# Patient Record
Sex: Male | Born: 1945 | Race: White | Hispanic: No | Marital: Married | Smoking: Former smoker
Health system: Southern US, Community
[De-identification: ages and names within clinical notes are randomized; demographics above are authoritative.]

## PROBLEM LIST (undated history)

## (undated) ENCOUNTER — Emergency Department (HOSPITAL_COMMUNITY): Payer: Medicare PPO

## (undated) DIAGNOSIS — C439 Malignant melanoma of skin, unspecified: Secondary | ICD-10-CM

## (undated) DIAGNOSIS — I10 Essential (primary) hypertension: Secondary | ICD-10-CM

## (undated) DIAGNOSIS — T7840XA Allergy, unspecified, initial encounter: Secondary | ICD-10-CM

## (undated) DIAGNOSIS — H409 Unspecified glaucoma: Secondary | ICD-10-CM

## (undated) HISTORY — PX: ESOPHAGOGASTRODUODENOSCOPY: SHX1529

## (undated) HISTORY — DX: Malignant melanoma of skin, unspecified: C43.9

## (undated) HISTORY — DX: Unspecified glaucoma: H40.9

## (undated) HISTORY — DX: Essential (primary) hypertension: I10

## (undated) HISTORY — PX: CATARACT EXTRACTION: SUR2

## (undated) HISTORY — DX: Allergy, unspecified, initial encounter: T78.40XA

---

## 2001-06-17 ENCOUNTER — Emergency Department (HOSPITAL_COMMUNITY): Admission: EM | Admit: 2001-06-17 | Discharge: 2001-06-18 | Payer: Self-pay

## 2003-08-12 ENCOUNTER — Encounter: Payer: Self-pay | Admitting: Gastroenterology

## 2003-08-12 ENCOUNTER — Ambulatory Visit (HOSPITAL_COMMUNITY): Admission: RE | Admit: 2003-08-12 | Discharge: 2003-08-12 | Payer: Self-pay | Admitting: Gastroenterology

## 2005-12-14 ENCOUNTER — Ambulatory Visit: Payer: Self-pay | Admitting: Gastroenterology

## 2005-12-26 ENCOUNTER — Ambulatory Visit: Payer: Self-pay | Admitting: Gastroenterology

## 2006-08-25 ENCOUNTER — Encounter: Payer: Self-pay | Admitting: Family Medicine

## 2007-06-07 ENCOUNTER — Ambulatory Visit: Payer: Self-pay | Admitting: Family Medicine

## 2007-06-07 DIAGNOSIS — I10 Essential (primary) hypertension: Secondary | ICD-10-CM | POA: Insufficient documentation

## 2007-06-07 DIAGNOSIS — J45909 Unspecified asthma, uncomplicated: Secondary | ICD-10-CM | POA: Insufficient documentation

## 2007-06-07 DIAGNOSIS — J309 Allergic rhinitis, unspecified: Secondary | ICD-10-CM | POA: Insufficient documentation

## 2007-08-22 ENCOUNTER — Ambulatory Visit: Payer: Self-pay | Admitting: Family Medicine

## 2007-08-22 LAB — CONVERTED CEMR LAB
Bilirubin Urine: NEGATIVE
Blood in Urine, dipstick: NEGATIVE
Glucose, Urine, Semiquant: NEGATIVE
Ketones, urine, test strip: NEGATIVE
Nitrite: NEGATIVE
Protein, U semiquant: NEGATIVE
Specific Gravity, Urine: 1.02
Urobilinogen, UA: 0.2
WBC Urine, dipstick: NEGATIVE
pH: 5.5

## 2007-08-24 LAB — CONVERTED CEMR LAB
ALT: 17 units/L (ref 0–53)
AST: 21 units/L (ref 0–37)
Albumin: 4 g/dL (ref 3.5–5.2)
Alkaline Phosphatase: 63 units/L (ref 39–117)
BUN: 18 mg/dL (ref 6–23)
Basophils Absolute: 0 10*3/uL (ref 0.0–0.1)
Basophils Relative: 0.5 % (ref 0.0–1.0)
Bilirubin, Direct: 0.2 mg/dL (ref 0.0–0.3)
CO2: 28 meq/L (ref 19–32)
Calcium: 9.6 mg/dL (ref 8.4–10.5)
Chloride: 108 meq/L (ref 96–112)
Cholesterol: 194 mg/dL (ref 0–200)
Creatinine, Ser: 0.8 mg/dL (ref 0.4–1.5)
Eosinophils Absolute: 0.2 10*3/uL (ref 0.0–0.6)
Eosinophils Relative: 4.2 % (ref 0.0–5.0)
GFR calc Af Amer: 126 mL/min
GFR calc non Af Amer: 104 mL/min
Glucose, Bld: 101 mg/dL — ABNORMAL HIGH (ref 70–99)
HCT: 41.7 % (ref 39.0–52.0)
HDL: 55.1 mg/dL (ref >39.0)
Hemoglobin: 14.3 g/dL (ref 13.0–17.0)
LDL Cholesterol: 129 mg/dL — ABNORMAL HIGH (ref 0–99)
Lymphocytes Relative: 32.1 % (ref 12.0–46.0)
MCHC: 34.4 g/dL (ref 30.0–36.0)
MCV: 93.4 fL (ref 78.0–100.0)
Monocytes Absolute: 0.6 10*3/uL (ref 0.2–0.7)
Monocytes Relative: 9.7 % (ref 3.0–11.0)
Neutro Abs: 3.1 10*3/uL (ref 1.4–7.7)
Neutrophils Relative %: 53.5 % (ref 43.0–77.0)
PSA: 0.19 ng/mL (ref 0.10–4.00)
Platelets: 185 10*3/uL (ref 150–400)
Potassium: 5.2 meq/L — ABNORMAL HIGH (ref 3.5–5.1)
RBC: 4.46 M/uL (ref 4.22–5.81)
RDW: 12.3 % (ref 11.5–14.6)
Sodium: 143 meq/L (ref 135–145)
TSH: 2.66 microintl units/mL (ref 0.35–5.50)
Total Bilirubin: 1 mg/dL (ref 0.3–1.2)
Total CHOL/HDL Ratio: 3.5
Total Protein: 6.3 g/dL (ref 6.0–8.3)
Triglycerides: 51 mg/dL (ref 0–149)
VLDL: 10 mg/dL (ref 0–40)
WBC: 5.8 10*3/uL (ref 4.5–10.5)

## 2007-09-19 ENCOUNTER — Ambulatory Visit: Payer: Self-pay | Admitting: Family Medicine

## 2008-03-06 ENCOUNTER — Ambulatory Visit: Payer: Self-pay | Admitting: Family Medicine

## 2008-03-06 DIAGNOSIS — IMO0002 Reserved for concepts with insufficient information to code with codable children: Secondary | ICD-10-CM | POA: Insufficient documentation

## 2008-03-12 ENCOUNTER — Telehealth: Payer: Self-pay | Admitting: Family Medicine

## 2008-03-26 ENCOUNTER — Ambulatory Visit: Payer: Self-pay | Admitting: Family Medicine

## 2008-03-26 ENCOUNTER — Telehealth: Payer: Self-pay | Admitting: Family Medicine

## 2008-09-18 ENCOUNTER — Ambulatory Visit: Payer: Self-pay | Admitting: Family Medicine

## 2009-01-21 ENCOUNTER — Telehealth: Payer: Self-pay | Admitting: Family Medicine

## 2009-06-23 ENCOUNTER — Ambulatory Visit: Payer: Self-pay | Admitting: Family Medicine

## 2009-06-23 DIAGNOSIS — S7000XA Contusion of unspecified hip, initial encounter: Secondary | ICD-10-CM

## 2009-06-23 DIAGNOSIS — IMO0002 Reserved for concepts with insufficient information to code with codable children: Secondary | ICD-10-CM | POA: Insufficient documentation

## 2009-09-14 ENCOUNTER — Ambulatory Visit: Payer: Self-pay | Admitting: Family Medicine

## 2009-09-14 LAB — CONVERTED CEMR LAB
Protein, U semiquant: NEGATIVE
Specific Gravity, Urine: 1.01
WBC Urine, dipstick: NEGATIVE

## 2009-09-16 LAB — CONVERTED CEMR LAB
ALT: 19 units/L (ref 0–53)
AST: 25 units/L (ref 0–37)
Albumin: 4.3 g/dL (ref 3.5–5.2)
Eosinophils Relative: 3.3 % (ref 0.0–5.0)
GFR calc non Af Amer: 103.64 mL/min (ref >60)
Glucose, Bld: 103 mg/dL — ABNORMAL HIGH (ref 70–99)
HCT: 44 % (ref 39.0–52.0)
Hemoglobin: 14.7 g/dL (ref 13.0–17.0)
LDL Cholesterol: 122 mg/dL — ABNORMAL HIGH (ref 0–99)
Lymphs Abs: 1.3 10*3/uL (ref 0.7–4.0)
Monocytes Relative: 10.4 % (ref 3.0–12.0)
Neutro Abs: 2.7 10*3/uL (ref 1.4–7.7)
Potassium: 4.3 meq/L (ref 3.5–5.1)
Sodium: 142 meq/L (ref 135–145)
TSH: 2.39 microintl units/mL (ref 0.35–5.50)
VLDL: 8.6 mg/dL (ref 0.0–40.0)
WBC: 4.7 10*3/uL (ref 4.5–10.5)

## 2009-09-18 ENCOUNTER — Ambulatory Visit: Payer: Self-pay | Admitting: Family Medicine

## 2009-09-18 ENCOUNTER — Telehealth: Payer: Self-pay | Admitting: Family Medicine

## 2009-11-11 ENCOUNTER — Ambulatory Visit: Payer: Self-pay | Admitting: Family Medicine

## 2009-11-11 DIAGNOSIS — L989 Disorder of the skin and subcutaneous tissue, unspecified: Secondary | ICD-10-CM | POA: Insufficient documentation

## 2009-11-13 ENCOUNTER — Telehealth: Payer: Self-pay | Admitting: Family Medicine

## 2009-11-30 ENCOUNTER — Ambulatory Visit: Payer: Self-pay | Admitting: Family Medicine

## 2009-11-30 DIAGNOSIS — C4370 Malignant melanoma of unspecified lower limb, including hip: Secondary | ICD-10-CM | POA: Insufficient documentation

## 2009-12-07 ENCOUNTER — Ambulatory Visit (HOSPITAL_COMMUNITY): Admission: RE | Admit: 2009-12-07 | Discharge: 2009-12-07 | Payer: Self-pay | Admitting: General Surgery

## 2009-12-07 ENCOUNTER — Encounter: Admission: RE | Admit: 2009-12-07 | Discharge: 2009-12-07 | Payer: Self-pay | Admitting: General Surgery

## 2009-12-08 ENCOUNTER — Ambulatory Visit (HOSPITAL_BASED_OUTPATIENT_CLINIC_OR_DEPARTMENT_OTHER): Admission: RE | Admit: 2009-12-08 | Discharge: 2009-12-08 | Payer: Self-pay | Admitting: General Surgery

## 2009-12-08 ENCOUNTER — Encounter: Payer: Self-pay | Admitting: Family Medicine

## 2009-12-08 HISTORY — PX: OTHER SURGICAL HISTORY: SHX169

## 2009-12-14 ENCOUNTER — Ambulatory Visit: Payer: Self-pay | Admitting: Oncology

## 2009-12-17 ENCOUNTER — Encounter: Payer: Self-pay | Admitting: Family Medicine

## 2010-01-21 ENCOUNTER — Ambulatory Visit (HOSPITAL_COMMUNITY): Admission: RE | Admit: 2010-01-21 | Discharge: 2010-01-21 | Payer: Self-pay | Admitting: General Surgery

## 2010-01-21 ENCOUNTER — Ambulatory Visit (HOSPITAL_BASED_OUTPATIENT_CLINIC_OR_DEPARTMENT_OTHER): Admission: RE | Admit: 2010-01-21 | Discharge: 2010-01-21 | Payer: Self-pay | Admitting: General Surgery

## 2010-01-21 HISTORY — PX: OTHER SURGICAL HISTORY: SHX169

## 2010-01-27 ENCOUNTER — Ambulatory Visit: Payer: Self-pay | Admitting: Family Medicine

## 2010-01-27 DIAGNOSIS — C4359 Malignant melanoma of other part of trunk: Secondary | ICD-10-CM | POA: Insufficient documentation

## 2010-02-16 ENCOUNTER — Ambulatory Visit: Payer: Self-pay | Admitting: Oncology

## 2010-02-18 ENCOUNTER — Encounter: Payer: Self-pay | Admitting: Family Medicine

## 2010-08-17 ENCOUNTER — Ambulatory Visit: Payer: Self-pay | Admitting: Oncology

## 2010-08-19 ENCOUNTER — Encounter: Payer: Self-pay | Admitting: Family Medicine

## 2010-09-14 ENCOUNTER — Ambulatory Visit: Payer: Self-pay | Admitting: Family Medicine

## 2010-09-14 LAB — CONVERTED CEMR LAB
Ketones, urine, test strip: NEGATIVE
Nitrite: NEGATIVE
Urobilinogen, UA: 0.2

## 2010-09-15 LAB — CONVERTED CEMR LAB
ALT: 19 units/L (ref 0–53)
AST: 23 units/L (ref 0–37)
Alkaline Phosphatase: 67 units/L (ref 39–117)
Basophils Relative: 0.8 % (ref 0.0–3.0)
Bilirubin, Direct: 0.1 mg/dL (ref 0.0–0.3)
Chloride: 101 meq/L (ref 96–112)
Creatinine, Ser: 0.9 mg/dL (ref 0.4–1.5)
Eosinophils Relative: 3.8 % (ref 0.0–5.0)
GFR calc non Af Amer: 85.77 mL/min (ref >60)
LDL Cholesterol: 128 mg/dL — ABNORMAL HIGH (ref 0–99)
Lymphocytes Relative: 27.7 % (ref 12.0–46.0)
MCV: 95.7 fL (ref 78.0–100.0)
Monocytes Absolute: 0.6 10*3/uL (ref 0.1–1.0)
Monocytes Relative: 9.9 % (ref 3.0–12.0)
Neutrophils Relative %: 57.8 % (ref 43.0–77.0)
RBC: 4.4 M/uL (ref 4.22–5.81)
Total Bilirubin: 0.9 mg/dL (ref 0.3–1.2)
Total CHOL/HDL Ratio: 3
Total Protein: 6.7 g/dL (ref 6.0–8.3)
Triglycerides: 51 mg/dL (ref 0.0–149.0)
VLDL: 10.2 mg/dL (ref 0.0–40.0)
WBC: 5.9 10*3/uL (ref 4.5–10.5)

## 2010-09-20 ENCOUNTER — Encounter: Payer: Self-pay | Admitting: Family Medicine

## 2010-09-20 ENCOUNTER — Ambulatory Visit: Payer: Self-pay | Admitting: Family Medicine

## 2010-11-28 LAB — CONVERTED CEMR LAB
Albumin: 4.1 g/dL (ref 3.5–5.2)
Alkaline Phosphatase: 60 units/L (ref 39–117)
BUN: 13 mg/dL (ref 6–23)
Basophils Relative: 0.7 % (ref 0.0–3.0)
Calcium: 9.9 mg/dL (ref 8.4–10.5)
Creatinine, Ser: 0.9 mg/dL (ref 0.4–1.5)
Eosinophils Relative: 1.5 % (ref 0.0–5.0)
GFR calc Af Amer: 110 mL/min
Glucose, Bld: 105 mg/dL — ABNORMAL HIGH (ref 70–99)
HCT: 41.5 % (ref 39.0–52.0)
Hemoglobin: 14.3 g/dL (ref 13.0–17.0)
Monocytes Absolute: 0.4 10*3/uL (ref 0.1–1.0)
Monocytes Relative: 8.8 % (ref 3.0–12.0)
Neutro Abs: 3.1 10*3/uL (ref 1.4–7.7)
PSA: 0.41 ng/mL (ref 0.10–4.00)
Potassium: 4.6 meq/L (ref 3.5–5.1)
RBC: 4.41 M/uL (ref 4.22–5.81)
Total CHOL/HDL Ratio: 2.9
Total Protein: 6.8 g/dL (ref 6.0–8.3)
WBC: 4.7 10*3/uL (ref 4.5–10.5)

## 2010-12-02 NOTE — Miscellaneous (Signed)
Summary: Consent to Special Procedure-Mole Removal  Consent to Special Procedure-Mole Removal   Imported By: Maryln Gottron 11/13/2009 15:15:13  _____________________________________________________________________  External Attachment:    Type:   Image     Comment:   External Document

## 2010-12-02 NOTE — Assessment & Plan Note (Signed)
Summary: mole removal/ok per cindy/njr/pt rescd from bump//ccm   Vital Signs:  Patient profile:   65 year old male Weight:      199 pounds Temp:     97.9 degrees F oral BP sitting:   154 / 106  (left arm) Cuff size:   regular  Vitals Entered By: Alfred Levins, CMA (November 11, 2009 2:56 PM) CC: remove mole from rt leg and lower back   History of Present Illness: Here to have 2 suspicious skin lesions excised, one on the back and one on the lower right leg. These were evaluated in 08-2009 during a routine cpx.   Allergies: 1)  ! * Eggs  Past History:  Past Medical History: Reviewed history from 09/19/2007 and no changes required. Allergic rhinitis (gets shots per Dr. Corinda Gubler) Asthma Hypertension  Past Surgical History: Reviewed history from 09/18/2009 and no changes required.  colonoscopy 2006 per Dr. Arlyce Dice, repeat in 10 yrs EGD with esophageal dilatation 2006 per Dr. Arlyce Dice  Review of Systems       as above  Physical Exam  General:  Well-developed,well-nourished,in no acute distress; alert,appropriate and cooperative throughout examination Skin:  2 lesions as noted previously   Impression & Recommendations:  Problem # 1:  SKIN LESIONS, MULTIPLE (ICD-709.9)  Orders: Excise lesion (TAL) 1.1 - 2.0 cm (11402) Excise lesion (TAL) 0.6 - 1.0 (11401)  Complete Medication List: 1)  Proair Hfa 108 (90 Base) Mcg/act Aers (Albuterol sulfate) .... 2 inh q4h as needed shortness of breath 2)  Micardis 40 Mg Tabs (Telmisartan) .Marland Kitchen.. 1 by mouth once daily 3)  Advair Diskus 250-50 Mcg/dose Misc (Fluticasone-salmeterol) .Marland Kitchen.. 1 puff 2 times daily  Patient Instructions: 1)  Informed consent was obtained. Both lesions were cleansed with Betadine, and then LA was obtained using 2% Lidocaine with epinephrine. First we focused on the back lesion . An elliptical incision was made around this lesion down to the subcutaneous fat layer. Total diameter of this area was 1.9 cm. The entire  lesion was excised and sent to Pathology. The wound was closed using 7 sutures of 3-0 Ethilon. Then we focused on the leg lesion. An eelliptical incision was made around the lesion down to the subcutaneous fat layer. Total diameter of the area was 0.9 cm. The entire lesion was excised and sent to Pathology. The wound was closed using 7 sutures of 4-0 Ethilon. Both wounds were dressed with Neosporin and gauze. Sutures are to be removed in 14 days.

## 2010-12-02 NOTE — Letter (Signed)
[facility]   Imported By: Maryln Gottron 03/08/2010 15:13:06  _____________________________________________________________________  External Attachment:    Type:   Image     Comment:   External Document

## 2010-12-02 NOTE — Letter (Signed)
Summary: Foye Deer MD  Foye Deer MD   Imported By: Lennie Odor 04/14/2010 16:39:19  _____________________________________________________________________  External Attachment:    Type:   Image     Comment:   External Document

## 2010-12-02 NOTE — Assessment & Plan Note (Signed)
Summary: med check/?med adjustment/refill/cjr   Vital Signs:  Patient profile:   65 year old male Weight:      192 pounds BMI:     29.52 Temp:     97.9 degrees F oral BP sitting:   180 / 100  (left arm) Cuff size:   regular  Vitals Entered By: Raechel Ache, RN (January 27, 2010 2:28 PM) CC: ROV- c/o elevated BP.   History of Present Illness: Here to discuss his HTN. he has had several surgeries in the past 2 months to remove melanomas, and needless to say this has been very stressful for him. Also he has not been able to exercise like he normally does. His BP at home has steadily gone up to the 170 range systolic. He denies any HA or chest pain or SOB.   Allergies: 1)  ! * Eggs  Past History:  Past Medical History: Allergic rhinitis (gets shots per Dr. Corinda Gubler) Asthma Hypertension malignant melanoma right lower leg, diagnosed on 11-11-09 sees Dr. Betsy Coder for Dermatology exams sees Dr. Kimberlee Nearing for Oncology  Past Surgical History:  colonoscopy 2006 per Dr. Arlyce Dice, repeat in 10 yrs EGD with esophageal dilatation 2006 per Dr. Arlyce Dice removal of melanoma from right calf on  12-08-09  per Dr. Sondra Come removal of melanoma from left abdomen on 01-21-10 per Dr. Lysle Morales  Review of Systems  The patient denies anorexia, fever, weight loss, weight gain, vision loss, decreased hearing, hoarseness, chest pain, syncope, dyspnea on exertion, peripheral edema, prolonged cough, headaches, hemoptysis, abdominal pain, melena, hematochezia, severe indigestion/heartburn, hematuria, incontinence, genital sores, muscle weakness, suspicious skin lesions, transient blindness, difficulty walking, depression, unusual weight change, abnormal bleeding, enlarged lymph nodes, angioedema, breast masses, and testicular masses.    Physical Exam  General:  Well-developed,well-nourished,in no acute distress; alert,appropriate and cooperative throughout examination Neck:  No  deformities, masses, or tenderness noted. Lungs:  Normal respiratory effort, chest expands symmetrically. Lungs are clear to auscultation, no crackles or wheezes. Heart:  Normal rate and regular rhythm. S1 and S2 normal without gallop, murmur, click, rub or other extra sounds.   Impression & Recommendations:  Problem # 1:  HYPERTENSION (ICD-401.9)  The following medications were removed from the medication list:    Micardis 40 Mg Tabs (Telmisartan) .Marland Kitchen... 1 by mouth once daily His updated medication list for this problem includes:    Micardis Hct 80-25 Mg Tabs (Telmisartan-hctz) ..... Once daily  Complete Medication List: 1)  Proair Hfa 108 (90 Base) Mcg/act Aers (Albuterol sulfate) .... 2 inh q4h as needed shortness of breath 2)  Advair Diskus 250-50 Mcg/dose Misc (Fluticasone-salmeterol) .Marland Kitchen.. 1 puff 2 times daily 3)  Micardis Hct 80-25 Mg Tabs (Telmisartan-hctz) .... Once daily  Patient Instructions: 1)  Please schedule a follow-up appointment in 1 month.  Prescriptions: MICARDIS HCT 80-25 MG TABS (TELMISARTAN-HCTZ) once daily  #30 x 5   Entered and Authorized by:   Nelwyn Salisbury MD   Signed by:   Nelwyn Salisbury MD on 01/27/2010   Method used:   Electronically to        [facility]  [place] [address] (retail)       [address]       [place], [place]  [address]       Ph: [phone] or [phone]       Fax: [phone]   RxID:   [id number]

## 2010-12-02 NOTE — Progress Notes (Signed)
Summary: melanoma path  Phone Note From Other Clinic   Caller: [facility], ? [address] [place],[phone] Call For: fry Summary of Call: Verbal diagnosis pathology: Right leg specimen is malignant melanoma, clark's level 4, measures 1.22mm.   Initial call taken by: Rudy Jew, RN,  November 13, 2009 3:36 PM  Follow-up for Phone Call        see full path report Follow-up by: Nelwyn Salisbury MD,  November 17, 2009 8:37 AM

## 2010-12-02 NOTE — Letter (Signed)
[facility]   Imported By: Maryln Gottron 01/11/2010 11:26:54  _____________________________________________________________________  External Attachment:    Type:   Image     Comment:   External Document

## 2010-12-02 NOTE — Assessment & Plan Note (Signed)
Summary: cpx/cjr   Vital Signs:  Patient profile:   65 year old male Height:      68 inches Weight:      187 pounds O2 Sat:      97 % Temp:     97.7 degrees F Pulse rate:   69 / minute BP sitting:   124 / 76  (left arm) Cuff size:   regular  Vitals Entered By: Pura Spice, RN (September 20, 2010 9:03 AM) CC: cpx no problems  Is Patient Diabetic? No   Contraindications/Deferment of Procedures/Staging:    Test/Procedure: FLU VAX    Reason for deferment: patient declined   History of Present Illness: 65 yr old male for a cpx. He feels fine and has no complaints. he still rides his bike some, although not as much as he did a year ago.   Allergies: 1)  ! * Eggs  Past History:  Past Medical History: Reviewed history from 01/27/2010 and no changes required. Allergic rhinitis (gets shots per Dr. Corinda Gubler) Asthma Hypertension malignant melanoma right lower leg, diagnosed on 11-11-09 sees Dr. Betsy Coder for Dermatology exams sees Dr. Kimberlee Nearing for Oncology  Past Surgical History: colonoscopy 2006 per Dr. Arlyce Dice, repeat in 10 yrs EGD with esophageal dilatation 2006 per Dr. Arlyce Dice removal of melanoma from right calf on  12-08-09  per Dr. Sondra Come removal of melanoma from left abdomen on 01-21-10 per Dr. Lysle Morales Cataract extraction, right eye, per Dr. Burgess Estelle  Family History: Reviewed history from 06/07/2007 and no changes required. Family History Hypertension  Social History: Reviewed history from 09/18/2008 and no changes required. Occupation: teaches history at [employer] Married Former Smoker Alcohol use-yes  Review of Systems  The patient denies anorexia, fever, weight loss, weight gain, vision loss, decreased hearing, hoarseness, chest pain, syncope, dyspnea on exertion, peripheral edema, prolonged cough, headaches, hemoptysis, abdominal pain, melena, hematochezia, severe indigestion/heartburn, hematuria, incontinence, genital  sores, muscle weakness, suspicious skin lesions, transient blindness, difficulty walking, depression, unusual weight change, abnormal bleeding, enlarged lymph nodes, angioedema, breast masses, and testicular masses.    Physical Exam  General:  overweight-appearing.   Head:  Normocephalic and atraumatic without obvious abnormalities. No apparent alopecia or balding. Eyes:  No corneal or conjunctival inflammation noted. EOMI. Perrla. Funduscopic exam benign, without hemorrhages, exudates or papilledema. Vision grossly normal. Ears:  External ear exam shows no significant lesions or deformities.  Otoscopic examination reveals clear canals, tympanic membranes are intact bilaterally without bulging, retraction, inflammation or discharge. Hearing is grossly normal bilaterally. Nose:  External nasal examination shows no deformity or inflammation. Nasal mucosa are pink and moist without lesions or exudates. Mouth:  Oral mucosa and oropharynx without lesions or exudates.  Teeth in good repair. Neck:  No deformities, masses, or tenderness noted. Chest Wall:  No deformities, masses, tenderness or gynecomastia noted. Lungs:  Normal respiratory effort, chest expands symmetrically. Lungs are clear to auscultation, no crackles or wheezes. Heart:  Normal rate and regular rhythm. S1 and S2 normal without gallop, murmur, click, rub or other extra sounds. EKG is normal  Abdomen:  Bowel sounds positive,abdomen soft and non-tender without masses, organomegaly or hernias noted. Rectal:  No external abnormalities noted. Normal sphincter tone. No rectal masses or tenderness. Heme neg.  Genitalia:  Testes bilaterally descended without nodularity, tenderness or masses. No scrotal masses or lesions. No penis lesions or urethral discharge. Prostate:  Prostate gland firm and smooth, no enlargement, nodularity, tenderness, mass, asymmetry or induration. Msk:  No deformity or  scoliosis noted of thoracic or lumbar spine.     Pulses:  R and L carotid,radial,femoral,dorsalis pedis and posterior tibial pulses are full and equal bilaterally Extremities:  No clubbing, cyanosis, edema, or deformity noted with normal full range of motion of all joints.   Neurologic:  No cranial nerve deficits noted. Station and gait are normal. Plantar reflexes are down-going bilaterally. DTRs are symmetrical throughout. Sensory, motor and coordinative functions appear intact. Skin:  Intact without suspicious lesions or rashes Cervical Nodes:  No lymphadenopathy noted Axillary Nodes:  No palpable lymphadenopathy Inguinal Nodes:  No significant adenopathy Psych:  Cognition and judgment appear intact. Alert and cooperative with normal attention span and concentration. No apparent delusions, illusions, hallucinations   Impression & Recommendations:  Problem # 1:  WELL ADULT EXAM (ICD-V70.0)  Orders: Hemoccult Guaiac-1 spec.(in office) (82270) EKG w/ Interpretation (93000)  Complete Medication List: 1)  Proair Hfa 108 (90 Base) Mcg/act Aers (Albuterol sulfate) .... 2 inh q4h as needed shortness of breath 2)  Advair Diskus 250-50 Mcg/dose Misc (Fluticasone-salmeterol) .Marland Kitchen.. 1 puff 2 times daily 3)  Micardis Hct 80-25 Mg Tabs (Telmisartan-hctz) .... Once daily  Other Orders: Tdap => 53yrs IM (14782) Admin 1st Vaccine (95621)  Patient Instructions: 1)  Please schedule a follow-up appointment in 6 months .  2)  It is important that you exercise reguarly at least 20 minutes 5 times a week. If you develop chest pain, have severe difficulty breathing, or feel very tired, stop exercising immediately and seek medical attention.  3)  You need to lose weight. Consider a lower calorie diet and regular exercise.    Orders Added: 1)  Tdap => 28yrs IM [90715] 2)  Admin 1st Vaccine [90471] 3)  Est. Patient 40-64 years [99396] 4)  Hemoccult Guaiac-1 spec.(in office) [82270] 5)  EKG w/ Interpretation [93000]   Immunizations  Administered:  Tetanus Vaccine:    Vaccine Type: Tdap    Site: left deltoid    Mfr: GlaxoSmithKline    Dose: 0.5 ml    Given by: Pura Spice, RN    Exp. Date: 08/19/2012    Lot #: [id number]    VIS given: 09/17/08 version given September 20, 2010.   Immunizations Administered:  Tetanus Vaccine:    Vaccine Type: Tdap    Site: left deltoid    Mfr: GlaxoSmithKline    Dose: 0.5 ml    Given by: Pura Spice, RN    Exp. Date: 08/19/2012    Lot #: [id number]    VIS given: 09/17/08 version given September 20, 2010.   Eye Exam  Dr Burgess Estelle --2011

## 2010-12-02 NOTE — Assessment & Plan Note (Signed)
Summary: stitches removal/njr   Vital Signs:  Patient profile:   65 year old male BP sitting:   174 / 92  (left arm) Cuff size:   large  Vitals Entered By: Alfred Levins, CMA (November 30, 2009 3:55 PM) CC: remove sutures   History of Present Illness: here to follow up after we removed two skin lesions on 11-11-09 and to remove sutures. The pathology for the lesion on the back showed some benign melanotic changes. the lesion on the right leg showed a Level 4 malignant melanoma. He was referred to Dr. Zachery Dakins, who saw him last week. he is scheduled for a wider excision from the leg along with a sentinal inguinal node biopsy per Dr. Zachery Dakins next week. he will be getting a CXR.   Allergies: 1)  ! * Eggs  Past History:  Past Medical History: Allergic rhinitis (gets shots per Dr. Corinda Gubler) Asthma Hypertension malignant melanoma right lower leg, diagnosed on 11-11-09  Past Surgical History: Reviewed history from 09/18/2009 and no changes required.  colonoscopy 2006 per Dr. Arlyce Dice, repeat in 10 yrs EGD with esophageal dilatation 2006 per Dr. Arlyce Dice  Review of Systems  The patient denies anorexia, fever, weight loss, weight gain, vision loss, decreased hearing, hoarseness, chest pain, syncope, dyspnea on exertion, peripheral edema, prolonged cough, headaches, hemoptysis, abdominal pain, melena, hematochezia, severe indigestion/heartburn, hematuria, incontinence, genital sores, muscle weakness, suspicious skin lesions, transient blindness, difficulty walking, depression, unusual weight change, abnormal bleeding, enlarged lymph nodes, angioedema, breast masses, and testicular masses.    Physical Exam  General:  Well-developed,well-nourished,in no acute distress; alert,appropriate and cooperative throughout examination Skin:  the wounds appear to be healing well.    Impression & Recommendations:  Problem # 1:  MELANOMA, LEG, RIGHT (ICD-172.7)  Problem # 2:  SKIN LESIONS, MULTIPLE  (ICD-709.9)  Complete Medication List: 1)  Proair Hfa 108 (90 Base) Mcg/act Aers (Albuterol sulfate) .... 2 inh q4h as needed shortness of breath 2)  Micardis 40 Mg Tabs (Telmisartan) .Marland Kitchen.. 1 by mouth once daily 3)  Advair Diskus 250-50 Mcg/dose Misc (Fluticasone-salmeterol) .Marland Kitchen.. 1 puff 2 times daily  Patient Instructions: 1)  Sutures were removed from the wound on the back. Per Dr. Annette Stable instructions, sutures were left in place in the right leg wound. To have surgery as above

## 2010-12-02 NOTE — Letter (Signed)
Summary: [facility]   Imported By: Maryln Gottron 09/21/2010 12:50:29  _____________________________________________________________________  External Attachment:    Type:   Image     Comment:   External Document

## 2011-01-20 LAB — DIFFERENTIAL
Basophils Absolute: 0 10*3/uL (ref 0.0–0.1)
Lymphocytes Relative: 26 % (ref 12–46)
Lymphs Abs: 1.6 10*3/uL (ref 0.7–4.0)
Monocytes Absolute: 0.5 10*3/uL (ref 0.1–1.0)
Monocytes Relative: 8 % (ref 3–12)
Neutro Abs: 3.9 10*3/uL (ref 1.7–7.7)

## 2011-01-20 LAB — COMPREHENSIVE METABOLIC PANEL
Albumin: 4.1 g/dL (ref 3.5–5.2)
BUN: 13 mg/dL (ref 6–23)
Calcium: 9.1 mg/dL (ref 8.4–10.5)
Creatinine, Ser: 0.83 mg/dL (ref 0.4–1.5)
GFR calc Af Amer: >60 mL/min (ref >60)
Total Bilirubin: 0.6 mg/dL (ref 0.3–1.2)
Total Protein: 6.8 g/dL (ref 6.0–8.3)

## 2011-01-20 LAB — CBC
HCT: 43.1 % (ref 39.0–52.0)
MCHC: 34.9 g/dL (ref 30.0–36.0)
MCV: 94.5 fL (ref 78.0–100.0)
Platelets: 173 10*3/uL (ref 150–400)
RDW: 13.7 % (ref 11.5–15.5)

## 2011-01-24 LAB — BASIC METABOLIC PANEL
BUN: 13 mg/dL (ref 6–23)
CO2: 28 mEq/L (ref 19–32)
Chloride: 107 mEq/L (ref 96–112)
Potassium: 4 mEq/L (ref 3.5–5.1)

## 2011-01-24 LAB — DIFFERENTIAL
Eosinophils Absolute: 0.1 10*3/uL (ref 0.0–0.7)
Eosinophils Relative: 2 % (ref 0–5)
Lymphs Abs: 1.4 10*3/uL (ref 0.7–4.0)
Monocytes Relative: 9 % (ref 3–12)

## 2011-01-24 LAB — CBC
HCT: 41.9 % (ref 39.0–52.0)
MCV: 95.2 fL (ref 78.0–100.0)
Platelets: 165 10*3/uL (ref 150–400)
WBC: 5.4 10*3/uL (ref 4.0–10.5)

## 2011-02-18 ENCOUNTER — Encounter (HOSPITAL_BASED_OUTPATIENT_CLINIC_OR_DEPARTMENT_OTHER): Payer: BC Managed Care – PPO | Admitting: Oncology

## 2011-02-18 DIAGNOSIS — C4370 Malignant melanoma of unspecified lower limb, including hip: Secondary | ICD-10-CM

## 2011-02-18 DIAGNOSIS — C4359 Malignant melanoma of other part of trunk: Secondary | ICD-10-CM

## 2011-02-23 ENCOUNTER — Ambulatory Visit (HOSPITAL_COMMUNITY)
Admission: RE | Admit: 2011-02-23 | Discharge: 2011-02-23 | Disposition: A | Discharge status: Home / Self Care | Payer: BC Managed Care – PPO | Source: Ambulatory Visit | Attending: Oncology | Admitting: Oncology

## 2011-02-23 ENCOUNTER — Other Ambulatory Visit (HOSPITAL_COMMUNITY): Payer: Self-pay | Admitting: Oncology

## 2011-02-23 DIAGNOSIS — J449 Chronic obstructive pulmonary disease, unspecified: Secondary | ICD-10-CM | POA: Insufficient documentation

## 2011-02-23 DIAGNOSIS — J4489 Other specified chronic obstructive pulmonary disease: Secondary | ICD-10-CM | POA: Insufficient documentation

## 2011-02-23 DIAGNOSIS — C439 Malignant melanoma of skin, unspecified: Secondary | ICD-10-CM | POA: Insufficient documentation

## 2011-03-22 ENCOUNTER — Other Ambulatory Visit: Payer: Self-pay | Admitting: *Deleted

## 2011-03-22 MED ORDER — FLUTICASONE-SALMETEROL 250-50 MCG/DOSE IN AEPB: 1.0000 | INHALATION_SPRAY | Freq: Two times a day (BID) | RESPIRATORY_TRACT | Status: DC

## 2011-03-31 ENCOUNTER — Emergency Department (HOSPITAL_COMMUNITY)
Admission: EM | Admit: 2011-03-31 | Discharge: 2011-04-01 | Disposition: A | Discharge status: Home / Self Care | Payer: BC Managed Care – PPO | Attending: Emergency Medicine | Admitting: Emergency Medicine

## 2011-03-31 DIAGNOSIS — J4489 Other specified chronic obstructive pulmonary disease: Secondary | ICD-10-CM | POA: Insufficient documentation

## 2011-03-31 DIAGNOSIS — J449 Chronic obstructive pulmonary disease, unspecified: Secondary | ICD-10-CM | POA: Insufficient documentation

## 2011-03-31 DIAGNOSIS — T63001A Toxic effect of unspecified snake venom, accidental (unintentional), initial encounter: Secondary | ICD-10-CM | POA: Insufficient documentation

## 2011-03-31 DIAGNOSIS — T6391XA Toxic effect of contact with unspecified venomous animal, accidental (unintentional), initial encounter: Secondary | ICD-10-CM | POA: Insufficient documentation

## 2011-03-31 DIAGNOSIS — Y92009 Unspecified place in unspecified non-institutional (private) residence as the place of occurrence of the external cause: Secondary | ICD-10-CM | POA: Insufficient documentation

## 2011-04-01 ENCOUNTER — Telehealth: Payer: Self-pay | Admitting: *Deleted

## 2011-04-01 NOTE — Telephone Encounter (Signed)
[facility] Triage Call Report Triage Record Num: [id number] Operator: Martie Lee Long Patient Name: Shannon Horton Call Date & Time: 03/31/2011 9:47:07PM Patient Phone: [phone] PCP: [name]. Clent Ridges Patient Gender: Male PCP Fax : [phone] Patient DOB: Jan 13, 1946 Practice Name: [facility] Reason for Call: [name]/Patient calling about being bitten by a cooperhead. Onset 03/31/11. Pt has tingling in his foot and redness. Instructed to go to ED now. Protocol(s) Used: Bites - Snake Recommended Outcome per Protocol: See ED Immediately Reason for Outcome: Redness, swelling or bruising at bite area Known poisonous snake and no symptoms Care Advice: ~ Another adult should drive. ~ DO NOT apply ice or tourniquet. ~ DO NOT give medication to control the pain. ~ IMMEDIATE ACTION 03/31/2011 10:07:46PM Page 1 of 1 [email or web address]

## 2011-07-11 ENCOUNTER — Other Ambulatory Visit: Payer: Self-pay | Admitting: Family Medicine

## 2011-08-12 ENCOUNTER — Other Ambulatory Visit: Payer: Self-pay | Admitting: Family Medicine

## 2011-09-12 ENCOUNTER — Other Ambulatory Visit: Payer: Medicare Other

## 2011-09-13 ENCOUNTER — Other Ambulatory Visit: Payer: BC Managed Care – PPO

## 2011-09-19 ENCOUNTER — Encounter: Payer: Self-pay | Admitting: Family Medicine

## 2011-09-20 ENCOUNTER — Encounter: Payer: Self-pay | Admitting: Family Medicine

## 2011-09-20 ENCOUNTER — Ambulatory Visit (INDEPENDENT_AMBULATORY_CARE_PROVIDER_SITE_OTHER): Payer: Medicare Other | Admitting: Family Medicine

## 2011-09-20 VITALS — BP 122/78 | HR 65 | Temp 98.5°F | Ht 68.0 in | Wt 185.0 lb

## 2011-09-20 DIAGNOSIS — N138 Other obstructive and reflux uropathy: Secondary | ICD-10-CM

## 2011-09-20 DIAGNOSIS — J45909 Unspecified asthma, uncomplicated: Secondary | ICD-10-CM

## 2011-09-20 DIAGNOSIS — Z136 Encounter for screening for cardiovascular disorders: Secondary | ICD-10-CM

## 2011-09-20 DIAGNOSIS — N401 Enlarged prostate with lower urinary tract symptoms: Secondary | ICD-10-CM

## 2011-09-20 DIAGNOSIS — I10 Essential (primary) hypertension: Secondary | ICD-10-CM

## 2011-09-20 DIAGNOSIS — Z85820 Personal history of malignant melanoma of skin: Secondary | ICD-10-CM

## 2011-09-20 DIAGNOSIS — N139 Obstructive and reflux uropathy, unspecified: Secondary | ICD-10-CM

## 2011-09-20 LAB — LIPID PANEL
Cholesterol: 220 mg/dL — ABNORMAL HIGH (ref 0–200)
Total CHOL/HDL Ratio: 4

## 2011-09-20 LAB — CBC WITH DIFFERENTIAL/PLATELET
Basophils Absolute: 0 10*3/uL (ref 0.0–0.1)
HCT: 45.2 % (ref 39.0–52.0)
Hemoglobin: 15.2 g/dL (ref 13.0–17.0)
Lymphs Abs: 1.5 10*3/uL (ref 0.7–4.0)
MCHC: 33.6 g/dL (ref 30.0–36.0)
Monocytes Relative: 8.8 % (ref 3.0–12.0)
Neutro Abs: 3.9 10*3/uL (ref 1.4–7.7)
RDW: 13.1 % (ref 11.5–14.6)

## 2011-09-20 LAB — POCT URINALYSIS DIPSTICK
Ketones, UA: NEGATIVE
Protein, UA: NEGATIVE
Spec Grav, UA: 1.02
pH, UA: 7

## 2011-09-20 LAB — HEPATIC FUNCTION PANEL
ALT: 22 U/L (ref 0–53)
AST: 22 U/L (ref 0–37)
Bilirubin, Direct: 0.1 mg/dL (ref 0.0–0.3)
Total Bilirubin: 0.9 mg/dL (ref 0.3–1.2)

## 2011-09-20 LAB — BASIC METABOLIC PANEL
CO2: 30 mEq/L (ref 19–32)
Chloride: 103 mEq/L (ref 96–112)
Sodium: 142 mEq/L (ref 135–145)

## 2011-09-20 LAB — TSH: TSH: 3.03 u[IU]/mL (ref 0.35–5.50)

## 2011-09-20 NOTE — Progress Notes (Signed)
  Subjective:    Patient ID: Shannon Horton, male    DOB: [birth year], 65 y.o.   MRN: [id number]  HPI 65 yr old male for a cpx. He feels well and has no concerns. He recently had good reports from Dr. Sharyn Lull, Dr. Zachery Dakins, and Dr. Corinda Gubler. He stays active, and he recently started rowing with a skull he purchased in addition to his bicycle riding. He has stopped eating all meats except for fish, which he eats twice a week.    Review of Systems  Constitutional: Negative.   HENT: Negative.   Eyes: Negative.   Respiratory: Negative.   Cardiovascular: Negative.   Gastrointestinal: Negative.   Genitourinary: Negative.   Musculoskeletal: Negative.   Skin: Negative.   Neurological: Negative.   Hematological: Negative.   Psychiatric/Behavioral: Negative.        Objective:   Physical Exam  Constitutional: He is oriented to person, place, and time. He appears well-developed and well-nourished. No distress.  HENT:  Head: Normocephalic and atraumatic.  Right Ear: External ear normal.  Left Ear: External ear normal.  Nose: Nose normal.  Mouth/Throat: Oropharynx is clear and moist. No oropharyngeal exudate.  Eyes: Conjunctivae and EOM are normal. Pupils are equal, round, and reactive to light. Right eye exhibits no discharge. Left eye exhibits no discharge. No scleral icterus.  Neck: Neck supple. No JVD present. No tracheal deviation present. No thyromegaly present.  Cardiovascular: Normal rate, regular rhythm, normal heart sounds and intact distal pulses.  Exam reveals no gallop and no friction rub.   No murmur heard.      EKG normal   Pulmonary/Chest: Effort normal and breath sounds normal. No respiratory distress. He has no wheezes. He has no rales. He exhibits no tenderness.  Abdominal: Soft. Bowel sounds are normal. He exhibits no distension and no mass. There is no tenderness. There is no rebound and no guarding.  Genitourinary: Rectum normal, prostate normal and penis normal.  Guaiac negative stool. No penile tenderness.  Musculoskeletal: Normal range of motion. He exhibits no edema and no tenderness.  Lymphadenopathy:    He has no cervical adenopathy.  Neurological: He is alert and oriented to person, place, and time. He has normal reflexes. No cranial nerve deficit. He exhibits normal muscle tone. Coordination normal.  Skin: Skin is warm and dry. No rash noted. He is not diaphoretic. No erythema. No pallor.  Psychiatric: He has a normal mood and affect. His behavior is normal. Judgment and thought content normal.          Assessment & Plan:  Well exam. Get fasting labs today

## 2011-09-28 NOTE — Progress Notes (Signed)
Quick Note:  Left voice message ______ 

## 2011-09-30 ENCOUNTER — Telehealth: Payer: Self-pay | Admitting: Family Medicine

## 2011-09-30 NOTE — Telephone Encounter (Signed)
Pt requesting a phone call to discuss lab results. Please call [phone] ext [phone].

## 2011-10-10 NOTE — Telephone Encounter (Signed)
I have tried to reach pt by phone and can only leave a voice message. I put a copy of lab results in mail today.

## 2011-10-18 ENCOUNTER — Ambulatory Visit (INDEPENDENT_AMBULATORY_CARE_PROVIDER_SITE_OTHER): Payer: Medicare Other | Admitting: Family Medicine

## 2011-10-18 ENCOUNTER — Encounter: Payer: Self-pay | Admitting: Family Medicine

## 2011-10-18 VITALS — BP 130/86 | HR 59 | Temp 98.5°F | Wt 183.0 lb

## 2011-10-18 DIAGNOSIS — J40 Bronchitis, not specified as acute or chronic: Secondary | ICD-10-CM

## 2011-10-18 MED ORDER — AZITHROMYCIN 250 MG PO TABS: ORAL_TABLET | ORAL | Status: AC

## 2011-10-18 NOTE — Progress Notes (Signed)
  Subjective:    Patient ID: Shannon Horton, male    DOB: [birth year], 65 y.o.   MRN: [id number]  HPI Here for one week of chest tightness and coughing up green sputum. No fever    Review of Systems  Constitutional: Negative.   HENT: Negative.   Eyes: Negative.   Respiratory: Positive for cough.        Objective:   Physical Exam  Constitutional: He appears well-developed and well-nourished.  HENT:  Right Ear: External ear normal.  Left Ear: External ear normal.  Nose: Nose normal.  Mouth/Throat: Oropharynx is clear and moist. No oropharyngeal exudate.  Eyes: Conjunctivae are normal.  Pulmonary/Chest: Effort normal. He has no wheezes. He has no rales.       Scattered rhonchi   Lymphadenopathy:    He has no cervical adenopathy.          Assessment & Plan:  Add Mucinex

## 2011-11-01 HISTORY — PX: SQUAMOUS CELL CARCINOMA EXCISION: SHX2433

## 2012-02-02 ENCOUNTER — Telehealth: Payer: Self-pay | Admitting: Oncology

## 2012-02-02 NOTE — Telephone Encounter (Signed)
s/w pt r/s 4/15 appt to 5/6      aom

## 2012-02-10 ENCOUNTER — Other Ambulatory Visit: Payer: Self-pay | Admitting: Family Medicine

## 2012-02-13 ENCOUNTER — Ambulatory Visit: Payer: BC Managed Care – PPO | Admitting: Oncology

## 2012-03-05 ENCOUNTER — Encounter: Payer: Self-pay | Admitting: Oncology

## 2012-03-05 ENCOUNTER — Ambulatory Visit (HOSPITAL_BASED_OUTPATIENT_CLINIC_OR_DEPARTMENT_OTHER): Payer: Medicare Other | Admitting: Oncology

## 2012-03-05 ENCOUNTER — Telehealth: Payer: Self-pay | Admitting: Oncology

## 2012-03-05 VITALS — BP 167/91 | HR 58 | Temp 98.2°F | Ht 68.0 in | Wt 189.7 lb

## 2012-03-05 DIAGNOSIS — C4370 Malignant melanoma of unspecified lower limb, including hip: Secondary | ICD-10-CM

## 2012-03-05 DIAGNOSIS — C4359 Malignant melanoma of other part of trunk: Secondary | ICD-10-CM

## 2012-03-05 DIAGNOSIS — I10 Essential (primary) hypertension: Secondary | ICD-10-CM

## 2012-03-05 NOTE — Progress Notes (Signed)
This office note has been dictated.    #[id number]

## 2012-03-05 NOTE — Progress Notes (Signed)
CC:   [name]. Clent Ridges, MD Betsy Coder, MD  PROBLEM LIST: 1. Melanoma involving the right leg measuring 1.10 mm with no     ulceration.  Biopsy was carried out on 11/11/2009 with wide     excision and sentinel lymph nodes removed from the right groin, all     of which were negative on 12/08/2009.  Tumor stage was T2a N0, IB. 2. Malignant melanoma excised from left lateral upper abdominal wall     with biopsy on 12/23/2009 and re-excision sentinel lymph node     biopsy on 01/21/2010.  There was no ulceration.  Tumor was 1.10 mm.     Tumor stage was pT2a N0, IB. 3. Hypertension. 4. Asthma. 5. Seasonal allergies.  MEDICATIONS: 1. Albuterol inhaler 2 puffs every 6 hours as needed. 2. Advair Diskus 250-50. 3. Micardis/HCT 80/25 one tablet daily.  HISTORY:  Shannon Horton was seen today for followup of 2 thin melanomas, 1 removed from the right leg and the other from the left lateral upper abdominal wall, both with negative lymph nodes.  These lesions go back a little more than 2 years.  The patient remains without evidence of recurrence.  He sees Dr. Sharyn Lull for skin exam every 6 months and recently saw her a couple months ago.  His general health has been stable without any symptoms to suggest recurrent melanoma.  He feels fine.  Stays in excellent shape by rowing and biking.  Shannon Horton tells me that his brother died in 10/29/2023 apparently related to treatment concerning cancer.  His mother recently was diagnosed with brain metastases, possibly from lung cancer.  Chuck's father died 4 years ago.  His mother is 62 years old, lives in [address]  PHYSICAL EXAMINATION:  Shannon Horton looks well.  He may be a little overweight. Weight is 189.7 pounds, height 5 feet 8 inches, body surface area 2.0 meters squared.  Blood pressure 167/91.  Other vital signs are normal. There is no scleral icterus.  Mouth and pharynx benign.  No peripheral adenopathy palpable in any of the lymph node  groups.  Heart and lungs are normal.  Abdomen is benign.  Extremities no peripheral edema or clubbing.  No axillary or inguinal adenopathy.  All of his incisions have healed well with no evidence for recurrent melanoma.  I did not see any obvious melanomas or skin cancers.  Apparently he has had some precancerous lesions treated with 5-FU ointment involving his scalp. Neurologic exam is normal.  LABORATORY DATA:  None was carried out today.  Laboratory data from 09/20/2011, CBC and chemistries were normal except for a glucose of 120.  IMAGING STUDIES: 1. Chest x-ray, 2 view, from 02/23/2011 showed no acute     cardiopulmonary disease.  COPD was noted with some hyperinflation     and increased AP diameter of the chest. 2. Chest x-ray, PA and lateral, has been ordered and will be carried     out within the next week or 2.  IMPRESSION AND PLAN:  Shannon Horton continues to do well, now 2 years from the time of diagnosis of his 2 thin melanomas.  Prognosis is very favorable. Chest x-ray has been ordered.  I would suggest yearly chest x-rays at least for a while.  I have not ordered any labs for this visit.  I have asked Shannon Horton to return in 1 year.  Labs can be obtained through his primary physician, Dr. Clent Ridges.    ______________________________ Samul Dada, M.D. DSM/MEDQ  D:  03/05/2012  T:  03/05/2012  Job:  [id number]

## 2012-03-05 NOTE — Telephone Encounter (Signed)
Gv pt copy CXR orders for [id number].  gv pt appt for [id number]

## 2012-03-07 ENCOUNTER — Ambulatory Visit (HOSPITAL_COMMUNITY)
Admission: RE | Admit: 2012-03-07 | Discharge: 2012-03-07 | Disposition: A | Discharge status: Home / Self Care | Payer: Medicare Other | Source: Ambulatory Visit | Attending: Oncology | Admitting: Oncology

## 2012-03-07 ENCOUNTER — Telehealth: Payer: Self-pay | Admitting: Nurse Practitioner

## 2012-03-07 DIAGNOSIS — C4359 Malignant melanoma of other part of trunk: Secondary | ICD-10-CM | POA: Insufficient documentation

## 2012-03-07 DIAGNOSIS — C4370 Malignant melanoma of unspecified lower limb, including hip: Secondary | ICD-10-CM | POA: Insufficient documentation

## 2012-03-07 NOTE — Progress Notes (Signed)
Quick Note:  Please notify patient and call/fax these results to patient's doctors. ______ 

## 2012-03-07 NOTE — Telephone Encounter (Signed)
Informed Ms. Shannon Horton- Shannon Horton's chest xray came back ok.  Wife verbalized understanding.

## 2012-07-02 ENCOUNTER — Other Ambulatory Visit: Payer: Self-pay | Admitting: Family Medicine

## 2012-08-28 ENCOUNTER — Other Ambulatory Visit: Payer: Self-pay | Admitting: Family Medicine

## 2012-09-20 ENCOUNTER — Ambulatory Visit (INDEPENDENT_AMBULATORY_CARE_PROVIDER_SITE_OTHER): Payer: Medicare Other | Admitting: Family Medicine

## 2012-09-20 ENCOUNTER — Encounter: Payer: Self-pay | Admitting: Family Medicine

## 2012-09-20 VITALS — BP 136/88 | HR 59 | Temp 98.3°F | Ht 67.75 in | Wt 184.0 lb

## 2012-09-20 DIAGNOSIS — N139 Obstructive and reflux uropathy, unspecified: Secondary | ICD-10-CM

## 2012-09-20 DIAGNOSIS — I10 Essential (primary) hypertension: Secondary | ICD-10-CM

## 2012-09-20 DIAGNOSIS — N401 Enlarged prostate with lower urinary tract symptoms: Secondary | ICD-10-CM

## 2012-09-20 DIAGNOSIS — N138 Other obstructive and reflux uropathy: Secondary | ICD-10-CM

## 2012-09-20 DIAGNOSIS — J45909 Unspecified asthma, uncomplicated: Secondary | ICD-10-CM

## 2012-09-20 LAB — CBC WITH DIFFERENTIAL/PLATELET
Basophils Absolute: 0 10*3/uL (ref 0.0–0.1)
Hemoglobin: 15.3 g/dL (ref 13.0–17.0)
Lymphocytes Relative: 23.3 % (ref 12.0–46.0)
Monocytes Relative: 8.4 % (ref 3.0–12.0)
Neutro Abs: 4 10*3/uL (ref 1.4–7.7)
RDW: 13 % (ref 11.5–14.6)

## 2012-09-20 LAB — HEPATIC FUNCTION PANEL
ALT: 21 U/L (ref 0–53)
Bilirubin, Direct: 0.1 mg/dL (ref 0.0–0.3)
Total Bilirubin: 0.9 mg/dL (ref 0.3–1.2)

## 2012-09-20 LAB — POCT URINALYSIS DIPSTICK
Bilirubin, UA: NEGATIVE
Glucose, UA: NEGATIVE
Ketones, UA: NEGATIVE
Leukocytes, UA: NEGATIVE
Spec Grav, UA: 1.015

## 2012-09-20 LAB — TSH: TSH: 2.55 u[IU]/mL (ref 0.35–5.50)

## 2012-09-20 LAB — BASIC METABOLIC PANEL
CO2: 32 mEq/L (ref 19–32)
Calcium: 9.9 mg/dL (ref 8.4–10.5)
GFR: 112.33 mL/min (ref >60.00)
Sodium: 140 mEq/L (ref 135–145)

## 2012-09-20 LAB — LIPID PANEL
HDL: 54.1 mg/dL (ref >39.00)
LDL Cholesterol: 125 mg/dL — ABNORMAL HIGH (ref 0–99)
Total CHOL/HDL Ratio: 4

## 2012-09-20 LAB — PSA: PSA: 0.62 ng/mL (ref 0.10–4.00)

## 2012-09-20 MED ORDER — TELMISARTAN-HCTZ 80-25 MG PO TABS: 1.0000 | ORAL_TABLET | Freq: Every day | ORAL | Status: DC

## 2012-09-20 MED ORDER — ALBUTEROL SULFATE HFA 108 (90 BASE) MCG/ACT IN AERS: 2.0000 | INHALATION_SPRAY | RESPIRATORY_TRACT | Status: DC | PRN

## 2012-09-20 MED ORDER — FLUTICASONE-SALMETEROL 250-50 MCG/DOSE IN AEPB: 1.0000 | INHALATION_SPRAY | Freq: Two times a day (BID) | RESPIRATORY_TRACT | Status: DC

## 2012-09-20 NOTE — Progress Notes (Signed)
  Subjective:    Patient ID: Shannon Horton, male    DOB: Feb 11, 1946, 66 y.o.   MRN: [id number]  HPI 66 yr old male for a cpx. He feels well and has no complaints.    Review of Systems  Constitutional: Negative.   HENT: Negative.   Eyes: Negative.   Respiratory: Negative.   Cardiovascular: Negative.   Gastrointestinal: Negative.   Genitourinary: Negative.   Musculoskeletal: Negative.   Skin: Negative.   Neurological: Negative.   Hematological: Negative.   Psychiatric/Behavioral: Negative.        Objective:   Physical Exam  Constitutional: He is oriented to person, place, and time. He appears well-developed and well-nourished. No distress.  HENT:  Head: Normocephalic and atraumatic.  Right Ear: External ear normal.  Left Ear: External ear normal.  Nose: Nose normal.  Mouth/Throat: Oropharynx is clear and moist. No oropharyngeal exudate.  Eyes: Conjunctivae normal and EOM are normal. Pupils are equal, round, and reactive to light. Right eye exhibits no discharge. Left eye exhibits no discharge. No scleral icterus.  Neck: Neck supple. No JVD present. No tracheal deviation present. No thyromegaly present.  Cardiovascular: Normal rate, regular rhythm, normal heart sounds and intact distal pulses.  Exam reveals no gallop and no friction rub.   No murmur heard.      EKG normal   Pulmonary/Chest: Effort normal and breath sounds normal. No respiratory distress. He has no wheezes. He has no rales. He exhibits no tenderness.  Abdominal: Soft. Bowel sounds are normal. He exhibits no distension and no mass. There is no tenderness. There is no rebound and no guarding.  Genitourinary: Rectum normal, prostate normal and penis normal. Guaiac negative stool. No penile tenderness.  Musculoskeletal: Normal range of motion. He exhibits no edema and no tenderness.  Lymphadenopathy:    He has no cervical adenopathy.  Neurological: He is alert and oriented to person, place, and time. He has  normal reflexes. No cranial nerve deficit. He exhibits normal muscle tone. Coordination normal.  Skin: Skin is warm and dry. No rash noted. He is not diaphoretic. No erythema. No pallor.  Psychiatric: He has a normal mood and affect. His behavior is normal. Judgment and thought content normal.          Assessment & Plan:  Well exam. Get fasting labs

## 2012-09-24 NOTE — Progress Notes (Signed)
Quick Note:  I left voice message with results. ______ 

## 2012-11-11 ENCOUNTER — Other Ambulatory Visit: Payer: Self-pay | Admitting: Family Medicine

## 2013-01-08 ENCOUNTER — Telehealth: Payer: Self-pay | Admitting: Family Medicine

## 2013-01-08 MED ORDER — TELMISARTAN-HCTZ 80-25 MG PO TABS: 1.0000 | ORAL_TABLET | Freq: Every day | ORAL | Status: DC

## 2013-01-08 NOTE — Telephone Encounter (Signed)
Refill request for Telmisartan-HCTZ and a 90 Akasha Melena supply, which I did send e-scribe

## 2013-02-11 ENCOUNTER — Encounter: Payer: Self-pay | Admitting: Family Medicine

## 2013-02-11 ENCOUNTER — Ambulatory Visit (INDEPENDENT_AMBULATORY_CARE_PROVIDER_SITE_OTHER): Payer: Medicare PPO | Admitting: Family Medicine

## 2013-02-11 VITALS — BP 140/88 | HR 67 | Temp 98.1°F | Wt 186.0 lb

## 2013-02-11 DIAGNOSIS — R590 Localized enlarged lymph nodes: Secondary | ICD-10-CM

## 2013-02-11 DIAGNOSIS — R599 Enlarged lymph nodes, unspecified: Secondary | ICD-10-CM

## 2013-02-11 LAB — CBC WITH DIFFERENTIAL/PLATELET
Basophils Relative: 0.5 % (ref 0.0–3.0)
Eosinophils Relative: 1.5 % (ref 0.0–5.0)
HCT: 44.8 % (ref 39.0–52.0)
Hemoglobin: 14.7 g/dL (ref 13.0–17.0)
Lymphs Abs: 1.6 10*3/uL (ref 0.7–4.0)
Monocytes Relative: 9.3 % (ref 3.0–12.0)
Neutro Abs: 5.1 10*3/uL (ref 1.4–7.7)
RBC: 4.71 Mil/uL (ref 4.22–5.81)
RDW: 13.4 % (ref 11.5–14.6)
WBC: 7.6 10*3/uL (ref 4.5–10.5)

## 2013-02-11 MED ORDER — AMOXICILLIN-POT CLAVULANATE 875-125 MG PO TABS: 1.0000 | ORAL_TABLET | Freq: Two times a day (BID) | ORAL | Status: DC

## 2013-02-11 NOTE — Progress Notes (Signed)
  Subjective:    Patient ID: Shannon Jacquet., male    DOB: 03/20/1946, 67 y.o.   MRN: [id number]  HPI Here to check a slightly tender lump in the left anterior neck that suddenly appeared 2 days ago. Otherwise he feels fine. No recent fevers or ST. No dental pain. No recent dental cleanings or procedures. Advil helps.    Review of Systems  Constitutional: Negative.   HENT: Negative for congestion, sore throat, trouble swallowing, neck pain, neck stiffness, dental problem, voice change, postnasal drip and sinus pressure.   Eyes: Negative.   Respiratory: Negative.   Hematological: Positive for adenopathy.       Objective:   Physical Exam  Constitutional: He appears well-developed and well-nourished.  HENT:  Head: Normocephalic and atraumatic.  Right Ear: External ear normal.  Left Ear: External ear normal.  Mouth/Throat: Oropharynx is clear and moist. No oropharyngeal exudate.  Eyes: Pupils are equal, round, and reactive to light.  Neck: Neck supple. No thyromegaly present.  Single large nontender left anterior cervical node about 2 cm below the mandible           Assessment & Plan:  Single neck node. Check a CBC, cover with Augmentin. He will follow up in one week

## 2013-02-15 ENCOUNTER — Encounter: Payer: Self-pay | Admitting: Family Medicine

## 2013-02-18 ENCOUNTER — Encounter: Payer: Self-pay | Admitting: Family Medicine

## 2013-02-18 NOTE — Progress Notes (Signed)
Quick Note:  I released lab results in my chart. ______

## 2013-02-20 ENCOUNTER — Encounter: Payer: Self-pay | Admitting: Family Medicine

## 2013-02-20 ENCOUNTER — Ambulatory Visit (INDEPENDENT_AMBULATORY_CARE_PROVIDER_SITE_OTHER): Payer: Medicare PPO | Admitting: Family Medicine

## 2013-02-20 VITALS — BP 138/80 | HR 66 | Temp 98.3°F | Wt 190.0 lb

## 2013-02-20 DIAGNOSIS — R599 Enlarged lymph nodes, unspecified: Secondary | ICD-10-CM

## 2013-02-20 DIAGNOSIS — R590 Localized enlarged lymph nodes: Secondary | ICD-10-CM

## 2013-02-20 NOTE — Progress Notes (Signed)
  Subjective:    Patient ID: Shannon Horton., male    DOB: August 30, 1946, 67 y.o.   MRN: [id number]  HPI Here to follow up on an enlarged lymph node in the left anterior neck. We saw him on 02-11-13 and placed him on Augmentin. He says the node dramatically shrank after only 2 days of this, and now it is totally back to normal. He feels fine.    Review of Systems  Constitutional: Negative.   HENT: Negative.   Hematological: Negative.        Objective:   Physical Exam  Constitutional: He appears well-developed and well-nourished.  HENT:  Right Ear: External ear normal.  Left Ear: External ear normal.  Nose: Nose normal.  Mouth/Throat: Oropharynx is clear and moist. No oropharyngeal exudate.  Eyes: Conjunctivae are normal. Pupils are equal, round, and reactive to light.  Neck: Neck supple. No thyromegaly present.  Lymphadenopathy:    He has no cervical adenopathy.          Assessment & Plan:  Benign lymphadenopathy which has resolved. Recheck prn

## 2013-02-20 NOTE — Telephone Encounter (Signed)
Pt coming in today for office visit, will try to update at that time.

## 2013-03-05 ENCOUNTER — Ambulatory Visit (HOSPITAL_BASED_OUTPATIENT_CLINIC_OR_DEPARTMENT_OTHER): Payer: Medicare PPO | Admitting: Oncology

## 2013-03-05 ENCOUNTER — Encounter: Payer: Self-pay | Admitting: Oncology

## 2013-03-05 VITALS — BP 141/84 | HR 97 | Temp 97.4°F | Resp 18 | Ht 67.75 in | Wt 194.5 lb

## 2013-03-05 DIAGNOSIS — C4370 Malignant melanoma of unspecified lower limb, including hip: Secondary | ICD-10-CM

## 2013-03-05 DIAGNOSIS — C4371 Malignant melanoma of right lower limb, including hip: Secondary | ICD-10-CM

## 2013-03-05 DIAGNOSIS — C4359 Malignant melanoma of other part of trunk: Secondary | ICD-10-CM

## 2013-03-05 NOTE — Progress Notes (Signed)
This office note has been dictated.  #[id number]

## 2013-03-06 NOTE — Progress Notes (Signed)
CC:   Shannon Horton. Shannon Ridges, MD Shannon Coder, MD  PROBLEM LIST:  1. Melanoma involving the right leg measuring 1.10 mm with no  ulceration. Biopsy was carried out on 11/11/2009 with wide  excision and sentinel lymph nodes removed from the right groin, all  of which were negative on 12/08/2009. Tumor stage was T2a N0, IB.  2. Malignant melanoma excised from left lateral upper abdominal wall  with biopsy on 12/23/2009 and re-excision sentinel lymph node  biopsy on 01/21/2010. There was no ulceration. Tumor was 1.10 mm.  Tumor stage was pT2a N0, IB.  3. Hypertension.  4. Asthma.  5. Seasonal allergies. 6. 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   MEDICATIONS:  Reviewed and recorded. Current Outpatient Prescriptions  Medication Sig Dispense Refill  . albuterol (PROVENTIL HFA;VENTOLIN HFA) 108 (90 BASE) MCG/ACT inhaler Inhale 2 puffs into the lungs every 4 (four) hours as needed for wheezing or shortness of breath.  1 Inhaler  11  . Fluticasone-Salmeterol (ADVAIR DISKUS) 250-50 MCG/DOSE AEPB Inhale 1 puff into the lungs 2 (two) times daily.  60 each  11  . Multiple Vitamin (MULTIVITAMIN) tablet Take 1 tablet by mouth daily. One A Day      . telmisartan-hydrochlorothiazide (MICARDIS HCT) 80-25 MG per tablet Take 1 tablet by mouth daily.  90 tablet  1  . UNABLE TO FIND Allergy shots at [facility] every other week       No current facility-administered medications for this visit.    SMOKING HISTORY:  Patient smoked a half a pack to 1 pack of cigarettes a day for about 20 years.  He stopped smoking in 1992.   HISTORY:  I saw Shannon Horton today for followup of his 2 thin melanomas, 1 from the right leg and the other from the left lateral upper abdominal wall, both with negative lymph nodes.  These lesions were diagnosed back in early 2011, i.e., about  3-1/2 years ago.  The patient sees Dr. Sharyn Lull for skin exams every 6 months.  The patient's health has been quite good.  He tells me that about a month ago he had a lump in his left submandibular area, possibly a swollen left submandibular gland or lymph node, which was treated with antibiotics with resolution.  This lesion apparently came up overnight and was essentially asymptomatic. The patient has no complaints today, he feels generally well and has no symptoms to suggest recurrence of his melanomas.  PHYSICAL EXAMINATION:  General:  He looks well.  Weight is 194-1/2 pounds, height 5 feet 8 inches, body surface area 2.05 sq m.  Vital Signs:  Blood pressure 141/84.  Other vital signs are normal.  O2 saturation on room air at rest was 97%.  HEENT:  There is no scleral icterus.  Mouth and pharynx are benign.  Lymph nodes:  No peripheral adenopathy palpable in the neck, supraclavicular, axillary, or inguinal areas.  Heart and lungs:  Normal.  Abdomen:  Benign.  I thought I could feel perhaps a soft right lobe of liver descending at about the anterior axillary line.  No splenomegaly, abdominal masses, or ascites. Extremities:  No peripheral edema or clubbing.  The areas of both of his melanomas, as well as his scalp, look perfectly clean without local recurrence.  Neurologic:  Exam was normal.  LABORATORY DATA:  No lab data was carried out today.  The  patient has a normal CBC from 02/11/2013 and normal chemistries from 09/20/2012.  IMAGING STUDIES:  1. Chest x-ray, 2 view, from 02/23/2011 showed no acute  cardiopulmonary disease. COPD was noted with some hyperinflation  and increased AP diameter of the chest. 2. Chest x-ray, 2 view, from 03/07/2012 showed no active cardiopulmonary disease.  IMPRESSION AND PLAN:  Shannon Horton continues to do well, now approaching 3-1/2 years from the time of diagnosis of his 2 thin melanomas.  Prognosis is very favorable.  I have ordered another chest  x-ray to be carried out either today or in the next few days.  At this point, I do not think that [facility] needs ongoing followup through this office.  Certainly, I would be happy to see him should any questions or problems arise in the future.  It has been a pleasure to participate in his care.    ______________________________ Samul Dada, M.D. DSM/MEDQ  D:  03/05/2013  T:  03/06/2013  Job:  [id number]

## 2013-03-11 ENCOUNTER — Ambulatory Visit (HOSPITAL_COMMUNITY)
Admission: RE | Admit: 2013-03-11 | Discharge: 2013-03-11 | Disposition: A | Discharge status: Home / Self Care | Payer: Medicare PPO | Source: Ambulatory Visit | Attending: Oncology | Admitting: Oncology

## 2013-03-11 DIAGNOSIS — Z85820 Personal history of malignant melanoma of skin: Secondary | ICD-10-CM | POA: Insufficient documentation

## 2013-03-11 DIAGNOSIS — C4371 Malignant melanoma of right lower limb, including hip: Secondary | ICD-10-CM

## 2013-03-11 DIAGNOSIS — Z87891 Personal history of nicotine dependence: Secondary | ICD-10-CM | POA: Insufficient documentation

## 2013-03-11 DIAGNOSIS — C4359 Malignant melanoma of other part of trunk: Secondary | ICD-10-CM

## 2013-03-11 DIAGNOSIS — I10 Essential (primary) hypertension: Secondary | ICD-10-CM | POA: Insufficient documentation

## 2013-03-11 NOTE — Progress Notes (Signed)
Quick Note:  Please notify patient and call/fax these results to patient's doctors. ______ 

## 2013-03-19 ENCOUNTER — Telehealth: Payer: Self-pay | Admitting: Medical Oncology

## 2013-03-19 NOTE — Telephone Encounter (Signed)
I called pt with his chest x-ray results from 03/11/13

## 2013-03-21 NOTE — Telephone Encounter (Signed)
Called pt with results of chest x-ray

## 2013-06-05 ENCOUNTER — Other Ambulatory Visit: Payer: Self-pay

## 2013-07-04 ENCOUNTER — Other Ambulatory Visit: Payer: Self-pay | Admitting: Family Medicine

## 2013-09-05 ENCOUNTER — Other Ambulatory Visit: Payer: Self-pay

## 2013-09-20 ENCOUNTER — Ambulatory Visit (INDEPENDENT_AMBULATORY_CARE_PROVIDER_SITE_OTHER): Payer: Medicare PPO | Admitting: Family Medicine

## 2013-09-20 ENCOUNTER — Encounter: Payer: Self-pay | Admitting: Family Medicine

## 2013-09-20 VITALS — BP 140/80 | HR 60 | Temp 97.7°F | Ht 67.5 in | Wt 183.0 lb

## 2013-09-20 DIAGNOSIS — Z125 Encounter for screening for malignant neoplasm of prostate: Secondary | ICD-10-CM

## 2013-09-20 DIAGNOSIS — I10 Essential (primary) hypertension: Secondary | ICD-10-CM

## 2013-09-20 DIAGNOSIS — Z0000 Encounter for general adult medical examination without abnormal findings: Secondary | ICD-10-CM

## 2013-09-20 LAB — BASIC METABOLIC PANEL
CO2: 31 mEq/L (ref 19–32)
Calcium: 9.8 mg/dL (ref 8.4–10.5)
Chloride: 101 mEq/L (ref 96–112)
Glucose, Bld: 101 mg/dL — ABNORMAL HIGH (ref 70–99)
Potassium: 4.2 mEq/L (ref 3.5–5.1)
Sodium: 137 mEq/L (ref 135–145)

## 2013-09-20 LAB — CBC WITH DIFFERENTIAL/PLATELET
Basophils Absolute: 0 10*3/uL (ref 0.0–0.1)
Basophils Relative: 0.7 % (ref 0.0–3.0)
HCT: 44.4 % (ref 39.0–52.0)
Hemoglobin: 14.9 g/dL (ref 13.0–17.0)
Lymphs Abs: 1.4 10*3/uL (ref 0.7–4.0)
MCV: 93.6 fl (ref 78.0–100.0)
Monocytes Absolute: 0.4 10*3/uL (ref 0.1–1.0)
Monocytes Relative: 9.1 % (ref 3.0–12.0)
Neutro Abs: 2.7 10*3/uL (ref 1.4–7.7)
Platelets: 184 10*3/uL (ref 150.0–400.0)
RDW: 13.4 % (ref 11.5–14.6)

## 2013-09-20 LAB — POCT URINALYSIS DIPSTICK
Ketones, UA: NEGATIVE
Protein, UA: NEGATIVE
Spec Grav, UA: 1.015
Urobilinogen, UA: 0.2
pH, UA: 8.5

## 2013-09-20 LAB — HEPATIC FUNCTION PANEL
ALT: 18 U/L (ref 0–53)
AST: 21 U/L (ref 0–37)
Albumin: 4.2 g/dL (ref 3.5–5.2)
Alkaline Phosphatase: 67 U/L (ref 39–117)
Bilirubin, Direct: 0.1 mg/dL (ref 0.0–0.3)
Total Bilirubin: 1.2 mg/dL (ref 0.3–1.2)
Total Protein: 6.7 g/dL (ref 6.0–8.3)

## 2013-09-20 LAB — LIPID PANEL
HDL: 56.3 mg/dL (ref >39.00)
VLDL: 12.6 mg/dL (ref 0.0–40.0)

## 2013-09-20 MED ORDER — FLUTICASONE-SALMETEROL 250-50 MCG/DOSE IN AEPB: 1.0000 | INHALATION_SPRAY | Freq: Two times a day (BID) | RESPIRATORY_TRACT | Status: DC

## 2013-09-20 MED ORDER — TELMISARTAN-HCTZ 80-25 MG PO TABS: ORAL_TABLET | ORAL | Status: DC

## 2013-09-20 MED ORDER — ALBUTEROL SULFATE HFA 108 (90 BASE) MCG/ACT IN AERS: 2.0000 | INHALATION_SPRAY | RESPIRATORY_TRACT | Status: DC | PRN

## 2013-09-20 NOTE — Progress Notes (Signed)
Pre visit review using our clinic review tool, if applicable. No additional management support is needed unless otherwise documented below in the visit note. 

## 2013-09-20 NOTE — Progress Notes (Signed)
  Subjective:    Patient ID: Shannon Jacquet., male    DOB: 11-23-1945, 67 y.o.   MRN: [id number]  HPI 67 yr old male for a cpx. He feels well. He still rides his bicycle several days a week.    Review of Systems  Constitutional: Negative.   HENT: Negative.   Eyes: Negative.   Respiratory: Negative.   Cardiovascular: Negative.   Gastrointestinal: Negative.   Genitourinary: Negative.   Musculoskeletal: Negative.   Skin: Negative.   Neurological: Negative.   Psychiatric/Behavioral: Negative.        Objective:   Physical Exam  Constitutional: He is oriented to person, place, and time. He appears well-developed and well-nourished. No distress.  HENT:  Head: Normocephalic and atraumatic.  Right Ear: External ear normal.  Left Ear: External ear normal.  Nose: Nose normal.  Mouth/Throat: Oropharynx is clear and moist. No oropharyngeal exudate.  Eyes: Conjunctivae and EOM are normal. Pupils are equal, round, and reactive to light. Right eye exhibits no discharge. Left eye exhibits no discharge. No scleral icterus.  Neck: Neck supple. No JVD present. No tracheal deviation present. No thyromegaly present.  Cardiovascular: Normal rate, regular rhythm, normal heart sounds and intact distal pulses.  Exam reveals no gallop and no friction rub.   No murmur heard. EKG normal   Pulmonary/Chest: Effort normal and breath sounds normal. No respiratory distress. He has no wheezes. He has no rales. He exhibits no tenderness.  Abdominal: Soft. Bowel sounds are normal. He exhibits no distension and no mass. There is no tenderness. There is no rebound and no guarding.  Genitourinary: Rectum normal, prostate normal and penis normal. Guaiac negative stool. No penile tenderness.  Musculoskeletal: Normal range of motion. He exhibits no edema and no tenderness.  Lymphadenopathy:    He has no cervical adenopathy.  Neurological: He is alert and oriented to person, place, and time. He has normal  reflexes. No cranial nerve deficit. He exhibits normal muscle tone. Coordination normal.  Skin: Skin is warm and dry. No rash noted. He is not diaphoretic. No erythema. No pallor.  Psychiatric: He has a normal mood and affect. His behavior is normal. Judgment and thought content normal.          Assessment & Plan:  Well exam. Get fasting labs

## 2013-09-25 NOTE — Telephone Encounter (Signed)
Tell him I agree he is due for a pneumonia shot, so he can schedule for a Prevnar. His PSA only went up 0.7 of a point so nothing to worry about. We will recheck this at his cpx in one year

## 2013-10-02 ENCOUNTER — Ambulatory Visit: Payer: Medicare PPO | Admitting: Family Medicine

## 2013-10-04 ENCOUNTER — Ambulatory Visit (INDEPENDENT_AMBULATORY_CARE_PROVIDER_SITE_OTHER): Payer: Medicare PPO | Admitting: Family Medicine

## 2013-10-04 ENCOUNTER — Telehealth: Payer: Self-pay | Admitting: Family Medicine

## 2013-10-04 DIAGNOSIS — Z23 Encounter for immunization: Secondary | ICD-10-CM

## 2013-10-04 DIAGNOSIS — Z2911 Encounter for prophylactic immunotherapy for respiratory syncytial virus (RSV): Secondary | ICD-10-CM

## 2013-10-04 NOTE — Telephone Encounter (Signed)
I recommend he get a Prevnar booster

## 2013-10-04 NOTE — Telephone Encounter (Signed)
Pt wants to know if he needs to get a booster pneumonia or prevnar injection?

## 2013-10-07 NOTE — Telephone Encounter (Signed)
I left message with below information. 

## 2013-11-01 ENCOUNTER — Ambulatory Visit (INDEPENDENT_AMBULATORY_CARE_PROVIDER_SITE_OTHER): Payer: Medicare PPO | Admitting: Family Medicine

## 2013-11-01 DIAGNOSIS — Z23 Encounter for immunization: Secondary | ICD-10-CM

## 2014-08-05 ENCOUNTER — Encounter: Payer: Self-pay | Admitting: Family Medicine

## 2014-09-07 ENCOUNTER — Other Ambulatory Visit: Payer: Self-pay | Admitting: Family Medicine

## 2014-09-15 ENCOUNTER — Ambulatory Visit (INDEPENDENT_AMBULATORY_CARE_PROVIDER_SITE_OTHER): Payer: Medicare PPO | Admitting: Family Medicine

## 2014-09-15 ENCOUNTER — Encounter: Payer: Self-pay | Admitting: Family Medicine

## 2014-09-15 VITALS — BP 128/74 | HR 59 | Temp 98.9°F | Ht 67.5 in | Wt 184.0 lb

## 2014-09-15 DIAGNOSIS — Z0000 Encounter for general adult medical examination without abnormal findings: Secondary | ICD-10-CM

## 2014-09-15 DIAGNOSIS — I10 Essential (primary) hypertension: Secondary | ICD-10-CM

## 2014-09-15 LAB — CBC WITH DIFFERENTIAL/PLATELET
Basophils Absolute: 0 10*3/uL (ref 0.0–0.1)
Basophils Relative: 0.5 % (ref 0.0–3.0)
Eosinophils Absolute: 0.1 10*3/uL (ref 0.0–0.7)
Eosinophils Relative: 1.3 % (ref 0.0–5.0)
HEMATOCRIT: 45.5 % (ref 39.0–52.0)
Hemoglobin: 15 g/dL (ref 13.0–17.0)
LYMPHS ABS: 1.5 10*3/uL (ref 0.7–4.0)
Lymphocytes Relative: 21.1 % (ref 12.0–46.0)
MCHC: 33 g/dL (ref 30.0–36.0)
MCV: 95.1 fl (ref 78.0–100.0)
MONOS PCT: 8.4 % (ref 3.0–12.0)
Monocytes Absolute: 0.6 10*3/uL (ref 0.1–1.0)
Neutro Abs: 4.9 10*3/uL (ref 1.4–7.7)
Neutrophils Relative %: 68.7 % (ref 43.0–77.0)
Platelets: 203 10*3/uL (ref 150.0–400.0)
RBC: 4.78 Mil/uL (ref 4.22–5.81)
RDW: 13.5 % (ref 11.5–15.5)
WBC: 7.2 10*3/uL (ref 4.0–10.5)

## 2014-09-15 LAB — POCT URINALYSIS DIPSTICK
BILIRUBIN UA: NEGATIVE
Blood, UA: NEGATIVE
Glucose, UA: NEGATIVE
LEUKOCYTES UA: NEGATIVE
Nitrite, UA: NEGATIVE
Protein, UA: NEGATIVE
Spec Grav, UA: 1.015
Urobilinogen, UA: 0.2
pH, UA: 7

## 2014-09-15 MED ORDER — TELMISARTAN-HCTZ 80-25 MG PO TABS: ORAL_TABLET | ORAL | Status: DC

## 2014-09-15 NOTE — Progress Notes (Signed)
   Subjective:    Patient ID: Shannon Horton., male    DOB: [birth year], 68 y.o.   MRN: [id number]  HPI 68 yr old male for a cpx.    Review of Systems  Constitutional: Negative.   HENT: Negative.   Eyes: Negative.   Respiratory: Negative.   Cardiovascular: Negative.   Gastrointestinal: Negative.   Genitourinary: Negative.   Musculoskeletal: Negative.   Skin: Negative.   Neurological: Negative.   Psychiatric/Behavioral: Negative.        Objective:   Physical Exam  Constitutional: He is oriented to person, place, and time. He appears well-developed and well-nourished. No distress.  HENT:  Head: Normocephalic and atraumatic.  Right Ear: External ear normal.  Left Ear: External ear normal.  Nose: Nose normal.  Mouth/Throat: Oropharynx is clear and moist. No oropharyngeal exudate.  Eyes: Conjunctivae and EOM are normal. Pupils are equal, round, and reactive to light. Right eye exhibits no discharge. Left eye exhibits no discharge. No scleral icterus.  Neck: Neck supple. No JVD present. No tracheal deviation present. No thyromegaly present.  Cardiovascular: Normal rate, regular rhythm, normal heart sounds and intact distal pulses.  Exam reveals no gallop and no friction rub.   No murmur heard. EKG shows sinus bradycardia   Pulmonary/Chest: Effort normal and breath sounds normal. No respiratory distress. He has no wheezes. He has no rales. He exhibits no tenderness.  Abdominal: Soft. Bowel sounds are normal. He exhibits no distension and no mass. There is no tenderness. There is no rebound and no guarding.  Genitourinary: Rectum normal, prostate normal and penis normal. Guaiac negative stool. No penile tenderness.  Musculoskeletal: Normal range of motion. He exhibits no edema or tenderness.  Lymphadenopathy:    He has no cervical adenopathy.  Neurological: He is alert and oriented to person, place, and time. He has normal reflexes. No cranial nerve deficit. He exhibits normal  muscle tone. Coordination normal.  Skin: Skin is warm and dry. No rash noted. He is not diaphoretic. No erythema. No pallor.  Psychiatric: He has a normal mood and affect. His behavior is normal. Judgment and thought content normal.          Assessment & Plan:  Well exam. Get fasting labs.

## 2014-09-16 ENCOUNTER — Other Ambulatory Visit: Payer: Self-pay | Admitting: Family Medicine

## 2014-09-16 LAB — LIPID PANEL
CHOL/HDL RATIO: 3
Cholesterol: 225 mg/dL — ABNORMAL HIGH (ref 0–200)
HDL: 71.1 mg/dL (ref >39.00)
LDL CALC: 143 mg/dL — AB (ref 0–99)
NonHDL: 153.9
Triglycerides: 57 mg/dL (ref 0.0–149.0)
VLDL: 11.4 mg/dL (ref 0.0–40.0)

## 2014-09-16 LAB — HEPATIC FUNCTION PANEL
ALT: 19 U/L (ref 0–53)
AST: 22 U/L (ref 0–37)
Albumin: 4.6 g/dL (ref 3.5–5.2)
Alkaline Phosphatase: 66 U/L (ref 39–117)
BILIRUBIN TOTAL: 0.9 mg/dL (ref 0.2–1.2)
Bilirubin, Direct: 0.2 mg/dL (ref 0.0–0.3)
Total Protein: 7.1 g/dL (ref 6.0–8.3)

## 2014-09-16 LAB — BASIC METABOLIC PANEL
BUN: 14 mg/dL (ref 6–23)
CO2: 25 mEq/L (ref 19–32)
CREATININE: 0.7 mg/dL (ref 0.4–1.5)
Calcium: 10 mg/dL (ref 8.4–10.5)
Chloride: 103 mEq/L (ref 96–112)
GFR: 111.66 mL/min (ref >60.00)
Glucose, Bld: 96 mg/dL (ref 70–99)
POTASSIUM: 4.5 meq/L (ref 3.5–5.1)
Sodium: 141 mEq/L (ref 135–145)

## 2014-09-16 LAB — PSA: PSA: 0.81 ng/mL (ref 0.10–4.00)

## 2014-09-16 LAB — TSH: TSH: 2.38 u[IU]/mL (ref 0.35–4.50)

## 2014-11-04 ENCOUNTER — Encounter: Payer: Self-pay | Admitting: Family Medicine

## 2014-11-06 ENCOUNTER — Ambulatory Visit (INDEPENDENT_AMBULATORY_CARE_PROVIDER_SITE_OTHER): Payer: Medicare PPO | Admitting: Family Medicine

## 2014-11-06 ENCOUNTER — Encounter: Payer: Self-pay | Admitting: Family Medicine

## 2014-11-06 VITALS — BP 120/80 | HR 95 | Temp 97.0°F | Ht 67.5 in | Wt 186.6 lb

## 2014-11-06 DIAGNOSIS — H60392 Other infective otitis externa, left ear: Secondary | ICD-10-CM

## 2014-11-06 MED ORDER — OFLOXACIN 0.3 % OT SOLN: 5.0000 [drp] | Freq: Every day | OTIC | Status: DC

## 2014-11-06 NOTE — Progress Notes (Signed)
Pre visit review using our clinic review tool, if applicable. No additional management support is needed unless otherwise documented below in the visit note. 

## 2014-11-06 NOTE — Progress Notes (Signed)
HPI:  Acute visit for:  R Ear Fluid: -after shower yesterday felt like water in this ear -stuck qtip in ear thinks injured ear as hurt with qtip and a little "tiny" bit of blood on qutip -denies: hearing loss, tinnitus, pain, drainage out of ear,  ROS: See pertinent positives and negatives per HPI.  Past Medical History  Diagnosis Date  . Allergic rhinitis     gets shots per Dr. Velora Heckler  . Asthma   . Hypertension   . Malignant melanoma     right lower leg, diagnosed on1/21/11    Past Surgical History  Procedure Laterality Date  . Colonoscopy  2006     per Dr.Kaplan, clear, repeat in 10 yrs   . Esophagogastroduodenoscopy      WITH ESOPHAGEAL DILATION 2006 PER dR. kAPLAN  . Removal  of melanoma  12/08/09    from right calf; per Dr. Monica Becton  . Removal of melanoma from left abdomen  01/21/10     per Dr. Meredith Leeds  . Cataract extraction      right ey, Dr. Satira Sark   . Squamous cell carcinoma excision  2013    per Dr. Terrence Dupont, from top of scalp     Family History  Problem Relation Age of Onset  . Hypertension Other     History   Social History  . Marital Status: Married    Spouse Name: N/A    Number of Children: N/A  . Years of Education: N/A   Social History Main Topics  . Smoking status: Never Smoker   . Smokeless tobacco: Never Used  . Alcohol Use: 4.2 oz/week    7 Not specified per week     Comment: 2 beers each day  . Drug Use: No  . Sexual Activity: None   Other Topics Concern  . None   Social History Narrative    Current outpatient prescriptions: albuterol (PROVENTIL HFA;VENTOLIN HFA) 108 (90 BASE) MCG/ACT inhaler, Inhale 2 puffs into the lungs every 4 (four) hours as needed for wheezing or shortness of breath., Disp: 3 Inhaler, Rfl: 3;  beclomethasone (QVAR) 80 MCG/ACT inhaler, Inhale 1 puff into the lungs 2 (two) times daily., Disp: , Rfl: ;  Multiple Vitamin (MULTIVITAMIN) tablet, Take 1 tablet by mouth daily. One A Day, Disp: , Rfl:   telmisartan-hydrochlorothiazide (MICARDIS HCT) 80-25 MG per tablet, TAKE 1 TABLET BY MOUTH DAILY., Disp: 90 tablet, Rfl: 3;  UNABLE TO FIND, Allergy shots at [facility] every other week, Disp: , Rfl: ;  ofloxacin (FLOXIN) 0.3 % otic solution, Place 5 drops into the left ear daily., Disp: 5 mL, Rfl: 0  EXAM:  Filed Vitals:   11/06/14 1601  BP: 120/80  Pulse: 95  Temp: 97 F (36.1 C)    Body mass index is 28.78 kg/(m^2).  GENERAL: vitals reviewed and listed above, alert, oriented, appears well hydrated and in no acute distress  HEENT: atraumatic, conjunttiva clear, no obvious abnormalities on inspection of external nose and ears, tiny bit of dried blood in R ear canal and small area of erythema ? Mild swelling ear canal.  NECK: no obvious masses on inspection  MS: moves all extremities without noticeable abnormality  PSYCH: pleasant and cooperative, no obvious depression or anxiety  ASSESSMENT AND PLAN:  Discussed the following assessment and plan:  Otitis, externa, infective, left - Plan: ofloxacin (FLOXIN) 0.3 % otic solution  -query mild otitis externa 2ndary to trauma from qtip -advised to avoid qutips in ear -  Patient advised to return or notify a doctor immediately if symptoms worsen or persist or new concerns arise.  There are no Patient Instructions on file for this visit.   Colin Benton R.

## 2014-11-07 ENCOUNTER — Telehealth: Payer: Self-pay | Admitting: Family Medicine

## 2014-11-07 MED ORDER — CIPROFLOXACIN-DEXAMETHASONE 0.3-0.1 % OT SUSP: 4.0000 [drp] | Freq: Two times a day (BID) | OTIC | Status: DC

## 2014-11-07 NOTE — Telephone Encounter (Signed)
Different rx sent

## 2014-11-07 NOTE — Telephone Encounter (Signed)
Patient states [facility] told him that they no longer make ofloxacin (FLOXIN) 0.3 % otic solution sent in by Dr. Maudie Mercury.  Patient would another RX sent to [id number] - [place], [place] - [address]

## 2014-12-17 ENCOUNTER — Telehealth: Payer: Self-pay | Admitting: Family Medicine

## 2014-12-17 NOTE — Telephone Encounter (Signed)
Pt came to say that my chart tells him he need to have a pneumonia vac booster. Do he need to have this done.Marland Kitchen

## 2014-12-19 NOTE — Telephone Encounter (Signed)
Per Dr. Sarajane Jews pt does not need any type of booster. Pt has had both pneumonia vaccines.

## 2014-12-19 NOTE — Telephone Encounter (Signed)
I spoke with pt  

## 2015-03-23 ENCOUNTER — Other Ambulatory Visit: Payer: Self-pay | Admitting: Family Medicine

## 2015-04-27 ENCOUNTER — Other Ambulatory Visit: Payer: Self-pay

## 2015-05-10 ENCOUNTER — Encounter: Payer: Self-pay | Admitting: Family Medicine

## 2015-06-09 DIAGNOSIS — J301 Allergic rhinitis due to pollen: Secondary | ICD-10-CM | POA: Diagnosis not present

## 2015-06-09 DIAGNOSIS — J3089 Other allergic rhinitis: Secondary | ICD-10-CM | POA: Diagnosis not present

## 2015-06-09 DIAGNOSIS — H43811 Vitreous degeneration, right eye: Secondary | ICD-10-CM | POA: Diagnosis not present

## 2015-06-09 DIAGNOSIS — J3081 Allergic rhinitis due to animal (cat) (dog) hair and dander: Secondary | ICD-10-CM | POA: Diagnosis not present

## 2015-06-09 DIAGNOSIS — H40013 Open angle with borderline findings, low risk, bilateral: Secondary | ICD-10-CM | POA: Diagnosis not present

## 2015-06-17 DIAGNOSIS — J301 Allergic rhinitis due to pollen: Secondary | ICD-10-CM | POA: Diagnosis not present

## 2015-06-17 DIAGNOSIS — J3089 Other allergic rhinitis: Secondary | ICD-10-CM | POA: Diagnosis not present

## 2015-06-17 DIAGNOSIS — J3081 Allergic rhinitis due to animal (cat) (dog) hair and dander: Secondary | ICD-10-CM | POA: Diagnosis not present

## 2015-06-23 DIAGNOSIS — J301 Allergic rhinitis due to pollen: Secondary | ICD-10-CM | POA: Diagnosis not present

## 2015-06-23 DIAGNOSIS — J3089 Other allergic rhinitis: Secondary | ICD-10-CM | POA: Diagnosis not present

## 2015-06-23 DIAGNOSIS — J3081 Allergic rhinitis due to animal (cat) (dog) hair and dander: Secondary | ICD-10-CM | POA: Diagnosis not present

## 2015-06-26 DIAGNOSIS — J301 Allergic rhinitis due to pollen: Secondary | ICD-10-CM | POA: Diagnosis not present

## 2015-06-26 DIAGNOSIS — J3089 Other allergic rhinitis: Secondary | ICD-10-CM | POA: Diagnosis not present

## 2015-06-26 DIAGNOSIS — J3081 Allergic rhinitis due to animal (cat) (dog) hair and dander: Secondary | ICD-10-CM | POA: Diagnosis not present

## 2015-06-29 DIAGNOSIS — J3081 Allergic rhinitis due to animal (cat) (dog) hair and dander: Secondary | ICD-10-CM | POA: Diagnosis not present

## 2015-06-29 DIAGNOSIS — J301 Allergic rhinitis due to pollen: Secondary | ICD-10-CM | POA: Diagnosis not present

## 2015-06-29 DIAGNOSIS — J3089 Other allergic rhinitis: Secondary | ICD-10-CM | POA: Diagnosis not present

## 2015-06-30 ENCOUNTER — Encounter: Payer: Self-pay | Admitting: Family Medicine

## 2015-07-01 DIAGNOSIS — Z85828 Personal history of other malignant neoplasm of skin: Secondary | ICD-10-CM | POA: Diagnosis not present

## 2015-07-01 DIAGNOSIS — D1800 Hemangioma unspecified site: Secondary | ICD-10-CM | POA: Diagnosis not present

## 2015-07-01 DIAGNOSIS — L814 Other melanin hyperpigmentation: Secondary | ICD-10-CM | POA: Diagnosis not present

## 2015-07-01 DIAGNOSIS — L821 Other seborrheic keratosis: Secondary | ICD-10-CM | POA: Diagnosis not present

## 2015-07-01 DIAGNOSIS — Z86018 Personal history of other benign neoplasm: Secondary | ICD-10-CM | POA: Diagnosis not present

## 2015-07-01 DIAGNOSIS — L570 Actinic keratosis: Secondary | ICD-10-CM | POA: Diagnosis not present

## 2015-07-01 DIAGNOSIS — Z85820 Personal history of malignant melanoma of skin: Secondary | ICD-10-CM | POA: Diagnosis not present

## 2015-07-02 NOTE — Telephone Encounter (Signed)
Tell him to go ahead and call to set up the colonoscopy

## 2015-07-07 ENCOUNTER — Encounter: Payer: Self-pay | Admitting: Family Medicine

## 2015-07-10 DIAGNOSIS — J3089 Other allergic rhinitis: Secondary | ICD-10-CM | POA: Diagnosis not present

## 2015-07-10 DIAGNOSIS — J4530 Mild persistent asthma, uncomplicated: Secondary | ICD-10-CM | POA: Diagnosis not present

## 2015-07-10 DIAGNOSIS — J301 Allergic rhinitis due to pollen: Secondary | ICD-10-CM | POA: Diagnosis not present

## 2015-07-10 DIAGNOSIS — J3081 Allergic rhinitis due to animal (cat) (dog) hair and dander: Secondary | ICD-10-CM | POA: Diagnosis not present

## 2015-07-13 DIAGNOSIS — J301 Allergic rhinitis due to pollen: Secondary | ICD-10-CM | POA: Diagnosis not present

## 2015-07-13 DIAGNOSIS — J3081 Allergic rhinitis due to animal (cat) (dog) hair and dander: Secondary | ICD-10-CM | POA: Diagnosis not present

## 2015-07-13 DIAGNOSIS — J3089 Other allergic rhinitis: Secondary | ICD-10-CM | POA: Diagnosis not present

## 2015-08-04 DIAGNOSIS — J301 Allergic rhinitis due to pollen: Secondary | ICD-10-CM | POA: Diagnosis not present

## 2015-08-04 DIAGNOSIS — J3081 Allergic rhinitis due to animal (cat) (dog) hair and dander: Secondary | ICD-10-CM | POA: Diagnosis not present

## 2015-08-04 DIAGNOSIS — J3089 Other allergic rhinitis: Secondary | ICD-10-CM | POA: Diagnosis not present

## 2015-08-20 DIAGNOSIS — J301 Allergic rhinitis due to pollen: Secondary | ICD-10-CM | POA: Diagnosis not present

## 2015-08-20 DIAGNOSIS — J3089 Other allergic rhinitis: Secondary | ICD-10-CM | POA: Diagnosis not present

## 2015-08-20 DIAGNOSIS — J3081 Allergic rhinitis due to animal (cat) (dog) hair and dander: Secondary | ICD-10-CM | POA: Diagnosis not present

## 2015-09-03 ENCOUNTER — Other Ambulatory Visit: Payer: Self-pay | Admitting: Family Medicine

## 2015-09-09 ENCOUNTER — Other Ambulatory Visit (INDEPENDENT_AMBULATORY_CARE_PROVIDER_SITE_OTHER): Payer: Medicare PPO

## 2015-09-09 DIAGNOSIS — Z0000 Encounter for general adult medical examination without abnormal findings: Secondary | ICD-10-CM

## 2015-09-09 DIAGNOSIS — I10 Essential (primary) hypertension: Secondary | ICD-10-CM

## 2015-09-09 DIAGNOSIS — Z125 Encounter for screening for malignant neoplasm of prostate: Secondary | ICD-10-CM | POA: Diagnosis not present

## 2015-09-09 LAB — LIPID PANEL
Cholesterol: 217 mg/dL — ABNORMAL HIGH (ref 0–200)
HDL: 59.9 mg/dL (ref >39.00)
LDL Cholesterol: 143 mg/dL — ABNORMAL HIGH (ref 0–99)
NONHDL: 157.12
Total CHOL/HDL Ratio: 4
Triglycerides: 72 mg/dL (ref 0.0–149.0)
VLDL: 14.4 mg/dL (ref 0.0–40.0)

## 2015-09-09 LAB — CBC WITH DIFFERENTIAL/PLATELET
BASOS PCT: 0.8 % (ref 0.0–3.0)
Basophils Absolute: 0.1 10*3/uL (ref 0.0–0.1)
EOS ABS: 0.1 10*3/uL (ref 0.0–0.7)
Eosinophils Relative: 2.2 % (ref 0.0–5.0)
HCT: 47.2 % (ref 39.0–52.0)
Hemoglobin: 15.8 g/dL (ref 13.0–17.0)
LYMPHS PCT: 24.6 % (ref 12.0–46.0)
Lymphs Abs: 1.6 10*3/uL (ref 0.7–4.0)
MCHC: 33.4 g/dL (ref 30.0–36.0)
MCV: 95.6 fl (ref 78.0–100.0)
Monocytes Absolute: 0.6 10*3/uL (ref 0.1–1.0)
Monocytes Relative: 9 % (ref 3.0–12.0)
NEUTROS ABS: 4.2 10*3/uL (ref 1.4–7.7)
Neutrophils Relative %: 63.4 % (ref 43.0–77.0)
PLATELETS: 203 10*3/uL (ref 150.0–400.0)
RBC: 4.94 Mil/uL (ref 4.22–5.81)
RDW: 13.4 % (ref 11.5–15.5)
WBC: 6.7 10*3/uL (ref 4.0–10.5)

## 2015-09-09 LAB — BASIC METABOLIC PANEL
BUN: 11 mg/dL (ref 6–23)
CHLORIDE: 99 meq/L (ref 96–112)
CO2: 29 meq/L (ref 19–32)
Calcium: 10.1 mg/dL (ref 8.4–10.5)
Creatinine, Ser: 0.7 mg/dL (ref 0.40–1.50)
GFR: 118.72 mL/min (ref >60.00)
Glucose, Bld: 104 mg/dL — ABNORMAL HIGH (ref 70–99)
Potassium: 4.3 mEq/L (ref 3.5–5.1)
Sodium: 141 mEq/L (ref 135–145)

## 2015-09-09 LAB — HEPATIC FUNCTION PANEL
ALBUMIN: 4.6 g/dL (ref 3.5–5.2)
ALT: 15 U/L (ref 0–53)
AST: 18 U/L (ref 0–37)
Alkaline Phosphatase: 69 U/L (ref 39–117)
BILIRUBIN DIRECT: 0.2 mg/dL (ref 0.0–0.3)
TOTAL PROTEIN: 6.9 g/dL (ref 6.0–8.3)
Total Bilirubin: 0.9 mg/dL (ref 0.2–1.2)

## 2015-09-09 LAB — POCT URINALYSIS DIPSTICK
Bilirubin, UA: NEGATIVE
Glucose, UA: NEGATIVE
LEUKOCYTES UA: NEGATIVE
Nitrite, UA: NEGATIVE
PH UA: 5.5
PROTEIN UA: NEGATIVE
Urobilinogen, UA: 0.2

## 2015-09-09 LAB — TSH: TSH: 4.03 u[IU]/mL (ref 0.35–4.50)

## 2015-09-09 LAB — PSA: PSA: 0.79 ng/mL (ref 0.10–4.00)

## 2015-09-14 DIAGNOSIS — J3089 Other allergic rhinitis: Secondary | ICD-10-CM | POA: Diagnosis not present

## 2015-09-14 DIAGNOSIS — H401112 Primary open-angle glaucoma, right eye, moderate stage: Secondary | ICD-10-CM | POA: Diagnosis not present

## 2015-09-14 DIAGNOSIS — J3081 Allergic rhinitis due to animal (cat) (dog) hair and dander: Secondary | ICD-10-CM | POA: Diagnosis not present

## 2015-09-14 DIAGNOSIS — J301 Allergic rhinitis due to pollen: Secondary | ICD-10-CM | POA: Diagnosis not present

## 2015-09-14 DIAGNOSIS — H401122 Primary open-angle glaucoma, left eye, moderate stage: Secondary | ICD-10-CM | POA: Diagnosis not present

## 2015-09-16 ENCOUNTER — Ambulatory Visit (INDEPENDENT_AMBULATORY_CARE_PROVIDER_SITE_OTHER): Payer: Medicare PPO | Admitting: Family Medicine

## 2015-09-16 ENCOUNTER — Encounter: Payer: Self-pay | Admitting: Family Medicine

## 2015-09-16 VITALS — BP 148/79 | HR 58 | Temp 98.1°F | Ht 67.5 in | Wt 178.0 lb

## 2015-09-16 DIAGNOSIS — Z0000 Encounter for general adult medical examination without abnormal findings: Secondary | ICD-10-CM | POA: Diagnosis not present

## 2015-09-16 DIAGNOSIS — H409 Unspecified glaucoma: Secondary | ICD-10-CM | POA: Diagnosis not present

## 2015-09-16 DIAGNOSIS — I10 Essential (primary) hypertension: Secondary | ICD-10-CM

## 2015-09-16 NOTE — Progress Notes (Signed)
Pre visit review using our clinic review tool, if applicable. No additional management support is needed unless otherwise documented below in the visit note. 

## 2015-09-16 NOTE — Progress Notes (Signed)
   Subjective:    Patient ID: Shannon Horton., male    DOB: [birth year], 69 y.o.   MRN: [id number]  HPI 69 yr old male for a cpx. He feels well physically and he remains active. He rides his bicycle and he uses a rowing machine. He was recently diagnosed with glaucoma and was started on eye drops (but he cannot remember the name of them). He does mention that he struggles with some anxiety at times, and he has a tendency to worry about things. He asks if we could recommend someone he could talk to.   Review of Systems  Constitutional: Negative.   HENT: Negative.   Eyes: Negative.   Respiratory: Negative.   Cardiovascular: Negative.   Gastrointestinal: Negative.   Genitourinary: Negative.   Musculoskeletal: Negative.   Skin: Negative.   Neurological: Negative.   Psychiatric/Behavioral: Negative.        Objective:   Physical Exam  Constitutional: He is oriented to person, place, and time. He appears well-developed and well-nourished. No distress.  HENT:  Head: Normocephalic and atraumatic.  Right Ear: External ear normal.  Left Ear: External ear normal.  Nose: Nose normal.  Mouth/Throat: Oropharynx is clear and moist. No oropharyngeal exudate.  Eyes: Conjunctivae and EOM are normal. Pupils are equal, round, and reactive to light. Right eye exhibits no discharge. Left eye exhibits no discharge. No scleral icterus.  Neck: Neck supple. No JVD present. No tracheal deviation present. No thyromegaly present.  Cardiovascular: Normal rate, regular rhythm, normal heart sounds and intact distal pulses.  Exam reveals no gallop and no friction rub.   No murmur heard. Pulmonary/Chest: Effort normal and breath sounds normal. No respiratory distress. He has no wheezes. He has no rales. He exhibits no tenderness.  Abdominal: Soft. Bowel sounds are normal. He exhibits no distension and no mass. There is no tenderness. There is no rebound and no guarding.  Genitourinary: Rectum normal, prostate  normal and penis normal. Guaiac negative stool. No penile tenderness.  Musculoskeletal: Normal range of motion. He exhibits no edema or tenderness.  Lymphadenopathy:    He has no cervical adenopathy.  Neurological: He is alert and oriented to person, place, and time. He has normal reflexes. No cranial nerve deficit. He exhibits normal muscle tone. Coordination normal.  Skin: Skin is warm and dry. No rash noted. He is not diaphoretic. No erythema. No pallor.  Psychiatric: He has a normal mood and affect. His behavior is normal. Judgment and thought content normal.          Assessment & Plan:  Well exam. We discussed diet and exercise. I gave him information about the [facility] and encouraged him to make an appt.

## 2015-09-17 ENCOUNTER — Telehealth: Payer: Self-pay | Admitting: Family Medicine

## 2015-09-17 ENCOUNTER — Encounter: Payer: Self-pay | Admitting: Family Medicine

## 2015-09-17 NOTE — Telephone Encounter (Signed)
Updated medication list for eye drops, per pt request.

## 2015-09-29 DIAGNOSIS — J3089 Other allergic rhinitis: Secondary | ICD-10-CM | POA: Diagnosis not present

## 2015-09-29 DIAGNOSIS — J3081 Allergic rhinitis due to animal (cat) (dog) hair and dander: Secondary | ICD-10-CM | POA: Diagnosis not present

## 2015-09-29 DIAGNOSIS — J301 Allergic rhinitis due to pollen: Secondary | ICD-10-CM | POA: Diagnosis not present

## 2015-10-12 DIAGNOSIS — H401122 Primary open-angle glaucoma, left eye, moderate stage: Secondary | ICD-10-CM | POA: Diagnosis not present

## 2015-10-13 DIAGNOSIS — J301 Allergic rhinitis due to pollen: Secondary | ICD-10-CM | POA: Diagnosis not present

## 2015-10-13 DIAGNOSIS — J3089 Other allergic rhinitis: Secondary | ICD-10-CM | POA: Diagnosis not present

## 2015-10-13 DIAGNOSIS — J3081 Allergic rhinitis due to animal (cat) (dog) hair and dander: Secondary | ICD-10-CM | POA: Diagnosis not present

## 2015-10-28 ENCOUNTER — Ambulatory Visit (INDEPENDENT_AMBULATORY_CARE_PROVIDER_SITE_OTHER): Payer: Medicare PPO | Admitting: Family Medicine

## 2015-10-28 ENCOUNTER — Encounter: Payer: Self-pay | Admitting: Family Medicine

## 2015-10-28 VITALS — BP 156/91 | HR 67 | Temp 98.5°F | Ht 67.5 in | Wt 178.0 lb

## 2015-10-28 DIAGNOSIS — M10072 Idiopathic gout, left ankle and foot: Secondary | ICD-10-CM

## 2015-10-28 DIAGNOSIS — M109 Gout, unspecified: Secondary | ICD-10-CM

## 2015-10-28 DIAGNOSIS — J209 Acute bronchitis, unspecified: Secondary | ICD-10-CM

## 2015-10-28 MED ORDER — METHYLPREDNISOLONE 4 MG PO TBPK: ORAL_TABLET | ORAL | Status: DC

## 2015-10-28 MED ORDER — AZITHROMYCIN 250 MG PO TABS: ORAL_TABLET | ORAL | Status: DC

## 2015-10-28 NOTE — Progress Notes (Signed)
Pre visit review using our clinic review tool, if applicable. No additional management support is needed unless otherwise documented below in the visit note. 

## 2015-10-28 NOTE — Progress Notes (Signed)
   Subjective:    Patient ID: Shannon Horton., male    DOB: April 30, 1946, 69 y.o.   MRN: [id number]  HPI Here for 2 things. First he has had chest congestion and coughing up yellow sputum for the past 4 weeks. No fever. Using Mucinex. Also for the past 3 days he has had a painful toe. No recent trauma but he did run around a lot over the weekend playing Frisbee golf.    Review of Systems  Constitutional: Negative.   HENT: Positive for congestion and postnasal drip. Negative for sinus pressure and sore throat.   Eyes: Negative.   Respiratory: Positive for cough.   Musculoskeletal: Positive for joint swelling and arthralgias.       Objective:   Physical Exam  Constitutional: He appears well-developed and well-nourished.  HENT:  Right Ear: External ear normal.  Left Ear: External ear normal.  Nose: Nose normal.  Mouth/Throat: Oropharynx is clear and moist.  Eyes: Conjunctivae are normal.  Neck: No thyromegaly present.  Pulmonary/Chest: Effort normal. No respiratory distress. He has no wheezes. He has no rales.  Scattered rhonchi   Musculoskeletal:  The left 2nd toe is red and warm and swollen and tender over the DIP joint   Lymphadenopathy:    He has no cervical adenopathy.          Assessment & Plan:  He has bronchitis, so treat with a Zpack. He has probable gout in the toe, treat with a Medrol dose pack.

## 2015-10-30 DIAGNOSIS — J301 Allergic rhinitis due to pollen: Secondary | ICD-10-CM | POA: Diagnosis not present

## 2015-10-30 DIAGNOSIS — J3089 Other allergic rhinitis: Secondary | ICD-10-CM | POA: Diagnosis not present

## 2015-10-30 DIAGNOSIS — J3081 Allergic rhinitis due to animal (cat) (dog) hair and dander: Secondary | ICD-10-CM | POA: Diagnosis not present

## 2015-11-03 ENCOUNTER — Encounter: Payer: Self-pay | Admitting: Family Medicine

## 2015-11-03 DIAGNOSIS — Z1211 Encounter for screening for malignant neoplasm of colon: Secondary | ICD-10-CM

## 2015-11-05 ENCOUNTER — Encounter: Payer: Self-pay | Admitting: Gastroenterology

## 2015-12-15 HISTORY — PX: COLONOSCOPY: SHX174

## 2015-12-21 ENCOUNTER — Ambulatory Visit (AMBULATORY_SURGERY_CENTER): Payer: Self-pay | Admitting: *Deleted

## 2015-12-21 VITALS — Ht 67.5 in | Wt 178.0 lb

## 2015-12-21 DIAGNOSIS — Z1211 Encounter for screening for malignant neoplasm of colon: Secondary | ICD-10-CM

## 2015-12-21 NOTE — Progress Notes (Signed)
Patient is allergic to eggs. Pt unsure if he is allergic to Soy. He does not get flu vaccine. Pt denies any O2 use at home, and denies any problems with anesthesia or sedation.

## 2015-12-31 ENCOUNTER — Other Ambulatory Visit: Payer: Self-pay

## 2015-12-31 ENCOUNTER — Encounter: Payer: Self-pay | Admitting: Gastroenterology

## 2016-01-04 ENCOUNTER — Ambulatory Visit (AMBULATORY_SURGERY_CENTER): Payer: Medicare Other | Admitting: Gastroenterology

## 2016-01-04 ENCOUNTER — Encounter: Payer: Self-pay | Admitting: Gastroenterology

## 2016-01-04 VITALS — BP 118/70 | HR 48 | Temp 98.0°F | Resp 12 | Ht 67.5 in | Wt 178.0 lb

## 2016-01-04 DIAGNOSIS — Z1211 Encounter for screening for malignant neoplasm of colon: Secondary | ICD-10-CM

## 2016-01-04 MED ORDER — SODIUM CHLORIDE 0.9 % IV SOLN: 500.0000 mL | INTRAVENOUS | Status: DC

## 2016-01-04 NOTE — Patient Instructions (Signed)
YOU HAD AN ENDOSCOPIC PROCEDURE TODAY AT [facility]:   Refer to the procedure report that was given to you for any specific questions about what was found during the examination.  If the procedure report does not answer your questions, please call your gastroenterologist to clarify.  If you requested that your care partner not be given the details of your procedure findings, then the procedure report has been included in a sealed envelope for you to review at your convenience later.  YOU SHOULD EXPECT: Some feelings of bloating in the abdomen. Passage of more gas than usual.  Walking can help get rid of the air that was put into your GI tract during the procedure and reduce the bloating. If you had a lower endoscopy (such as a colonoscopy or flexible sigmoidoscopy) you may notice spotting of blood in your stool or on the toilet paper. If you underwent a bowel prep for your procedure, you may not have a normal bowel movement for a few days.  Please Note:  You might notice some irritation and congestion in your nose or some drainage.  This is from the oxygen used during your procedure.  There is no need for concern and it should clear up in a day or so.  SYMPTOMS TO REPORT IMMEDIATELY:   Following lower endoscopy (colonoscopy or flexible sigmoidoscopy):  Excessive amounts of blood in the stool  Significant tenderness or worsening of abdominal pains  Swelling of the abdomen that is new, acute  Fever of 100F or higher   For urgent or emergent issues, a gastroenterologist can be reached at any hour by calling [phone].   DIET: Your first meal following the procedure should be a small meal and then it is ok to progress to your normal diet. Heavy or fried foods are harder to digest and may make you feel nauseous or bloated.  Likewise, meals heavy in dairy and vegetables can increase bloating.  Drink plenty of fluids but you should avoid alcoholic beverages for 24  hours.  ACTIVITY:  You should plan to take it easy for the rest of today and you should NOT DRIVE or use heavy machinery until tomorrow (because of the sedation medicines used during the test).    FOLLOW UP: Our staff will call the number listed on your records the next business day following your procedure to check on you and address any questions or concerns that you may have regarding the information given to you following your procedure. If we do not reach you, we will leave a message.  However, if you are feeling well and you are not experiencing any problems, there is no need to return our call.  We will assume that you have returned to your regular daily activities without incident.  If any biopsies were taken you will be contacted by phone or by letter within the next 1-3 weeks.  Please call us at [phone] if you have not heard about the biopsies in 3 weeks.    SIGNATURES/CONFIDENTIALITY: You and/or your care partner have signed paperwork which will be entered into your electronic medical record.  These signatures attest to the fact that that the information above on your After Visit Summary has been reviewed and is understood.  Full responsibility of the confidentiality of this discharge information lies with you and/or your care-partner.  Diverticulosis-handout given  Recall screening colonoscopy no necessary due to age but if you need [place] please call.

## 2016-01-04 NOTE — Progress Notes (Signed)
To recovery, report to Mirts, RN, VSS. 

## 2016-01-04 NOTE — Op Note (Signed)
[place]  [address]. [place], [id number]   COLONOSCOPY PROCEDURE REPORT  PATIENT: Shannon Horton, Shannon Horton  MR#: [id number] BIRTHDATE: 08-02-1946 , 70  yrs. old GENDER: male ENDOSCOPIST: Wilfrid Lund, MD REFERRED [id number] Fry, MD PROCEDURE DATE:  01/04/2016 PROCEDURE:   Colonoscopy, screening First Screening Colonoscopy - Avg.  risk and is 50 yrs.  old or older - No.  Prior Negative Screening - Now for repeat screening. 10 or more years since last screening  History of Adenoma - Now for follow-up colonoscopy & has been > or = to 3 yrs.  N/A  Polyps removed today? No Recommend repeat exam, <10 yrs? No ASA CLASS:   Class II INDICATIONS:Screening for colonic neoplasia and Colorectal Neoplasm Risk Assessment for this procedure is average risk. MEDICATIONS: Monitored anesthesia care and Propofol 200 mg IV  DESCRIPTION OF PROCEDURE:   After the risks benefits and alternatives of the procedure were thoroughly explained, informed consent was obtained.  The digital rectal exam revealed no abnormalities of the rectum.   The LB [id number]  endoscope was introduced through the anus and advanced to the cecum, which was identified by both the appendix and ileocecal valve. No adverse events experienced.   The quality of the prep was excellent. (Suprep was used)  The instrument was then slowly withdrawn as the colon was fully examined. Estimated blood loss is zero unless otherwise noted in this procedure report.      COLON FINDINGS: There was moderate diverticulosis noted in the left colon.  Retroflexed views revealed no abnormalities. The time to cecum = 5.6 Withdrawal time = 7.0   The scope was withdrawn and the procedure completed. COMPLICATIONS: There were no immediate complications.  ENDOSCOPIC IMPRESSION: There was moderate diverticulosis noted in the left colon  RECOMMENDATIONS: Recall for colonoscopy is not necessary at your age.  These exams typically  stop at about age 64.  eSigned:  Wilfrid Lund, MD 01/04/2016 10:13 AM   cc:

## 2016-01-05 ENCOUNTER — Telehealth: Payer: Self-pay | Admitting: *Deleted

## 2016-01-05 NOTE — Telephone Encounter (Signed)
  Follow up Call-  Call back number 01/04/2016  Post procedure Call Back phone  # [phone]  Permission to leave phone message Yes     Patient questions:  Do you have a fever, pain , or abdominal swelling? No. Pain Score  0 *  Have you tolerated food without any problems? Yes.    Have you been able to return to your normal activities? Yes.    Do you have any questions about your discharge instructions: Diet   No. Medications  No. Follow up visit  No.  Do you have questions or concerns about your Care? No.  Actions: * If pain score is 4 or above: No action needed, pain <4.

## 2016-06-13 ENCOUNTER — Telehealth: Payer: Self-pay | Admitting: Family Medicine

## 2016-06-13 NOTE — Telephone Encounter (Signed)
Pt would like to add hep c blood work to [employer]. Can I sch?

## 2016-06-16 ENCOUNTER — Other Ambulatory Visit: Payer: Self-pay | Admitting: Family Medicine

## 2016-06-16 DIAGNOSIS — Z209 Contact with and (suspected) exposure to unspecified communicable disease: Secondary | ICD-10-CM

## 2016-06-16 NOTE — Telephone Encounter (Signed)
Test has been added. Pt wife is aware

## 2016-06-16 NOTE — Telephone Encounter (Signed)
I put in a future lab order for Hep C.

## 2016-06-16 NOTE — Telephone Encounter (Signed)
Yes please add a Hep C test

## 2016-08-17 ENCOUNTER — Encounter: Payer: Self-pay | Admitting: Family Medicine

## 2016-08-23 ENCOUNTER — Other Ambulatory Visit: Payer: Self-pay | Admitting: Family Medicine

## 2016-09-12 ENCOUNTER — Other Ambulatory Visit (INDEPENDENT_AMBULATORY_CARE_PROVIDER_SITE_OTHER): Payer: Medicare Other

## 2016-09-12 DIAGNOSIS — Z209 Contact with and (suspected) exposure to unspecified communicable disease: Secondary | ICD-10-CM

## 2016-09-12 DIAGNOSIS — Z0000 Encounter for general adult medical examination without abnormal findings: Secondary | ICD-10-CM

## 2016-09-12 LAB — LIPID PANEL
Cholesterol: 181 mg/dL (ref 0–200)
HDL: 43.9 mg/dL (ref >39.00)
LDL Cholesterol: 122 mg/dL — ABNORMAL HIGH (ref 0–99)
NONHDL: 137.54
Total CHOL/HDL Ratio: 4
Triglycerides: 77 mg/dL (ref 0.0–149.0)
VLDL: 15.4 mg/dL (ref 0.0–40.0)

## 2016-09-12 LAB — CBC WITH DIFFERENTIAL/PLATELET
BASOS ABS: 0 10*3/uL (ref 0.0–0.1)
Basophils Relative: 0.7 % (ref 0.0–3.0)
EOS PCT: 2.4 % (ref 0.0–5.0)
Eosinophils Absolute: 0.2 10*3/uL (ref 0.0–0.7)
HEMATOCRIT: 43.1 % (ref 39.0–52.0)
Hemoglobin: 14.4 g/dL (ref 13.0–17.0)
LYMPHS PCT: 27.9 % (ref 12.0–46.0)
Lymphs Abs: 1.8 10*3/uL (ref 0.7–4.0)
MCHC: 33.5 g/dL (ref 30.0–36.0)
MCV: 93.1 fl (ref 78.0–100.0)
MONOS PCT: 10.3 % (ref 3.0–12.0)
Monocytes Absolute: 0.7 10*3/uL (ref 0.1–1.0)
NEUTROS ABS: 3.9 10*3/uL (ref 1.4–7.7)
Neutrophils Relative %: 58.7 % (ref 43.0–77.0)
PLATELETS: 294 10*3/uL (ref 150.0–400.0)
RBC: 4.63 Mil/uL (ref 4.22–5.81)
RDW: 12.8 % (ref 11.5–15.5)
WBC: 6.6 10*3/uL (ref 4.0–10.5)

## 2016-09-12 LAB — BASIC METABOLIC PANEL
BUN: 9 mg/dL (ref 6–23)
CALCIUM: 9.6 mg/dL (ref 8.4–10.5)
CHLORIDE: 100 meq/L (ref 96–112)
CO2: 31 meq/L (ref 19–32)
Creatinine, Ser: 0.78 mg/dL (ref 0.40–1.50)
GFR: 104.47 mL/min (ref >60.00)
Glucose, Bld: 94 mg/dL (ref 70–99)
POTASSIUM: 4.5 meq/L (ref 3.5–5.1)
SODIUM: 140 meq/L (ref 135–145)

## 2016-09-12 LAB — POC URINALSYSI DIPSTICK (AUTOMATED)
Bilirubin, UA: NEGATIVE
Glucose, UA: NEGATIVE
Ketones, UA: NEGATIVE
Leukocytes, UA: NEGATIVE
Nitrite, UA: NEGATIVE
PH UA: 6.5
PROTEIN UA: NEGATIVE
RBC UA: NEGATIVE
SPEC GRAV UA: 1.01
UROBILINOGEN UA: 0.2

## 2016-09-12 LAB — HEPATIC FUNCTION PANEL
ALBUMIN: 4.1 g/dL (ref 3.5–5.2)
ALK PHOS: 82 U/L (ref 39–117)
ALT: 13 U/L (ref 0–53)
AST: 18 U/L (ref 0–37)
Bilirubin, Direct: 0.2 mg/dL (ref 0.0–0.3)
TOTAL PROTEIN: 6.4 g/dL (ref 6.0–8.3)
Total Bilirubin: 0.8 mg/dL (ref 0.2–1.2)

## 2016-09-12 LAB — PSA: PSA: 1.43 ng/mL (ref 0.10–4.00)

## 2016-09-12 LAB — TSH: TSH: 2.85 u[IU]/mL (ref 0.35–4.50)

## 2016-09-13 LAB — HEPATITIS C ANTIBODY: HCV Ab: NEGATIVE

## 2016-09-19 ENCOUNTER — Ambulatory Visit (INDEPENDENT_AMBULATORY_CARE_PROVIDER_SITE_OTHER): Payer: Medicare Other | Admitting: Family Medicine

## 2016-09-19 ENCOUNTER — Encounter: Payer: Self-pay | Admitting: Family Medicine

## 2016-09-19 VITALS — BP 139/79 | HR 75 | Temp 98.4°F | Ht 67.5 in | Wt 173.0 lb

## 2016-09-19 DIAGNOSIS — Z0000 Encounter for general adult medical examination without abnormal findings: Secondary | ICD-10-CM

## 2016-09-19 MED ORDER — ALBUTEROL SULFATE HFA 108 (90 BASE) MCG/ACT IN AERS: INHALATION_SPRAY | RESPIRATORY_TRACT | 5 refills | Status: DC

## 2016-09-19 NOTE — Progress Notes (Signed)
   Subjective:    Patient ID: Shannon Horton., male    DOB: 1946-04-07, 70 y.o.   MRN: [id number]  HPI 70 yr old male for a well exam. He feels fine although he still has a slight dry cough from a recent bronchitis. He saw Urgent Care about 2 weeks ago and was given a Zpack. hes till rdies his bike almost every day.    Review of Systems  Constitutional: Negative.   HENT: Negative.   Eyes: Negative.   Respiratory: Negative.   Cardiovascular: Negative.   Gastrointestinal: Negative.   Genitourinary: Negative.   Musculoskeletal: Negative.   Skin: Negative.   Neurological: Negative.   Psychiatric/Behavioral: Negative.        Objective:   Physical Exam  Constitutional: He is oriented to person, place, and time. He appears well-developed and well-nourished. No distress.  HENT:  Head: Normocephalic and atraumatic.  Right Ear: External ear normal.  Left Ear: External ear normal.  Nose: Nose normal.  Mouth/Throat: Oropharynx is clear and moist. No oropharyngeal exudate.  Eyes: Conjunctivae and EOM are normal. Pupils are equal, round, and reactive to light. Right eye exhibits no discharge. Left eye exhibits no discharge. No scleral icterus.  Neck: Neck supple. No JVD present. No tracheal deviation present. No thyromegaly present.  Cardiovascular: Normal rate, regular rhythm, normal heart sounds and intact distal pulses.  Exam reveals no gallop and no friction rub.   No murmur heard. EKG shows sinus with LAFB, no change from his baseline  Pulmonary/Chest: Effort normal and breath sounds normal. No respiratory distress. He has no wheezes. He has no rales. He exhibits no tenderness.  Abdominal: Soft. Bowel sounds are normal. He exhibits no distension and no mass. There is no tenderness. There is no rebound and no guarding.  Genitourinary: Rectum normal, prostate normal and penis normal. Rectal exam shows guaiac negative stool. No penile tenderness.  Musculoskeletal: Normal range of  motion. He exhibits no edema or tenderness.  Lymphadenopathy:    He has no cervical adenopathy.  Neurological: He is alert and oriented to person, place, and time. He has normal reflexes. No cranial nerve deficit. He exhibits normal muscle tone. Coordination normal.  Skin: Skin is warm and dry. No rash noted. He is not diaphoretic. No erythema. No pallor.  Psychiatric: He has a normal mood and affect. His behavior is normal. Judgment and thought content normal.          Assessment & Plan:  Well exam. We discussed diet and exercise.  Laurey Morale, MD

## 2016-09-19 NOTE — Progress Notes (Signed)
Pre visit review using our clinic review tool, if applicable. No additional management support is needed unless otherwise documented below in the visit note. 

## 2016-09-20 ENCOUNTER — Encounter: Payer: Self-pay | Admitting: Family Medicine

## 2016-09-20 ENCOUNTER — Telehealth: Payer: Self-pay

## 2016-09-20 ENCOUNTER — Telehealth: Payer: Self-pay | Admitting: Family Medicine

## 2016-09-20 NOTE — Telephone Encounter (Signed)
° ° °  Pt call to say that his insurance denied the following the med   albuterol (VENTOLIN HFA) 108 (90 Base) MCG/ACT inhaler    He is asking if something else can be called in.Marland Kitchen   Pharmacy  [facility]

## 2016-09-20 NOTE — Telephone Encounter (Signed)
Received PA request from [facility] for Ventolin. PA submitted & is pending. Key: [id number]

## 2016-09-20 NOTE — Telephone Encounter (Signed)
I spoke with pt and explained that we are waiting on a name of alternative that his insurance will cover.

## 2016-09-20 NOTE — Telephone Encounter (Signed)
Prior Authorization was submitted today, waiting on response.

## 2016-09-21 ENCOUNTER — Other Ambulatory Visit: Payer: Self-pay | Admitting: Family Medicine

## 2016-09-21 MED ORDER — ALBUTEROL SULFATE HFA 108 (90 BASE) MCG/ACT IN AERS: 2.0000 | INHALATION_SPRAY | RESPIRATORY_TRACT | 1 refills | Status: DC | PRN

## 2016-09-21 NOTE — Telephone Encounter (Signed)
PA denied, new Rx sent.

## 2017-02-27 ENCOUNTER — Ambulatory Visit (INDEPENDENT_AMBULATORY_CARE_PROVIDER_SITE_OTHER): Payer: 59 | Admitting: Psychology

## 2017-02-27 DIAGNOSIS — F411 Generalized anxiety disorder: Secondary | ICD-10-CM | POA: Diagnosis not present

## 2017-03-16 ENCOUNTER — Ambulatory Visit (INDEPENDENT_AMBULATORY_CARE_PROVIDER_SITE_OTHER): Payer: 59 | Admitting: Psychology

## 2017-03-16 DIAGNOSIS — F411 Generalized anxiety disorder: Secondary | ICD-10-CM | POA: Diagnosis not present

## 2017-04-11 ENCOUNTER — Ambulatory Visit (INDEPENDENT_AMBULATORY_CARE_PROVIDER_SITE_OTHER): Payer: 59 | Admitting: Psychology

## 2017-04-11 DIAGNOSIS — F411 Generalized anxiety disorder: Secondary | ICD-10-CM | POA: Diagnosis not present

## 2017-05-11 ENCOUNTER — Ambulatory Visit (INDEPENDENT_AMBULATORY_CARE_PROVIDER_SITE_OTHER): Payer: 59 | Admitting: Psychology

## 2017-05-11 DIAGNOSIS — F411 Generalized anxiety disorder: Secondary | ICD-10-CM | POA: Diagnosis not present

## 2017-06-09 ENCOUNTER — Ambulatory Visit (INDEPENDENT_AMBULATORY_CARE_PROVIDER_SITE_OTHER): Payer: 59 | Admitting: Psychology

## 2017-06-09 DIAGNOSIS — F411 Generalized anxiety disorder: Secondary | ICD-10-CM | POA: Diagnosis not present

## 2017-07-07 ENCOUNTER — Encounter: Payer: Self-pay | Admitting: Family Medicine

## 2017-07-11 ENCOUNTER — Ambulatory Visit (INDEPENDENT_AMBULATORY_CARE_PROVIDER_SITE_OTHER): Payer: 59 | Admitting: Psychology

## 2017-07-11 DIAGNOSIS — F411 Generalized anxiety disorder: Secondary | ICD-10-CM

## 2017-07-20 ENCOUNTER — Encounter: Payer: Self-pay | Admitting: Family Medicine

## 2017-07-27 ENCOUNTER — Encounter: Payer: Self-pay | Admitting: Family Medicine

## 2017-07-28 NOTE — Telephone Encounter (Signed)
Noted  

## 2017-08-08 ENCOUNTER — Ambulatory Visit (INDEPENDENT_AMBULATORY_CARE_PROVIDER_SITE_OTHER): Payer: 59 | Admitting: Psychology

## 2017-08-08 DIAGNOSIS — F411 Generalized anxiety disorder: Secondary | ICD-10-CM | POA: Diagnosis not present

## 2017-08-29 ENCOUNTER — Ambulatory Visit (INDEPENDENT_AMBULATORY_CARE_PROVIDER_SITE_OTHER): Payer: Medicare Other | Admitting: Family Medicine

## 2017-08-29 ENCOUNTER — Encounter: Payer: Self-pay | Admitting: Family Medicine

## 2017-08-29 VITALS — BP 160/82 | HR 60 | Temp 97.9°F | Ht 67.5 in | Wt 172.8 lb

## 2017-08-29 DIAGNOSIS — J302 Other seasonal allergic rhinitis: Secondary | ICD-10-CM | POA: Diagnosis not present

## 2017-08-29 DIAGNOSIS — N401 Enlarged prostate with lower urinary tract symptoms: Secondary | ICD-10-CM

## 2017-08-29 DIAGNOSIS — I10 Essential (primary) hypertension: Secondary | ICD-10-CM | POA: Diagnosis not present

## 2017-08-29 DIAGNOSIS — N138 Other obstructive and reflux uropathy: Secondary | ICD-10-CM

## 2017-08-29 DIAGNOSIS — E785 Hyperlipidemia, unspecified: Secondary | ICD-10-CM

## 2017-08-29 DIAGNOSIS — Z23 Encounter for immunization: Secondary | ICD-10-CM

## 2017-08-29 DIAGNOSIS — J4520 Mild intermittent asthma, uncomplicated: Secondary | ICD-10-CM

## 2017-08-29 LAB — TSH: TSH: 2.81 u[IU]/mL (ref 0.35–4.50)

## 2017-08-29 LAB — POC URINALSYSI DIPSTICK (AUTOMATED)
BILIRUBIN UA: NEGATIVE
GLUCOSE UA: NEGATIVE
Nitrite, UA: NEGATIVE
Protein, UA: NEGATIVE
RBC UA: NEGATIVE
SPEC GRAV UA: 1.015 (ref 1.010–1.025)
Urobilinogen, UA: 0.2 E.U./dL
pH, UA: 7 (ref 5.0–8.0)

## 2017-08-29 LAB — BASIC METABOLIC PANEL
BUN: 12 mg/dL (ref 6–23)
CALCIUM: 10 mg/dL (ref 8.4–10.5)
CO2: 30 mEq/L (ref 19–32)
Chloride: 99 mEq/L (ref 96–112)
Creatinine, Ser: 0.66 mg/dL (ref 0.40–1.50)
GFR: 126.34 mL/min (ref >60.00)
Glucose, Bld: 97 mg/dL (ref 70–99)
Potassium: 4 mEq/L (ref 3.5–5.1)
SODIUM: 140 meq/L (ref 135–145)

## 2017-08-29 LAB — CBC WITH DIFFERENTIAL/PLATELET
BASOS PCT: 0.5 % (ref 0.0–3.0)
Basophils Absolute: 0 10*3/uL (ref 0.0–0.1)
EOS ABS: 0.1 10*3/uL (ref 0.0–0.7)
EOS PCT: 1.8 % (ref 0.0–5.0)
HCT: 45.6 % (ref 39.0–52.0)
HEMOGLOBIN: 15.3 g/dL (ref 13.0–17.0)
Lymphocytes Relative: 14.3 % (ref 12.0–46.0)
Lymphs Abs: 1.2 10*3/uL (ref 0.7–4.0)
MCHC: 33.7 g/dL (ref 30.0–36.0)
MCV: 93.9 fl (ref 78.0–100.0)
MONO ABS: 0.7 10*3/uL (ref 0.1–1.0)
Monocytes Relative: 8.2 % (ref 3.0–12.0)
NEUTROS ABS: 6.2 10*3/uL (ref 1.4–7.7)
NEUTROS PCT: 75.2 % (ref 43.0–77.0)
PLATELETS: 195 10*3/uL (ref 150.0–400.0)
RBC: 4.85 Mil/uL (ref 4.22–5.81)
RDW: 13.5 % (ref 11.5–15.5)
WBC: 8.2 10*3/uL (ref 4.0–10.5)

## 2017-08-29 LAB — HEPATIC FUNCTION PANEL
ALBUMIN: 4.6 g/dL (ref 3.5–5.2)
ALK PHOS: 69 U/L (ref 39–117)
ALT: 12 U/L (ref 0–53)
AST: 16 U/L (ref 0–37)
Bilirubin, Direct: 0.2 mg/dL (ref 0.0–0.3)
TOTAL PROTEIN: 6.8 g/dL (ref 6.0–8.3)
Total Bilirubin: 0.8 mg/dL (ref 0.2–1.2)

## 2017-08-29 LAB — LIPID PANEL
Cholesterol: 201 mg/dL — ABNORMAL HIGH (ref 0–200)
HDL: 59.4 mg/dL (ref >39.00)
LDL CALC: 129 mg/dL — AB (ref 0–99)
NONHDL: 142
Total CHOL/HDL Ratio: 3
Triglycerides: 64 mg/dL (ref 0.0–149.0)
VLDL: 12.8 mg/dL (ref 0.0–40.0)

## 2017-08-29 LAB — PSA: PSA: 1.31 ng/mL (ref 0.10–4.00)

## 2017-08-29 NOTE — Progress Notes (Signed)
   Subjective:    Patient ID: Shannon Horton., male    DOB: 01-25-1946, 71 y.o.   MRN: [id number]  HPI Here to follow up on issues. He feels great. He still rides his bike 20-30 miles about 4 days a week. His wife is on the Weight [employer] diet and he basically eats what she does. His BP is stable at home with readings of 120s to 130s over 70s. His allergies and asthma are stable.    Review of Systems  Constitutional: Negative.   HENT: Negative.   Eyes: Negative.   Respiratory: Negative.   Cardiovascular: Negative.   Gastrointestinal: Negative.   Genitourinary: Negative.   Musculoskeletal: Negative.   Skin: Negative.   Neurological: Negative.   Psychiatric/Behavioral: Negative.        Objective:   Physical Exam  Constitutional: He is oriented to person, place, and time. He appears well-developed and well-nourished. No distress.  HENT:  Head: Normocephalic and atraumatic.  Right Ear: External ear normal.  Left Ear: External ear normal.  Nose: Nose normal.  Mouth/Throat: Oropharynx is clear and moist. No oropharyngeal exudate.  Eyes: Pupils are equal, round, and reactive to light. Conjunctivae and EOM are normal. Right eye exhibits no discharge. Left eye exhibits no discharge. No scleral icterus.  Neck: Neck supple. No JVD present. No tracheal deviation present. No thyromegaly present.  Cardiovascular: Normal rate, regular rhythm, normal heart sounds and intact distal pulses.  Exam reveals no gallop and no friction rub.   No murmur heard. Pulmonary/Chest: Effort normal and breath sounds normal. No respiratory distress. He has no wheezes. He has no rales. He exhibits no tenderness.  Abdominal: Soft. Bowel sounds are normal. He exhibits no distension and no mass. There is no tenderness. There is no rebound and no guarding.  Genitourinary: Rectum normal, prostate normal and penis normal. Rectal exam shows guaiac negative stool. No penile tenderness.  Musculoskeletal: Normal  range of motion. He exhibits no edema or tenderness.  Lymphadenopathy:    He has no cervical adenopathy.  Neurological: He is alert and oriented to person, place, and time. He has normal reflexes. No cranial nerve deficit. He exhibits normal muscle tone. Coordination normal.  Skin: Skin is warm and dry. No rash noted. He is not diaphoretic. No erythema. No pallor.  Psychiatric: He has a normal mood and affect. His behavior is normal. Judgment and thought content normal.          Assessment & Plan:  His HTN is stable. Get fasting labs today to check lipids, etc. His allergies and asthma are controlled. He saw his dermatologist last week and had 2 benign lesions removed.  Alysia Penna, MD

## 2017-09-01 ENCOUNTER — Encounter: Payer: Self-pay | Admitting: Family Medicine

## 2017-09-04 ENCOUNTER — Encounter: Payer: Self-pay | Admitting: Family Medicine

## 2017-09-05 NOTE — Telephone Encounter (Signed)
See my answer to his other note

## 2017-09-05 NOTE — Telephone Encounter (Signed)
Dyslipidemia simply means something is abnormal with his lipids. His LDL cholesterol is usually a little high. The BPH diagnosis was used to order the PSA test. Men can have BPH symptoms (slow urine stream, getting up to urinate at night, etc.) but still have the prostate show normal overall size on my exam

## 2017-09-05 NOTE — Telephone Encounter (Signed)
See my answer to the other note

## 2017-09-12 ENCOUNTER — Other Ambulatory Visit: Payer: Self-pay | Admitting: Family Medicine

## 2017-09-19 ENCOUNTER — Ambulatory Visit: Payer: 59 | Admitting: Psychology

## 2017-11-10 ENCOUNTER — Encounter: Payer: Self-pay | Admitting: Family Medicine

## 2017-11-13 ENCOUNTER — Ambulatory Visit: Payer: Medicare Other | Admitting: Psychology

## 2017-11-13 DIAGNOSIS — F411 Generalized anxiety disorder: Secondary | ICD-10-CM

## 2017-11-22 ENCOUNTER — Encounter: Payer: Self-pay | Admitting: Family Medicine

## 2017-11-22 ENCOUNTER — Telehealth: Payer: Self-pay | Admitting: Family Medicine

## 2017-11-22 NOTE — Telephone Encounter (Signed)
Pt calling to check on refill.  

## 2017-11-22 NOTE — Telephone Encounter (Signed)
Last OV 08/29/2017.   Rx was last refilled 09/26/2016 disp 18 g with 1 refill sent to PCP for approval.

## 2017-11-22 NOTE — Telephone Encounter (Signed)
Looks like it has been awhile since script was sent it.

## 2017-11-22 NOTE — Telephone Encounter (Signed)
Can we refill Proair inhaler?

## 2017-11-23 ENCOUNTER — Encounter: Payer: Self-pay | Admitting: Family Medicine

## 2017-11-23 MED ORDER — ALBUTEROL SULFATE HFA 108 (90 BASE) MCG/ACT IN AERS: 2.0000 | INHALATION_SPRAY | RESPIRATORY_TRACT | 11 refills | Status: DC | PRN

## 2017-11-23 NOTE — Telephone Encounter (Signed)
Call this in with 11 rf

## 2017-11-23 NOTE — Telephone Encounter (Signed)
Rx has been sent  

## 2017-11-23 NOTE — Telephone Encounter (Signed)
Call in #1 with 11 rf

## 2017-11-24 ENCOUNTER — Other Ambulatory Visit: Payer: Self-pay

## 2017-11-24 ENCOUNTER — Encounter: Payer: Self-pay | Admitting: Family Medicine

## 2017-11-24 MED ORDER — ALBUTEROL SULFATE HFA 108 (90 BASE) MCG/ACT IN AERS: 2.0000 | INHALATION_SPRAY | RESPIRATORY_TRACT | 11 refills | Status: DC | PRN

## 2017-11-24 NOTE — Telephone Encounter (Signed)
Actually these results look fine. Continue current medications

## 2017-12-05 ENCOUNTER — Other Ambulatory Visit: Payer: Self-pay | Admitting: Family Medicine

## 2018-02-19 ENCOUNTER — Other Ambulatory Visit: Payer: Self-pay | Admitting: Family Medicine

## 2018-04-23 ENCOUNTER — Encounter: Payer: Self-pay | Admitting: Family Medicine

## 2018-08-22 ENCOUNTER — Encounter: Payer: Self-pay | Admitting: Family Medicine

## 2018-08-24 ENCOUNTER — Other Ambulatory Visit: Payer: Self-pay | Admitting: Family Medicine

## 2018-09-04 ENCOUNTER — Encounter: Payer: Self-pay | Admitting: Family Medicine

## 2018-09-04 ENCOUNTER — Ambulatory Visit (INDEPENDENT_AMBULATORY_CARE_PROVIDER_SITE_OTHER): Payer: Medicare Other | Admitting: Family Medicine

## 2018-09-04 VITALS — BP 118/80 | HR 63 | Temp 98.0°F | Ht 68.0 in | Wt 174.4 lb

## 2018-09-04 DIAGNOSIS — N138 Other obstructive and reflux uropathy: Secondary | ICD-10-CM

## 2018-09-04 DIAGNOSIS — E785 Hyperlipidemia, unspecified: Secondary | ICD-10-CM | POA: Diagnosis not present

## 2018-09-04 DIAGNOSIS — I10 Essential (primary) hypertension: Secondary | ICD-10-CM

## 2018-09-04 DIAGNOSIS — Z23 Encounter for immunization: Secondary | ICD-10-CM

## 2018-09-04 DIAGNOSIS — J4520 Mild intermittent asthma, uncomplicated: Secondary | ICD-10-CM | POA: Diagnosis not present

## 2018-09-04 DIAGNOSIS — N401 Enlarged prostate with lower urinary tract symptoms: Secondary | ICD-10-CM | POA: Diagnosis not present

## 2018-09-04 LAB — CBC WITH DIFFERENTIAL/PLATELET
BASOS ABS: 0.1 10*3/uL (ref 0.0–0.1)
Basophils Relative: 0.8 % (ref 0.0–3.0)
Eosinophils Absolute: 0.1 10*3/uL (ref 0.0–0.7)
Eosinophils Relative: 2.1 % (ref 0.0–5.0)
HCT: 45 % (ref 39.0–52.0)
HEMOGLOBIN: 15.4 g/dL (ref 13.0–17.0)
LYMPHS ABS: 1.3 10*3/uL (ref 0.7–4.0)
Lymphocytes Relative: 19.2 % (ref 12.0–46.0)
MCHC: 34.1 g/dL (ref 30.0–36.0)
MCV: 94.1 fl (ref 78.0–100.0)
MONO ABS: 0.6 10*3/uL (ref 0.1–1.0)
Monocytes Relative: 8.9 % (ref 3.0–12.0)
NEUTROS PCT: 69 % (ref 43.0–77.0)
Neutro Abs: 4.6 10*3/uL (ref 1.4–7.7)
Platelets: 198 10*3/uL (ref 150.0–400.0)
RBC: 4.78 Mil/uL (ref 4.22–5.81)
RDW: 13.2 % (ref 11.5–15.5)
WBC: 6.6 10*3/uL (ref 4.0–10.5)

## 2018-09-04 LAB — POC URINALSYSI DIPSTICK (AUTOMATED)
Bilirubin, UA: NEGATIVE
Blood, UA: NEGATIVE
Glucose, UA: NEGATIVE
LEUKOCYTES UA: NEGATIVE
Nitrite, UA: NEGATIVE
PH UA: 7 (ref 5.0–8.0)
Protein, UA: NEGATIVE
SPEC GRAV UA: 1.015 (ref 1.010–1.025)
UROBILINOGEN UA: 0.2 U/dL

## 2018-09-04 LAB — BASIC METABOLIC PANEL
BUN: 10 mg/dL (ref 6–23)
CALCIUM: 9.9 mg/dL (ref 8.4–10.5)
CO2: 29 meq/L (ref 19–32)
CREATININE: 0.78 mg/dL (ref 0.40–1.50)
Chloride: 98 mEq/L (ref 96–112)
GFR: 103.89 mL/min (ref >60.00)
Glucose, Bld: 106 mg/dL — ABNORMAL HIGH (ref 70–99)
Potassium: 4 mEq/L (ref 3.5–5.1)
SODIUM: 138 meq/L (ref 135–145)

## 2018-09-04 LAB — LIPID PANEL
Cholesterol: 204 mg/dL — ABNORMAL HIGH (ref 0–200)
HDL: 62.2 mg/dL (ref >39.00)
LDL CALC: 129 mg/dL — AB (ref 0–99)
NONHDL: 142.19
Total CHOL/HDL Ratio: 3
Triglycerides: 65 mg/dL (ref 0.0–149.0)
VLDL: 13 mg/dL (ref 0.0–40.0)

## 2018-09-04 LAB — HEPATIC FUNCTION PANEL
ALT: 12 U/L (ref 0–53)
AST: 20 U/L (ref 0–37)
Albumin: 4.6 g/dL (ref 3.5–5.2)
Alkaline Phosphatase: 61 U/L (ref 39–117)
BILIRUBIN TOTAL: 0.9 mg/dL (ref 0.2–1.2)
Bilirubin, Direct: 0.2 mg/dL (ref 0.0–0.3)
TOTAL PROTEIN: 6.7 g/dL (ref 6.0–8.3)

## 2018-09-04 LAB — TSH: TSH: 2.4 u[IU]/mL (ref 0.35–4.50)

## 2018-09-04 LAB — PSA: PSA: 1.18 ng/mL (ref 0.10–4.00)

## 2018-09-04 MED ORDER — TELMISARTAN-HCTZ 80-25 MG PO TABS: 1.0000 | ORAL_TABLET | Freq: Every day | ORAL | 3 refills | Status: DC

## 2018-09-04 NOTE — Progress Notes (Signed)
   Subjective:    Patient ID: Shannon Jury., male    DOB: 07/30/1946, 72 y.o.   MRN: [id number]  HPI Here to follow up on issues. He feels well in general. His BP is stable. He still exercises daily, including riding his bike. 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 Danielle Dess. He sees Dr. Renda Rolls regularly for skin checks. His asthma is doing well.    Review of Systems  Constitutional: Negative.   HENT: Negative.   Eyes: Negative.   Respiratory: Negative.   Cardiovascular: Negative.   Gastrointestinal: Negative.   Genitourinary: Negative.   Musculoskeletal: Negative.   Skin: Negative.   Neurological: Negative.   Psychiatric/Behavioral: Negative.        Objective:   Physical Exam  Constitutional: He is oriented to person, place, and time. He appears well-developed and well-nourished. No distress.  HENT:  Head: Normocephalic and atraumatic.  Right Ear: External ear normal.  Left Ear: External ear normal.  Nose: Nose normal.  Mouth/Throat: Oropharynx is clear and moist. No oropharyngeal exudate.  Eyes: Pupils are equal, round, and reactive to light. Conjunctivae and EOM are normal. Right eye exhibits no discharge. Left eye exhibits no discharge. No scleral icterus.  Neck: Neck supple. No JVD present. No tracheal deviation present. No thyromegaly present.  Cardiovascular: Normal rate, regular rhythm, normal heart sounds and intact distal pulses. Exam reveals no gallop and no friction rub.  No murmur heard. Pulmonary/Chest: Effort normal and breath sounds normal. No respiratory distress. He has no wheezes. He has no rales. He exhibits no tenderness.  Abdominal: Soft. Bowel sounds are normal. He exhibits no distension and no mass. There is no tenderness. There is no rebound and no guarding.  Genitourinary: Rectum normal, prostate normal and penis normal. Rectal exam shows guaiac negative stool. No penile tenderness.  Musculoskeletal: Normal range of  motion. He exhibits no edema or tenderness.  Lymphadenopathy:    He has no cervical adenopathy.  Neurological: He is alert and oriented to person, place, and time. He has normal reflexes. He displays normal reflexes. No cranial nerve deficit. He exhibits normal muscle tone. Coordination normal.  Skin: Skin is warm and dry. No rash noted. He is not diaphoretic. No erythema. No pallor.  Psychiatric: He has a normal mood and affect. His behavior is normal. Judgment and thought content normal.          Assessment & Plan:  His asthma is stable. His HTN is stable. He will follow up with his Ophthalmologist for the glaucoma. Get fasting labs today.  Alysia Penna, MD

## 2018-09-10 ENCOUNTER — Encounter: Payer: Self-pay | Admitting: *Deleted

## 2018-11-09 ENCOUNTER — Encounter: Payer: Self-pay | Admitting: Family Medicine

## 2018-11-09 NOTE — Telephone Encounter (Signed)
Dr. Fry please advise. Thanks  

## 2018-11-09 NOTE — Telephone Encounter (Signed)
Yes he can try that

## 2019-02-21 ENCOUNTER — Ambulatory Visit (INDEPENDENT_AMBULATORY_CARE_PROVIDER_SITE_OTHER): Payer: Medicare Other | Admitting: Psychology

## 2019-02-21 DIAGNOSIS — F411 Generalized anxiety disorder: Secondary | ICD-10-CM | POA: Diagnosis not present

## 2019-03-21 ENCOUNTER — Encounter: Payer: Self-pay | Admitting: Family Medicine

## 2019-04-02 ENCOUNTER — Ambulatory Visit (INDEPENDENT_AMBULATORY_CARE_PROVIDER_SITE_OTHER): Payer: Medicare Other | Admitting: Psychology

## 2019-04-02 DIAGNOSIS — F411 Generalized anxiety disorder: Secondary | ICD-10-CM | POA: Diagnosis not present

## 2019-05-06 ENCOUNTER — Ambulatory Visit (INDEPENDENT_AMBULATORY_CARE_PROVIDER_SITE_OTHER): Payer: Medicare Other | Admitting: Psychology

## 2019-05-06 DIAGNOSIS — F411 Generalized anxiety disorder: Secondary | ICD-10-CM | POA: Diagnosis not present

## 2019-05-27 ENCOUNTER — Ambulatory Visit (INDEPENDENT_AMBULATORY_CARE_PROVIDER_SITE_OTHER): Payer: Medicare Other | Admitting: Psychology

## 2019-05-27 DIAGNOSIS — F411 Generalized anxiety disorder: Secondary | ICD-10-CM

## 2019-06-20 ENCOUNTER — Ambulatory Visit (INDEPENDENT_AMBULATORY_CARE_PROVIDER_SITE_OTHER): Payer: Medicare Other | Admitting: Psychology

## 2019-06-20 DIAGNOSIS — F411 Generalized anxiety disorder: Secondary | ICD-10-CM

## 2019-07-10 ENCOUNTER — Encounter: Payer: Self-pay | Admitting: Family Medicine

## 2019-07-16 ENCOUNTER — Ambulatory Visit (INDEPENDENT_AMBULATORY_CARE_PROVIDER_SITE_OTHER): Payer: Medicare Other | Admitting: Psychology

## 2019-07-16 DIAGNOSIS — F411 Generalized anxiety disorder: Secondary | ICD-10-CM

## 2019-07-27 ENCOUNTER — Other Ambulatory Visit: Payer: Self-pay | Admitting: Family Medicine

## 2019-07-29 NOTE — Telephone Encounter (Signed)
Patient need to schedule an ov for more refills. Lm for pt to schedule an appt.

## 2019-08-01 ENCOUNTER — Ambulatory Visit (INDEPENDENT_AMBULATORY_CARE_PROVIDER_SITE_OTHER): Payer: Medicare Other

## 2019-08-01 ENCOUNTER — Other Ambulatory Visit: Payer: Self-pay

## 2019-08-01 DIAGNOSIS — Z23 Encounter for immunization: Secondary | ICD-10-CM

## 2019-08-09 ENCOUNTER — Ambulatory Visit (INDEPENDENT_AMBULATORY_CARE_PROVIDER_SITE_OTHER): Payer: Medicare Other | Admitting: Psychology

## 2019-08-09 DIAGNOSIS — F411 Generalized anxiety disorder: Secondary | ICD-10-CM | POA: Diagnosis not present

## 2019-08-22 ENCOUNTER — Encounter: Payer: Self-pay | Admitting: Family Medicine

## 2019-09-06 ENCOUNTER — Ambulatory Visit (INDEPENDENT_AMBULATORY_CARE_PROVIDER_SITE_OTHER): Payer: Medicare Other | Admitting: Psychology

## 2019-09-06 DIAGNOSIS — F411 Generalized anxiety disorder: Secondary | ICD-10-CM | POA: Diagnosis not present

## 2019-09-09 ENCOUNTER — Encounter: Payer: Self-pay | Admitting: Family Medicine

## 2019-09-09 ENCOUNTER — Other Ambulatory Visit: Payer: Self-pay

## 2019-09-09 ENCOUNTER — Ambulatory Visit (INDEPENDENT_AMBULATORY_CARE_PROVIDER_SITE_OTHER): Payer: Medicare Other | Admitting: Family Medicine

## 2019-09-09 VITALS — BP 150/90 | HR 58 | Temp 99.0°F | Wt 170.0 lb

## 2019-09-09 DIAGNOSIS — E785 Hyperlipidemia, unspecified: Secondary | ICD-10-CM | POA: Diagnosis not present

## 2019-09-09 DIAGNOSIS — I10 Essential (primary) hypertension: Secondary | ICD-10-CM | POA: Diagnosis not present

## 2019-09-09 DIAGNOSIS — J4520 Mild intermittent asthma, uncomplicated: Secondary | ICD-10-CM

## 2019-09-09 DIAGNOSIS — L989 Disorder of the skin and subcutaneous tissue, unspecified: Secondary | ICD-10-CM | POA: Diagnosis not present

## 2019-09-09 DIAGNOSIS — Z0000 Encounter for general adult medical examination without abnormal findings: Secondary | ICD-10-CM | POA: Diagnosis not present

## 2019-09-09 LAB — BASIC METABOLIC PANEL
BUN: 13 mg/dL (ref 6–23)
CO2: 31 mEq/L (ref 19–32)
Calcium: 9.7 mg/dL (ref 8.4–10.5)
Chloride: 99 mEq/L (ref 96–112)
Creatinine, Ser: 0.76 mg/dL (ref 0.40–1.50)
GFR: 100.43 mL/min (ref >60.00)
Glucose, Bld: 111 mg/dL — ABNORMAL HIGH (ref 70–99)
Potassium: 4.1 mEq/L (ref 3.5–5.1)
Sodium: 138 mEq/L (ref 135–145)

## 2019-09-09 LAB — LIPID PANEL
Cholesterol: 190 mg/dL (ref 0–200)
HDL: 58.9 mg/dL (ref >39.00)
LDL Cholesterol: 115 mg/dL — ABNORMAL HIGH (ref 0–99)
NonHDL: 130.8
Total CHOL/HDL Ratio: 3
Triglycerides: 79 mg/dL (ref 0.0–149.0)
VLDL: 15.8 mg/dL (ref 0.0–40.0)

## 2019-09-09 LAB — URINALYSIS
Bilirubin Urine: NEGATIVE
Hgb urine dipstick: NEGATIVE
Ketones, ur: NEGATIVE
Leukocytes,Ua: NEGATIVE
Nitrite: NEGATIVE
Specific Gravity, Urine: 1.01 (ref 1.000–1.030)
Total Protein, Urine: NEGATIVE
Urine Glucose: NEGATIVE
Urobilinogen, UA: 0.2 (ref 0.0–1.0)
pH: 7 (ref 5.0–8.0)

## 2019-09-09 LAB — CBC WITH DIFFERENTIAL/PLATELET
Basophils Absolute: 0 10*3/uL (ref 0.0–0.1)
Basophils Relative: 0.7 % (ref 0.0–3.0)
Eosinophils Absolute: 0.1 10*3/uL (ref 0.0–0.7)
Eosinophils Relative: 1.6 % (ref 0.0–5.0)
HCT: 43.6 % (ref 39.0–52.0)
Hemoglobin: 15.1 g/dL (ref 13.0–17.0)
Lymphocytes Relative: 24 % (ref 12.0–46.0)
Lymphs Abs: 1.4 10*3/uL (ref 0.7–4.0)
MCHC: 34.6 g/dL (ref 30.0–36.0)
MCV: 93.6 fl (ref 78.0–100.0)
Monocytes Absolute: 0.5 10*3/uL (ref 0.1–1.0)
Monocytes Relative: 8.7 % (ref 3.0–12.0)
Neutro Abs: 3.8 10*3/uL (ref 1.4–7.7)
Neutrophils Relative %: 65 % (ref 43.0–77.0)
Platelets: 199 10*3/uL (ref 150.0–400.0)
RBC: 4.66 Mil/uL (ref 4.22–5.81)
RDW: 13.1 % (ref 11.5–15.5)
WBC: 5.9 10*3/uL (ref 4.0–10.5)

## 2019-09-09 LAB — PSA: PSA: 0.72 ng/mL (ref 0.10–4.00)

## 2019-09-09 LAB — HEPATIC FUNCTION PANEL
ALT: 16 U/L (ref 0–53)
AST: 21 U/L (ref 0–37)
Albumin: 4.5 g/dL (ref 3.5–5.2)
Alkaline Phosphatase: 73 U/L (ref 39–117)
Bilirubin, Direct: 0.2 mg/dL (ref 0.0–0.3)
Total Bilirubin: 0.9 mg/dL (ref 0.2–1.2)
Total Protein: 6.6 g/dL (ref 6.0–8.3)

## 2019-09-09 LAB — TSH: TSH: 3.19 u[IU]/mL (ref 0.35–4.50)

## 2019-09-09 NOTE — Progress Notes (Signed)
Subjective:    Patient ID: Shannon Horton., male    DOB: 05-10-1946, 74 y.o.   MRN: [id number]  HPI Here to follow up on issues. He feels great. He still rides his bike and rows regularlyl for exercise. His BP at home has been stable. He sees Dermatology regularly and has had 2 small squamous cell cancers removed this past year.    Review of Systems  Constitutional: Negative.   HENT: Negative.   Eyes: Negative.   Respiratory: Negative.   Cardiovascular: Negative.   Gastrointestinal: Negative.   Genitourinary: Negative.   Musculoskeletal: Negative.   Skin: Negative.   Neurological: Negative.   Psychiatric/Behavioral: Negative.        Objective:   Physical Exam Constitutional:      General: He is not in acute distress.    Appearance: He is well-developed. He is not diaphoretic.  HENT:     Head: Normocephalic and atraumatic.     Right Ear: External ear normal.     Left Ear: External ear normal.     Nose: Nose normal.     Mouth/Throat:     Pharynx: No oropharyngeal exudate.  Eyes:     General: No scleral icterus.       Right eye: No discharge.        Left eye: No discharge.     Conjunctiva/sclera: Conjunctivae normal.     Pupils: Pupils are equal, round, and reactive to light.  Neck:     Musculoskeletal: Neck supple.     Thyroid: No thyromegaly.     Vascular: No JVD.     Trachea: No tracheal deviation.  Cardiovascular:     Rate and Rhythm: Normal rate and regular rhythm.     Heart sounds: Normal heart sounds. No murmur. No friction rub. No gallop.   Pulmonary:     Effort: Pulmonary effort is normal. No respiratory distress.     Breath sounds: Normal breath sounds. No wheezing or rales.  Chest:     Chest wall: No tenderness.  Abdominal:     General: Bowel sounds are normal. There is no distension.     Palpations: Abdomen is soft. There is no mass.     Tenderness: There is no abdominal tenderness. There is no guarding or rebound.  Genitourinary:    Penis:  Normal. No tenderness.      Prostate: Normal.     Rectum: Normal. Guaiac result negative.  Musculoskeletal: Normal range of motion.        General: No tenderness.  Lymphadenopathy:     Cervical: No cervical adenopathy.  Skin:    General: Skin is warm and dry.     Coloration: Skin is not pale.     Findings: No erythema or rash.  Neurological:     Mental Status: He is alert and oriented to person, place, and time.     Cranial Nerves: No cranial nerve deficit.     Motor: No abnormal muscle tone.     Coordination: Coordination normal.     Deep Tendon Reflexes: Reflexes are normal and symmetric. Reflexes normal.  Psychiatric:        Behavior: Behavior normal.        Thought Content: Thought content normal.        Judgment: Judgment normal.           Assessment & Plan:  His HTN is stable. He will follow up with dermatology as scheduled. Get fasting labs today to check lipids, etc.  Alysia Penna, MD

## 2019-09-09 NOTE — Patient Instructions (Signed)
There are no preventive care reminders to display for this patient.  Depression screen [facility] 2/9 09/04/2018 09/19/2016 09/16/2015  Decreased Interest 0 0 0  Down, Depressed, Hopeless 0 0 0  PHQ - 2 Score 0 0 0

## 2019-10-02 ENCOUNTER — Encounter: Payer: Self-pay | Admitting: Family Medicine

## 2019-10-03 NOTE — Telephone Encounter (Signed)
Yes it is safe to go to the dentist now (they use more protection than we do)

## 2019-10-04 ENCOUNTER — Ambulatory Visit: Payer: Medicare Other | Admitting: Psychology

## 2019-11-15 DIAGNOSIS — J301 Allergic rhinitis due to pollen: Secondary | ICD-10-CM | POA: Diagnosis not present

## 2019-11-15 DIAGNOSIS — J3089 Other allergic rhinitis: Secondary | ICD-10-CM | POA: Diagnosis not present

## 2019-11-15 DIAGNOSIS — J3081 Allergic rhinitis due to animal (cat) (dog) hair and dander: Secondary | ICD-10-CM | POA: Diagnosis not present

## 2019-11-26 ENCOUNTER — Ambulatory Visit: Payer: Medicare Other

## 2019-12-05 ENCOUNTER — Ambulatory Visit: Payer: Medicare PPO | Attending: Internal Medicine

## 2019-12-05 DIAGNOSIS — Z23 Encounter for immunization: Secondary | ICD-10-CM

## 2019-12-05 NOTE — Progress Notes (Signed)
   [id number]  Name:  Shannon Horton.    MRN: [id number] DOB: 1946/07/21  12/05/2019  Shannon Horton was observed post Covid-19 immunization for 15 minutes without incidence. He was provided with Vaccine Information Sheet and instruction to access the V-Safe system.   Shannon Horton was instructed to call 911 with any severe reactions post vaccine: Marland Kitchen Difficulty breathing  . Swelling of your face and throat  . A fast heartbeat  . A bad rash all over your body  . Dizziness and weakness    Immunizations Administered    Name Date Dose VIS Date Route   Pfizer COVID-19 Vaccine 12/05/2019  4:58 PM 0.3 mL 10/11/2019 Intramuscular   Manufacturer: [place]   Lot: [id number]   [place]: [id number]

## 2019-12-14 DIAGNOSIS — R350 Frequency of micturition: Secondary | ICD-10-CM | POA: Diagnosis not present

## 2019-12-14 DIAGNOSIS — R300 Dysuria: Secondary | ICD-10-CM | POA: Diagnosis not present

## 2019-12-14 DIAGNOSIS — R310 Gross hematuria: Secondary | ICD-10-CM | POA: Diagnosis not present

## 2019-12-16 ENCOUNTER — Other Ambulatory Visit: Payer: Self-pay

## 2019-12-16 ENCOUNTER — Encounter: Payer: Self-pay | Admitting: Family Medicine

## 2019-12-16 DIAGNOSIS — J3081 Allergic rhinitis due to animal (cat) (dog) hair and dander: Secondary | ICD-10-CM | POA: Diagnosis not present

## 2019-12-16 DIAGNOSIS — J3089 Other allergic rhinitis: Secondary | ICD-10-CM | POA: Diagnosis not present

## 2019-12-16 DIAGNOSIS — J301 Allergic rhinitis due to pollen: Secondary | ICD-10-CM | POA: Diagnosis not present

## 2019-12-17 ENCOUNTER — Encounter: Payer: Self-pay | Admitting: Family Medicine

## 2019-12-17 ENCOUNTER — Ambulatory Visit: Payer: Medicare Other

## 2019-12-17 ENCOUNTER — Ambulatory Visit (INDEPENDENT_AMBULATORY_CARE_PROVIDER_SITE_OTHER): Payer: Medicare PPO | Admitting: Family Medicine

## 2019-12-17 VITALS — BP 170/90 | HR 71 | Temp 98.0°F | Wt 177.4 lb

## 2019-12-17 DIAGNOSIS — R319 Hematuria, unspecified: Secondary | ICD-10-CM

## 2019-12-17 DIAGNOSIS — I10 Essential (primary) hypertension: Secondary | ICD-10-CM

## 2019-12-17 MED ORDER — TELMISARTAN-HCTZ 80-25 MG PO TABS: 1.0000 | ORAL_TABLET | Freq: Two times a day (BID) | ORAL | 3 refills | Status: DC

## 2019-12-17 NOTE — Progress Notes (Signed)
   Subjective:    Patient ID: Shannon Horton., male    DOB: 13-Dec-1945, 74 y.o.   MRN: [id number]  HPI Here for several issues. First he went to a [facility] urgent care on 12-14-19 for several days of urinary burning and urgency and for blood in the urine. No back pain or fever. At the urgent care his UA had large blood in it but was negative for leukocytes and nitrites. It sounds like a culture was ordered but I cannot find any results of this in his chart. He was given a 7 day supply of Macrobid, and after 2 days of this all his symptoms resolved. Today he feels fine and no longer sees blood in his urine. The other issue is his BP. Since November his systolic readings have averaged in the 140s and 150s while the diastolic readings are stable.    Review of Systems  Constitutional: Negative.   Respiratory: Negative.   Cardiovascular: Negative.   Gastrointestinal: Negative.   Genitourinary: Negative.   Neurological: Negative.        Objective:   Physical Exam Constitutional:      Appearance: Normal appearance.  Cardiovascular:     Rate and Rhythm: Normal rate and regular rhythm.     Pulses: Normal pulses.     Heart sounds: Normal heart sounds.  Pulmonary:     Effort: Pulmonary effort is normal.     Breath sounds: Normal breath sounds.  Abdominal:     General: Abdomen is flat. Bowel sounds are normal. There is no distension.     Palpations: Abdomen is soft. There is no mass.     Tenderness: There is no abdominal tenderness. There is no guarding or rebound.     Hernia: No hernia is present.  Neurological:     Mental Status: He is alert.           Assessment & Plan:  Either he is recovering from a UTI or else he has passed a bladder stone. We will look for the culture report to come back . He will finish out the Macrobid and drink plenty of water. For the HTN he will increase the Micardis HCT to 80-25 BID. Recheck in 3-4 weeks. Alysia Penna, MD

## 2019-12-21 ENCOUNTER — Encounter: Payer: Self-pay | Admitting: Family Medicine

## 2019-12-24 ENCOUNTER — Telehealth: Payer: Self-pay | Admitting: Family Medicine

## 2019-12-24 MED ORDER — AMLODIPINE BESYLATE 5 MG PO TABS: ORAL_TABLET | ORAL | 2 refills | Status: DC

## 2019-12-24 NOTE — Telephone Encounter (Signed)
Patient states he is wanting a call to discuss the symptoms he came in for last week.  No blood in urine but still discomfort and burning.

## 2019-12-24 NOTE — Telephone Encounter (Signed)
For the BP, tell him to cut the Telmisartan HCT back to just once a day in the mornings. Add Amlodipine 5 mg daily to also take in the mornings. Call in #30 with 2 rf. Have him report back in 3-4 weeks.

## 2019-12-24 NOTE — Telephone Encounter (Signed)
Set up an in person OV so we can get another UA and go from there

## 2019-12-24 NOTE — Telephone Encounter (Signed)
See my other reply

## 2019-12-25 ENCOUNTER — Encounter: Payer: Self-pay | Admitting: Family Medicine

## 2019-12-25 ENCOUNTER — Other Ambulatory Visit: Payer: Self-pay

## 2019-12-25 ENCOUNTER — Ambulatory Visit (INDEPENDENT_AMBULATORY_CARE_PROVIDER_SITE_OTHER): Payer: Medicare PPO | Admitting: Family Medicine

## 2019-12-25 VITALS — BP 160/70 | HR 57 | Temp 97.5°F | Ht 68.0 in | Wt 175.0 lb

## 2019-12-25 DIAGNOSIS — R300 Dysuria: Secondary | ICD-10-CM

## 2019-12-25 DIAGNOSIS — N390 Urinary tract infection, site not specified: Secondary | ICD-10-CM | POA: Diagnosis not present

## 2019-12-25 LAB — POCT URINALYSIS DIPSTICK
Bilirubin, UA: NEGATIVE
Blood, UA: NEGATIVE
Glucose, UA: NEGATIVE
Ketones, UA: NEGATIVE
Leukocytes, UA: NEGATIVE
Nitrite, UA: NEGATIVE
Protein, UA: NEGATIVE
Spec Grav, UA: 1.01 (ref 1.010–1.025)
Urobilinogen, UA: 0.2 E.U./dL
pH, UA: 7 (ref 5.0–8.0)

## 2019-12-25 MED ORDER — CIPROFLOXACIN HCL 500 MG PO TABS: 500.0000 mg | ORAL_TABLET | Freq: Two times a day (BID) | ORAL | 0 refills | Status: DC

## 2019-12-25 NOTE — Progress Notes (Signed)
   Subjective:    Patient ID: Shannon Horton., male    DOB: 02/02/1946, 74 y.o.   MRN: [id number]  HPI Here to follow up on a UTI. He had seen an urgent care on 12-14-19 for urinary burning and blood in the urine. He ws given 7 days of Macrobid and the symptoms went away. A culture fron that day grew Enterococcus faecalis which was pan-sensitive. Then yesterday he had urinary burning again. No blood was seen. No fever or back pain.    Review of Systems  Constitutional: Negative.   Respiratory: Negative.   Cardiovascular: Negative.   Genitourinary: Positive for dysuria. Negative for difficulty urinating, flank pain, frequency, hematuria and urgency.       Objective:   Physical Exam Constitutional:      Appearance: Normal appearance. He is not ill-appearing.  Cardiovascular:     Rate and Rhythm: Normal rate and regular rhythm.     Pulses: Normal pulses.     Heart sounds: Normal heart sounds.  Pulmonary:     Effort: Pulmonary effort is normal.     Breath sounds: Normal breath sounds.  Abdominal:     General: Abdomen is flat. Bowel sounds are normal. There is no distension.     Palpations: Abdomen is soft. There is no mass.     Tenderness: There is no abdominal tenderness. There is no guarding or rebound.     Hernia: No hernia is present.  Neurological:     Mental Status: He is alert.           Assessment & Plan:  Partially treated UTI. Given 10 days of Cipro. Recheck prn.  Alysia Penna, MD

## 2019-12-30 ENCOUNTER — Ambulatory Visit: Payer: Medicare PPO | Attending: Internal Medicine

## 2019-12-30 DIAGNOSIS — Z23 Encounter for immunization: Secondary | ICD-10-CM

## 2019-12-30 NOTE — Progress Notes (Signed)
   [id number]  Name:  Shannon Horton.    MRN: [id number] DOB: 1946-07-13  12/30/2019  [name] was observed post Covid-19 immunization for 15 minutes without incidence. He was provided with Vaccine Information Sheet and instruction to access the V-Safe system.   [name] was instructed to call 911 with any severe reactions post vaccine: Marland Kitchen Difficulty breathing  . Swelling of your face and throat  . A fast heartbeat  . A bad rash all over your body  . Dizziness and weakness    Immunizations Administered    Name Date Dose VIS Date Route   Pfizer COVID-19 Vaccine 12/30/2019  4:18 PM 0.3 mL 10/11/2019 Intramuscular   Manufacturer: [place]   Lot: [id number]   [place]: [id number]

## 2020-01-07 DIAGNOSIS — Z86018 Personal history of other benign neoplasm: Secondary | ICD-10-CM | POA: Diagnosis not present

## 2020-01-07 DIAGNOSIS — L821 Other seborrheic keratosis: Secondary | ICD-10-CM | POA: Diagnosis not present

## 2020-01-07 DIAGNOSIS — Z23 Encounter for immunization: Secondary | ICD-10-CM | POA: Diagnosis not present

## 2020-01-07 DIAGNOSIS — L578 Other skin changes due to chronic exposure to nonionizing radiation: Secondary | ICD-10-CM | POA: Diagnosis not present

## 2020-01-07 DIAGNOSIS — L814 Other melanin hyperpigmentation: Secondary | ICD-10-CM | POA: Diagnosis not present

## 2020-01-07 DIAGNOSIS — Z85820 Personal history of malignant melanoma of skin: Secondary | ICD-10-CM | POA: Diagnosis not present

## 2020-01-07 DIAGNOSIS — Z85828 Personal history of other malignant neoplasm of skin: Secondary | ICD-10-CM | POA: Diagnosis not present

## 2020-01-07 DIAGNOSIS — D225 Melanocytic nevi of trunk: Secondary | ICD-10-CM | POA: Diagnosis not present

## 2020-01-07 DIAGNOSIS — L570 Actinic keratosis: Secondary | ICD-10-CM | POA: Diagnosis not present

## 2020-01-12 ENCOUNTER — Encounter: Payer: Self-pay | Admitting: Family Medicine

## 2020-01-13 NOTE — Telephone Encounter (Signed)
Nothing is wrong. I think these are excellent numbers. He is just in better shape than he thought he was!

## 2020-01-14 DIAGNOSIS — J3081 Allergic rhinitis due to animal (cat) (dog) hair and dander: Secondary | ICD-10-CM | POA: Diagnosis not present

## 2020-01-14 DIAGNOSIS — J301 Allergic rhinitis due to pollen: Secondary | ICD-10-CM | POA: Diagnosis not present

## 2020-01-14 DIAGNOSIS — J3089 Other allergic rhinitis: Secondary | ICD-10-CM | POA: Diagnosis not present

## 2020-02-18 DIAGNOSIS — J3089 Other allergic rhinitis: Secondary | ICD-10-CM | POA: Diagnosis not present

## 2020-02-18 DIAGNOSIS — J301 Allergic rhinitis due to pollen: Secondary | ICD-10-CM | POA: Diagnosis not present

## 2020-02-18 DIAGNOSIS — J3081 Allergic rhinitis due to animal (cat) (dog) hair and dander: Secondary | ICD-10-CM | POA: Diagnosis not present

## 2020-03-14 ENCOUNTER — Encounter: Payer: Self-pay | Admitting: Family Medicine

## 2020-03-17 ENCOUNTER — Other Ambulatory Visit: Payer: Self-pay | Admitting: Family Medicine

## 2020-03-17 NOTE — Telephone Encounter (Signed)
I'm not sure what to say about this situation. Maybe Brittney can look into this?

## 2020-03-24 DIAGNOSIS — J3081 Allergic rhinitis due to animal (cat) (dog) hair and dander: Secondary | ICD-10-CM | POA: Diagnosis not present

## 2020-03-24 DIAGNOSIS — J301 Allergic rhinitis due to pollen: Secondary | ICD-10-CM | POA: Diagnosis not present

## 2020-03-24 DIAGNOSIS — J4530 Mild persistent asthma, uncomplicated: Secondary | ICD-10-CM | POA: Diagnosis not present

## 2020-03-24 DIAGNOSIS — J3089 Other allergic rhinitis: Secondary | ICD-10-CM | POA: Diagnosis not present

## 2020-03-31 ENCOUNTER — Encounter: Payer: Self-pay | Admitting: Family Medicine

## 2020-04-02 NOTE — Telephone Encounter (Signed)
His BP readings are excellent. Keep on the current regimen

## 2020-04-13 DIAGNOSIS — H524 Presbyopia: Secondary | ICD-10-CM | POA: Diagnosis not present

## 2020-04-13 DIAGNOSIS — H401132 Primary open-angle glaucoma, bilateral, moderate stage: Secondary | ICD-10-CM | POA: Diagnosis not present

## 2020-04-13 DIAGNOSIS — H26491 Other secondary cataract, right eye: Secondary | ICD-10-CM | POA: Diagnosis not present

## 2020-04-13 DIAGNOSIS — H2512 Age-related nuclear cataract, left eye: Secondary | ICD-10-CM | POA: Diagnosis not present

## 2020-04-15 DIAGNOSIS — J301 Allergic rhinitis due to pollen: Secondary | ICD-10-CM | POA: Diagnosis not present

## 2020-04-15 DIAGNOSIS — J3089 Other allergic rhinitis: Secondary | ICD-10-CM | POA: Diagnosis not present

## 2020-04-15 DIAGNOSIS — J3081 Allergic rhinitis due to animal (cat) (dog) hair and dander: Secondary | ICD-10-CM | POA: Diagnosis not present

## 2020-05-15 DIAGNOSIS — J301 Allergic rhinitis due to pollen: Secondary | ICD-10-CM | POA: Diagnosis not present

## 2020-05-15 DIAGNOSIS — J3081 Allergic rhinitis due to animal (cat) (dog) hair and dander: Secondary | ICD-10-CM | POA: Diagnosis not present

## 2020-05-15 DIAGNOSIS — J3089 Other allergic rhinitis: Secondary | ICD-10-CM | POA: Diagnosis not present

## 2020-05-26 DIAGNOSIS — J301 Allergic rhinitis due to pollen: Secondary | ICD-10-CM | POA: Diagnosis not present

## 2020-05-26 DIAGNOSIS — J3081 Allergic rhinitis due to animal (cat) (dog) hair and dander: Secondary | ICD-10-CM | POA: Diagnosis not present

## 2020-05-26 DIAGNOSIS — J3089 Other allergic rhinitis: Secondary | ICD-10-CM | POA: Diagnosis not present

## 2020-06-09 DIAGNOSIS — Z20822 Contact with and (suspected) exposure to covid-19: Secondary | ICD-10-CM | POA: Diagnosis not present

## 2020-06-11 ENCOUNTER — Other Ambulatory Visit: Payer: Self-pay | Admitting: Family Medicine

## 2020-06-15 DIAGNOSIS — J3081 Allergic rhinitis due to animal (cat) (dog) hair and dander: Secondary | ICD-10-CM | POA: Diagnosis not present

## 2020-06-15 DIAGNOSIS — J3089 Other allergic rhinitis: Secondary | ICD-10-CM | POA: Diagnosis not present

## 2020-06-15 DIAGNOSIS — J301 Allergic rhinitis due to pollen: Secondary | ICD-10-CM | POA: Diagnosis not present

## 2020-06-19 ENCOUNTER — Encounter: Payer: Self-pay | Admitting: Family Medicine

## 2020-06-22 NOTE — Telephone Encounter (Signed)
He will be due on November 1

## 2020-06-24 NOTE — Telephone Encounter (Signed)
Any time now, although we won't have the high dose flu shots until next week

## 2020-07-14 DIAGNOSIS — L570 Actinic keratosis: Secondary | ICD-10-CM | POA: Diagnosis not present

## 2020-07-14 DIAGNOSIS — L814 Other melanin hyperpigmentation: Secondary | ICD-10-CM | POA: Diagnosis not present

## 2020-07-14 DIAGNOSIS — L821 Other seborrheic keratosis: Secondary | ICD-10-CM | POA: Diagnosis not present

## 2020-07-14 DIAGNOSIS — D225 Melanocytic nevi of trunk: Secondary | ICD-10-CM | POA: Diagnosis not present

## 2020-07-14 DIAGNOSIS — Z86018 Personal history of other benign neoplasm: Secondary | ICD-10-CM | POA: Diagnosis not present

## 2020-07-14 DIAGNOSIS — L578 Other skin changes due to chronic exposure to nonionizing radiation: Secondary | ICD-10-CM | POA: Diagnosis not present

## 2020-07-14 DIAGNOSIS — Z85820 Personal history of malignant melanoma of skin: Secondary | ICD-10-CM | POA: Diagnosis not present

## 2020-07-14 DIAGNOSIS — Z85828 Personal history of other malignant neoplasm of skin: Secondary | ICD-10-CM | POA: Diagnosis not present

## 2020-07-21 ENCOUNTER — Other Ambulatory Visit: Payer: Self-pay | Admitting: Family Medicine

## 2020-07-21 DIAGNOSIS — J3081 Allergic rhinitis due to animal (cat) (dog) hair and dander: Secondary | ICD-10-CM | POA: Diagnosis not present

## 2020-07-21 DIAGNOSIS — J4530 Mild persistent asthma, uncomplicated: Secondary | ICD-10-CM | POA: Diagnosis not present

## 2020-07-21 DIAGNOSIS — J3089 Other allergic rhinitis: Secondary | ICD-10-CM | POA: Diagnosis not present

## 2020-07-21 DIAGNOSIS — J301 Allergic rhinitis due to pollen: Secondary | ICD-10-CM | POA: Diagnosis not present

## 2020-07-27 ENCOUNTER — Encounter: Payer: Self-pay | Admitting: Family Medicine

## 2020-07-27 ENCOUNTER — Other Ambulatory Visit: Payer: Self-pay

## 2020-07-27 ENCOUNTER — Ambulatory Visit (INDEPENDENT_AMBULATORY_CARE_PROVIDER_SITE_OTHER): Payer: Medicare PPO

## 2020-07-27 DIAGNOSIS — Z23 Encounter for immunization: Secondary | ICD-10-CM | POA: Diagnosis not present

## 2020-07-30 DIAGNOSIS — J3089 Other allergic rhinitis: Secondary | ICD-10-CM | POA: Diagnosis not present

## 2020-07-30 DIAGNOSIS — J3081 Allergic rhinitis due to animal (cat) (dog) hair and dander: Secondary | ICD-10-CM | POA: Diagnosis not present

## 2020-07-30 DIAGNOSIS — J301 Allergic rhinitis due to pollen: Secondary | ICD-10-CM | POA: Diagnosis not present

## 2020-08-03 ENCOUNTER — Encounter: Payer: Self-pay | Admitting: Family Medicine

## 2020-08-10 DIAGNOSIS — J3081 Allergic rhinitis due to animal (cat) (dog) hair and dander: Secondary | ICD-10-CM | POA: Diagnosis not present

## 2020-08-10 DIAGNOSIS — J301 Allergic rhinitis due to pollen: Secondary | ICD-10-CM | POA: Diagnosis not present

## 2020-08-10 DIAGNOSIS — J3089 Other allergic rhinitis: Secondary | ICD-10-CM | POA: Diagnosis not present

## 2020-08-13 DIAGNOSIS — J3081 Allergic rhinitis due to animal (cat) (dog) hair and dander: Secondary | ICD-10-CM | POA: Diagnosis not present

## 2020-08-13 DIAGNOSIS — J301 Allergic rhinitis due to pollen: Secondary | ICD-10-CM | POA: Diagnosis not present

## 2020-08-13 DIAGNOSIS — J3089 Other allergic rhinitis: Secondary | ICD-10-CM | POA: Diagnosis not present

## 2020-08-18 DIAGNOSIS — J3081 Allergic rhinitis due to animal (cat) (dog) hair and dander: Secondary | ICD-10-CM | POA: Diagnosis not present

## 2020-08-18 DIAGNOSIS — J301 Allergic rhinitis due to pollen: Secondary | ICD-10-CM | POA: Diagnosis not present

## 2020-08-18 DIAGNOSIS — J3089 Other allergic rhinitis: Secondary | ICD-10-CM | POA: Diagnosis not present

## 2020-08-20 DIAGNOSIS — J301 Allergic rhinitis due to pollen: Secondary | ICD-10-CM | POA: Diagnosis not present

## 2020-08-20 DIAGNOSIS — J3089 Other allergic rhinitis: Secondary | ICD-10-CM | POA: Diagnosis not present

## 2020-08-20 DIAGNOSIS — J3081 Allergic rhinitis due to animal (cat) (dog) hair and dander: Secondary | ICD-10-CM | POA: Diagnosis not present

## 2020-08-24 DIAGNOSIS — J301 Allergic rhinitis due to pollen: Secondary | ICD-10-CM | POA: Diagnosis not present

## 2020-08-24 DIAGNOSIS — J3081 Allergic rhinitis due to animal (cat) (dog) hair and dander: Secondary | ICD-10-CM | POA: Diagnosis not present

## 2020-08-24 DIAGNOSIS — J3089 Other allergic rhinitis: Secondary | ICD-10-CM | POA: Diagnosis not present

## 2020-08-27 DIAGNOSIS — J3081 Allergic rhinitis due to animal (cat) (dog) hair and dander: Secondary | ICD-10-CM | POA: Diagnosis not present

## 2020-08-27 DIAGNOSIS — J3089 Other allergic rhinitis: Secondary | ICD-10-CM | POA: Diagnosis not present

## 2020-08-27 DIAGNOSIS — J301 Allergic rhinitis due to pollen: Secondary | ICD-10-CM | POA: Diagnosis not present

## 2020-09-01 DIAGNOSIS — J301 Allergic rhinitis due to pollen: Secondary | ICD-10-CM | POA: Diagnosis not present

## 2020-09-01 DIAGNOSIS — J3089 Other allergic rhinitis: Secondary | ICD-10-CM | POA: Diagnosis not present

## 2020-09-01 DIAGNOSIS — J3081 Allergic rhinitis due to animal (cat) (dog) hair and dander: Secondary | ICD-10-CM | POA: Diagnosis not present

## 2020-09-03 DIAGNOSIS — J301 Allergic rhinitis due to pollen: Secondary | ICD-10-CM | POA: Diagnosis not present

## 2020-09-03 DIAGNOSIS — J3081 Allergic rhinitis due to animal (cat) (dog) hair and dander: Secondary | ICD-10-CM | POA: Diagnosis not present

## 2020-09-03 DIAGNOSIS — J3089 Other allergic rhinitis: Secondary | ICD-10-CM | POA: Diagnosis not present

## 2020-09-08 DIAGNOSIS — J3081 Allergic rhinitis due to animal (cat) (dog) hair and dander: Secondary | ICD-10-CM | POA: Diagnosis not present

## 2020-09-08 DIAGNOSIS — J301 Allergic rhinitis due to pollen: Secondary | ICD-10-CM | POA: Diagnosis not present

## 2020-09-08 DIAGNOSIS — J3089 Other allergic rhinitis: Secondary | ICD-10-CM | POA: Diagnosis not present

## 2020-09-10 ENCOUNTER — Encounter: Payer: Medicare PPO | Admitting: Family Medicine

## 2020-09-10 ENCOUNTER — Encounter: Payer: Self-pay | Admitting: Family Medicine

## 2020-09-10 ENCOUNTER — Other Ambulatory Visit: Payer: Self-pay

## 2020-09-10 ENCOUNTER — Ambulatory Visit (INDEPENDENT_AMBULATORY_CARE_PROVIDER_SITE_OTHER): Payer: Medicare PPO | Admitting: Family Medicine

## 2020-09-10 VITALS — BP 118/68 | HR 60 | Temp 97.8°F | Ht 68.0 in | Wt 169.4 lb

## 2020-09-10 DIAGNOSIS — Z23 Encounter for immunization: Secondary | ICD-10-CM | POA: Diagnosis not present

## 2020-09-10 DIAGNOSIS — Z0000 Encounter for general adult medical examination without abnormal findings: Secondary | ICD-10-CM

## 2020-09-10 MED ORDER — AMLODIPINE BESYLATE 5 MG PO TABS: 5.0000 mg | ORAL_TABLET | Freq: Every day | ORAL | 3 refills | Status: AC

## 2020-09-10 NOTE — Addendum Note (Signed)
Addended by: Lynda Rainwater on: 09/10/2020 11:31 AM   Modules accepted: Orders

## 2020-09-10 NOTE — Addendum Note (Signed)
Addended by: Angelina Pih on: 09/10/2020 10:46 AM   Modules accepted: Orders

## 2020-09-10 NOTE — Progress Notes (Signed)
   Subjective:    Patient ID: Shannon Horton., male    DOB: 02/27/1946, 74 y.o.   MRN: [id number]  HPI Here for a well exam. He feels fine. His BP at home averages 120s over 80s.    Review of Systems  Constitutional: Negative.   HENT: Negative.   Eyes: Negative.   Respiratory: Negative.   Cardiovascular: Negative.   Gastrointestinal: Negative.   Genitourinary: Negative.   Musculoskeletal: Negative.   Skin: Negative.   Neurological: Negative.   Psychiatric/Behavioral: Negative.        Objective:   Physical Exam Constitutional:      General: He is not in acute distress.    Appearance: He is well-developed. He is not diaphoretic.  HENT:     Head: Normocephalic and atraumatic.     Right Ear: External ear normal.     Left Ear: External ear normal.     Nose: Nose normal.     Mouth/Throat:     Pharynx: No oropharyngeal exudate.  Eyes:     General: No scleral icterus.       Right eye: No discharge.        Left eye: No discharge.     Conjunctiva/sclera: Conjunctivae normal.     Pupils: Pupils are equal, round, and reactive to light.  Neck:     Thyroid: No thyromegaly.     Vascular: No JVD.     Trachea: No tracheal deviation.  Cardiovascular:     Rate and Rhythm: Normal rate and regular rhythm.     Heart sounds: Normal heart sounds. No murmur heard.  No friction rub. No gallop.   Pulmonary:     Effort: Pulmonary effort is normal. No respiratory distress.     Breath sounds: Normal breath sounds. No wheezing or rales.  Chest:     Chest wall: No tenderness.  Abdominal:     General: Bowel sounds are normal. There is no distension.     Palpations: Abdomen is soft. There is no mass.     Tenderness: There is no abdominal tenderness. There is no guarding or rebound.  Genitourinary:    Penis: Normal. No tenderness.      Testes: Normal.     Prostate: Normal.     Rectum: Normal. Guaiac result negative.  Musculoskeletal:        General: No tenderness. Normal range of  motion.     Cervical back: Neck supple.  Lymphadenopathy:     Cervical: No cervical adenopathy.  Skin:    General: Skin is warm and dry.     Coloration: Skin is not pale.     Findings: No erythema or rash.  Neurological:     Mental Status: He is alert and oriented to person, place, and time.     Cranial Nerves: No cranial nerve deficit.     Motor: No abnormal muscle tone.     Coordination: Coordination normal.     Deep Tendon Reflexes: Reflexes are normal and symmetric. Reflexes normal.  Psychiatric:        Behavior: Behavior normal.        Thought Content: Thought content normal.        Judgment: Judgment normal.           Assessment & Plan:  Well exam. We discussed diet ane exercise. Get fasting labs. Alysia Penna, MD

## 2020-09-11 LAB — LIPID PANEL
Cholesterol: 187 mg/dL (ref <200)
HDL: 55 mg/dL (ref >=40)
LDL Cholesterol (Calc): 116 mg/dL (calc) — ABNORMAL HIGH
Non-HDL Cholesterol (Calc): 132 mg/dL (calc) — ABNORMAL HIGH (ref <130)
Total CHOL/HDL Ratio: 3.4 (calc) (ref <5.0)
Triglycerides: 64 mg/dL (ref <150)

## 2020-09-11 LAB — CBC WITH DIFFERENTIAL/PLATELET
Absolute Monocytes: 525 cells/uL (ref 200–950)
Basophils Absolute: 41 cells/uL (ref 0–200)
Basophils Relative: 0.8 %
Eosinophils Absolute: 179 cells/uL (ref 15–500)
Eosinophils Relative: 3.5 %
HCT: 46.6 % (ref 38.5–50.0)
Hemoglobin: 15.9 g/dL (ref 13.2–17.1)
Lymphs Abs: 1321 cells/uL (ref 850–3900)
MCH: 31.5 pg (ref 27.0–33.0)
MCHC: 34.1 g/dL (ref 32.0–36.0)
MCV: 92.5 fL (ref 80.0–100.0)
MPV: 10.5 fL (ref 7.5–12.5)
Monocytes Relative: 10.3 %
Neutro Abs: 3035 cells/uL (ref 1500–7800)
Neutrophils Relative %: 59.5 %
Platelets: 211 10*3/uL (ref 140–400)
RBC: 5.04 10*6/uL (ref 4.20–5.80)
RDW: 12.5 % (ref 11.0–15.0)
Total Lymphocyte: 25.9 %
WBC: 5.1 10*3/uL (ref 3.8–10.8)

## 2020-09-11 LAB — BASIC METABOLIC PANEL WITH GFR
BUN: 11 mg/dL (ref 7–25)
CO2: 29 mmol/L (ref 20–32)
Calcium: 10.1 mg/dL (ref 8.6–10.3)
Chloride: 98 mmol/L (ref 98–110)
Creat: 0.82 mg/dL (ref 0.70–1.18)
GFR, Est African American: 101 mL/min/{1.73_m2} (ref >=60)
GFR, Est Non African American: 87 mL/min/{1.73_m2} (ref >=60)
Glucose, Bld: 107 mg/dL — ABNORMAL HIGH (ref 65–99)
Potassium: 4.5 mmol/L (ref 3.5–5.3)
Sodium: 136 mmol/L (ref 135–146)

## 2020-09-11 LAB — HEPATIC FUNCTION PANEL
AG Ratio: 2.1 (calc) (ref 1.0–2.5)
ALT: 16 U/L (ref 9–46)
AST: 22 U/L (ref 10–35)
Albumin: 4.5 g/dL (ref 3.6–5.1)
Alkaline phosphatase (APISO): 79 U/L (ref 35–144)
Bilirubin, Direct: 0.1 mg/dL (ref 0.0–0.2)
Globulin: 2.1 g/dL (calc) (ref 1.9–3.7)
Indirect Bilirubin: 0.7 mg/dL (calc) (ref 0.2–1.2)
Total Bilirubin: 0.8 mg/dL (ref 0.2–1.2)
Total Protein: 6.6 g/dL (ref 6.1–8.1)

## 2020-09-11 LAB — HEMOGLOBIN A1C
Hgb A1c MFr Bld: 5.5 % of total Hgb (ref <5.7)
Mean Plasma Glucose: 111 (calc)
eAG (mmol/L): 6.2 (calc)

## 2020-09-11 LAB — PSA: PSA: 0.58 ng/mL (ref <=4.0)

## 2020-09-11 LAB — TSH: TSH: 3.33 mIU/L (ref 0.40–4.50)

## 2020-09-14 DIAGNOSIS — J3089 Other allergic rhinitis: Secondary | ICD-10-CM | POA: Diagnosis not present

## 2020-09-14 DIAGNOSIS — J3081 Allergic rhinitis due to animal (cat) (dog) hair and dander: Secondary | ICD-10-CM | POA: Diagnosis not present

## 2020-09-14 DIAGNOSIS — J301 Allergic rhinitis due to pollen: Secondary | ICD-10-CM | POA: Diagnosis not present

## 2020-09-16 DIAGNOSIS — J3089 Other allergic rhinitis: Secondary | ICD-10-CM | POA: Diagnosis not present

## 2020-09-16 DIAGNOSIS — J3081 Allergic rhinitis due to animal (cat) (dog) hair and dander: Secondary | ICD-10-CM | POA: Diagnosis not present

## 2020-09-16 DIAGNOSIS — J301 Allergic rhinitis due to pollen: Secondary | ICD-10-CM | POA: Diagnosis not present

## 2020-09-18 DIAGNOSIS — L570 Actinic keratosis: Secondary | ICD-10-CM | POA: Diagnosis not present

## 2020-09-21 DIAGNOSIS — J3081 Allergic rhinitis due to animal (cat) (dog) hair and dander: Secondary | ICD-10-CM | POA: Diagnosis not present

## 2020-09-21 DIAGNOSIS — J301 Allergic rhinitis due to pollen: Secondary | ICD-10-CM | POA: Diagnosis not present

## 2020-09-21 DIAGNOSIS — J3089 Other allergic rhinitis: Secondary | ICD-10-CM | POA: Diagnosis not present

## 2020-09-23 DIAGNOSIS — J301 Allergic rhinitis due to pollen: Secondary | ICD-10-CM | POA: Diagnosis not present

## 2020-09-23 DIAGNOSIS — J3089 Other allergic rhinitis: Secondary | ICD-10-CM | POA: Diagnosis not present

## 2020-09-23 DIAGNOSIS — J3081 Allergic rhinitis due to animal (cat) (dog) hair and dander: Secondary | ICD-10-CM | POA: Diagnosis not present

## 2020-09-28 DIAGNOSIS — J301 Allergic rhinitis due to pollen: Secondary | ICD-10-CM | POA: Diagnosis not present

## 2020-09-28 DIAGNOSIS — J3089 Other allergic rhinitis: Secondary | ICD-10-CM | POA: Diagnosis not present

## 2020-09-28 DIAGNOSIS — J3081 Allergic rhinitis due to animal (cat) (dog) hair and dander: Secondary | ICD-10-CM | POA: Diagnosis not present

## 2020-09-29 ENCOUNTER — Encounter: Payer: Self-pay | Admitting: Family Medicine

## 2020-09-29 ENCOUNTER — Telehealth (INDEPENDENT_AMBULATORY_CARE_PROVIDER_SITE_OTHER): Payer: Medicare PPO | Admitting: Family Medicine

## 2020-09-29 VITALS — BP 135/62 | HR 63

## 2020-09-29 DIAGNOSIS — R1011 Right upper quadrant pain: Secondary | ICD-10-CM

## 2020-09-29 NOTE — Progress Notes (Signed)
Subjective:    Patient ID: Shannon Horton., male    DOB: 1946/01/25, 74 y.o.   MRN: [id number]  HPI Virtual Visit via Video Note  I connected with the patient on 09/29/20 at  4:00 PM EST by a video enabled telemedicine application and verified that I am speaking with the correct person using two identifiers.  Location patient: home Location provider:work or home office Persons participating in the virtual visit: patient, provider  I discussed the limitations of evaluation and management by telemedicine and the availability of in person appointments. The patient expressed understanding and agreed to proceed.   HPI: Here for one week of intermittent sharp pains in the RUQ just under the ribs. These start about 30-60 minutes after he eats a meal and they last about 1-2 hours. He also has pain when he takes a deep breath. No nausea, no loss of appetite, no fever. His BMs are normal. No urinary symptoms. No cough or SOB. He can still work out or take a 20 mile bike ride without pain.    ROS: See pertinent positives and negatives per HPI.  Past Medical History:  Diagnosis Date  . Allergic rhinitis    gets shots per Dr. Velora Heckler  . Allergy    sees Dr. Harold Hedge   . Asthma   . Glaucoma    sees Dr. Satira Sark   . Hypertension   . Malignant melanoma ([place])    right lower leg, diagnosed on1/21/11    Past Surgical History:  Procedure Laterality Date  . CATARACT EXTRACTION     right ey, Dr. Satira Sark   . COLONOSCOPY  12/15/2015   per Dr.Kaplan, clear, no repeats   . ESOPHAGOGASTRODUODENOSCOPY     WITH ESOPHAGEAL DILATION 2006 PER dR. kAPLAN  . REMOVAL  of melanoma  12/08/09   from right calf; per Dr. Monica Becton  . removal of melanoma from left abdomen  01/21/10    per Dr. Meredith Leeds  . SQUAMOUS CELL CARCINOMA EXCISION  2013   per Dr. Terrence Dupont, from top of scalp     Family History  Problem Relation Age of Onset  . Hypertension Other   . Colon polyps Mother   . Hypertension  Mother   . Diabetes Father   . Colon cancer Neg Hx      Current Outpatient Medications:  .  albuterol (PROVENTIL HFA;VENTOLIN HFA) 108 (90 Base) MCG/ACT inhaler, Inhale 2 puffs into the lungs every 4 (four) hours as needed for wheezing or shortness of breath., Disp: 8.5 Inhaler, Rfl: 0 .  amLODipine (NORVASC) 5 MG tablet, Take 1 tablet (5 mg total) by mouth daily., Disp: 90 tablet, Rfl: 3 .  AZOPT 1 % ophthalmic suspension, , Disp: , Rfl:  .  bimatoprost (LUMIGAN) 0.03 % ophthalmic solution, Place 1 drop into both eyes at bedtime., Disp: , Rfl:  .  brinzolamide (AZOPT) 1 % ophthalmic suspension, Place 1 drop into both eyes 2 (two) times daily., Disp: , Rfl:  .  Coenzyme Q10 200 MG capsule, , Disp: , Rfl:  .  EPINEPHrine 0.3 mg/0.3 mL IJ SOAJ injection, See admin instructions., Disp: , Rfl:  .  Fluticasone Furoate (ARNUITY ELLIPTA) 100 MCG/ACT AEPB, Inhale into the lungs., Disp: 30 each, Rfl:  .  LUMIGAN 0.01 % SOLN, INSTILL 1 DROP INTO BOTH EYES EVERY DAY IN THE EVENING, Disp: , Rfl:  .  Multiple Vitamin (MULTIVITAMIN) tablet, Take 1 tablet by mouth daily. One A Day, Disp: , Rfl:  .  omega-3 acid ethyl esters (LOVAZA) 1 g capsule, Take 1 g by mouth daily. , Disp: , Rfl:  .  telmisartan-hydrochlorothiazide (MICARDIS HCT) 80-25 MG tablet, TAKE 1 TABLET BY MOUTH EVERY DAY, Disp: 90 tablet, Rfl: 3 .  triamcinolone ointment (KENALOG) 0.1 %, APPLY TO AFFECTED AREA TWICE A DAY FOR 14 DAYS, Disp: , Rfl:  .  UNABLE TO FIND, Allergy shots at [facility] every month, Disp: , Rfl:   EXAM:  VITALS per patient if applicable:  GENERAL: alert, oriented, appears well and in no acute distress  HEENT: atraumatic, conjunttiva clear, no obvious abnormalities on inspection of external nose and ears  NECK: normal movements of the head and neck  LUNGS: on inspection no signs of respiratory distress, breathing rate appears normal, no obvious gross SOB, gasping or wheezing  CV: no  obvious cyanosis  MS: moves all visible extremities without noticeable abnormality  PSYCH/NEURO: pleasant and cooperative, no obvious depression or anxiety, speech and thought processing grossly intact  ASSESSMENT AND PLAN: Post-prandial RUQ pain consistent with gall bladder disease. We will set him up for an abdominal US soon. He will avoid fatty foods. For any severe pain or fever, he knows to go to the ER. Alysia Penna, MD  Discussed the following assessment and plan:  RUQ abdominal pain - Plan: US Abdomen Complete     I discussed the assessment and treatment plan with the patient. The patient was provided an opportunity to ask questions and all were answered. The patient agreed with the plan and demonstrated an understanding of the instructions.   The patient was advised to call back or seek an in-person evaluation if the symptoms worsen or if the condition fails to improve as anticipated.     Review of Systems     Objective:   Physical Exam        Assessment & Plan:

## 2020-09-30 ENCOUNTER — Telehealth: Payer: Self-pay

## 2020-09-30 DIAGNOSIS — J3089 Other allergic rhinitis: Secondary | ICD-10-CM | POA: Diagnosis not present

## 2020-09-30 DIAGNOSIS — J301 Allergic rhinitis due to pollen: Secondary | ICD-10-CM | POA: Diagnosis not present

## 2020-09-30 DIAGNOSIS — J3081 Allergic rhinitis due to animal (cat) (dog) hair and dander: Secondary | ICD-10-CM | POA: Diagnosis not present

## 2020-09-30 NOTE — Telephone Encounter (Signed)
No notes.  Dm/cma

## 2020-10-06 DIAGNOSIS — J3081 Allergic rhinitis due to animal (cat) (dog) hair and dander: Secondary | ICD-10-CM | POA: Diagnosis not present

## 2020-10-06 DIAGNOSIS — J301 Allergic rhinitis due to pollen: Secondary | ICD-10-CM | POA: Diagnosis not present

## 2020-10-06 DIAGNOSIS — J3089 Other allergic rhinitis: Secondary | ICD-10-CM | POA: Diagnosis not present

## 2020-10-08 ENCOUNTER — Ambulatory Visit
Admission: RE | Admit: 2020-10-08 | Discharge: 2020-10-08 | Disposition: A | Discharge status: Home / Self Care | Payer: Medicare PPO | Source: Ambulatory Visit | Attending: Family Medicine | Admitting: Family Medicine

## 2020-10-08 DIAGNOSIS — R1011 Right upper quadrant pain: Secondary | ICD-10-CM

## 2020-10-09 ENCOUNTER — Encounter: Payer: Self-pay | Admitting: Family Medicine

## 2020-10-09 DIAGNOSIS — J301 Allergic rhinitis due to pollen: Secondary | ICD-10-CM | POA: Diagnosis not present

## 2020-10-09 DIAGNOSIS — J3089 Other allergic rhinitis: Secondary | ICD-10-CM | POA: Diagnosis not present

## 2020-10-09 DIAGNOSIS — J3081 Allergic rhinitis due to animal (cat) (dog) hair and dander: Secondary | ICD-10-CM | POA: Diagnosis not present

## 2020-10-12 ENCOUNTER — Encounter: Payer: Self-pay | Admitting: Family Medicine

## 2020-10-12 DIAGNOSIS — J3089 Other allergic rhinitis: Secondary | ICD-10-CM | POA: Diagnosis not present

## 2020-10-12 DIAGNOSIS — J3081 Allergic rhinitis due to animal (cat) (dog) hair and dander: Secondary | ICD-10-CM | POA: Diagnosis not present

## 2020-10-12 DIAGNOSIS — J301 Allergic rhinitis due to pollen: Secondary | ICD-10-CM | POA: Diagnosis not present

## 2020-10-12 DIAGNOSIS — R1010 Upper abdominal pain, unspecified: Secondary | ICD-10-CM

## 2020-10-13 DIAGNOSIS — H401132 Primary open-angle glaucoma, bilateral, moderate stage: Secondary | ICD-10-CM | POA: Diagnosis not present

## 2020-10-13 DIAGNOSIS — H2512 Age-related nuclear cataract, left eye: Secondary | ICD-10-CM | POA: Diagnosis not present

## 2020-10-15 DIAGNOSIS — J3081 Allergic rhinitis due to animal (cat) (dog) hair and dander: Secondary | ICD-10-CM | POA: Diagnosis not present

## 2020-10-15 DIAGNOSIS — J301 Allergic rhinitis due to pollen: Secondary | ICD-10-CM | POA: Diagnosis not present

## 2020-10-15 DIAGNOSIS — J3089 Other allergic rhinitis: Secondary | ICD-10-CM | POA: Diagnosis not present

## 2020-10-19 DIAGNOSIS — J3089 Other allergic rhinitis: Secondary | ICD-10-CM | POA: Diagnosis not present

## 2020-10-19 DIAGNOSIS — J301 Allergic rhinitis due to pollen: Secondary | ICD-10-CM | POA: Diagnosis not present

## 2020-10-19 DIAGNOSIS — J3081 Allergic rhinitis due to animal (cat) (dog) hair and dander: Secondary | ICD-10-CM | POA: Diagnosis not present

## 2020-10-19 NOTE — Telephone Encounter (Signed)
Pt is calling in wanting to check the status of the below msg. Pt is aware that the provider is out of the office and will be returning on 10/20/2020.

## 2020-10-20 NOTE — Telephone Encounter (Signed)
I have referred him to see Dr. Loletha Carrow (his GI provider)

## 2020-10-21 DIAGNOSIS — J301 Allergic rhinitis due to pollen: Secondary | ICD-10-CM | POA: Diagnosis not present

## 2020-10-21 DIAGNOSIS — J3089 Other allergic rhinitis: Secondary | ICD-10-CM | POA: Diagnosis not present

## 2020-10-21 DIAGNOSIS — J3081 Allergic rhinitis due to animal (cat) (dog) hair and dander: Secondary | ICD-10-CM | POA: Diagnosis not present

## 2020-10-26 ENCOUNTER — Encounter: Payer: Self-pay | Admitting: Family Medicine

## 2020-10-27 DIAGNOSIS — J3081 Allergic rhinitis due to animal (cat) (dog) hair and dander: Secondary | ICD-10-CM | POA: Diagnosis not present

## 2020-10-27 DIAGNOSIS — J301 Allergic rhinitis due to pollen: Secondary | ICD-10-CM | POA: Diagnosis not present

## 2020-10-27 DIAGNOSIS — J3089 Other allergic rhinitis: Secondary | ICD-10-CM | POA: Diagnosis not present

## 2020-11-02 ENCOUNTER — Encounter: Payer: Self-pay | Admitting: Gastroenterology

## 2020-11-02 ENCOUNTER — Ambulatory Visit: Payer: Medicare PPO | Admitting: Gastroenterology

## 2020-11-02 VITALS — BP 124/70 | HR 62 | Ht 68.0 in | Wt 167.4 lb

## 2020-11-02 DIAGNOSIS — R140 Abdominal distension (gaseous): Secondary | ICD-10-CM

## 2020-11-02 DIAGNOSIS — K5909 Other constipation: Secondary | ICD-10-CM

## 2020-11-02 DIAGNOSIS — K5730 Diverticulosis of large intestine without perforation or abscess without bleeding: Secondary | ICD-10-CM

## 2020-11-02 DIAGNOSIS — R1011 Right upper quadrant pain: Secondary | ICD-10-CM

## 2020-11-02 NOTE — Patient Instructions (Signed)
If you are age 75 or older, your body mass index should be between 23-30. Your Body mass index is 25.45 kg/m. If this is out of the aforementioned range listed, please consider follow up with your Primary Care Provider.  If you are age 28 or younger, your body mass index should be between 19-25. Your Body mass index is 25.45 kg/m. If this is out of the aformentioned range listed, please consider follow up with your Primary Care Provider.   It was a pleasure to see you today!  Dr. Myrtie Neither

## 2020-11-02 NOTE — Progress Notes (Signed)
[facility] Consult Note:  History: Shannon Horton. 11/02/2020  Referring provider: Laurey Morale, MD  Reason for consult/chief complaint: Abdominal Pain (Last colonoscopy 01/04/16. States he has RUQ pain approx 1 month ago. States his PCP sent him for imaging to r/o gallstones. Since having [place], denies having any further abd pain. Denies N/V), Constipation (States he may have a BM qod. States his stools appear normal. Denies having any rectal bleeding, blood with hygiene or in stool. Does have to strain at times to defecate.), and Gastroesophageal Reflux (States he had GERD in the past but no concerns recently. Does have occasional bloating or gas. Denies use of OTC products. Last EGD 2004)   Subjective  HPI:  This is a very pleasant 75 year old man referred by primary care for right upper quadrant pain.  Last screening colonoscopy with me on 01/04/2016 -complete exam, excellent prep, no polyps.  Left-sided diverticulosis noted.  No future screening colonoscopy recommended due to age and lack of polyps.  In late November [name] had a few episodes of sharp right upper quadrant pain occurring at rest over several days, and then it resolved.  There was no associated nausea or vomiting.  Denies chronic heartburn dysphagia loss of appetite or weight loss.  He has a bowel movement most days but has to strain and denies rectal bleeding.  He tried some stool softeners but did not help much.  He has felt well for about a month now with no recurrence of the pain.  He does tend to be somewhat bloated and gassy, and feels as if he can sense to the trapped air moving through his bowels.  ROS:  Review of Systems  Constitutional: Negative for appetite change and unexpected weight change.  HENT: Negative for mouth sores and voice change.   Eyes: Negative for pain and redness.  Respiratory: Negative for cough and shortness of breath.   Cardiovascular: Negative for chest pain and  palpitations.  Genitourinary: Negative for dysuria and hematuria.  Musculoskeletal: Positive for arthralgias. Negative for myalgias.  Skin: Negative for pallor and rash.  Neurological: Negative for weakness and headaches.  Hematological: Negative for adenopathy.     Past Medical History: Past Medical History:  Diagnosis Date  . Allergic rhinitis    gets shots per Dr. Velora Heckler  . Allergy    sees Dr. Harold Hedge   . Asthma   . Glaucoma    sees Dr. Satira Sark   . Hypertension   . Malignant melanoma ([place])    right lower leg, diagnosed on1/21/11   From primary care telemedicine visit on 09/29/2020: "Here for one week of intermittent sharp pains in the RUQ just under the ribs. These start about 30-60 minutes after he eats a meal and they last about 1-2 hours. He also has pain when he takes a deep breath. No nausea, no loss of appetite, no fever. His BMs are normal. No urinary symptoms. No cough or SOB. He can still work out or take a 20 mile bike ride without pain.  "   Past Surgical History: Past Surgical History:  Procedure Laterality Date  . CATARACT EXTRACTION     right ey, Dr. Satira Sark   . COLONOSCOPY  12/15/2015   per Dr.Kaplan, clear, no repeats   . ESOPHAGOGASTRODUODENOSCOPY     WITH ESOPHAGEAL DILATION 2006 PER dR. kAPLAN  . REMOVAL  of melanoma  12/08/09   from right calf; per Dr. Monica Becton  . removal of melanoma from left abdomen  01/21/10    per Dr. Lysle Morales  . SQUAMOUS CELL CARCINOMA EXCISION  2013   per Dr. Lowella Fairy, from top of scalp      Family History: Family History  Problem Relation Age of Onset  . Hypertension Other   . Colon polyps Mother   . Hypertension Mother   . Diabetes Father   . Liver disease Maternal Uncle   . Colon cancer Neg Hx   . Stomach cancer Neg Hx   . Esophageal cancer Neg Hx   . Pancreatic cancer Neg Hx     Social History: Social History   Socioeconomic History  . Marital status: Married    Spouse name: Not on file  . Number of  children: Not on file  . Years of education: Not on file  . Highest education level: Not on file  Occupational History  . Not on file  Tobacco Use  . Smoking status: Former Games developer  . Smokeless tobacco: Never Used  . Tobacco comment: quit 30 years ago  Vaping Use  . Vaping Use: Never used  Substance and Sexual Activity  . Alcohol use: Yes    Alcohol/week: 5.0 - 10.0 standard drinks    Types: 5 - 10 Standard drinks or equivalent per week  . Drug use: No  . Sexual activity: Not Currently  Other Topics Concern  . Not on file  Social History Narrative  . Not on file   Social Determinants of Health   Financial Resource Strain: Not on file  Food Insecurity: Not on file  Transportation Needs: Not on file  Physical Activity: Not on file  Stress: Not on file  Social Connections: Not on file    Allergies: Allergies  Allergen Reactions  . Banana Rash    Outpatient Meds: Current Outpatient Medications  Medication Sig Dispense Refill  . albuterol (PROVENTIL HFA;VENTOLIN HFA) 108 (90 Base) MCG/ACT inhaler Inhale 2 puffs into the lungs every 4 (four) hours as needed for wheezing or shortness of breath. 8.5 Inhaler 0  . amLODipine (NORVASC) 5 MG tablet Take 1 tablet (5 mg total) by mouth daily. 90 tablet 3  . aspirin 81 MG EC tablet Take 81 mg by mouth daily.    . bimatoprost (LUMIGAN) 0.03 % ophthalmic solution Place 1 drop into both eyes at bedtime.    . brinzolamide (AZOPT) 1 % ophthalmic suspension Place 1 drop into both eyes 2 (two) times daily.    . Coenzyme Q10 200 MG capsule Take 200 mg by mouth daily.    Marland Kitchen EPINEPHrine 0.3 mg/0.3 mL IJ SOAJ injection Inject 0.3 mg into the muscle See admin instructions.    . Fluticasone Furoate 100 MCG/ACT AEPB Inhale 1 puff into the lungs daily. 30 each   . Multiple Vitamin (MULTIVITAMIN) tablet Take 1 tablet by mouth daily. One A Day    . telmisartan-hydrochlorothiazide (MICARDIS HCT) 80-25 MG tablet TAKE 1 TABLET BY MOUTH EVERY DAY 90  tablet 3  . UNABLE TO FIND Allergy shots at [place] [facility] every month     No current facility-administered medications for this visit.      ___________________________________________________________________ Objective   Exam:  BP 124/70   Pulse 62   Ht 5\' 8"  (1.727 m)   Wt 167 lb 6.4 oz (75.9 kg)   SpO2 99%   BMI 25.45 kg/m    General: Well-appearing  Eyes: sclera anicteric, no redness  ENT: oral mucosa moist without lesions, no cervical or supraclavicular lymphadenopathy  CV: RRR without  murmur, S1/S2, no JVD, no peripheral edema  Resp: clear to auscultation bilaterally, normal RR and effort noted  GI: soft, no tenderness, with active bowel sounds. No guarding or palpable organomegaly noted.  Skin; warm and dry, no rash or jaundice noted  Neuro: awake, alert and oriented x 3. Normal gross motor function and fluent speech  Labs:  CMP Latest Ref Rng & Units 09/10/2020 09/09/2019 09/04/2018  Glucose 65 - 99 mg/dL 107(H) 111(H) 106(H)  BUN 7 - 25 mg/dL 11 13 10   Creatinine 0.70 - 1.18 mg/dL 0.82 0.76 0.78  Sodium 135 - 146 mmol/L 136 138 138  Potassium 3.5 - 5.3 mmol/L 4.5 4.1 4.0  Chloride 98 - 110 mmol/L 98 99 98  CO2 20 - 32 mmol/L 29 31 29   Calcium 8.6 - 10.3 mg/dL 10.1 9.7 9.9  Total Protein 6.1 - 8.1 g/dL 6.6 6.6 6.7  Total Bilirubin 0.2 - 1.2 mg/dL 0.8 0.9 0.9  Alkaline Phos 39 - 117 U/L - 73 61  AST 10 - 35 U/L 22 21 20   ALT 9 - 46 U/L 16 16 12    CBC Latest Ref Rng & Units 09/10/2020 09/09/2019 09/04/2018  WBC 3.8 - 10.8 Thousand/uL 5.1 5.9 6.6  Hemoglobin 13.2 - 17.1 g/dL 15.9 15.1 15.4  Hematocrit 38.5 - 50.0 % 46.6 43.6 45.0  Platelets 140 - 400 Thousand/uL 211 199.0 198.0     Radiologic Studies:  CLINICAL DATA:  Initial evaluation for right upper quadrant pain.   EXAM: ABDOMEN ULTRASOUND COMPLETE   COMPARISON:  None.   FINDINGS: Gallbladder: No gallstones or wall thickening visualized. No sonographic Murphy sign noted  by sonographer.   Common bile duct: Diameter: 6 mm   Liver: No focal lesion identified. Within normal limits in parenchymal echogenicity. Portal vein is patent on color Doppler imaging with normal direction of blood flow towards the liver.   IVC: No abnormality visualized.   Pancreas: Visualized portion unremarkable.   Spleen: Size and appearance within normal limits.   Right Kidney: Length: 11.2 cm. Echogenicity within normal limits. No mass or hydronephrosis visualized.   Left Kidney: Length: 11.9 cm. Echogenicity within normal limits. No mass or hydronephrosis visualized.   Abdominal aorta: No aneurysm visualized.   Other findings: None.   IMPRESSION: Normal abdominal ultrasound. No findings to explain patient's symptoms identified.     Electronically Signed   By: Jeannine Boga M.D.   On: 10/09/2020 04:43   Assessment: Encounter Diagnoses  Name Primary?  . RUQ pain Yes  . Abdominal bloating   . Chronic constipation   . Diverticulosis of colon     Self-limited sharp right upper quadrant pain, no gallstones on imaging, LFTs shortly before that were normal. Cause unclear, but does not seem to be a chronic problem.  May be related to trapped gas, as he reports chronic bloating and gassiness. Some of his mild constipation may be related to diverticulosis. Plan:  Since stool softener did not help much, I recommend he try a magnesium capsule once a day.  If that does not help or make stool too loose, try a quarter to half capful of MiraLAX daily instead. No additional testing planned at this point, and I would be glad to see him again as needed.  Thank you for the courtesy of this consult.  Please call me with any questions or concerns.  Nelida Meuse III  CC: Referring provider noted above

## 2020-11-03 DIAGNOSIS — J301 Allergic rhinitis due to pollen: Secondary | ICD-10-CM | POA: Diagnosis not present

## 2020-11-03 DIAGNOSIS — J3089 Other allergic rhinitis: Secondary | ICD-10-CM | POA: Diagnosis not present

## 2020-11-03 DIAGNOSIS — J3081 Allergic rhinitis due to animal (cat) (dog) hair and dander: Secondary | ICD-10-CM | POA: Diagnosis not present

## 2020-11-10 DIAGNOSIS — J301 Allergic rhinitis due to pollen: Secondary | ICD-10-CM | POA: Diagnosis not present

## 2020-11-10 DIAGNOSIS — J3089 Other allergic rhinitis: Secondary | ICD-10-CM | POA: Diagnosis not present

## 2020-11-10 DIAGNOSIS — J3081 Allergic rhinitis due to animal (cat) (dog) hair and dander: Secondary | ICD-10-CM | POA: Diagnosis not present

## 2020-11-13 DIAGNOSIS — J301 Allergic rhinitis due to pollen: Secondary | ICD-10-CM | POA: Diagnosis not present

## 2020-11-13 DIAGNOSIS — J3081 Allergic rhinitis due to animal (cat) (dog) hair and dander: Secondary | ICD-10-CM | POA: Diagnosis not present

## 2020-11-13 DIAGNOSIS — J3089 Other allergic rhinitis: Secondary | ICD-10-CM | POA: Diagnosis not present

## 2020-11-18 DIAGNOSIS — J3089 Other allergic rhinitis: Secondary | ICD-10-CM | POA: Diagnosis not present

## 2020-11-18 DIAGNOSIS — J3081 Allergic rhinitis due to animal (cat) (dog) hair and dander: Secondary | ICD-10-CM | POA: Diagnosis not present

## 2020-11-18 DIAGNOSIS — J301 Allergic rhinitis due to pollen: Secondary | ICD-10-CM | POA: Diagnosis not present

## 2020-11-25 DIAGNOSIS — J3081 Allergic rhinitis due to animal (cat) (dog) hair and dander: Secondary | ICD-10-CM | POA: Diagnosis not present

## 2020-11-25 DIAGNOSIS — J3089 Other allergic rhinitis: Secondary | ICD-10-CM | POA: Diagnosis not present

## 2020-11-25 DIAGNOSIS — J301 Allergic rhinitis due to pollen: Secondary | ICD-10-CM | POA: Diagnosis not present

## 2020-12-01 DIAGNOSIS — J3089 Other allergic rhinitis: Secondary | ICD-10-CM | POA: Diagnosis not present

## 2020-12-01 DIAGNOSIS — J301 Allergic rhinitis due to pollen: Secondary | ICD-10-CM | POA: Diagnosis not present

## 2020-12-01 DIAGNOSIS — J3081 Allergic rhinitis due to animal (cat) (dog) hair and dander: Secondary | ICD-10-CM | POA: Diagnosis not present

## 2020-12-07 DIAGNOSIS — H2512 Age-related nuclear cataract, left eye: Secondary | ICD-10-CM | POA: Diagnosis not present

## 2020-12-07 DIAGNOSIS — H43813 Vitreous degeneration, bilateral: Secondary | ICD-10-CM | POA: Diagnosis not present

## 2020-12-07 DIAGNOSIS — H401132 Primary open-angle glaucoma, bilateral, moderate stage: Secondary | ICD-10-CM | POA: Diagnosis not present

## 2020-12-07 DIAGNOSIS — H524 Presbyopia: Secondary | ICD-10-CM | POA: Diagnosis not present

## 2020-12-09 DIAGNOSIS — J301 Allergic rhinitis due to pollen: Secondary | ICD-10-CM | POA: Diagnosis not present

## 2020-12-09 DIAGNOSIS — J3081 Allergic rhinitis due to animal (cat) (dog) hair and dander: Secondary | ICD-10-CM | POA: Diagnosis not present

## 2020-12-09 DIAGNOSIS — J3089 Other allergic rhinitis: Secondary | ICD-10-CM | POA: Diagnosis not present

## 2020-12-11 DIAGNOSIS — J301 Allergic rhinitis due to pollen: Secondary | ICD-10-CM | POA: Diagnosis not present

## 2020-12-11 DIAGNOSIS — J3089 Other allergic rhinitis: Secondary | ICD-10-CM | POA: Diagnosis not present

## 2020-12-11 DIAGNOSIS — J3081 Allergic rhinitis due to animal (cat) (dog) hair and dander: Secondary | ICD-10-CM | POA: Diagnosis not present

## 2020-12-14 DIAGNOSIS — J3081 Allergic rhinitis due to animal (cat) (dog) hair and dander: Secondary | ICD-10-CM | POA: Diagnosis not present

## 2020-12-14 DIAGNOSIS — J3089 Other allergic rhinitis: Secondary | ICD-10-CM | POA: Diagnosis not present

## 2020-12-14 DIAGNOSIS — J301 Allergic rhinitis due to pollen: Secondary | ICD-10-CM | POA: Diagnosis not present

## 2020-12-18 DIAGNOSIS — J3089 Other allergic rhinitis: Secondary | ICD-10-CM | POA: Diagnosis not present

## 2020-12-18 DIAGNOSIS — J301 Allergic rhinitis due to pollen: Secondary | ICD-10-CM | POA: Diagnosis not present

## 2020-12-18 DIAGNOSIS — J3081 Allergic rhinitis due to animal (cat) (dog) hair and dander: Secondary | ICD-10-CM | POA: Diagnosis not present

## 2020-12-22 DIAGNOSIS — J3089 Other allergic rhinitis: Secondary | ICD-10-CM | POA: Diagnosis not present

## 2020-12-22 DIAGNOSIS — J301 Allergic rhinitis due to pollen: Secondary | ICD-10-CM | POA: Diagnosis not present

## 2020-12-22 DIAGNOSIS — J3081 Allergic rhinitis due to animal (cat) (dog) hair and dander: Secondary | ICD-10-CM | POA: Diagnosis not present

## 2020-12-24 DIAGNOSIS — Z85828 Personal history of other malignant neoplasm of skin: Secondary | ICD-10-CM | POA: Diagnosis not present

## 2020-12-24 DIAGNOSIS — Z85820 Personal history of malignant melanoma of skin: Secondary | ICD-10-CM | POA: Diagnosis not present

## 2020-12-24 DIAGNOSIS — L820 Inflamed seborrheic keratosis: Secondary | ICD-10-CM | POA: Diagnosis not present

## 2020-12-24 DIAGNOSIS — L578 Other skin changes due to chronic exposure to nonionizing radiation: Secondary | ICD-10-CM | POA: Diagnosis not present

## 2020-12-24 DIAGNOSIS — L821 Other seborrheic keratosis: Secondary | ICD-10-CM | POA: Diagnosis not present

## 2020-12-24 DIAGNOSIS — L570 Actinic keratosis: Secondary | ICD-10-CM | POA: Diagnosis not present

## 2020-12-24 DIAGNOSIS — L814 Other melanin hyperpigmentation: Secondary | ICD-10-CM | POA: Diagnosis not present

## 2020-12-24 DIAGNOSIS — Z86018 Personal history of other benign neoplasm: Secondary | ICD-10-CM | POA: Diagnosis not present

## 2020-12-24 DIAGNOSIS — D225 Melanocytic nevi of trunk: Secondary | ICD-10-CM | POA: Diagnosis not present

## 2020-12-25 DIAGNOSIS — J3089 Other allergic rhinitis: Secondary | ICD-10-CM | POA: Diagnosis not present

## 2020-12-25 DIAGNOSIS — J301 Allergic rhinitis due to pollen: Secondary | ICD-10-CM | POA: Diagnosis not present

## 2020-12-25 DIAGNOSIS — J3081 Allergic rhinitis due to animal (cat) (dog) hair and dander: Secondary | ICD-10-CM | POA: Diagnosis not present

## 2020-12-29 DIAGNOSIS — J301 Allergic rhinitis due to pollen: Secondary | ICD-10-CM | POA: Diagnosis not present

## 2020-12-29 DIAGNOSIS — J3089 Other allergic rhinitis: Secondary | ICD-10-CM | POA: Diagnosis not present

## 2020-12-29 DIAGNOSIS — J3081 Allergic rhinitis due to animal (cat) (dog) hair and dander: Secondary | ICD-10-CM | POA: Diagnosis not present

## 2020-12-31 DIAGNOSIS — H25012 Cortical age-related cataract, left eye: Secondary | ICD-10-CM | POA: Diagnosis not present

## 2020-12-31 DIAGNOSIS — H25812 Combined forms of age-related cataract, left eye: Secondary | ICD-10-CM | POA: Diagnosis not present

## 2020-12-31 DIAGNOSIS — H2512 Age-related nuclear cataract, left eye: Secondary | ICD-10-CM | POA: Diagnosis not present

## 2021-01-05 DIAGNOSIS — H26491 Other secondary cataract, right eye: Secondary | ICD-10-CM | POA: Diagnosis not present

## 2021-01-06 DIAGNOSIS — J301 Allergic rhinitis due to pollen: Secondary | ICD-10-CM | POA: Diagnosis not present

## 2021-01-06 DIAGNOSIS — J3081 Allergic rhinitis due to animal (cat) (dog) hair and dander: Secondary | ICD-10-CM | POA: Diagnosis not present

## 2021-01-06 DIAGNOSIS — J3089 Other allergic rhinitis: Secondary | ICD-10-CM | POA: Diagnosis not present

## 2021-01-11 DIAGNOSIS — J3081 Allergic rhinitis due to animal (cat) (dog) hair and dander: Secondary | ICD-10-CM | POA: Diagnosis not present

## 2021-01-11 DIAGNOSIS — J3089 Other allergic rhinitis: Secondary | ICD-10-CM | POA: Diagnosis not present

## 2021-01-11 DIAGNOSIS — J301 Allergic rhinitis due to pollen: Secondary | ICD-10-CM | POA: Diagnosis not present

## 2021-01-15 NOTE — Progress Notes (Signed)
Subjective:   Shannon Blume. is a 75 y.o. male who presents for an Initial Medicare Annual Wellness Visit.  Review of Systems    N/A  Cardiac Risk Factors include: advanced age (>75men, >23 women);male gender     Objective:    Today's Vitals   01/18/21 1000  BP: 122/64  Pulse: 61  Temp: 97.9 F (36.6 C)  TempSrc: Oral  SpO2: 98%  Weight: 169 lb 1 oz (76.7 kg)  Height: 5\' 8"  (1.727 m)   Body mass index is 25.71 kg/m.  Advanced Directives 01/18/2021 12/21/2015  Does Patient Have a Medical Advance Directive? Yes No  Type of Paramedic of [place];Living will -  Does patient want to make changes to medical advance directive? No - Patient declined -  Copy of [place] in Chart? No - copy requested -    Current Medications (verified) Outpatient Encounter Medications as of 01/18/2021  Medication Sig  . albuterol (PROVENTIL HFA;VENTOLIN HFA) 108 (90 Base) MCG/ACT inhaler Inhale 2 puffs into the lungs every 4 (four) hours as needed for wheezing or shortness of breath.  Marland Kitchen amLODipine (NORVASC) 5 MG tablet Take 1 tablet (5 mg total) by mouth daily.  Marland Kitchen aspirin 81 MG EC tablet Take 81 mg by mouth daily.  . bimatoprost (LUMIGAN) 0.03 % ophthalmic solution Place 1 drop into both eyes at bedtime.  . brinzolamide (AZOPT) 1 % ophthalmic suspension Place 1 drop into both eyes 2 (two) times daily.  . Coenzyme Q10 200 MG capsule Take 200 mg by mouth daily.  . Fluticasone Furoate 100 MCG/ACT AEPB Inhale 1 puff into the lungs daily.  . Multiple Vitamin (MULTIVITAMIN) tablet Take 1 tablet by mouth daily. One A Day  . telmisartan-hydrochlorothiazide (MICARDIS HCT) 80-25 MG tablet TAKE 1 TABLET BY MOUTH EVERY DAY  . UNABLE TO FIND Allergy shots at [place] [facility] every month  . EPINEPHrine 0.3 mg/0.3 mL IJ SOAJ injection Inject 0.3 mg into the muscle See admin instructions. (Patient not taking: Reported on 01/18/2021)   No  facility-administered encounter medications on file as of 01/18/2021.    Allergies (verified) Banana   History: Past Medical History:  Diagnosis Date  . Allergic rhinitis    gets shots per Dr. Velora Heckler  . Allergy    sees Dr. Harold Hedge   . Asthma   . Glaucoma    sees Dr. Satira Sark   . Hypertension   . Malignant melanoma ([place])    right lower leg, diagnosed on1/21/11   Past Surgical History:  Procedure Laterality Date  . CATARACT EXTRACTION     right ey, Dr. Satira Sark   . COLONOSCOPY  12/15/2015   per Dr.Kaplan, clear, no repeats   . ESOPHAGOGASTRODUODENOSCOPY     WITH ESOPHAGEAL DILATION 2006 PER dR. kAPLAN  . REMOVAL  of melanoma  12/08/09   from right calf; per Dr. Monica Becton  . removal of melanoma from left abdomen  01/21/10    per Dr. Meredith Leeds  . SQUAMOUS CELL CARCINOMA EXCISION  2013   per Dr. Terrence Dupont, from top of scalp    Family History  Problem Relation Age of Onset  . Hypertension Other   . Colon polyps Mother   . Hypertension Mother   . Diabetes Father   . Liver disease Maternal Uncle   . Colon cancer Neg Hx   . Stomach cancer Neg Hx   . Esophageal cancer Neg Hx   . Pancreatic cancer Neg Hx  Social History   Socioeconomic History  . Marital status: Married    Spouse name: Not on file  . Number of children: Not on file  . Years of education: Not on file  . Highest education level: Not on file  Occupational History  . Not on file  Tobacco Use  . Smoking status: Former Research scientist (life sciences)  . Smokeless tobacco: Never Used  . Tobacco comment: quit 30 years ago  Vaping Use  . Vaping Use: Never used  Substance and Sexual Activity  . Alcohol use: Yes    Alcohol/week: 5.0 - 10.0 standard drinks    Types: 5 - 10 Standard drinks or equivalent per week  . Drug use: No  . Sexual activity: Not Currently  Other Topics Concern  . Not on file  Social History Narrative  . Not on file   Social Determinants of Health   Financial Resource Strain: Low Risk   . Difficulty  of Paying Living Expenses: Not hard at all  Food Insecurity: No Food Insecurity  . Worried About Charity fundraiser in the Last Year: Never true  . Ran Out of Food in the Last Year: Never true  Transportation Needs: No Transportation Needs  . Lack of Transportation (Medical): No  . Lack of Transportation (Non-Medical): No  Physical Activity: Sufficiently Active  . Days of Exercise per Week: 4 days  . Minutes of Exercise per Session: 40 min  Stress: No Stress Concern Present  . Feeling of Stress : Not at all  Social Connections: Moderately Integrated  . Frequency of Communication with Friends and Family: Three times a week  . Frequency of Social Gatherings with Friends and Family: More than three times a week  . Attends Religious Services: Never  . Active Member of Clubs or Organizations: Yes  . Attends Archivist Meetings: Never  . Marital Status: Married    Tobacco Counseling Counseling given: Not Answered Comment: quit 30 years ago   Clinical Intake:  Pre-visit preparation completed: Yes  Pain : No/denies pain     Nutritional Risks: None Diabetes: No  How often do you need to have someone help you when you read instructions, pamphlets, or other written materials from your doctor or pharmacy?: 1 - Never What is the last grade level you completed in school?: Masters Degree  Diabetic?No   Interpreter Needed?: No  Information entered by :: [place] of Daily Living In your present state of health, do you have any difficulty performing the following activities: 01/18/2021  Hearing? N  Vision? N  Difficulty concentrating or making decisions? N  Walking or climbing stairs? N  Dressing or bathing? N  Doing errands, shopping? N  Preparing Food and eating ? N  Using the Toilet? N  In the past six months, have you accidently leaked urine? Y  Comment some urine leakage with urgency  Do you have problems with loss of bowel control? N  Managing  your Medications? N  Managing your Finances? N  Housekeeping or managing your Housekeeping? N  Some recent data might be hidden    Patient Care Team: Laurey Morale, MD as PCP - General [place], Jennefer Bravo, MD as Consulting Physician (Dermatology) Marygrace Drought, MD as Consulting Physician (Ophthalmology) Harold Hedge, Darrick Grinder, MD as Consulting Physician (Allergy and Immunology) Elveria Rising, DDS (Dentistry)  Indicate any recent Medical Services you may have received from other than [facility] providers in the past year (date may be approximate).  Assessment:   This is a routine wellness examination for Shannon Horton.  Hearing/Vision screen  Hearing Screening   125Hz  250Hz  500Hz  1000Hz  2000Hz  3000Hz  4000Hz  6000Hz  8000Hz   Right ear:           Left ear:           Vision Screening Comments: Gets eyes examined twice per year. Has glaucoma and hx of cataract extraction. Wears glasses   Dietary issues and exercise activities discussed: Current Exercise Habits: Home exercise routine, Type of exercise: walking, Time (Minutes): 45, Frequency (Times/Week): 4, Weekly Exercise (Minutes/Week): 180, Intensity: Mild, Exercise limited by: Other - see comments (recent eye surgery)  Goals    . DIET - INCREASE WATER INTAKE    . Patient Stated     I would like to get back to riding my bike 20 miles per day!       Depression Screen PHQ 2/9 Scores 01/18/2021 09/10/2020 09/10/2020 09/10/2020 09/04/2018 09/19/2016 09/16/2015  PHQ - 2 Score 0 2 0 1 0 0 0  PHQ- 9 Score - 4 1 - - - -    Fall Risk Fall Risk  01/18/2021 09/10/2020 09/04/2018 09/19/2016 09/16/2015  Falls in the past year? 0 0 0 No No  Number falls in past yr: 0 - - - -  Injury with Fall? 0 - - - -  Risk for fall due to : No Fall Risks - - - -  Follow up Falls evaluation completed;Falls prevention discussed - - - -    FALL RISK PREVENTION PERTAINING TO THE HOME:  Any stairs in or around the home? Yes  If so, are there any without  handrails? No  Home free of loose throw rugs in walkways, pet beds, electrical cords, etc? Yes  Adequate lighting in your home to reduce risk of falls? Yes   ASSISTIVE DEVICES UTILIZED TO PREVENT FALLS:  Life alert? No  Use of a cane, walker or w/c? No  Grab bars in the bathroom? No  Shower chair or bench in shower? No  Elevated toilet seat or a handicapped toilet? No   TIMED UP AND GO:  Was the test performed? Yes .  Length of time to ambulate 10 feet: 4 sec.   Gait steady and fast without use of assistive device.  Cognitive Function:   Normal cognitive status assessed by direct observation by this Nurse Health Advisor. No abnormalities found.        Immunizations Immunization History  Administered Date(s) Administered  . Fluad Quad(high Dose 65+) 08/01/2019, 07/27/2020  . Influenza Whole 08/29/2017  . Influenza, High Dose Seasonal PF 08/26/2015, 08/29/2017, 09/04/2018, 09/18/2018, 09/10/2019  . PFIZER(Purple Top)SARS-COV-2 Vaccination 12/05/2019, 12/30/2019, 03/24/2020, 08/03/2020  . Pneumococcal Conjugate-13 11/01/2013  . Pneumococcal Polysaccharide-23 09/18/2008, 10/30/2015, 01/10/2017, 08/09/2017, 03/06/2018, 09/18/2018, 09/10/2019  . Td 09/20/2010  . Tdap 09/10/2020  . Zoster 10/04/2013    TDAP status: Up to date  Flu Vaccine status: Up to date  Pneumococcal vaccine status: Up to date  Covid-19 vaccine status: Completed vaccines  Qualifies for Shingles Vaccine? Yes   Zostavax completed Yes   Shingrix Completed?: No.    Education has been provided regarding the importance of this vaccine. Patient has been advised to call insurance company to determine out of pocket expense if they have not yet received this vaccine. Advised may also receive vaccine at local pharmacy or Health Dept. Verbalized acceptance and understanding.  Screening Tests Health Maintenance  Topic Date Due  . TETANUS/TDAP  09/10/2030  . COVID-19 Vaccine  Completed  . Hepatitis C Screening   Completed  . HPV VACCINES  Aged Out    Health Maintenance  There are no preventive care reminders to display for this patient.  Colorectal cancer screening: No longer required.   Lung Cancer Screening: (Low Dose CT Chest recommended if Age 3-80 years, 30 pack-year currently smoking OR have quit w/in 15years.) does not qualify.   Lung Cancer Screening Referral: N/A   Additional Screening:  Hepatitis C Screening: does qualify; Completed 09/12/2016  Vision Screening: Recommended annual ophthalmology exams for early detection of glaucoma and other disorders of the eye. Is the patient up to date with their annual eye exam?  Yes  Who is the provider or what is the name of the office in which the patient attends annual eye exams? Dr. Marygrace Drought  If pt is not established with a provider, would they like to be referred to a provider to establish care? No .   Dental Screening: Recommended annual dental exams for proper oral hygiene  Community Resource Referral / Chronic Care Management: CRR required this visit?  No   CCM required this visit?  No      Plan:     I have personally reviewed and noted the following in the patient's chart:   . Medical and social history . Use of alcohol, tobacco or illicit drugs  . Current medications and supplements . Functional ability and status . Nutritional status . Physical activity . Advanced directives . List of other physicians . Hospitalizations, surgeries, and ER visits in previous 12 months . Vitals . Screenings to include cognitive, depression, and falls . Referrals and appointments  In addition, I have reviewed and discussed with patient certain preventive protocols, quality metrics, and best practice recommendations. A written personalized care plan for preventive services as well as general preventive health recommendations were provided to patient.     Ofilia Neas, LPN   03/02/5464   Nurse Notes: None

## 2021-01-18 ENCOUNTER — Other Ambulatory Visit: Payer: Self-pay

## 2021-01-18 ENCOUNTER — Ambulatory Visit (INDEPENDENT_AMBULATORY_CARE_PROVIDER_SITE_OTHER): Payer: Medicare PPO

## 2021-01-18 VITALS — BP 122/64 | HR 61 | Temp 97.9°F | Ht 68.0 in | Wt 169.1 lb

## 2021-01-18 DIAGNOSIS — Z0000 Encounter for general adult medical examination without abnormal findings: Secondary | ICD-10-CM

## 2021-01-18 NOTE — Patient Instructions (Signed)
Mr. Shannon Horton , Thank you for taking time to come for your Medicare Wellness Visit. I appreciate your ongoing commitment to your health goals. Please review the following plan we discussed and let me know if I can assist you in the future.   Screening recommendations/referrals: Colonoscopy: No longer required  Recommended yearly ophthalmology/optometry visit for glaucoma screening and checkup Recommended yearly dental visit for hygiene and checkup  Vaccinations: Influenza vaccine: Up to date, next due fall 2022  Pneumococcal vaccine: Completed series Tdap vaccine: Up to date, next due 09/10/2030 Shingles vaccine: Currently due for Shingrix, this is optional. If you would like to receive we recommend that you do so at your local pharmacy.    Advanced directives: Please bring in copies of your advanced medical directives so that we may scan them into your chart.  Conditions/risks identified: None   Next appointment: None  Preventive Care 65 Years and Older, Male Preventive care refers to lifestyle choices and visits with your health care provider that can promote health and wellness. What does preventive care include?  A yearly physical exam. This is also called an annual well check.  Dental exams once or twice a year.  Routine eye exams. Ask your health care provider how often you should have your eyes checked.  Personal lifestyle choices, including:  Daily care of your teeth and gums.  Regular physical activity.  Eating a healthy diet.  Avoiding tobacco and drug use.  Limiting alcohol use.  Practicing safe sex.  Taking low doses of aspirin every day.  Taking vitamin and mineral supplements as recommended by your health care provider. What happens during an annual well check? The services and screenings done by your health care provider during your annual well check will depend on your age, overall health, lifestyle risk factors, and family history of disease. Counseling   Your health care provider may ask you questions about your:  Alcohol use.  Tobacco use.  Drug use.  Emotional well-being.  Home and relationship well-being.  Sexual activity.  Eating habits.  History of falls.  Memory and ability to understand (cognition).  Work and work Statistician. Screening  You may have the following tests or measurements:  Height, weight, and BMI.  Blood pressure.  Lipid and cholesterol levels. These may be checked every 5 years, or more frequently if you are over 18 years old.  Skin check.  Lung cancer screening. You may have this screening every year starting at age 75 if you have a 30-pack-year history of smoking and currently smoke or have quit within the past 15 years.  Fecal occult blood test (FOBT) of the stool. You may have this test every year starting at age 67.  Flexible sigmoidoscopy or colonoscopy. You may have a sigmoidoscopy every 5 years or a colonoscopy every 10 years starting at age 68.  Prostate cancer screening. Recommendations will vary depending on your family history and other risks.  Hepatitis C blood test.  Hepatitis B blood test.  Sexually transmitted disease (STD) testing.  Diabetes screening. This is done by checking your blood sugar (glucose) after you have not eaten for a while (fasting). You may have this done every 1-3 years.  Abdominal aortic aneurysm (AAA) screening. You may need this if you are a current or former smoker.  Osteoporosis. You may be screened starting at age 68 if you are at high risk. Talk with your health care provider about your test results, treatment options, and if necessary, the need for more tests.  Vaccines  Your health care provider may recommend certain vaccines, such as:  Influenza vaccine. This is recommended every year.  Tetanus, diphtheria, and acellular pertussis (Tdap, Td) vaccine. You may need a Td booster every 10 years.  Zoster vaccine. You may need this after age  35.  Pneumococcal 13-valent conjugate (PCV13) vaccine. One dose is recommended after age 45.  Pneumococcal polysaccharide (PPSV23) vaccine. One dose is recommended after age 34. Talk to your health care provider about which screenings and vaccines you need and how often you need them. This information is not intended to replace advice given to you by your health care provider. Make sure you discuss any questions you have with your health care provider. Document Released: 11/13/2015 Document Revised: 07/06/2016 Document Reviewed: 08/18/2015 Elsevier Interactive Patient Education  2017 [place] Prevention in the Home Falls can cause injuries. They can happen to people of all ages. There are many things you can do to make your home safe and to help prevent falls. What can I do on the outside of my home?  Regularly fix the edges of walkways and driveways and fix any cracks.  Remove anything that might make you trip as you walk through a door, such as a raised step or threshold.  Trim any bushes or trees on the path to your home.  Use bright outdoor lighting.  Clear any walking paths of anything that might make someone trip, such as rocks or tools.  Regularly check to see if handrails are loose or broken. Make sure that both sides of any steps have handrails.  Any raised decks and porches should have guardrails on the edges.  Have any leaves, snow, or ice cleared regularly.  Use sand or salt on walking paths during winter.  Clean up any spills in your garage right away. This includes oil or grease spills. What can I do in the bathroom?  Use night lights.  Install grab bars by the toilet and in the tub and shower. Do not use towel bars as grab bars.  Use non-skid mats or decals in the tub or shower.  If you need to sit down in the shower, use a plastic, non-slip stool.  Keep the floor dry. Clean up any water that spills on the floor as soon as it happens.  Remove  soap buildup in the tub or shower regularly.  Attach bath mats securely with double-sided non-slip rug tape.  Do not have throw rugs and other things on the floor that can make you trip. What can I do in the bedroom?  Use night lights.  Make sure that you have a light by your bed that is easy to reach.  Do not use any sheets or blankets that are too big for your bed. They should not hang down onto the floor.  Have a firm chair that has side arms. You can use this for support while you get dressed.  Do not have throw rugs and other things on the floor that can make you trip. What can I do in the kitchen?  Clean up any spills right away.  Avoid walking on wet floors.  Keep items that you use a lot in easy-to-reach places.  If you need to reach something above you, use a strong step stool that has a grab bar.  Keep electrical cords out of the way.  Do not use floor polish or wax that makes floors slippery. If you must use wax, use non-skid floor wax.  Do not have throw rugs and other things on the floor that can make you trip. What can I do with my stairs?  Do not leave any items on the stairs.  Make sure that there are handrails on both sides of the stairs and use them. Fix handrails that are broken or loose. Make sure that handrails are as long as the stairways.  Check any carpeting to make sure that it is firmly attached to the stairs. Fix any carpet that is loose or worn.  Avoid having throw rugs at the top or bottom of the stairs. If you do have throw rugs, attach them to the floor with carpet tape.  Make sure that you have a light switch at the top of the stairs and the bottom of the stairs. If you do not have them, ask someone to add them for you. What else can I do to help prevent falls?  Wear shoes that:  Do not have high heels.  Have rubber bottoms.  Are comfortable and fit you well.  Are closed at the toe. Do not wear sandals.  If you use a  stepladder:  Make sure that it is fully opened. Do not climb a closed stepladder.  Make sure that both sides of the stepladder are locked into place.  Ask someone to hold it for you, if possible.  Clearly mark and make sure that you can see:  Any grab bars or handrails.  First and last steps.  Where the edge of each step is.  Use tools that help you move around (mobility aids) if they are needed. These include:  Canes.  Walkers.  Scooters.  Crutches.  Turn on the lights when you go into a dark area. Replace any light bulbs as soon as they burn out.  Set up your furniture so you have a clear path. Avoid moving your furniture around.  If any of your floors are uneven, fix them.  If there are any pets around you, be aware of where they are.  Review your medicines with your doctor. Some medicines can make you feel dizzy. This can increase your chance of falling. Ask your doctor what other things that you can do to help prevent falls. This information is not intended to replace advice given to you by your health care provider. Make sure you discuss any questions you have with your health care provider. Document Released: 08/13/2009 Document Revised: 03/24/2016 Document Reviewed: 11/21/2014 Elsevier Interactive Patient Education  2017 [employer].

## 2021-01-19 DIAGNOSIS — J3089 Other allergic rhinitis: Secondary | ICD-10-CM | POA: Diagnosis not present

## 2021-01-19 DIAGNOSIS — J3081 Allergic rhinitis due to animal (cat) (dog) hair and dander: Secondary | ICD-10-CM | POA: Diagnosis not present

## 2021-01-19 DIAGNOSIS — J301 Allergic rhinitis due to pollen: Secondary | ICD-10-CM | POA: Diagnosis not present

## 2021-01-27 DIAGNOSIS — J301 Allergic rhinitis due to pollen: Secondary | ICD-10-CM | POA: Diagnosis not present

## 2021-01-27 DIAGNOSIS — J3081 Allergic rhinitis due to animal (cat) (dog) hair and dander: Secondary | ICD-10-CM | POA: Diagnosis not present

## 2021-01-27 DIAGNOSIS — J3089 Other allergic rhinitis: Secondary | ICD-10-CM | POA: Diagnosis not present

## 2021-02-01 DIAGNOSIS — Z20822 Contact with and (suspected) exposure to covid-19: Secondary | ICD-10-CM | POA: Diagnosis not present

## 2021-02-02 DIAGNOSIS — Z961 Presence of intraocular lens: Secondary | ICD-10-CM | POA: Diagnosis not present

## 2021-02-03 DIAGNOSIS — J3089 Other allergic rhinitis: Secondary | ICD-10-CM | POA: Diagnosis not present

## 2021-02-03 DIAGNOSIS — J301 Allergic rhinitis due to pollen: Secondary | ICD-10-CM | POA: Diagnosis not present

## 2021-02-03 DIAGNOSIS — J3081 Allergic rhinitis due to animal (cat) (dog) hair and dander: Secondary | ICD-10-CM | POA: Diagnosis not present

## 2021-02-10 DIAGNOSIS — J3081 Allergic rhinitis due to animal (cat) (dog) hair and dander: Secondary | ICD-10-CM | POA: Diagnosis not present

## 2021-02-10 DIAGNOSIS — J3089 Other allergic rhinitis: Secondary | ICD-10-CM | POA: Diagnosis not present

## 2021-02-10 DIAGNOSIS — J4530 Mild persistent asthma, uncomplicated: Secondary | ICD-10-CM | POA: Diagnosis not present

## 2021-02-10 DIAGNOSIS — J301 Allergic rhinitis due to pollen: Secondary | ICD-10-CM | POA: Diagnosis not present

## 2021-02-15 DIAGNOSIS — J3089 Other allergic rhinitis: Secondary | ICD-10-CM | POA: Diagnosis not present

## 2021-02-15 DIAGNOSIS — J3081 Allergic rhinitis due to animal (cat) (dog) hair and dander: Secondary | ICD-10-CM | POA: Diagnosis not present

## 2021-02-15 DIAGNOSIS — J301 Allergic rhinitis due to pollen: Secondary | ICD-10-CM | POA: Diagnosis not present

## 2021-02-17 ENCOUNTER — Other Ambulatory Visit: Payer: Self-pay

## 2021-02-17 ENCOUNTER — Ambulatory Visit: Payer: Medicare PPO | Admitting: Family Medicine

## 2021-02-17 ENCOUNTER — Encounter: Payer: Self-pay | Admitting: Family Medicine

## 2021-02-17 VITALS — BP 116/68 | HR 53 | Temp 97.4°F | Wt 166.0 lb

## 2021-02-17 DIAGNOSIS — R531 Weakness: Secondary | ICD-10-CM | POA: Diagnosis not present

## 2021-02-17 LAB — CBC WITH DIFFERENTIAL/PLATELET
Basophils Absolute: 0 10*3/uL (ref 0.0–0.1)
Basophils Relative: 0.6 % (ref 0.0–3.0)
Eosinophils Absolute: 0.3 10*3/uL (ref 0.0–0.7)
Eosinophils Relative: 4.1 % (ref 0.0–5.0)
HCT: 43.8 % (ref 39.0–52.0)
Hemoglobin: 15.1 g/dL (ref 13.0–17.0)
Lymphocytes Relative: 15.5 % (ref 12.0–46.0)
Lymphs Abs: 1 10*3/uL (ref 0.7–4.0)
MCHC: 34.5 g/dL (ref 30.0–36.0)
MCV: 91.6 fl (ref 78.0–100.0)
Monocytes Absolute: 0.5 10*3/uL (ref 0.1–1.0)
Monocytes Relative: 8 % (ref 3.0–12.0)
Neutro Abs: 4.8 10*3/uL (ref 1.4–7.7)
Neutrophils Relative %: 71.8 % (ref 43.0–77.0)
Platelets: 208 10*3/uL (ref 150.0–400.0)
RBC: 4.78 Mil/uL (ref 4.22–5.81)
RDW: 13.2 % (ref 11.5–15.5)
WBC: 6.6 10*3/uL (ref 4.0–10.5)

## 2021-02-17 LAB — HEPATIC FUNCTION PANEL
ALT: 13 U/L (ref 0–53)
AST: 17 U/L (ref 0–37)
Albumin: 4.2 g/dL (ref 3.5–5.2)
Alkaline Phosphatase: 77 U/L (ref 39–117)
Bilirubin, Direct: 0.2 mg/dL (ref 0.0–0.3)
Total Bilirubin: 0.8 mg/dL (ref 0.2–1.2)
Total Protein: 6.5 g/dL (ref 6.0–8.3)

## 2021-02-17 LAB — TSH: TSH: 2.73 u[IU]/mL (ref 0.35–4.50)

## 2021-02-17 LAB — HEMOGLOBIN A1C: Hgb A1c MFr Bld: 5.4 % (ref 4.6–6.5)

## 2021-02-17 LAB — C-REACTIVE PROTEIN: CRP: <1 mg/dL (ref 0.5–20.0)

## 2021-02-17 LAB — FOLATE: Folate: >23.6 ng/mL (ref >5.9)

## 2021-02-17 LAB — BASIC METABOLIC PANEL
BUN: 8 mg/dL (ref 6–23)
CO2: 30 mEq/L (ref 19–32)
Calcium: 9.8 mg/dL (ref 8.4–10.5)
Chloride: 95 mEq/L — ABNORMAL LOW (ref 96–112)
Creatinine, Ser: 0.78 mg/dL (ref 0.40–1.50)
GFR: 87.66 mL/min (ref >60.00)
Glucose, Bld: 104 mg/dL — ABNORMAL HIGH (ref 70–99)
Potassium: 3.8 mEq/L (ref 3.5–5.1)
Sodium: 135 mEq/L (ref 135–145)

## 2021-02-17 LAB — T3, FREE: T3, Free: 2.9 pg/mL (ref 2.3–4.2)

## 2021-02-17 LAB — CK: Total CK: 37 U/L (ref 7–232)

## 2021-02-17 LAB — SEDIMENTATION RATE: Sed Rate: -8 mm/hr — ABNORMAL LOW (ref 0–20)

## 2021-02-17 LAB — T4, FREE: Free T4: 0.77 ng/dL (ref 0.60–1.60)

## 2021-02-17 LAB — VITAMIN B12: Vitamin B-12: 231 pg/mL (ref 211–911)

## 2021-02-17 NOTE — Progress Notes (Signed)
   Subjective:    Patient ID: Shannon Horton., male    DOB: 1946-02-23, 75 y.o.   MRN: [id number]  HPI Here at the insistence of his son (who is a Marine scientist) and his wife for 4 months of mild generalized weakness. He has always been very active, and he would often ride his bike for 20 miles. However lately he has to stop after 6-7 miles because he gets so tired. No chest pain or SOB. He has noticed that his fingers shake a little, and he gives th example of having trouble typing on his computer. He says he is a little unsteady on his feet, and he has to lean against a wall when putting his pants on. This has never happened before. His son has noticed that he drags his feet when walking at times. His BP is stable. We did complete labs last November which were normal.    Review of Systems  Constitutional: Positive for fatigue.  Respiratory: Negative.   Cardiovascular: Negative.   Gastrointestinal: Negative.   Genitourinary: Negative.   Neurological: Positive for tremors and weakness. Negative for dizziness, seizures, syncope, facial asymmetry, speech difficulty, light-headedness, numbness and headaches.       Objective:   Physical Exam Constitutional:      General: He is not in acute distress.    Appearance: Normal appearance.     Comments: He walks slowly and he is bent forward at the waist (this is not typical for him)   Cardiovascular:     Rate and Rhythm: Normal rate and regular rhythm.     Pulses: Normal pulses.     Heart sounds: Normal heart sounds.  Pulmonary:     Effort: Pulmonary effort is normal.     Breath sounds: Normal breath sounds.  Neurological:     Mental Status: He is alert and oriented to person, place, and time.     Coordination: Coordination normal.     Gait: Gait normal.           Assessment & Plan:  Generalized weakness and unsteadiness. We will get labs today including ESR, CRP, and CK. I suspect he may have an early movement disorder, such as  Parkinson's. We  will refer to Neurology to evaluate further. We spent 42 minutes together today.  Alysia Penna, MD
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
## 2021-02-22 ENCOUNTER — Ambulatory Visit: Payer: Medicare PPO | Admitting: Neurology

## 2021-02-22 ENCOUNTER — Encounter: Payer: Self-pay | Admitting: Neurology

## 2021-02-22 ENCOUNTER — Other Ambulatory Visit: Payer: Self-pay

## 2021-02-22 VITALS — BP 128/77 | HR 62 | Resp 20 | Ht 68.0 in | Wt 167.0 lb

## 2021-02-22 DIAGNOSIS — E538 Deficiency of other specified B group vitamins: Secondary | ICD-10-CM | POA: Diagnosis not present

## 2021-02-22 DIAGNOSIS — R251 Tremor, unspecified: Secondary | ICD-10-CM

## 2021-02-22 DIAGNOSIS — R258 Other abnormal involuntary movements: Secondary | ICD-10-CM | POA: Diagnosis not present

## 2021-02-22 NOTE — Patient Instructions (Addendum)
1.  You have been diagnosed with low B12.  We recommend that you take over the counter B12, 1000 micrograms daily.  2.  We will get the DaT scan scheduled for you.  As a reminder a DaT scan is not a diagnostic scan.  It is an informative scan.  Currently, you do NOT meet criteria for the diagnosis of Parkinsons Disease.    The physicians and staff at [facility] are committed to providing excellent care. You may receive a survey requesting feedback about your experience at our office. We strive to receive "very good" responses to the survey questions. If you feel that your experience would prevent you from giving the office a "very good " response, please contact our office to try to remedy the situation. We may be reached at [phone]. Thank you for taking the time out of your busy day to complete the survey.

## 2021-02-26 DIAGNOSIS — J301 Allergic rhinitis due to pollen: Secondary | ICD-10-CM | POA: Diagnosis not present

## 2021-02-26 DIAGNOSIS — J3089 Other allergic rhinitis: Secondary | ICD-10-CM | POA: Diagnosis not present

## 2021-02-26 DIAGNOSIS — J3081 Allergic rhinitis due to animal (cat) (dog) hair and dander: Secondary | ICD-10-CM | POA: Diagnosis not present

## 2021-03-03 ENCOUNTER — Telehealth: Payer: Self-pay | Admitting: Neurology

## 2021-03-03 NOTE — Telephone Encounter (Signed)
New message    Patient checking on the status of DAT scan schedule.  Please advise

## 2021-03-03 NOTE — Telephone Encounter (Signed)
This is still pending per carolyn. Patient notified

## 2021-03-03 NOTE — Telephone Encounter (Signed)
Will check with Hoyle Sauer after lunch.

## 2021-03-04 DIAGNOSIS — J3081 Allergic rhinitis due to animal (cat) (dog) hair and dander: Secondary | ICD-10-CM | POA: Diagnosis not present

## 2021-03-04 DIAGNOSIS — J3089 Other allergic rhinitis: Secondary | ICD-10-CM | POA: Diagnosis not present

## 2021-03-04 DIAGNOSIS — J301 Allergic rhinitis due to pollen: Secondary | ICD-10-CM | POA: Diagnosis not present

## 2021-03-08 ENCOUNTER — Telehealth: Payer: Self-pay

## 2021-03-08 NOTE — Telephone Encounter (Signed)
Pt needs to f/u with PCP about those.  I am only doing scan for tremor and didn't address HA (its not something I do in my movement practice).

## 2021-03-08 NOTE — Telephone Encounter (Signed)
Spoke to pt wife (Alice)--stated just wanted to Dr. Carles Collet to be aware that pt having headaches, fatigue, and lethargic daily. Please advise  Notified pt wife additional information been re-faxed and awaiting to hear from the insurance.

## 2021-03-08 NOTE — Telephone Encounter (Signed)
Patient's wife called again and left a message requesting a call back from a nurse.   She said he is not doing well with his condition and she needs some helpl

## 2021-03-08 NOTE — Telephone Encounter (Signed)
Notified pt --Dr. Carles Collet recommended to f/u with the PCP regarding the symptoms listed below. Pt voiced understanding and will contact his PCP.

## 2021-03-08 NOTE — Telephone Encounter (Signed)
Still waiting, refaxed awaiting to hear.

## 2021-03-09 ENCOUNTER — Encounter: Payer: Self-pay | Admitting: Family Medicine

## 2021-03-09 ENCOUNTER — Ambulatory Visit: Payer: Medicare PPO | Admitting: Family Medicine

## 2021-03-09 ENCOUNTER — Telehealth: Payer: Self-pay

## 2021-03-09 ENCOUNTER — Other Ambulatory Visit: Payer: Self-pay

## 2021-03-09 VITALS — BP 118/72 | HR 62 | Temp 97.4°F | Wt 165.0 lb

## 2021-03-09 DIAGNOSIS — R259 Unspecified abnormal involuntary movements: Secondary | ICD-10-CM

## 2021-03-09 NOTE — Progress Notes (Signed)
   Subjective:    Patient ID: Shannon Horton., male    DOB: 18-May-1946, 75 y.o.   MRN: [id number]  HPI Here with his wife to follow up on Parkinsonian type symptoms like bradykinesia, shuffling gait, and tremors. He saw Dr. Carles Collet recently and she felt he did not meet the criteria to be diagnosed with Parkinson's disease at this point. She has arrnaged for him to have a [place] brain DaTscan on 03-30-21 to assess his dopamine activity. She has started him on oral B12 1000 mcg tablets daily. Since I saw him he has fallen once in his driveway. He describes a classic bradykinetic scenario where he felt his body was leaning too far forward so he tired to move his feet faster and faster to correct it, until he finally fell forward. He had some scrapes but no significant injuries.    Review of Systems  Constitutional: Positive for fatigue.  Respiratory: Negative.   Cardiovascular: Negative.   Musculoskeletal: Positive for gait problem.  Neurological: Positive for tremors and weakness.       Objective:   Physical Exam Constitutional:      Comments: Walks slowly bent forward slightly at the waist, uses a cane   Cardiovascular:     Rate and Rhythm: Normal rate and regular rhythm.     Pulses: Normal pulses.     Heart sounds: Normal heart sounds.  Pulmonary:     Effort: Pulmonary effort is normal.     Breath sounds: Normal breath sounds.  Neurological:     Mental Status: He is alert and oriented to person, place, and time. Mental status is at baseline.           Assessment & Plan:  He has Parkinsonian symptoms, and we await the results of the DaTscan. He will take daily oral B12 supplements. I encouraged him to exercise daily, but this should be safe forms of exercise. I advised him to not ride his bicycle on the roads but to ride his stationary bike at home instead.  Alysia Penna, MD

## 2021-03-09 NOTE — Telephone Encounter (Signed)
Pt notified of date may 31 at [place], with shannon number in nuclear med. [phone]. Voiced understanding.

## 2021-03-09 NOTE — Telephone Encounter (Signed)
Dat Scan is scheduled for May 31st with approval at  9am. Tired to call patient no answer. Will call back.

## 2021-03-22 ENCOUNTER — Telehealth: Payer: Self-pay | Admitting: Neurology

## 2021-03-22 NOTE — Telephone Encounter (Signed)
They need to contact PCP re: appetite He has a DAT scan from me scheduled for 5/31 I believe and we will follow up after that to discuss next steps.

## 2021-03-22 NOTE — Telephone Encounter (Signed)
Patient's son called in stating feels his father has been rapidly declining. He is unable to ambulate unassisted. His memory, cognition, and apetite are very poor. He would like to try and start him on some medication or anything else to help. He is aware he is not listed on the DPR, but wanted me to relay this message. A return call can be made to the patient or his wife until they can get him added to the [facility].

## 2021-03-22 NOTE — Telephone Encounter (Signed)
I was asked to forward this to you.

## 2021-03-22 NOTE — Telephone Encounter (Signed)
Pt called and advised 

## 2021-03-23 NOTE — Telephone Encounter (Signed)
Returned call to patients wife Shannon Horton and left a message for a call back.

## 2021-03-23 NOTE — Telephone Encounter (Signed)
As with yesterday:  Need to f/u with PCP.  Have they done that?  I cannot help with appetite/weight loss.  PCP will need to look at other sources for falls.  Parkinsons Disease doesn't cause rapid decline but things like UTI can cause sx's to come out.

## 2021-03-23 NOTE — Telephone Encounter (Signed)
Patient's wife called in and left a message returning Shannon Horton's call

## 2021-03-23 NOTE — Telephone Encounter (Signed)
Patient's wife called and said the DAT scan was rescheduled to 04/07/21. In the meantime, she said the patient is falling a lot and his hands are trembling. He is losing weight and rapidly.   Patient's wife wants an appointment this week, if possible. Please review and advise?

## 2021-03-24 ENCOUNTER — Ambulatory Visit: Payer: Medicare PPO | Admitting: Family Medicine

## 2021-03-24 ENCOUNTER — Other Ambulatory Visit: Payer: Self-pay

## 2021-03-24 ENCOUNTER — Encounter: Payer: Self-pay | Admitting: Family Medicine

## 2021-03-24 VITALS — BP 140/80 | HR 77 | Temp 97.9°F | Wt 162.2 lb

## 2021-03-24 DIAGNOSIS — G4452 New daily persistent headache (NDPH): Secondary | ICD-10-CM

## 2021-03-24 DIAGNOSIS — R410 Disorientation, unspecified: Secondary | ICD-10-CM | POA: Diagnosis not present

## 2021-03-24 DIAGNOSIS — R531 Weakness: Secondary | ICD-10-CM

## 2021-03-24 NOTE — Telephone Encounter (Signed)
Spoke to pt wife--stated will call PCP regarding this symptoms.

## 2021-03-24 NOTE — Progress Notes (Signed)
   Subjective:    Patient ID: Shannon Horton., male    DOB: September 28, 1946, 75 y.o.   MRN: [id number]  HPI Here with his wife and son (who is a Marine scientist) to discuss his changing neurologic situation. He is scheduled for a brain dopamine scan on 04-07-21 per orders of Dr. Carles Collet. He has shown some Parkinsonian symptoms in the past few months like bradykinesia and a shuffling gait. However the family says over the past 2 weeks he has declined more rapidly, he gets confused and disoriented at times. He is weak, and he requires assistance with walking. He has fallen several times. His appetite is quite poor and they try to get him to eat more and drink more fluids . There have been no recent medication changes. He complains of a dull constant headache, and has been taking a fair amount of aspirin for this. No chest pain or SOB. We had labs drawn on 02-17-21 that were fairly unremarkable.   Review of Systems  Constitutional: Positive for appetite change and fatigue.  Respiratory: Negative.   Cardiovascular: Negative.   Gastrointestinal: Negative.   Genitourinary: Negative.   Musculoskeletal: Positive for gait problem.  Neurological: Positive for weakness and headaches.       Objective:   Physical Exam Constitutional:      Comments: Frail, walks with a cane   Eyes:     Pupils: Pupils are equal, round, and reactive to light.  Cardiovascular:     Rate and Rhythm: Normal rate and regular rhythm.     Pulses: Normal pulses.     Heart sounds: Normal heart sounds.  Pulmonary:     Effort: Pulmonary effort is normal.     Breath sounds: Normal breath sounds.  Abdominal:     General: Abdomen is flat. Bowel sounds are normal. There is no distension.     Palpations: Abdomen is soft. There is no mass.     Tenderness: There is no abdominal tenderness. There is no guarding or rebound.     Hernia: No hernia is present.  Musculoskeletal:     Cervical back: Neck supple.  Lymphadenopathy:     Cervical: No  cervical adenopathy.  Skin:    General: Skin is warm and dry.  Neurological:     Mental Status: He is alert and oriented to person, place, and time.     Gait: Gait abnormal.           Assessment & Plan:  He is having a rapid decline in neurologic function with cognition problems, weakness, and balance issues. We will get a UA and an ammonia level today. We will set up a non-contrasted head CT asap to look for subdural hematomas, normal pressure hydrocephalus, etc. I encouraged him to keep up his nutrtion and to drink 2-3 bottles of Ensure a day. For the headaches, he should take Tylenol as needed rather than Ibuprofen or ASA. We spent 50 minutes discussing these issues today. Alysia Penna, MD

## 2021-03-25 ENCOUNTER — Inpatient Hospital Stay (HOSPITAL_COMMUNITY): Payer: Medicare PPO

## 2021-03-25 ENCOUNTER — Inpatient Hospital Stay (HOSPITAL_COMMUNITY)
Admission: EM | Admit: 2021-03-25 | Discharge: 2021-04-06 | DRG: 025 | Disposition: A | Discharge status: Rehabilitation Facility | Payer: Medicare PPO | Attending: Neurological Surgery | Admitting: Neurological Surgery

## 2021-03-25 ENCOUNTER — Emergency Department (HOSPITAL_COMMUNITY): Payer: Medicare PPO

## 2021-03-25 ENCOUNTER — Ambulatory Visit (INDEPENDENT_AMBULATORY_CARE_PROVIDER_SITE_OTHER)
Admission: RE | Admit: 2021-03-25 | Discharge: 2021-03-25 | Disposition: A | Discharge status: Home / Self Care | Payer: Medicare PPO | Source: Ambulatory Visit | Attending: Family Medicine | Admitting: Family Medicine

## 2021-03-25 ENCOUNTER — Encounter (HOSPITAL_COMMUNITY): Payer: Self-pay | Admitting: Emergency Medicine

## 2021-03-25 DIAGNOSIS — G4452 New daily persistent headache (NDPH): Secondary | ICD-10-CM | POA: Diagnosis not present

## 2021-03-25 DIAGNOSIS — J189 Pneumonia, unspecified organism: Secondary | ICD-10-CM | POA: Diagnosis not present

## 2021-03-25 DIAGNOSIS — R5383 Other fatigue: Secondary | ICD-10-CM | POA: Diagnosis not present

## 2021-03-25 DIAGNOSIS — R519 Headache, unspecified: Secondary | ICD-10-CM | POA: Diagnosis not present

## 2021-03-25 DIAGNOSIS — E785 Hyperlipidemia, unspecified: Secondary | ICD-10-CM | POA: Diagnosis not present

## 2021-03-25 DIAGNOSIS — F0631 Mood disorder due to known physiological condition with depressive features: Secondary | ICD-10-CM | POA: Diagnosis not present

## 2021-03-25 DIAGNOSIS — Z483 Aftercare following surgery for neoplasm: Secondary | ICD-10-CM | POA: Diagnosis not present

## 2021-03-25 DIAGNOSIS — D72825 Bandemia: Secondary | ICD-10-CM | POA: Diagnosis not present

## 2021-03-25 DIAGNOSIS — C7971 Secondary malignant neoplasm of right adrenal gland: Secondary | ICD-10-CM | POA: Diagnosis present

## 2021-03-25 DIAGNOSIS — Z79899 Other long term (current) drug therapy: Secondary | ICD-10-CM | POA: Diagnosis not present

## 2021-03-25 DIAGNOSIS — G9389 Other specified disorders of brain: Secondary | ICD-10-CM

## 2021-03-25 DIAGNOSIS — H409 Unspecified glaucoma: Secondary | ICD-10-CM | POA: Diagnosis present

## 2021-03-25 DIAGNOSIS — Z87891 Personal history of nicotine dependence: Secondary | ICD-10-CM | POA: Diagnosis not present

## 2021-03-25 DIAGNOSIS — K5900 Constipation, unspecified: Secondary | ICD-10-CM | POA: Diagnosis present

## 2021-03-25 DIAGNOSIS — R590 Localized enlarged lymph nodes: Secondary | ICD-10-CM | POA: Diagnosis present

## 2021-03-25 DIAGNOSIS — I10 Essential (primary) hypertension: Secondary | ICD-10-CM | POA: Diagnosis not present

## 2021-03-25 DIAGNOSIS — Z9109 Other allergy status, other than to drugs and biological substances: Secondary | ICD-10-CM | POA: Diagnosis not present

## 2021-03-25 DIAGNOSIS — Z7951 Long term (current) use of inhaled steroids: Secondary | ICD-10-CM | POA: Diagnosis not present

## 2021-03-25 DIAGNOSIS — K7689 Other specified diseases of liver: Secondary | ICD-10-CM | POA: Diagnosis not present

## 2021-03-25 DIAGNOSIS — Z20822 Contact with and (suspected) exposure to covid-19: Secondary | ICD-10-CM | POA: Diagnosis not present

## 2021-03-25 DIAGNOSIS — R400 Somnolence: Secondary | ICD-10-CM | POA: Diagnosis not present

## 2021-03-25 DIAGNOSIS — J45909 Unspecified asthma, uncomplicated: Secondary | ICD-10-CM | POA: Diagnosis not present

## 2021-03-25 DIAGNOSIS — G8194 Hemiplegia, unspecified affecting left nondominant side: Secondary | ICD-10-CM | POA: Diagnosis not present

## 2021-03-25 DIAGNOSIS — K5901 Slow transit constipation: Secondary | ICD-10-CM | POA: Diagnosis not present

## 2021-03-25 DIAGNOSIS — K769 Liver disease, unspecified: Secondary | ICD-10-CM | POA: Diagnosis present

## 2021-03-25 DIAGNOSIS — R911 Solitary pulmonary nodule: Secondary | ICD-10-CM | POA: Diagnosis not present

## 2021-03-25 DIAGNOSIS — C711 Malignant neoplasm of frontal lobe: Principal | ICD-10-CM | POA: Diagnosis present

## 2021-03-25 DIAGNOSIS — Z85820 Personal history of malignant melanoma of skin: Secondary | ICD-10-CM | POA: Diagnosis not present

## 2021-03-25 DIAGNOSIS — E279 Disorder of adrenal gland, unspecified: Secondary | ICD-10-CM | POA: Diagnosis not present

## 2021-03-25 DIAGNOSIS — G319 Degenerative disease of nervous system, unspecified: Secondary | ICD-10-CM | POA: Diagnosis not present

## 2021-03-25 DIAGNOSIS — Z833 Family history of diabetes mellitus: Secondary | ICD-10-CM

## 2021-03-25 DIAGNOSIS — D432 Neoplasm of uncertain behavior of brain, unspecified: Secondary | ICD-10-CM | POA: Diagnosis not present

## 2021-03-25 DIAGNOSIS — Z7982 Long term (current) use of aspirin: Secondary | ICD-10-CM | POA: Diagnosis not present

## 2021-03-25 DIAGNOSIS — E871 Hypo-osmolality and hyponatremia: Secondary | ICD-10-CM | POA: Diagnosis not present

## 2021-03-25 DIAGNOSIS — G936 Cerebral edema: Secondary | ICD-10-CM | POA: Diagnosis not present

## 2021-03-25 DIAGNOSIS — Z8249 Family history of ischemic heart disease and other diseases of the circulatory system: Secondary | ICD-10-CM

## 2021-03-25 DIAGNOSIS — R531 Weakness: Secondary | ICD-10-CM | POA: Diagnosis present

## 2021-03-25 DIAGNOSIS — I2510 Atherosclerotic heart disease of native coronary artery without angina pectoris: Secondary | ICD-10-CM | POA: Diagnosis not present

## 2021-03-25 DIAGNOSIS — E278 Other specified disorders of adrenal gland: Secondary | ICD-10-CM

## 2021-03-25 DIAGNOSIS — Z8371 Family history of colonic polyps: Secondary | ICD-10-CM | POA: Diagnosis not present

## 2021-03-25 DIAGNOSIS — I629 Nontraumatic intracranial hemorrhage, unspecified: Secondary | ICD-10-CM | POA: Diagnosis not present

## 2021-03-25 DIAGNOSIS — C439 Malignant melanoma of skin, unspecified: Secondary | ICD-10-CM | POA: Diagnosis not present

## 2021-03-25 DIAGNOSIS — I708 Atherosclerosis of other arteries: Secondary | ICD-10-CM | POA: Diagnosis not present

## 2021-03-25 DIAGNOSIS — Z9889 Other specified postprocedural states: Secondary | ICD-10-CM

## 2021-03-25 DIAGNOSIS — I639 Cerebral infarction, unspecified: Secondary | ICD-10-CM | POA: Diagnosis not present

## 2021-03-25 DIAGNOSIS — D496 Neoplasm of unspecified behavior of brain: Secondary | ICD-10-CM | POA: Diagnosis not present

## 2021-03-25 DIAGNOSIS — C7931 Secondary malignant neoplasm of brain: Secondary | ICD-10-CM | POA: Diagnosis not present

## 2021-03-25 DIAGNOSIS — G939 Disorder of brain, unspecified: Secondary | ICD-10-CM

## 2021-03-25 DIAGNOSIS — J309 Allergic rhinitis, unspecified: Secondary | ICD-10-CM | POA: Diagnosis not present

## 2021-03-25 DIAGNOSIS — F32A Depression, unspecified: Secondary | ICD-10-CM | POA: Diagnosis not present

## 2021-03-25 DIAGNOSIS — K449 Diaphragmatic hernia without obstruction or gangrene: Secondary | ICD-10-CM | POA: Diagnosis not present

## 2021-03-25 DIAGNOSIS — I619 Nontraumatic intracerebral hemorrhage, unspecified: Secondary | ICD-10-CM | POA: Diagnosis not present

## 2021-03-25 DIAGNOSIS — M4319 Spondylolisthesis, multiple sites in spine: Secondary | ICD-10-CM | POA: Diagnosis not present

## 2021-03-25 LAB — CBC
HCT: 42.4 % (ref 39.0–52.0)
HCT: 43.6 % (ref 39.0–52.0)
Hemoglobin: 14.5 g/dL (ref 13.0–17.0)
Hemoglobin: 14.8 g/dL (ref 13.0–17.0)
MCH: 31 pg (ref 26.0–34.0)
MCH: 31 pg (ref 26.0–34.0)
MCHC: 34.2 g/dL (ref 30.0–36.0)
MCHC: 33.9 g/dL (ref 30.0–36.0)
MCV: 90.8 fL (ref 80.0–100.0)
MCV: 91.2 fL (ref 80.0–100.0)
Platelets: 245 10*3/uL (ref 150–400)
Platelets: 237 10*3/uL (ref 150–400)
RBC: 4.67 MIL/uL (ref 4.22–5.81)
RBC: 4.78 MIL/uL (ref 4.22–5.81)
RDW: 12.2 % (ref 11.5–15.5)
RDW: 12.1 % (ref 11.5–15.5)
WBC: 6.4 10*3/uL (ref 4.0–10.5)
WBC: 6.1 10*3/uL (ref 4.0–10.5)
nRBC: 0 % (ref 0.0–0.2)
nRBC: 0 % (ref 0.0–0.2)

## 2021-03-25 LAB — COMPREHENSIVE METABOLIC PANEL
ALT: 21 U/L (ref 0–44)
AST: 25 U/L (ref 15–41)
Albumin: 3.8 g/dL (ref 3.5–5.0)
Alkaline Phosphatase: 63 U/L (ref 38–126)
Anion gap: 12 (ref 5–15)
BUN: 9 mg/dL (ref 8–23)
CO2: 25 mmol/L (ref 22–32)
Calcium: 9.3 mg/dL (ref 8.9–10.3)
Chloride: 98 mmol/L (ref 98–111)
Creatinine, Ser: 0.64 mg/dL (ref 0.61–1.24)
GFR, Estimated: >60 mL/min (ref >60)
Glucose, Bld: 126 mg/dL — ABNORMAL HIGH (ref 70–99)
Potassium: 3.7 mmol/L (ref 3.5–5.1)
Sodium: 135 mmol/L (ref 135–145)
Total Bilirubin: 0.7 mg/dL (ref 0.3–1.2)
Total Protein: 6 g/dL — ABNORMAL LOW (ref 6.5–8.1)

## 2021-03-25 LAB — CREATININE, SERUM
Creatinine, Ser: 0.67 mg/dL (ref 0.61–1.24)
GFR, Estimated: >60 mL/min (ref >60)

## 2021-03-25 LAB — AMMONIA: Ammonia: 40 umol/L (ref <=72)

## 2021-03-25 LAB — URINALYSIS
Bilirubin Urine: NEGATIVE
Hgb urine dipstick: NEGATIVE
Ketones, ur: 40 — AB
Leukocytes,Ua: NEGATIVE
Nitrite: NEGATIVE
Specific Gravity, Urine: 1.025 (ref 1.000–1.030)
Total Protein, Urine: NEGATIVE
Urine Glucose: NEGATIVE
Urobilinogen, UA: 1 (ref 0.0–1.0)
pH: 6 (ref 5.0–8.0)

## 2021-03-25 LAB — RESP PANEL BY RT-PCR (FLU A&B, COVID) ARPGX2
Influenza A by PCR: NEGATIVE
Influenza B by PCR: NEGATIVE
SARS Coronavirus 2 by RT PCR: NEGATIVE

## 2021-03-25 MED ORDER — MORPHINE SULFATE (PF) 2 MG/ML IV SOLN: 2.0000 mg | INTRAVENOUS | Status: DC | PRN

## 2021-03-25 MED ORDER — FAMOTIDINE 20 MG PO TABS
20.0000 mg | ORAL_TABLET | Freq: Every day | ORAL | Status: DC
  Administered 2021-03-25 – 2021-04-06 (×13): 20 mg via ORAL
  Filled 2021-03-25 (×14): qty 1

## 2021-03-25 MED ORDER — TELMISARTAN-HCTZ 80-25 MG PO TABS: 1.0000 | ORAL_TABLET | Freq: Every day | ORAL | Status: DC

## 2021-03-25 MED ORDER — ACETAMINOPHEN 650 MG RE SUPP: 650.0000 mg | Freq: Four times a day (QID) | RECTAL | Status: DC | PRN

## 2021-03-25 MED ORDER — IOHEXOL 9 MG/ML PO SOLN
ORAL | Status: AC
  Administered 2021-03-25: 500 mL
  Filled 2021-03-25: qty 1000

## 2021-03-25 MED ORDER — IOHEXOL 300 MG/ML  SOLN
75.0000 mL | Freq: Once | INTRAMUSCULAR | Status: AC | PRN
  Administered 2021-03-25: 75 mL via INTRAVENOUS

## 2021-03-25 MED ORDER — IRBESARTAN 300 MG PO TABS
300.0000 mg | ORAL_TABLET | Freq: Every day | ORAL | Status: DC
  Administered 2021-03-25 – 2021-04-06 (×12): 300 mg via ORAL
  Filled 2021-03-25: qty 1
  Filled 2021-03-25: qty 2
  Filled 2021-03-25: qty 1
  Filled 2021-03-25: qty 2
  Filled 2021-03-25 (×9): qty 1

## 2021-03-25 MED ORDER — ALBUTEROL SULFATE (2.5 MG/3ML) 0.083% IN NEBU
3.0000 mL | INHALATION_SOLUTION | RESPIRATORY_TRACT | Status: DC | PRN
  Administered 2021-03-30: 3 mL via RESPIRATORY_TRACT
  Filled 2021-03-25: qty 3

## 2021-03-25 MED ORDER — ONDANSETRON HCL 4 MG PO TABS: 4.0000 mg | ORAL_TABLET | Freq: Four times a day (QID) | ORAL | Status: DC | PRN

## 2021-03-25 MED ORDER — ONDANSETRON HCL 4 MG/2ML IJ SOLN
4.0000 mg | Freq: Four times a day (QID) | INTRAMUSCULAR | Status: DC | PRN
  Administered 2021-03-31: 4 mg via INTRAVENOUS

## 2021-03-25 MED ORDER — ACETAMINOPHEN 325 MG PO TABS
650.0000 mg | ORAL_TABLET | Freq: Four times a day (QID) | ORAL | Status: DC | PRN
  Administered 2021-03-26 – 2021-04-01 (×2): 650 mg via ORAL
  Filled 2021-03-25 (×2): qty 2

## 2021-03-25 MED ORDER — DEXAMETHASONE SODIUM PHOSPHATE 10 MG/ML IJ SOLN
10.0000 mg | Freq: Once | INTRAMUSCULAR | Status: AC
  Administered 2021-03-25: 10 mg via INTRAVENOUS
  Filled 2021-03-25: qty 1

## 2021-03-25 MED ORDER — GADOBUTROL 1 MMOL/ML IV SOLN
7.0000 mL | Freq: Once | INTRAVENOUS | Status: AC | PRN
  Administered 2021-03-25: 7 mL via INTRAVENOUS

## 2021-03-25 MED ORDER — HYDROCODONE-ACETAMINOPHEN 5-325 MG PO TABS: 1.0000 | ORAL_TABLET | ORAL | Status: DC | PRN

## 2021-03-25 MED ORDER — HEPARIN SODIUM (PORCINE) 5000 UNIT/ML IJ SOLN
5000.0000 [IU] | Freq: Three times a day (TID) | INTRAMUSCULAR | Status: DC
  Administered 2021-03-25 – 2021-03-28 (×10): 5000 [IU] via SUBCUTANEOUS
  Filled 2021-03-25 (×10): qty 1

## 2021-03-25 MED ORDER — AMLODIPINE BESYLATE 5 MG PO TABS
5.0000 mg | ORAL_TABLET | Freq: Every day | ORAL | Status: DC
  Administered 2021-03-25 – 2021-04-06 (×12): 5 mg via ORAL
  Filled 2021-03-25 (×12): qty 1

## 2021-03-25 MED ORDER — DOCUSATE SODIUM 100 MG PO CAPS
100.0000 mg | ORAL_CAPSULE | Freq: Two times a day (BID) | ORAL | Status: DC
  Administered 2021-03-25 – 2021-04-06 (×23): 100 mg via ORAL
  Filled 2021-03-25 (×23): qty 1

## 2021-03-25 MED ORDER — DEXAMETHASONE SODIUM PHOSPHATE 4 MG/ML IJ SOLN
4.0000 mg | Freq: Four times a day (QID) | INTRAMUSCULAR | Status: DC
  Administered 2021-03-25 – 2021-04-02 (×31): 4 mg via INTRAVENOUS
  Filled 2021-03-25 (×31): qty 1

## 2021-03-25 MED ORDER — HYDROCHLOROTHIAZIDE 25 MG PO TABS
25.0000 mg | ORAL_TABLET | Freq: Every day | ORAL | Status: DC
  Administered 2021-03-25 – 2021-04-06 (×12): 25 mg via ORAL
  Filled 2021-03-25 (×12): qty 1

## 2021-03-25 MED ORDER — LATANOPROST 0.005 % OP SOLN
1.0000 [drp] | Freq: Every day | OPHTHALMIC | Status: DC
  Administered 2021-03-25 – 2021-04-05 (×11): 1 [drp] via OPHTHALMIC
  Filled 2021-03-25 (×2): qty 2.5

## 2021-03-25 NOTE — H&P (Signed)
Providing Compassionate, Quality Care - Together  NEUROSURGERY HISTORY & PHYSICAL   Shannon Horton. is an 75 y.o. male.   Chief Complaint: Weakness/off-balance HPI: This is a 75 year old male, right-handed, retired Pharmacist, hospital, who complains of approximately 4-6 weeks of intermittent generalized weakness, feeling off balance and not "feeling himself".  He denies any focal seizures, numbness or tingling or focal weakness at this time.  He does have a history of remote melanoma x2 removed from his right calf and left flank in 2011 with negative nodal biopsy.  He also has a history of hypertension.  At this time he denies any other complaints whatsoever.  He had currently been being worked up for Parkinson's disease by his primary care physician and neurology as an outpatient but had progressive headaches and therefore a CT brain was obtained which revealed a right frontal tumor.  Past Medical History:  Diagnosis Date  . Allergic rhinitis    gets shots per Dr. Velora Heckler  . Allergy    sees Dr. Harold Hedge   . Asthma   . Glaucoma    sees Dr. Satira Sark   . Hypertension   . Malignant melanoma ([place])    right lower leg, diagnosed on1/21/11    Past Surgical History:  Procedure Laterality Date  . CATARACT EXTRACTION     right ey, Dr. Satira Sark   . COLONOSCOPY  12/15/2015   per Dr.Kaplan, clear, no repeats   . ESOPHAGOGASTRODUODENOSCOPY     WITH ESOPHAGEAL DILATION 2006 PER dR. kAPLAN  . REMOVAL  of melanoma  12/08/09   from right calf; per Dr. Monica Becton  . removal of melanoma from left abdomen  01/21/10    per Dr. Meredith Leeds  . SQUAMOUS CELL CARCINOMA EXCISION  2013   per Dr. Terrence Dupont, from top of scalp     Family History  Problem Relation Age of Onset  . Hypertension Other   . Colon polyps Mother   . Hypertension Mother   . Diabetes Father   . Liver disease Maternal Uncle   . Cancer Brother        throat and lung (smoker)  . Colon cancer Neg Hx   . Stomach cancer Neg Hx   .  Esophageal cancer Neg Hx   . Pancreatic cancer Neg Hx    Social History:  reports that he has quit smoking. He has never used smokeless tobacco. He reports current alcohol use of about 5.0 - 10.0 standard drinks of alcohol per week. He reports that he does not use drugs.  Allergies:  Allergies  Allergen Reactions  . Banana Rash    (Not in a hospital admission)   Results for orders placed or performed during the hospital encounter of 03/25/21 (from the past 48 hour(s))  Comprehensive metabolic panel     Status: Abnormal   Collection Time: 03/25/21 10:03 AM  Result Value Ref Range   Sodium 135 135 - 145 mmol/L   Potassium 3.7 3.5 - 5.1 mmol/L   Chloride 98 98 - 111 mmol/L   CO2 25 22 - 32 mmol/L   Glucose, Bld 126 (H) 70 - 99 mg/dL    Comment: Glucose reference range applies only to samples taken after fasting for at least 8 hours.   BUN 9 8 - 23 mg/dL   Creatinine, Ser 0.64 0.61 - 1.24 mg/dL   Calcium 9.3 8.9 - 10.3 mg/dL   Total Protein 6.0 (L) 6.5 - 8.1 g/dL   Albumin 3.8 3.5 - 5.0 g/dL  AST 25 15 - 41 U/L   ALT 21 0 - 44 U/L   Alkaline Phosphatase 63 38 - 126 U/L   Total Bilirubin 0.7 0.3 - 1.2 mg/dL   GFR, Estimated >60 >60 mL/min    Comment: (NOTE) Calculated using the CKD-EPI Creatinine Equation (2021)    Anion gap 12 5 - 15    Comment: Performed at [facility] [address], [place], [place] [address]  CBC     Status: None   Collection Time: 03/25/21 10:03 AM  Result Value Ref Range   WBC 6.4 4.0 - 10.5 K/uL   RBC 4.67 4.22 - 5.81 MIL/uL   Hemoglobin 14.5 13.0 - 17.0 g/dL   HCT 42.4 39.0 - 52.0 %   MCV 90.8 80.0 - 100.0 fL   MCH 31.0 26.0 - 34.0 pg   MCHC 34.2 30.0 - 36.0 g/dL   RDW 12.2 11.5 - 15.5 %   Platelets 245 150 - 400 K/uL   nRBC 0.0 0.0 - 0.2 %    Comment: Performed at [facility], [place] [address], [place], [place] [address]  Resp Panel by RT-PCR (Flu A&B, Covid) Nasopharyngeal Swab     Status: None   Collection Time: 03/25/21  10:03 AM   Specimen: Nasopharyngeal Swab; Nasopharyngeal(NP) swabs in vial transport medium  Result Value Ref Range   SARS Coronavirus 2 by RT PCR NEGATIVE NEGATIVE    Comment: (NOTE) SARS-CoV-2 target nucleic acids are NOT DETECTED.  The SARS-CoV-2 RNA is generally detectable in upper respiratory specimens during the acute phase of infection. The lowest concentration of SARS-CoV-2 viral copies this assay can detect is 138 copies/mL. A negative result does not preclude SARS-Cov-2 infection and should not be used as the sole basis for treatment or other patient management decisions. A negative result may occur with  improper specimen collection/handling, submission of specimen other than nasopharyngeal swab, presence of viral mutation(s) within the areas targeted by this assay, and inadequate number of viral copies(<138 copies/mL). A negative result must be combined with clinical observations, patient history, and epidemiological information. The expected result is Negative.  Fact Sheet for Patients:  EntrepreneurPulse.com.au  Fact Sheet for Healthcare Providers:  IncredibleEmployment.be  This test is no t yet approved or cleared by the [place] FDA and  has been authorized for detection and/or diagnosis of SARS-CoV-2 by FDA under an Emergency Use Authorization (EUA). This EUA will remain  in effect (meaning this test can be used) for the duration of the COVID-19 declaration under Section 564(b)(1) of the Act, 21 U.S.C.section 360bbb-3(b)(1), unless the authorization is terminated  or revoked sooner.       Influenza A by PCR NEGATIVE NEGATIVE   Influenza B by PCR NEGATIVE NEGATIVE    Comment: (NOTE) The Xpert Xpress SARS-CoV-2/FLU/RSV plus assay is intended as an aid in the diagnosis of influenza from Nasopharyngeal swab specimens and should not be used as a sole basis for treatment. Nasal washings and aspirates are unacceptable for  Xpert Xpress SARS-CoV-2/FLU/RSV testing.  Fact Sheet for Patients: EntrepreneurPulse.com.au  Fact Sheet for Healthcare Providers: IncredibleEmployment.be  This test is not yet approved or cleared by the [place] FDA and has been authorized for detection and/or diagnosis of SARS-CoV-2 by FDA under an Emergency Use Authorization (EUA). This EUA will remain in effect (meaning this test can be used) for the duration of the COVID-19 declaration under Section 564(b)(1) of the Act, 21 U.S.C. section 360bbb-3(b)(1), unless the authorization is terminated or  revoked.  Performed at [facility], [place] [address] [place], [place] [address]    CT Head Wo Contrast  Result Date: 03/25/2021 CLINICAL DATA:  Gait disorder. Intracranial hemorrhage is suspected. EXAM: CT HEAD WITHOUT CONTRAST TECHNIQUE: Contiguous axial images were obtained from the base of the skull through the vertex without intravenous contrast. COMPARISON:  None. FINDINGS: Brain: Large irregular focus of hyperdensity centered in the right basal ganglia with a large amount of vasogenic edema. Findings are compatible with intracranial hemorrhage. The irregular hemorrhagic lesion measures 4.1 x 2.8 x 3.4 cm. Hemorrhage extends down to the right temporal lobe region. There is 6 mm right to left midline shift. There is mild effacement of the right lateral ventricle. No significant intraventricular hemorrhage. No hydrocephalus. Vascular: No hyperdense vessel or unexpected calcification. Skull: Normal. Negative for fracture or focal lesion. Sinuses/Orbits: No acute finding. Other: None. IMPRESSION: 1. Large irregular intracranial hemorrhage centered in the right basal ganglia with a large amount of surrounding vasogenic edema. The morphology and vasogenic edema raises concern for an underlying hemorrhagic lesion. Recommend further characterization with MRI, with and without contrast, to evaluate for a  neoplastic process. 2. 6 mm of midline shift. Critical Value/emergent results were called by telephone at the time of interpretation on 03/25/2021 at 8:48 am to provider [facility] , who verbally acknowledged these results. Electronically Signed   By: Markus Daft M.D.   On: 03/25/2021 08:54   MR Brain W and Wo Contrast  Result Date: 03/25/2021 CLINICAL DATA:  Metastatic disease evaluation. Headaches and balance issues for 3 weeks. Found to have intracranial hemorrhage. EXAM: MRI HEAD WITHOUT AND WITH CONTRAST TECHNIQUE: Multiplanar, multiecho pulse sequences of the brain and surrounding structures were obtained without and with intravenous contrast. CONTRAST:  59mL GADAVIST GADOBUTROL 1 MMOL/ML IV SOLN COMPARISON:  Same day CT head. FINDINGS: Brain: There is a solidly enhancing hemorrhagic mass centered in the right basal ganglia/frontal lobe that measures up to 3.4 x 3.8 by 2.9 cm (AP by transverse by craniocaudal). There are a few areas of intrinsic T1 hyperintensity within the lateral and medial aspect of the lesion and marked susceptibility artifact throughout the lesion. There is exuberant surrounding vasogenic edema with mass effect. There is partial effacement of the right lateral ventricle anteriorly and approximately 5 mm of leftward midline shift near the foramina Burke. No definite evidence of hydrocephalus. Basal cisterns are patent. No extra-axial fluid collection. Enhancing 6 mm focus of restricted diffusion anteromedial to the mass in the region of the caudate, likely an acute or eary subacute infarct. Additional patchy white matter T2/FLAIR hyperintensities likely represent chronic microvascular ischemic disease. No definite other enhancing lesions identified. Vascular: Major arterial flow voids are maintained at the skull base. Incidental high left parietal developmental venous anomaly. Skull and upper cervical spine: Normal marrow signal. Sinuses/Orbits: Clear sinuses.  Unremarkable orbits.  Other: No mastoid effusions. IMPRESSION: 1. Large, 3.8 cm enhancing and hemorrhagic mass within the right basal ganglia/frontal lobe, which is concerning for a metastasis in this patient with a history of melanoma. High-grade glioma is a differential consideration. 2. Exuberant surrounding vasogenic edema and mass effect with partial effacement of the right lateral ventricle and approximately 5 mm of leftward midline shift. 3. Enhancing 6 mm focus of restricted diffusion anteromedial to the mass in the region of the caudate, likely an acute or eary subacute infarct. Recommend attention on follow-up to exclude an additional lesion. 4. No other enhancing lesions identified. Electronically Signed   By:  Margaretha Sheffield MD   On: 03/25/2021 12:16   DG Chest Portable 1 View  Result Date: 03/25/2021 CLINICAL DATA:  Unsteadiness, headaches, imbalance, intracranial hemorrhage EXAM: PORTABLE CHEST 1 VIEW COMPARISON:  Portable exam 1005 hours compared to 03/11/2013 FINDINGS: Normal heart size, mediastinal contours, and pulmonary vascularity. Atherosclerotic calcification aorta. Lungs clear. No pulmonary infiltrate, pleural effusion or pneumothorax. Probable small enchondroma proximal LEFT humerus. Osseous demineralization. IMPRESSION: No acute abnormalities. Aortic Atherosclerosis (ICD10-I70.0). Electronically Signed   By: Lavonia Dana M.D.   On: 03/25/2021 10:25    ROS All pertinent positives and negatives are listed in HPI above  Blood pressure (!) 163/88, pulse 64, temperature 97.9 F (36.6 C), temperature source Oral, resp. rate 11, height 5\' 8"  (1.727 m), weight 73.5 kg, SpO2 98 %. Physical Exam  Awake alert oriented x3 Cranial nerves II through XII intact Face symmetric EOMI PERRLA Sensory intact light touch No drift Normal coordination Strength symmetric bilateral upper/lower extremity, full  Assessment/Plan 75 year old male with  1.  Right basal ganglia tumor, unknown etiology  -Admit to  floor -We will obtain CT chest abdomen pelvis, MRI for surgical planning with neuro navigation protocol -Decadron 4 every 6 -Hold aspirin, last dose was 03/23/2021 -Likely will need surgical resection of this tumor.  I described this to his wife into the patient himself.  They understand that we are unsure of the pathology although there is a possibility this could be metastatic melanoma. -Blood pressure control   Thank you for allowing me to participate in this patient's care.  Please do not hesitate to call with questions or concerns.   Elwin Sleight, [facility] Cell: [phone]

## 2021-03-25 NOTE — ED Notes (Signed)
pt was in the bathroom and states while he was wiping he bumped his head slightly. Redness noted. Pt denies any pain,dizziness,blurred vision. Neuro intact . Denies taking any blood thinners. Provider made aware

## 2021-03-25 NOTE — ED Notes (Signed)
Pt is in MRI  

## 2021-03-25 NOTE — ED Provider Notes (Signed)
Shannon Horton [facility] EMERGENCY DEPARTMENT Provider Note   CSN: [id number] Arrival date & time: 03/25/21  4765     History Chief Complaint  Patient presents with  . Headache    Shannon Horton. is a 75 y.o. male.  HPI Patient presents with an unsteadiness for the last 3 weeks.  Has been seen by PCP in the neurology and PCP again.  There was thought of potentially parkinsonian cause.  Has had falls.  However CT scan done as an outpatient today and showed intracranial hemorrhage with shift and edema and potentially underlying malignancy.  Patient does have previous melanoma history but had not been metastatic.  No seizures.  No fevers or chills.  Not on blood thinners.    Past Medical History:  Diagnosis Date  . Allergic rhinitis    gets shots per Dr. Velora Heckler  . Allergy    sees Dr. Harold Hedge   . Asthma   . Glaucoma    sees Dr. Satira Sark   . Hypertension   . Malignant melanoma ([place])    right lower leg, diagnosed on1/21/11    Patient Active Problem List   Diagnosis Date Noted  . Dyslipidemia 08/29/2017  . Glaucoma 09/16/2015  . MELANOMA, TRUNK 01/27/2010  . MELANOMA, LEG, RIGHT 11/30/2009  . Disorder of skin or subcutaneous tissue 11/11/2009  . KNEE SPRAIN 06/23/2009  . ELBOW SPRAIN 03/06/2008  . Essential hypertension 06/07/2007  . Allergic rhinitis 06/07/2007  . Asthma 06/07/2007    Past Surgical History:  Procedure Laterality Date  . CATARACT EXTRACTION     right ey, Dr. Satira Sark   . COLONOSCOPY  12/15/2015   per Dr.Kaplan, clear, no repeats   . ESOPHAGOGASTRODUODENOSCOPY     WITH ESOPHAGEAL DILATION 2006 PER dR. kAPLAN  . REMOVAL  of melanoma  12/08/09   from right calf; per Dr. Monica Becton  . removal of melanoma from left abdomen  01/21/10    per Dr. Meredith Leeds  . SQUAMOUS CELL CARCINOMA EXCISION  2013   per Dr. Terrence Dupont, from top of scalp        Family History  Problem Relation Age of Onset  . Hypertension Other   . Colon polyps Mother   .  Hypertension Mother   . Diabetes Father   . Liver disease Maternal Uncle   . Cancer Brother        throat and lung (smoker)  . Colon cancer Neg Hx   . Stomach cancer Neg Hx   . Esophageal cancer Neg Hx   . Pancreatic cancer Neg Hx     Social History   Tobacco Use  . Smoking status: Former Research scientist (life sciences)  . Smokeless tobacco: Never Used  . Tobacco comment: quit 30 years ago  Vaping Use  . Vaping Use: Never used  Substance Use Topics  . Alcohol use: Yes    Alcohol/week: 5.0 - 10.0 standard drinks    Types: 5 - 10 Standard drinks or equivalent per week    Comment: 1-2 beers/day  . Drug use: No    Home Medications Prior to Admission medications   Medication Sig Start Date End Date Taking? Authorizing Provider  acetaminophen (TYLENOL) 500 MG tablet Take 1,000 mg by mouth every 6 (six) hours as needed for mild pain.   Yes [provider]  albuterol (PROVENTIL HFA;VENTOLIN HFA) 108 (90 Base) MCG/ACT inhaler Inhale 2 puffs into the lungs every 4 (four) hours as needed for wheezing or shortness of breath. 12/05/17  Yes Burchette,  Alinda Sierras, MD  amLODipine (NORVASC) 5 MG tablet Take 1 tablet (5 mg total) by mouth daily. 09/10/20  Yes Laurey Morale, MD  aspirin 325 MG EC tablet Take 325 mg by mouth every 6 (six) hours as needed for pain (alt with ibuprofen).   Yes [provider]  aspirin 81 MG EC tablet Take 81 mg by mouth daily.   Yes [provider]  bimatoprost (LUMIGAN) 0.03 % ophthalmic solution Place 1 drop into both eyes at bedtime.   Yes [provider]  brinzolamide (AZOPT) 1 % ophthalmic suspension Place 1 drop into both eyes 2 (two) times daily.   Yes [provider]  Coenzyme Q10 200 MG capsule Take 200 mg by mouth daily. 10/22/18  Yes [provider]  EPINEPHrine 0.3 mg/0.3 mL IJ SOAJ injection Inject 0.3 mg into the muscle See admin instructions. 08/19/19  Yes [provider]  Fluticasone Furoate (ARNUITY ELLIPTA) 200  MCG/ACT AEPB Inhale 1 puff into the lungs daily.   Yes [provider]  ibuprofen (ADVIL) 200 MG tablet Take 200-400 mg by mouth every 6 (six) hours as needed for headache.   Yes [provider]  Multiple Vitamin (MULTIVITAMIN) tablet Take 1 tablet by mouth daily. One A Day   Yes [provider]  telmisartan-hydrochlorothiazide (MICARDIS HCT) 80-25 MG tablet TAKE 1 TABLET BY MOUTH EVERY DAY 07/22/20  Yes Laurey Morale, MD  UNABLE TO FIND Allergy shots at [facility] every month   Yes [provider]    Allergies    Banana  Review of Systems   Review of Systems  Constitutional: Negative for appetite change.  HENT: Negative for congestion.   Respiratory: Negative for shortness of breath.   Cardiovascular: Negative for chest pain.  Gastrointestinal: Negative for abdominal pain.  Genitourinary: Negative for flank pain.  Musculoskeletal: Negative for back pain.  Skin: Negative for wound.  Neurological: Positive for headaches. Negative for speech difficulty.  Psychiatric/Behavioral: Negative for confusion.    Physical Exam Updated Vital Signs BP (!) 155/72   Pulse 65   Temp 97.9 F (36.6 C) (Oral)   Resp 13   Ht 5\' 8"  (1.727 m)   Wt 73.5 kg   SpO2 100%   BMI 24.63 kg/m   Physical Exam Vitals and nursing note reviewed.  HENT:     Head: Normocephalic and atraumatic.  Eyes:     Extraocular Movements:     Right eye: No nystagmus.     Left eye: No nystagmus.  Cardiovascular:     Rate and Rhythm: Regular rhythm.  Pulmonary:     Breath sounds: Normal breath sounds.  Musculoskeletal:     Cervical back: Normal range of motion.  Skin:    General: Skin is warm.  Neurological:     Mental Status: He is alert and oriented to person, place, and time.     Comments: Awake and appropriate.  Answers questions.  Finger-nose intact bilaterally.  Eye movements intact.  Appears to have a left-sided facial droop however.   Psychiatric:        Behavior: Behavior normal.     ED Results / Procedures / Treatments   Labs (all labs ordered are listed, but only abnormal results are displayed) Labs Reviewed  COMPREHENSIVE METABOLIC PANEL - Abnormal; Notable for the following components:      Result Value   Glucose, Bld 126 (*)    Total Protein 6.0 (*)    All other components  within normal limits  RESP PANEL BY RT-PCR (FLU A&B, COVID) [id number]  CBC    EKG EKG Interpretation  Date/Time:  Thursday Mar 25 2021 09:22:31 EDT Ventricular Rate:  71 PR Interval:  155 QRS Duration: 122 QT Interval:  374 QTC Calculation: 407 R Axis:   -67 Text Interpretation: Sinus rhythm Nonspecific IVCD with LAD ST elevation, consider inferior injury Confirmed by Davonna Belling [phone]) on 03/25/2021 9:46:15 AM   Radiology CT Head Wo Contrast  Result Date: 03/25/2021 CLINICAL DATA:  Gait disorder. Intracranial hemorrhage is suspected. EXAM: CT HEAD WITHOUT CONTRAST TECHNIQUE: Contiguous axial images were obtained from the base of the skull through the vertex without intravenous contrast. COMPARISON:  None. FINDINGS: Brain: Large irregular focus of hyperdensity centered in the right basal ganglia with a large amount of vasogenic edema. Findings are compatible with intracranial hemorrhage. The irregular hemorrhagic lesion measures 4.1 x 2.8 x 3.4 cm. Hemorrhage extends down to the right temporal lobe region. There is 6 mm right to left midline shift. There is mild effacement of the right lateral ventricle. No significant intraventricular hemorrhage. No hydrocephalus. Vascular: No hyperdense vessel or unexpected calcification. Skull: Normal. Negative for fracture or focal lesion. Sinuses/Orbits: No acute finding. Other: None. IMPRESSION: 1. Large irregular intracranial hemorrhage centered in the right basal ganglia with a large amount of surrounding vasogenic edema. The morphology and vasogenic edema raises concern for an underlying  hemorrhagic lesion. Recommend further characterization with MRI, with and without contrast, to evaluate for a neoplastic process. 2. 6 mm of midline shift. Critical Value/emergent results were called by telephone at the time of interpretation on 03/25/2021 at 8:48 am to provider [facility] , who verbally acknowledged these results. Electronically Signed   By: Markus Daft M.D.   On: 03/25/2021 08:54   MR Brain W and Wo Contrast  Result Date: 03/25/2021 CLINICAL DATA:  Metastatic disease evaluation. Headaches and balance issues for 3 weeks. Found to have intracranial hemorrhage. EXAM: MRI HEAD WITHOUT AND WITH CONTRAST TECHNIQUE: Multiplanar, multiecho pulse sequences of the brain and surrounding structures were obtained without and with intravenous contrast. CONTRAST:  28mL GADAVIST GADOBUTROL 1 MMOL/ML IV SOLN COMPARISON:  Same day CT head. FINDINGS: Brain: There is a solidly enhancing hemorrhagic mass centered in the right basal ganglia/frontal lobe that measures up to 3.4 x 3.8 by 2.9 cm (AP by transverse by craniocaudal). There are a few areas of intrinsic T1 hyperintensity within the lateral and medial aspect of the lesion and marked susceptibility artifact throughout the lesion. There is exuberant surrounding vasogenic edema with mass effect. There is partial effacement of the right lateral ventricle anteriorly and approximately 5 mm of leftward midline shift near the foramina [place]. No definite evidence of hydrocephalus. Basal cisterns are patent. No extra-axial fluid collection. Enhancing 6 mm focus of restricted diffusion anteromedial to the mass in the region of the caudate, likely an acute or eary subacute infarct. Additional patchy white matter T2/FLAIR hyperintensities likely represent chronic microvascular ischemic disease. No definite other enhancing lesions identified. Vascular: Major arterial flow voids are maintained at the skull base. Incidental high left parietal developmental venous anomaly.  Skull and upper cervical spine: Normal marrow signal. Sinuses/Orbits: Clear sinuses.  Unremarkable orbits. Other: No mastoid effusions. IMPRESSION: 1. Large, 3.8 cm enhancing and hemorrhagic mass within the right basal ganglia/frontal lobe, which is concerning for a metastasis in this patient with a history of melanoma. High-grade glioma is a differential consideration. 2. Exuberant surrounding vasogenic edema and mass effect with partial  effacement of the right lateral ventricle and approximately 5 mm of leftward midline shift. 3. Enhancing 6 mm focus of restricted diffusion anteromedial to the mass in the region of the caudate, likely an acute or eary subacute infarct. Recommend attention on follow-up to exclude an additional lesion. 4. No other enhancing lesions identified. Electronically Signed   By: Margaretha Sheffield MD   On: 03/25/2021 12:16   DG Chest Portable 1 View  Result Date: 03/25/2021 CLINICAL DATA:  Unsteadiness, headaches, imbalance, intracranial hemorrhage EXAM: PORTABLE CHEST 1 VIEW COMPARISON:  Portable exam 1005 hours compared to 03/11/2013 FINDINGS: Normal heart size, mediastinal contours, and pulmonary vascularity. Atherosclerotic calcification aorta. Lungs clear. No pulmonary infiltrate, pleural effusion or pneumothorax. Probable small enchondroma proximal LEFT humerus. Osseous demineralization. IMPRESSION: No acute abnormalities. Aortic Atherosclerosis (ICD10-I70.0). Electronically Signed   By: Lavonia Dana M.D.   On: 03/25/2021 10:25    Procedures Procedures   Medications Ordered in ED Medications  gadobutrol (GADAVIST) 1 MMOL/ML injection 7 mL (7 mLs Intravenous Contrast Given 03/25/21 1141)  dexamethasone (DECADRON) injection 10 mg (10 mg Intravenous Given 03/25/21 1255)    ED Course  I have reviewed the triage vital signs and the nursing notes.  Pertinent labs & imaging results that were available during my care of the patient were reviewed by me and considered in my  medical decision making (see chart for details).    MDM Rules/Calculators/A&P                          Patient sent in with CT scan showing intracranial hemorrhage or potentially mass.  Patient only had symptoms for around 3 weeks the patient states later that has been at least 6 weeks with some symptoms.  More unsteadiness.  Previous history of melanoma.  MRI done and showed likely hemorrhagic mass.  Also has 5 mm of shift.  Mass thought on MRI to be melanoma versus potentially glioma.  Did not take his blood pressure medicine the last couple days.  Not on anticoagulation.  Has been unsteady with falls and appears to have a little left-sided facial droop.  Will discuss with neurosurgery. Neurosurgery Dr. Reatha Armour request admission to internal medicine.  We will give steroids here.  We will not have any biopsy done today and can eat. Discussed with Dr. Joycelyn Schmid from internal medicine.  He believes that the patient should be admitted primarily to the neurosurgery service and they will consult.  I will discuss again with neurosurgery.  Discussed with neurosurgery.  Hospitalist had consulted and will be admitted primarily to neurosurgery.  Of note patient later stumbled while in the bathroom and bumped his head.  Mild hematoma but normal mental status.  Will not reimage at this time.   Final Clinical Impression(s) / ED Diagnoses Final diagnoses:  Brain mass    Rx / DC Orders ED Discharge Orders    None       Davonna Belling, MD 03/25/21 1517

## 2021-03-25 NOTE — ED Triage Notes (Signed)
Pt reports he'd been having headaches and balance issues for the last 3 weeks. Seen by PMD yesterday who scanned head and found ICH with 54mm shift. Pt awake, alert, appropriate, oriented x4, denies pain.

## 2021-03-26 ENCOUNTER — Inpatient Hospital Stay (HOSPITAL_COMMUNITY): Payer: Medicare PPO

## 2021-03-26 ENCOUNTER — Other Ambulatory Visit: Payer: Self-pay | Admitting: Neurological Surgery

## 2021-03-26 MED ORDER — GADOBUTROL 1 MMOL/ML IV SOLN
7.5000 mL | Freq: Once | INTRAVENOUS | Status: AC | PRN
  Administered 2021-03-26: 7.5 mL via INTRAVENOUS

## 2021-03-26 MED ORDER — DIAZEPAM 5 MG/ML IJ SOLN
5.0000 mg | Freq: Once | INTRAMUSCULAR | Status: AC
  Administered 2021-03-26: 5 mg via INTRAVENOUS
  Filled 2021-03-26: qty 2

## 2021-03-26 NOTE — Progress Notes (Addendum)
RN receives a call from [facility] radiology concerning pt CT scan results. MD office called and on-call MD paged and notified. Awaiting on response from MD. Will continue to closely monitor pt. Pt sleeping quietly with call light within reach. Francis Gaines Zorina Mallin RN  0100: Dr. Reatha Armour response to RN page and is notified of pt CT chest and Abdomen results. No new orders received. Delia Heady RN

## 2021-03-26 NOTE — Progress Notes (Signed)
   Providing Compassionate, Quality Care - Together  NEUROSURGERY PROGRESS NOTE   S: No issues overnight.   O: EXAM:  BP 126/78   Pulse 63   Temp 98.4 F (36.9 C) (Oral)   Resp 16   Ht 5\' 8"  (1.727 m)   Wt 73.5 kg   SpO2 95%   BMI 24.63 kg/m   Awake, alert, oriented  PERRL EOMI Speech fluent, appropriate  CNs grossly intact  5/5 RUE/RLE  4+/5 LUE/LLE Min drift on left  ASSESSMENT:  75 y.o. male with   1. Right BG tumor, ? primary  PLAN: - CT CAP done, adrenal mass and lung nodules - MRI brainlab protocol pending - dvt ppx - dex 4q6 - surgery planned for Wednesday (once off aspirin for 7 days/available OR time) - consult to oncology given adrenal mass    Thank you for allowing me to participate in this patient's care.  Please do not hesitate to call with questions or concerns.   Elwin Sleight, [facility] Cell: [phone]

## 2021-03-27 MED ORDER — BRINZOLAMIDE 1 % OP SUSP
1.0000 [drp] | Freq: Two times a day (BID) | OPHTHALMIC | Status: DC
  Administered 2021-03-27 – 2021-04-06 (×18): 1 [drp] via OPHTHALMIC
  Filled 2021-03-27 (×2): qty 10

## 2021-03-27 NOTE — Progress Notes (Signed)
Subjective: Patient reports that he is doing well. No acute events overnight.   Objective: Vital signs in last 24 hours: Temp:  [98 F (36.7 C)-98.3 F (36.8 C)] 98.3 F (36.8 C) (05/28 0827) Pulse Rate:  [47-59] 47 (05/28 0827) Resp:  [18-20] 18 (05/28 0827) BP: (116-150)/(59-83) 140/83 (05/28 0827) SpO2:  [96 %-98 %] 96 % (05/28 0827)  Intake/Output from previous day: No intake/output data recorded. Intake/Output this shift: No intake/output data recorded.  Physical Exam: Patient is awake, A/O X 4, conversant, and in good spirits. PERRLA. EOMI. Speech is fluent and appropriate. MAEW RUE and RLE 5/5, LUE and LLE 4+/5. CNs grossly intact.    Lab Results: Recent Labs    03/25/21 1003 03/25/21 1707  WBC 6.4 6.1  HGB 14.5 14.8  HCT 42.4 43.6  PLT 245 237   BMET Recent Labs    03/25/21 1003 03/25/21 1707  NA 135  --   K 3.7  --   CL 98  --   CO2 25  --   GLUCOSE 126*  --   BUN 9  --   CREATININE 0.64 0.67  CALCIUM 9.3  --     Studies/Results: MR BRAIN W WO CONTRAST  Result Date: 03/27/2021 CLINICAL DATA:  Follow-up examination for brain mass. EXAM: MRI HEAD WITHOUT AND WITH CONTRAST TECHNIQUE: Multiplanar, multiecho pulse sequences of the brain and surrounding structures were obtained without and with intravenous contrast. CONTRAST:  7.14mL GADAVIST GADOBUTROL 1 MMOL/ML IV SOLN COMPARISON:  Recent brain MRI from 03/25/2021. FINDINGS: Brain: Previously identified hemorrhagic mass centered at the right basal ganglia again seen, not significantly changed measuring 3.3 x 3.2 x 2.6 cm on this exam. Scattered areas of intrinsic T1 hyperintensity with susceptibility artifact consistent with hemorrhage. Few subcentimeter nodular foci of enhancement seen involving the adjacent right caudate and basal ganglia favored to reflect small satellite lesions rather than subacute ischemia (series 9, images 128, 122). The primary mass both involves and splays the surrounding radiating white  matter tracts. Prominent surrounding T2/FLAIR signal abnormality throughout the adjacent right frontotemporal region. Mass effect on the adjacent right lateral ventricle which is partially effaced. Associated right-to-left shift measures up to 5 mm. No hydrocephalus or trapping. Basilar cisterns remain patent. No other mass lesion or pathologic enhancement. Underlying mild chronic microvascular ischemic disease. No other evidence for acute or chronic intracranial hemorrhage. No extra-axial collection. Vascular: Major intracranial vascular flow voids are maintained. Skull and upper cervical spine: Craniocervical junction within normal limits. Bone marrow signal intensity normal. No focal marrow replacing lesion. No scalp soft tissue abnormality. Sinuses/Orbits: Patient status post bilateral ocular lens replacement. Globes orbital soft tissues demonstrate no acute finding. Paranasal sinuses are clear. No mastoid effusion. Inner ear structures within normal limits. Other: None. IMPRESSION: 1. Stable size and appearance of hemorrhagic mass centered at the right basal ganglia. Few adjacent nodular foci of enhancement at the right caudate have an appearance more typical of small satellite lesions rather than subacute ischemia on this exam. Given this, a primary CNS neoplasm would be favored, although an intracranial hemorrhagic metastasis remains on the differential. 2. Surrounding vasogenic edema with up to 5 mm of right-to-left shift, stable. 3. Examination will be used for intraoperative guidance purposes. Electronically Signed   By: Jeannine Boga M.D.   On: 03/27/2021 01:30   MR Brain W and Wo Contrast  Result Date: 03/25/2021 CLINICAL DATA:  Metastatic disease evaluation. Headaches and balance issues for 3 weeks. Found to have intracranial hemorrhage. EXAM: MRI  HEAD WITHOUT AND WITH CONTRAST TECHNIQUE: Multiplanar, multiecho pulse sequences of the brain and surrounding structures were obtained without and  with intravenous contrast. CONTRAST:  79mL GADAVIST GADOBUTROL 1 MMOL/ML IV SOLN COMPARISON:  Same day CT head. FINDINGS: Brain: There is a solidly enhancing hemorrhagic mass centered in the right basal ganglia/frontal lobe that measures up to 3.4 x 3.8 by 2.9 cm (AP by transverse by craniocaudal). There are a few areas of intrinsic T1 hyperintensity within the lateral and medial aspect of the lesion and marked susceptibility artifact throughout the lesion. There is exuberant surrounding vasogenic edema with mass effect. There is partial effacement of the right lateral ventricle anteriorly and approximately 5 mm of leftward midline shift near the foramina [place]. No definite evidence of hydrocephalus. Basal cisterns are patent. No extra-axial fluid collection. Enhancing 6 mm focus of restricted diffusion anteromedial to the mass in the region of the caudate, likely an acute or eary subacute infarct. Additional patchy white matter T2/FLAIR hyperintensities likely represent chronic microvascular ischemic disease. No definite other enhancing lesions identified. Vascular: Major arterial flow voids are maintained at the skull base. Incidental high left parietal developmental venous anomaly. Skull and upper cervical spine: Normal marrow signal. Sinuses/Orbits: Clear sinuses.  Unremarkable orbits. Other: No mastoid effusions. IMPRESSION: 1. Large, 3.8 cm enhancing and hemorrhagic mass within the right basal ganglia/frontal lobe, which is concerning for a metastasis in this patient with a history of melanoma. High-grade glioma is a differential consideration. 2. Exuberant surrounding vasogenic edema and mass effect with partial effacement of the right lateral ventricle and approximately 5 mm of leftward midline shift. 3. Enhancing 6 mm focus of restricted diffusion anteromedial to the mass in the region of the caudate, likely an acute or eary subacute infarct. Recommend attention on follow-up to exclude an additional lesion.  4. No other enhancing lesions identified. Electronically Signed   By: Margaretha Sheffield MD   On: 03/25/2021 12:16   CT CHEST ABDOMEN PELVIS W CONTRAST  Result Date: 03/26/2021 CLINICAL DATA:  Brain mass or lesion, unknown etiology EXAM: CT CHEST, ABDOMEN, AND PELVIS WITH CONTRAST TECHNIQUE: Multidetector CT imaging of the chest, abdomen and pelvis was performed following the standard protocol during bolus administration of intravenous contrast. CONTRAST:  22mL OMNIPAQUE IOHEXOL 300 MG/ML  SOLN COMPARISON:  None. FINDINGS: CT CHEST FINDINGS Cardiovascular: Normal heart size. No significant pericardial effusion. The thoracic aorta is normal in caliber. Mild atherosclerotic plaque of the thoracic aorta. At least 2 vessel coronary artery calcifications. The main pulmonary artery is normal in caliber. No central or proximal segmental pulmonary embolus. Mediastinum/Nodes: There is an enlarged 1.6 cm left hilar lymph node. No enlarged mediastinal or axillary lymph nodes. Thyroid gland, trachea, and esophagus demonstrate no significant findings. Tiny hiatal hernia. Lungs/Pleura: There is a round 1 cm right upper lobe pulmonary nodule (5:26). There is a subsolid 1.3 x 1.2 cm left upper lobe pulmonary nodule (5:52). Associated left upper lobe bronchial wall thickening. Musculoskeletal: No chest wall abnormality. No suspicious lytic or blastic osseous lesions. No acute displaced fracture. Multilevel degenerative changes of the spine. T4 vertebral body hemangioma. CT ABDOMEN PELVIS FINDINGS Hepatobiliary: The liver is enlarged measuring up to 18 cm. Several scattered hypodense subcentimeter hepatic lesions too small to characterize. No gallstones, gallbladder wall thickening, or pericholecystic fluid. No biliary dilatation. Pancreas: No focal lesion. Normal pancreatic contour. No surrounding inflammatory changes. No main pancreatic ductal dilatation. Spleen: Normal in size without focal abnormality. Adrenals/Urinary Tract:  No left adrenal nodularity. There is a  8.1 x 5.3 by 8 cm heterogeneous right upper quadrant lesion likely arising from the right adrenal gland that is noted to abut the right hepatic lobe (3:55) as well as the inferior vena cava (3:62). The lesion leads to mass effect on the inferior vena cava (7:55). Bilateral kidneys enhance symmetrically. Subcentimeter hypodensities are too small to characterize. No hydronephrosis. No hydroureter. The urinary bladder is unremarkable. Stomach/Bowel: Stomach is within normal limits. No evidence of bowel wall thickening or dilatation. Scattered colonic diverticulosis. Appendix appears normal. Vascular/Lymphatic: No abdominal aorta or iliac aneurysm. Mild atherosclerotic plaque of the aorta and its branches. Enlarged 1.1 cm mesenteric lymph node (3:84). Other lymph nodes are prominent but not enlarged. No pelvic, or inguinal lymphadenopathy. Reproductive: Prostate is unremarkable. Other: No intraperitoneal free fluid. No intraperitoneal free gas. No organized fluid collection. Musculoskeletal: No abdominal wall hernia or abnormality. No suspicious lytic or blastic osseous lesions. No acute displaced fracture. Grade 1 anterolisthesis of L5 on S1. Bilateral L5 pars interarticularis defects. IMPRESSION: 1. A 1.3 cm subsolid left upper lobe pulmonary nodule with associated bronchial wall thickening and possible endoluminal component. 2. Left hilar lymphadenopathy. 3. A 1 cm right upper lobe solid pulmonary nodule. 4. A 8.1 x 5.3 cm right upper quadrant mass likely arising from the right adrenal gland. The mass abuts the right hepatic lobe as well as the right inferior cava leading to mass effect/narrowing of the IVC. 5. Enlarged 1.1 cm mesenteric lymph node. 6. Several scattered hypodense subcentimeter hepatic lesions too small to characterize. 7. Other imaging findings of potential clinical significance: Tiny hiatal hernia. Scattered colonic diverticulosis with no acute diverticulitis.  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 Aortic Atherosclerosis (ICD10-I70.0) including coronary artery calcifications. These results will be called to the ordering clinician or representative by the Radiologist Assistant, and communication documented in the PACS or [employer]. Electronically Signed   By: Iven Finn M.D.   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   PLAN: - CT CAP done, adrenal mass and lung nodules - MRI brainlab protocol completed - dvt ppx - dex 4q6 - surgery planned for Wednesday (once off aspirin for 7 days/available OR time) - consult to oncology given adrenal mass    LOS: 2 days     Marvis Moeller, DNP, NP-C 03/27/2021, 10:41 AM

## 2021-03-27 NOTE — Progress Notes (Signed)
Left sticky note for provider regarding home eyedrops.

## 2021-03-28 MED ORDER — ALPRAZOLAM 0.25 MG PO TABS
0.2500 mg | ORAL_TABLET | Freq: Three times a day (TID) | ORAL | Status: DC | PRN
  Administered 2021-03-28 – 2021-04-05 (×9): 0.25 mg via ORAL
  Filled 2021-03-28 (×9): qty 1

## 2021-03-28 NOTE — Progress Notes (Signed)
Neurosurgery Service Progress Note  Subjective: No acute events overnight, no new complaints   Objective: Vitals:   03/27/21 2311 03/28/21 0346 03/28/21 0720 03/28/21 1217  BP: 139/68 135/69 115/64 129/63  Pulse: (!) 53 (!) 55 (!) 46 (!) 47  Resp: 18 18 17    Temp: 98.5 F (36.9 C) 97.7 F (36.5 C) 97.7 F (36.5 C) (!) 97.5 F (36.4 C)  TempSrc: Oral Oral Oral Oral  SpO2: 95% 98% 97% 98%  Weight:      Height:        Physical Exam: AOx3, PERRL, EOMI, FS, TM, FCx4 with symmetry, SILTx4  Assessment & Plan: 75 y.o. man w/ R insular mass, CT CAP w/ lung and adrenal nodules.   -holding asa/plavix for OR this week -will see if we can get an IR guided Bx of the adrenal mass to guide treatment -SCDs/TEDs, SQH -continue IV dexamethasone  Judith Part  03/28/21 3:44 PM

## 2021-03-29 LAB — CBC
HCT: 40.6 % (ref 39.0–52.0)
Hemoglobin: 14.3 g/dL (ref 13.0–17.0)
MCH: 31.2 pg (ref 26.0–34.0)
MCHC: 35.2 g/dL (ref 30.0–36.0)
MCV: 88.6 fL (ref 80.0–100.0)
Platelets: 243 10*3/uL (ref 150–400)
RBC: 4.58 MIL/uL (ref 4.22–5.81)
RDW: 12 % (ref 11.5–15.5)
WBC: 6.7 10*3/uL (ref 4.0–10.5)
nRBC: 0 % (ref 0.0–0.2)

## 2021-03-29 LAB — PROTIME-INR
INR: 1 (ref 0.8–1.2)
Prothrombin Time: 12.8 seconds (ref 11.4–15.2)

## 2021-03-29 NOTE — Consult Note (Signed)
Chief Complaint: Adrenal mass. Request is for adrenal mass biopsy  Referring Physician(s): Dr. Minette Brine  Supervising Physician: Ruthann Cancer  Patient Status: [name] - In-pt  History of Present Illness: Shannon Horton. is a 75 y.o. male History of HTN, remote history of melanoma X 2. 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 Patient is scheduled for a craniotomy on 6.1.22.  CT Abd pelvis from 5.27.22 reads No left adrenal nodularity. There is a 8.1 x 5.3 by 8 cm heterogeneous right upper quadrant lesion likely arising from the right adrenal gland that is noted to abut the right hepatic lobe (3:55) as well as the inferior vena cava (3:62). Team  is requesting a adrenal biopsy in hopes of getting a preliminary pathology report prior to the scheduled surgery for planning purposes.  Currently without any significant complaints. Patient alert and sitting in the recliner, calm and comfortable.Wife present. Both the patient and his wife asking about timeline of procedures and next steps. Return precautions and treatment recommendations and follow-up discussed with the patient who is agreeable with the plan.  Past Medical History:  Diagnosis Date  . Allergic rhinitis    gets shots per Dr. Velora Heckler  . Allergy    sees Dr. Harold Hedge   . Asthma   . Glaucoma    sees Dr. Satira Sark   . Hypertension   . Malignant melanoma ([place])    right lower leg, diagnosed on1/21/11    Past Surgical History:  Procedure Laterality Date  . CATARACT EXTRACTION     right ey, Dr. Satira Sark   . COLONOSCOPY  12/15/2015   per Dr.Kaplan, clear, no repeats   . ESOPHAGOGASTRODUODENOSCOPY     WITH ESOPHAGEAL DILATION 2006 PER dR. kAPLAN  . REMOVAL  of melanoma  12/08/09   from right calf; per Dr. Monica Becton  . removal of melanoma from left abdomen  01/21/10     per Dr. Meredith Leeds  . SQUAMOUS CELL CARCINOMA EXCISION  2013   per Dr. Terrence Dupont, from top of scalp     Allergies: Banana  Medications: Prior to Admission medications   Medication Sig Start Date End Date Taking? Authorizing Provider  acetaminophen (TYLENOL) 500 MG tablet Take 1,000 mg by mouth every 6 (six) hours as needed for mild pain.   Yes [provider]  albuterol (PROVENTIL HFA;VENTOLIN HFA) 108 (90 Base) MCG/ACT inhaler Inhale 2 puffs into the lungs every 4 (four) hours as needed for wheezing or shortness of breath. 12/05/17  Yes Burchette, Alinda Sierras, MD  amLODipine (NORVASC) 5 MG tablet Take 1 tablet (5 mg total) by mouth daily. 09/10/20  Yes Laurey Morale, MD  aspirin 325 MG EC tablet Take 325 mg by mouth every 6 (six) hours as needed for pain (alt with ibuprofen).   Yes [provider]  aspirin 81 MG EC tablet Take 81 mg by mouth daily.   Yes [provider]  bimatoprost (LUMIGAN) 0.03 % ophthalmic solution Place 1 drop into both eyes at bedtime.   Yes [provider]  brinzolamide (AZOPT) 1 % ophthalmic suspension Place 1 drop into both eyes 2 (two) times daily.   Yes [provider]  Coenzyme Q10 200 MG capsule Take 200 mg by mouth daily. 10/22/18  Yes [provider]  EPINEPHrine 0.3 mg/0.3 mL IJ SOAJ injection Inject 0.3 mg into  the muscle See admin instructions. 08/19/19  Yes [provider]  Fluticasone Furoate (ARNUITY ELLIPTA) 200 MCG/ACT AEPB Inhale 1 puff into the lungs daily.   Yes [provider]  ibuprofen (ADVIL) 200 MG tablet Take 200-400 mg by mouth every 6 (six) hours as needed for headache.   Yes [provider]  Multiple Vitamin (MULTIVITAMIN) tablet Take 1 tablet by mouth daily. One A Day   Yes [provider]  telmisartan-hydrochlorothiazide (MICARDIS HCT) 80-25 MG tablet TAKE 1 TABLET BY MOUTH EVERY DAY 07/22/20  Yes Laurey Morale, MD  UNABLE TO FIND Allergy shots at [facility] every month   Yes [provider]     Family History  Problem Relation Age of Onset  . Hypertension Other   . Colon polyps Mother   . Hypertension Mother   . Diabetes Father   . Liver disease Maternal Uncle   . Cancer Brother        throat and lung (smoker)  . Colon cancer Neg Hx   . Stomach cancer Neg Hx   . Esophageal cancer Neg Hx   . Pancreatic cancer Neg Hx     Social History   Socioeconomic History  . Marital status: Married    Spouse name: Not on file  . Number of children: Not on file  . Years of education: Not on file  . Highest education level: Not on file  Occupational History  . Not on file  Tobacco Use  . Smoking status: Former Research scientist (life sciences)  . Smokeless tobacco: Never Used  . Tobacco comment: quit 30 years ago  Vaping Use  . Vaping Use: Never used  Substance and Sexual Activity  . Alcohol use: Yes    Alcohol/week: 5.0 - 10.0 standard drinks    Types: 5 - 10 Standard drinks or equivalent per week    Comment: 1-2 beers/day  . Drug use: No  . Sexual activity: Not Currently  Other Topics Concern  . Not on file  Social History Narrative   Right handed   One story home   Drinks caffeinew   Social Determinants of Health   Financial Resource Strain: Low Risk   . Difficulty of Paying Living Expenses: Not hard at all  Food Insecurity: No Food Insecurity  . Worried About Charity fundraiser in the Last Year: Never true  . Ran Out of Food in the Last Year: Never true  Transportation Needs: No Transportation Needs  . Lack of Transportation (Medical): No  . Lack of Transportation (Non-Medical): No  Physical Activity: Sufficiently Active  . Days of Exercise per Week: 4 days  . Minutes of Exercise per Session: 40 min  Stress: No Stress Concern Present  . Feeling of Stress : Not at all  Social Connections: Moderately Integrated  . Frequency of Communication with Friends and Family: Three times a week  . Frequency of Social  Gatherings with Friends and Family: More than three times a week  . Attends Religious Services: Never  . Active Member of Clubs or Organizations: Yes  . Attends Archivist Meetings: Never  . Marital Status: Married     Review of Systems: A 12 point ROS discussed and pertinent positives are indicated in the HPI above.  All other systems are negative.  Review of Systems  Constitutional: Negative for fever.  HENT: Negative for congestion.   Respiratory: Negative for cough and shortness of breath.   Cardiovascular: Negative for chest pain.  Gastrointestinal: Negative for abdominal pain.  Neurological: Negative for headaches.  Psychiatric/Behavioral: Negative for behavioral problems and confusion.    Vital Signs: BP 133/81 (BP Location: Right Arm)   Pulse (!) 44   Temp 97.6 F (36.4 C) (Oral)   Resp 16   Ht 5\' 8"  (1.727 m)   Wt 162 lb (73.5 kg)   SpO2 97%   BMI 24.63 kg/m   Physical Exam Vitals and nursing note reviewed.  Constitutional:      Appearance: He is well-developed.  HENT:     Head: Normocephalic.  Cardiovascular:     Rate and Rhythm: Normal rate and regular rhythm.  Pulmonary:     Effort: Pulmonary effort is normal.     Breath sounds: Normal breath sounds.  Musculoskeletal:        General: Normal range of motion.     Cervical back: Normal range of motion.  Skin:    General: Skin is dry.  Neurological:     Mental Status: He is alert and oriented to person, place, and time.     Imaging: CT Head Wo Contrast  Result Date: 03/25/2021 CLINICAL DATA:  Gait disorder. Intracranial hemorrhage is suspected. EXAM: CT HEAD WITHOUT CONTRAST TECHNIQUE: Contiguous axial images were obtained from the base of the skull through the vertex without intravenous contrast. COMPARISON:  None. FINDINGS: Brain: Large irregular focus of hyperdensity centered in the right basal ganglia with a large amount of vasogenic edema. Findings are compatible with intracranial  hemorrhage. The irregular hemorrhagic lesion measures 4.1 x 2.8 x 3.4 cm. Hemorrhage extends down to the right temporal lobe region. There is 6 mm right to left midline shift. There is mild effacement of the right lateral ventricle. No significant intraventricular hemorrhage. No hydrocephalus. Vascular: No hyperdense vessel or unexpected calcification. Skull: Normal. Negative for fracture or focal lesion. Sinuses/Orbits: No acute finding. Other: None. IMPRESSION: 1. Large irregular intracranial hemorrhage centered in the right basal ganglia with a large amount of surrounding vasogenic edema. The morphology and vasogenic edema raises concern for an underlying hemorrhagic lesion. Recommend further characterization with MRI, with and without contrast, to evaluate for a neoplastic process. 2. 6 mm of midline shift. Critical Value/emergent results were called by telephone at the time of interpretation on 03/25/2021 at 8:48 am to provider [facility] , who verbally acknowledged these results. Electronically Signed   By: Markus Daft M.D.   On: 03/25/2021 08:54   MR BRAIN W WO CONTRAST  Result Date: 03/27/2021 CLINICAL DATA:  Follow-up examination for brain mass. EXAM: MRI HEAD WITHOUT AND WITH CONTRAST TECHNIQUE: Multiplanar, multiecho pulse sequences of the brain and surrounding structures were obtained without and with intravenous contrast. CONTRAST:  7.64mL GADAVIST GADOBUTROL 1 MMOL/ML IV SOLN COMPARISON:  Recent brain MRI from 03/25/2021. FINDINGS: Brain: Previously identified hemorrhagic mass centered at the right basal ganglia again seen, not significantly changed measuring 3.3 x 3.2 x 2.6 cm on this exam. Scattered areas of intrinsic T1 hyperintensity with susceptibility artifact consistent with hemorrhage. Few subcentimeter nodular foci of enhancement seen involving the adjacent right caudate and basal ganglia favored to reflect small satellite lesions rather than subacute ischemia (series 9, images 128, 122).  The primary mass both involves and splays the surrounding radiating white matter tracts. Prominent surrounding T2/FLAIR signal abnormality throughout the adjacent right frontotemporal region. Mass effect on the adjacent right lateral ventricle which is partially effaced. Associated right-to-left shift measures up to 5 mm. No hydrocephalus or trapping. Basilar cisterns remain patent. No other  mass lesion or pathologic enhancement. Underlying mild chronic microvascular ischemic disease. No other evidence for acute or chronic intracranial hemorrhage. No extra-axial collection. Vascular: Major intracranial vascular flow voids are maintained. Skull and upper cervical spine: Craniocervical junction within normal limits. Bone marrow signal intensity normal. No focal marrow replacing lesion. No scalp soft tissue abnormality. Sinuses/Orbits: Patient status post bilateral ocular lens replacement. Globes orbital soft tissues demonstrate no acute finding. Paranasal sinuses are clear. No mastoid effusion. Inner ear structures within normal limits. Other: None. IMPRESSION: 1. Stable size and appearance of hemorrhagic mass centered at the right basal ganglia. Few adjacent nodular foci of enhancement at the right caudate have an appearance more typical of small satellite lesions rather than subacute ischemia on this exam. Given this, a primary CNS neoplasm would be favored, although an intracranial hemorrhagic metastasis remains on the differential. 2. Surrounding vasogenic edema with up to 5 mm of right-to-left shift, stable. 3. Examination will be used for intraoperative guidance purposes. Electronically Signed   By: Jeannine Boga M.D.   On: 03/27/2021 01:30   MR Brain W and Wo Contrast  Result Date: 03/25/2021 CLINICAL DATA:  Metastatic disease evaluation. Headaches and balance issues for 3 weeks. Found to have intracranial hemorrhage. EXAM: MRI HEAD WITHOUT AND WITH CONTRAST TECHNIQUE: Multiplanar, multiecho pulse  sequences of the brain and surrounding structures were obtained without and with intravenous contrast. CONTRAST:  62mL GADAVIST GADOBUTROL 1 MMOL/ML IV SOLN COMPARISON:  Same day CT head. FINDINGS: Brain: There is a solidly enhancing hemorrhagic mass centered in the right basal ganglia/frontal lobe that measures up to 3.4 x 3.8 by 2.9 cm (AP by transverse by craniocaudal). There are a few areas of intrinsic T1 hyperintensity within the lateral and medial aspect of the lesion and marked susceptibility artifact throughout the lesion. There is exuberant surrounding vasogenic edema with mass effect. There is partial effacement of the right lateral ventricle anteriorly and approximately 5 mm of leftward midline shift near the foramina [place]. No definite evidence of hydrocephalus. Basal cisterns are patent. No extra-axial fluid collection. Enhancing 6 mm focus of restricted diffusion anteromedial to the mass in the region of the caudate, likely an acute or eary subacute infarct. Additional patchy white matter T2/FLAIR hyperintensities likely represent chronic microvascular ischemic disease. No definite other enhancing lesions identified. Vascular: Major arterial flow voids are maintained at the skull base. Incidental high left parietal developmental venous anomaly. Skull and upper cervical spine: Normal marrow signal. Sinuses/Orbits: Clear sinuses.  Unremarkable orbits. Other: No mastoid effusions. IMPRESSION: 1. Large, 3.8 cm enhancing and hemorrhagic mass within the right basal ganglia/frontal lobe, which is concerning for a metastasis in this patient with a history of melanoma. High-grade glioma is a differential consideration. 2. Exuberant surrounding vasogenic edema and mass effect with partial effacement of the right lateral ventricle and approximately 5 mm of leftward midline shift. 3. Enhancing 6 mm focus of restricted diffusion anteromedial to the mass in the region of the caudate, likely an acute or eary  subacute infarct. Recommend attention on follow-up to exclude an additional lesion. 4. No other enhancing lesions identified. Electronically Signed   By: Margaretha Sheffield MD   On: 03/25/2021 12:16   CT CHEST ABDOMEN PELVIS W CONTRAST  Result Date: 03/26/2021 CLINICAL DATA:  Brain mass or lesion, unknown etiology EXAM: CT CHEST, ABDOMEN, AND PELVIS WITH CONTRAST TECHNIQUE: Multidetector CT imaging of the chest, abdomen and pelvis was performed following the standard protocol during bolus administration of intravenous contrast. CONTRAST:  41mL OMNIPAQUE  IOHEXOL 300 MG/ML  SOLN COMPARISON:  None. FINDINGS: CT CHEST FINDINGS Cardiovascular: Normal heart size. No significant pericardial effusion. The thoracic aorta is normal in caliber. Mild atherosclerotic plaque of the thoracic aorta. At least 2 vessel coronary artery calcifications. The main pulmonary artery is normal in caliber. No central or proximal segmental pulmonary embolus. Mediastinum/Nodes: There is an enlarged 1.6 cm left hilar lymph node. No enlarged mediastinal or axillary lymph nodes. Thyroid gland, trachea, and esophagus demonstrate no significant findings. Tiny hiatal hernia. Lungs/Pleura: There is a round 1 cm right upper lobe pulmonary nodule (5:26). There is a subsolid 1.3 x 1.2 cm left upper lobe pulmonary nodule (5:52). Associated left upper lobe bronchial wall thickening. Musculoskeletal: No chest wall abnormality. No suspicious lytic or blastic osseous lesions. No acute displaced fracture. Multilevel degenerative changes of the spine. T4 vertebral body hemangioma. CT ABDOMEN PELVIS FINDINGS Hepatobiliary: The liver is enlarged measuring up to 18 cm. Several scattered hypodense subcentimeter hepatic lesions too small to characterize. No gallstones, gallbladder wall thickening, or pericholecystic fluid. No biliary dilatation. Pancreas: No focal lesion. Normal pancreatic contour. No surrounding inflammatory changes. No main pancreatic ductal  dilatation. Spleen: Normal in size without focal abnormality. Adrenals/Urinary Tract: No left adrenal nodularity. There is a 8.1 x 5.3 by 8 cm heterogeneous right upper quadrant lesion likely arising from the right adrenal gland that is noted to abut the right hepatic lobe (3:55) as well as the inferior vena cava (3:62). The lesion leads to mass effect on the inferior vena cava (7:55). Bilateral kidneys enhance symmetrically. Subcentimeter hypodensities are too small to characterize. No hydronephrosis. No hydroureter. The urinary bladder is unremarkable. Stomach/Bowel: Stomach is within normal limits. No evidence of bowel wall thickening or dilatation. Scattered colonic diverticulosis. Appendix appears normal. Vascular/Lymphatic: No abdominal aorta or iliac aneurysm. Mild atherosclerotic plaque of the aorta and its branches. Enlarged 1.1 cm mesenteric lymph node (3:84). Other lymph nodes are prominent but not enlarged. No pelvic, or inguinal lymphadenopathy. Reproductive: Prostate is unremarkable. Other: No intraperitoneal free fluid. No intraperitoneal free gas. No organized fluid collection. Musculoskeletal: No abdominal wall hernia or abnormality. No suspicious lytic or blastic osseous lesions. No acute displaced fracture. Grade 1 anterolisthesis of L5 on S1. Bilateral L5 pars interarticularis defects. IMPRESSION: 1. A 1.3 cm subsolid left upper lobe pulmonary nodule with associated bronchial wall thickening and possible endoluminal component. 2. Left hilar lymphadenopathy. 3. A 1 cm right upper lobe solid pulmonary nodule. 4. A 8.1 x 5.3 cm right upper quadrant mass likely arising from the right adrenal gland. The mass abuts the right hepatic lobe as well as the right inferior cava leading to mass effect/narrowing of the IVC. 5. Enlarged 1.1 cm mesenteric lymph node. 6. Several scattered hypodense subcentimeter hepatic lesions too small to characterize. 7. Other imaging findings of potential clinical  significance: Tiny hiatal hernia. Scattered colonic diverticulosis with no acute diverticulitis. 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 Aortic Atherosclerosis (ICD10-I70.0) including coronary artery calcifications. These results will be called to the ordering clinician or representative by the Radiologist Assistant, and communication documented in the PACS or [employer]. Electronically Signed   By: Iven Finn M.D.   On: 03/26/2021 00:30   DG Chest Portable 1 View  Result Date: 03/25/2021 CLINICAL DATA:  Unsteadiness, headaches, imbalance, intracranial hemorrhage EXAM: PORTABLE CHEST 1 VIEW COMPARISON:  Portable exam 1005 hours compared to 03/11/2013 FINDINGS: Normal heart size, mediastinal contours, and pulmonary vascularity. Atherosclerotic calcification aorta. Lungs clear. No  pulmonary infiltrate, pleural effusion or pneumothorax. Probable small enchondroma proximal LEFT humerus. Osseous demineralization. IMPRESSION: No acute abnormalities. Aortic Atherosclerosis (ICD10-I70.0). Electronically Signed   By: Lavonia Dana M.D.   On: 03/25/2021 10:25    Labs:  CBC: Recent Labs    09/10/20 1045 02/17/21 1108 03/25/21 1003 03/25/21 1707  WBC 5.1 6.6 6.4 6.1  HGB 15.9 15.1 14.5 14.8  HCT 46.6 43.8 42.4 43.6  PLT 211 208.0 245 237    COAGS: No results for input(s): INR, APTT in the last 8760 hours.  BMP: Recent Labs    09/10/20 1045 02/17/21 1108 03/25/21 1003 03/25/21 1707  NA 136 135 135  --   K 4.5 3.8 3.7  --   CL 98 95* 98  --   CO2 29 30 25   --   GLUCOSE 107* 104* 126*  --   BUN 11 8 9   --   CALCIUM 10.1 9.8 9.3  --   CREATININE 0.82 0.78 0.64 0.67  GFRNONAA 87  --  >60 >60  GFRAA 101  --   --   --     LIVER FUNCTION TESTS: Recent Labs    09/10/20 1045 02/17/21 1108 03/25/21 1003  BILITOT 0.8 0.8 0.7  AST 22 17 25   ALT 16 13 21   ALKPHOS  --  77 63  PROT 6.6 6.5 6.0*  ALBUMIN  --  4.2 3.8     Assessment  and Plan:  75 y.o. male inpatient. History of HTN, remote history of melanoma X 2. 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 Patient is scheduled for a craniotomy on 6.1.22.  CT Abd pelvis from 5.27.22 reads No left adrenal nodularity. There is a 8.1 x 5.3 by 8 cm heterogeneous right upper quadrant lesion likely arising from the right adrenal gland that is noted to abut the right hepatic lobe (3:55) as well as the inferior vena cava (3:62). Team  is requesting a adrenal biopsy in hopes of getting a preliminary pathology report prior to the scheduled surgery for planning purposes.   All labs are within acceptable parameters. Patient is on heparin prophylactic dose. No pertinent allergies.   IR consulted for possible adrenal mass biopsy. Case has been reviewed and procedure approved by Dr. Serafina Royals.  Patient tentatively scheduled for 5.30.22 under CT.  Team instructed to: Keep Patient to be NPO after midnight Hold prophylactic anticoagulation the day of the scheduled procedure. Team made aware that procedure cannot be performed until 5.31.22 due availability of pathology so prelimary pathology results may not be in prior to scheduled surgery on 6.1.22 IR will call patient when ready.  Risks and benefits of adrenal mass biopsy was discussed with the patient and/or patient's family including, but not limited to bleeding, infection, damage to adjacent structures or low yield requiring additional tests.  All of the questions were answered and there is agreement to proceed.  Consent signed and in chart.  Thank you for this interesting consult.  I greatly enjoyed meeting Shannon Horton. and look forward to participating in their care.  A copy of this report was sent to the requesting provider on this date.  Electronically Signed: Jacqualine Mau, NP 03/29/2021, 8:40 AM   I spent a total of 40 Minutes    in face to face in clinical consultation, greater than 50% of which  was counseling/coordinating care for adrenal mass biopsy

## 2021-03-29 NOTE — Plan of Care (Signed)

## 2021-03-30 ENCOUNTER — Encounter (HOSPITAL_COMMUNITY): Payer: Self-pay | Admitting: Neurological Surgery

## 2021-03-30 ENCOUNTER — Other Ambulatory Visit (HOSPITAL_COMMUNITY): Payer: Medicare PPO

## 2021-03-30 ENCOUNTER — Inpatient Hospital Stay (HOSPITAL_COMMUNITY): Admission: RE | Admit: 2021-03-30 | Payer: Medicare PPO | Source: Ambulatory Visit

## 2021-03-30 ENCOUNTER — Inpatient Hospital Stay (HOSPITAL_COMMUNITY): Payer: Medicare PPO

## 2021-03-30 DIAGNOSIS — R911 Solitary pulmonary nodule: Secondary | ICD-10-CM

## 2021-03-30 DIAGNOSIS — E279 Disorder of adrenal gland, unspecified: Secondary | ICD-10-CM

## 2021-03-30 LAB — BASIC METABOLIC PANEL
Anion gap: 10 (ref 5–15)
BUN: 17 mg/dL (ref 8–23)
CO2: 25 mmol/L (ref 22–32)
Calcium: 9 mg/dL (ref 8.9–10.3)
Chloride: 94 mmol/L — ABNORMAL LOW (ref 98–111)
Creatinine, Ser: 0.72 mg/dL (ref 0.61–1.24)
GFR, Estimated: >60 mL/min (ref >60)
Glucose, Bld: 118 mg/dL — ABNORMAL HIGH (ref 70–99)
Potassium: 3.6 mmol/L (ref 3.5–5.1)
Sodium: 129 mmol/L — ABNORMAL LOW (ref 135–145)

## 2021-03-30 LAB — APTT: aPTT: 20 seconds — ABNORMAL LOW (ref 24–36)

## 2021-03-30 LAB — CBC
HCT: 40.8 % (ref 39.0–52.0)
Hemoglobin: 14.7 g/dL (ref 13.0–17.0)
MCH: 31.6 pg (ref 26.0–34.0)
MCHC: 36 g/dL (ref 30.0–36.0)
MCV: 87.7 fL (ref 80.0–100.0)
Platelets: 250 10*3/uL (ref 150–400)
RBC: 4.65 MIL/uL (ref 4.22–5.81)
RDW: 11.9 % (ref 11.5–15.5)
WBC: 7.7 10*3/uL (ref 4.0–10.5)
nRBC: 0 % (ref 0.0–0.2)

## 2021-03-30 MED ORDER — MIDAZOLAM HCL 2 MG/2ML IJ SOLN
INTRAMUSCULAR | Status: AC | PRN
  Administered 2021-03-30: 1 mg via INTRAVENOUS

## 2021-03-30 MED ORDER — LIDOCAINE HCL 1 % IJ SOLN
INTRAMUSCULAR | Status: AC
  Filled 2021-03-30: qty 20

## 2021-03-30 MED ORDER — FENTANYL CITRATE (PF) 100 MCG/2ML IJ SOLN
INTRAMUSCULAR | Status: AC
  Filled 2021-03-30: qty 2

## 2021-03-30 MED ORDER — PHENTOLAMINE MESYLATE 5 MG IJ SOLR
10.0000 mg | Freq: Once | INTRAMUSCULAR | Status: DC | PRN
  Filled 2021-03-30: qty 10

## 2021-03-30 MED ORDER — STERILE WATER FOR INJECTION IJ SOLN
INTRAMUSCULAR | Status: AC
  Filled 2021-03-30: qty 10

## 2021-03-30 MED ORDER — FENTANYL CITRATE (PF) 100 MCG/2ML IJ SOLN
INTRAMUSCULAR | Status: AC | PRN
  Administered 2021-03-30: 25 ug via INTRAVENOUS

## 2021-03-30 MED ORDER — MIDAZOLAM HCL 2 MG/2ML IJ SOLN
INTRAMUSCULAR | Status: AC
  Filled 2021-03-30: qty 2

## 2021-03-30 MED ORDER — HEPARIN SODIUM (PORCINE) 5000 UNIT/ML IJ SOLN: 5000.0000 [IU] | Freq: Three times a day (TID) | INTRAMUSCULAR | Status: DC

## 2021-03-30 MED ORDER — CEFAZOLIN SODIUM-DEXTROSE 2-4 GM/100ML-% IV SOLN
2.0000 g | INTRAVENOUS | Status: AC
  Administered 2021-03-31: 2 g via INTRAVENOUS
  Filled 2021-03-30: qty 100

## 2021-03-30 NOTE — Consult Note (Addendum)
[place]  [phone] [phone]   [place]  Referral MD: Dr. Pieter Partridge Dawley  Reason for Referral: Adrenal mass, left upper lobe pulmonary nodule, left hilar lymphadenopathy, hemorrhagic mass in the right basal ganglia  HPI: Mr. Mabile is a 75 year old male with a past medical history significant for melanoma of the right lower leg diagnosed in January 2011, hypertension, asthma, allergic rhinitis, glaucoma.  The patient presented to the hospital with weakness and being off balance.  He has a 4 to 6-week history of intermittent generalized weakness and feeling off balance.  He has not had any recent seizures, numbness, tingling, or focal weakness.  CT of the head without contrast was performed 03/25/2021 which showed a large irregular intracranial hemorrhage centered in the right basal ganglia with a large amount of surrounding vasogenic edema, 6 mm midline shift.  This was followed by an MRI of the brain with and without contrast performed 03/25/2021 which showed a large 3.8 cm enhancing and hemorrhagic mass in the right basal ganglia/frontal lobe concerning for metastasis versus high-grade glioma, exuberant surrounding vasogenic edema and mass-effect with partial effacement of the right lateral ventricle and approximately 5 mm leftward midline shift.  The patient has been started on dexamethasone. CT of the chest/abdomen/pelvis was performed 03/26/2021 which showed a 1.3 cm of solid left upper lobe pulmonary nodule with associated bronchial wall thickening and possible endoluminal component, left hilar lymphadenopathy, 1 cm right upper lobe solid pulmonary nodule, 8.1 x 5.3 cm right upper quadrant mass likely arising from the right adrenal gland-mass abuts the right hepatic lobe as well as the right inferior vena cava leading to mass-effect/narrowing of the IVC, enlarged 1.1 cm mesenteric lymph node, scattered subcentimeter hepatic lesions too  small to characterize.  The patient underwent a right adrenal mass biopsy earlier today by IR.  Pathology pending.  Neurosurgery following and is tentatively planning for right frontal craniotomy tomorrow depending on pathology results.  Patient was seen today in his hospital room.  He just returned from his renal mass biopsy.  His wife is at the bedside.  Patient reports that his biggest issue is that he has been off balance, having dizziness, and has fallen over the past 6 weeks.  He has also been having headaches.  He reports a decreased appetite and weight loss of approximately 10 pounds since November 2021.  He reports that his headaches have improved.  He denies recent fevers, chills, chest pain, shortness of breath, cough, abdominal pain, nausea, vomiting, constipation, diarrhea, bleeding.  He has a personal history of melanoma with a lesion removed from his right calf in approximately 2011 and another lesion removed from his left abdomen. He states that a sentinel lymph node biopsy was taken from his right groin and left axilla at the time the lesions were removed.  Lymph nodes negative for malignancy.  The patient is married and has 3 children.  Remote history of tobacco use and quit 30 years ago.  The patient was drinking 1-2 beers a day but stopped when his symptoms started. Up until recently, he was able to ride his bike on a regular basis.  Family history significant for a brother with throat/lung cancer, mother with squamous cell carcinoma of the skin, and father with skin cancer.  Medical oncology was asked see the patient make recommendations regarding his renal mass, pulmonary lesions, and brain tumor.   Past Medical History:  Diagnosis Date  . Allergic rhinitis    gets  shots per Dr. Velora Heckler  . Allergy    sees Dr. Harold Hedge   . Asthma   . Glaucoma    sees Dr. Satira Sark   . Hypertension   . Malignant melanoma ([place])    right lower leg, diagnosed on1/21/11  :  Past Surgical History:   Procedure Laterality Date  . CATARACT EXTRACTION     right ey, Dr. Satira Sark   . COLONOSCOPY  12/15/2015   per Dr.Kaplan, clear, no repeats   . ESOPHAGOGASTRODUODENOSCOPY     WITH ESOPHAGEAL DILATION 2006 PER dR. kAPLAN  . REMOVAL  of melanoma  12/08/09   from right calf; per Dr. Monica Becton  . removal of melanoma from left abdomen  01/21/10    per Dr. Meredith Leeds  . SQUAMOUS CELL CARCINOMA EXCISION  2013   per Dr. Terrence Dupont, from top of scalp   :  Current Facility-Administered Medications  Medication Dose Route Frequency Provider Last Rate Last Admin  . acetaminophen (TYLENOL) tablet 650 mg  650 mg Oral Q6H PRN Dawley, Troy C, DO   650 mg at 03/26/21 1525   Or  . acetaminophen (TYLENOL) suppository 650 mg  650 mg Rectal Q6H PRN Dawley, Troy C, DO      . albuterol (PROVENTIL) (2.5 MG/3ML) 0.083% nebulizer solution 3 mL  3 mL Inhalation Q4H PRN Dawley, Troy C, DO      . ALPRAZolam Duanne Moron) tablet 0.25 mg  0.25 mg Oral TID PRN Judith Part, MD   0.25 mg at 03/29/21 2208  . amLODipine (NORVASC) tablet 5 mg  5 mg Oral Daily Dawley, Troy C, DO   5 mg at 03/30/21 0920  . brinzolamide (AZOPT) 1 % ophthalmic suspension 1 drop  1 drop Both Eyes BID Fenton Malling L, NP   1 drop at 03/30/21 7341  . dexamethasone (DECADRON) injection 4 mg  4 mg Intravenous Q6H Dawley, Troy C, DO   4 mg at 03/30/21 0607  . docusate sodium (COLACE) capsule 100 mg  100 mg Oral BID Dawley, Troy C, DO   100 mg at 03/30/21 0920  . famotidine (PEPCID) tablet 20 mg  20 mg Oral Daily Dawley, Troy C, DO   20 mg at 03/30/21 0920  . irbesartan (AVAPRO) tablet 300 mg  300 mg Oral Daily Dawley, Troy C, DO   300 mg at 03/30/21 0920   And  . hydrochlorothiazide (HYDRODIURIL) tablet 25 mg  25 mg Oral Daily Dawley, Troy C, DO   25 mg at 03/29/21 9379  . HYDROcodone-acetaminophen (NORCO/VICODIN) 5-325 MG per tablet 1-2 tablet  1-2 tablet Oral Q4H PRN Dawley, Troy C, DO      . latanoprost (XALATAN) 0.005 % ophthalmic solution 1  drop  1 drop Both Eyes QHS Dawley, Troy C, DO   1 drop at 03/29/21 2208  . lidocaine (XYLOCAINE) 1 % (with pres) injection           . morphine 2 MG/ML injection 2 mg  2 mg Intravenous Q4H PRN Dawley, Troy C, DO      . ondansetron (ZOFRAN) tablet 4 mg  4 mg Oral Q6H PRN Dawley, Troy C, DO       Or  . ondansetron (ZOFRAN) injection 4 mg  4 mg Intravenous Q6H PRN Dawley, Troy C, DO      . phentolamine (REGITINE) injection 10 mg  10 mg Intravenous Once PRN Mir, Paula Libra, MD         Allergies  Allergen Reactions  . Banana Rash  :  Family History  Problem Relation Age of Onset  . Hypertension Other   . Colon polyps Mother   . Hypertension Mother   . Diabetes Father   . Liver disease Maternal Uncle   . Cancer Brother        throat and lung (smoker)  . Colon cancer Neg Hx   . Stomach cancer Neg Hx   . Esophageal cancer Neg Hx   . Pancreatic cancer Neg Hx   :  Social History   Socioeconomic History  . Marital status: Married    Spouse name: Not on file  . Number of children: Not on file  . Years of education: Not on file  . Highest education level: Not on file  Occupational History  . Not on file  Tobacco Use  . Smoking status: Former Research scientist (life sciences)  . Smokeless tobacco: Never Used  . Tobacco comment: quit 30 years ago  Vaping Use  . Vaping Use: Never used  Substance and Sexual Activity  . Alcohol use: Yes    Alcohol/week: 5.0 - 10.0 standard drinks    Types: 5 - 10 Standard drinks or equivalent per week    Comment: 1-2 beers/day  . Drug use: No  . Sexual activity: Not Currently  Other Topics Concern  . Not on file  Social History Narrative   Right handed   One story home   Drinks caffeinew   Social Determinants of Health   Financial Resource Strain: Low Risk   . Difficulty of Paying Living Expenses: Not hard at all  Food Insecurity: No Food Insecurity  . Worried About Charity fundraiser in the Last Year: Never true  . Ran Out of Food in the Last Year: Never true   Transportation Needs: No Transportation Needs  . Lack of Transportation (Medical): No  . Lack of Transportation (Non-Medical): No  Physical Activity: Sufficiently Active  . Days of Exercise per Week: 4 days  . Minutes of Exercise per Session: 40 min  Stress: No Stress Concern Present  . Feeling of Stress : Not at all  Social Connections: Moderately Integrated  . Frequency of Communication with Friends and Family: Three times a week  . Frequency of Social Gatherings with Friends and Family: More than three times a week  . Attends Religious Services: Never  . Active Member of Clubs or Organizations: Yes  . Attends Archivist Meetings: Never  . Marital Status: Married  Human resources officer Violence: Not At Risk  . Fear of Current or Ex-Partner: No  . Emotionally Abused: No  . Physically Abused: No  . Sexually Abused: No  :  Review of Systems: A comprehensive 14 point review of systems was negative except as noted in the HPI.  Exam: Patient Vitals for the past 24 hrs:  BP Temp Temp src Pulse Resp SpO2  03/30/21 1136 126/73 -- -- (!) 51 -- 97 %  03/30/21 1114 124/71 97.7 F (36.5 C) Oral (!) 50 15 96 %  03/30/21 1050 134/83 -- -- 62 16 98 %  03/30/21 1040 130/79 -- -- 67 14 98 %  03/30/21 1035 119/69 -- -- 64 15 97 %  03/30/21 1030 115/78 -- -- 64 16 97 %  03/30/21 1025 119/70 -- -- 62 16 99 %  03/30/21 0948 (!) 151/74 -- -- (!) 57 16 99 %  03/30/21 0916 119/67 -- -- (!) 47 -- --  03/30/21 0750 137/68 97.7 F (36.5 C) Oral (!) 44 18 98 %  03/30/21 0413  127/71 97.7 F (36.5 C) Oral (!) 47 18 98 %  03/29/21 2357 122/74 98.1 F (36.7 C) Oral (!) 49 18 97 %  03/29/21 2029 123/75 97.6 F (36.4 C) Oral (!) 54 18 96 %  03/29/21 1554 109/68 97.8 F (36.6 C) Oral (!) 51 18 95 %    General: Awake and alert, no distress, laying in bed. Eyes:  no scleral icterus.   ENT:  There were no oropharyngeal lesions.   Lymphatics:  Negative cervical, supraclavicular, axillary,  inguinal adenopathy.   Respiratory: lungs were clear bilaterally without wheezing or crackles.   Cardiovascular:  Regular rate and rhythm, S1/S2, without murmur, rub or gallop.  There was no pedal edema.   GI:  abdomen was soft, flat, nontender, nondistended, without organomegaly.   Musculoskeletal: Strength symmetrical in the upper and lower extremities. Skin exam was without echymosis, petichae.   Neuro exam was nonfocal. Patient was alert and oriented.  Attention was good.   Language was appropriate.  Mood was normal without depression.  Speech was not pressured.  Thought content was not tangential.     Lab Results  Component Value Date   WBC 6.7 03/29/2021   HGB 14.3 03/29/2021   HCT 40.6 03/29/2021   PLT 243 03/29/2021   GLUCOSE 126 (H) 03/25/2021   CHOL 187 09/10/2020   TRIG 64 09/10/2020   HDL 55 09/10/2020   LDLDIRECT 157.9 09/20/2011   LDLCALC 116 (H) 09/10/2020   ALT 21 03/25/2021   AST 25 03/25/2021   NA 135 03/25/2021   K 3.7 03/25/2021   CL 98 03/25/2021   CREATININE 0.67 03/25/2021   BUN 9 03/25/2021   CO2 25 03/25/2021    CT Head Wo Contrast  Result Date: 03/25/2021 CLINICAL DATA:  Gait disorder. Intracranial hemorrhage is suspected. EXAM: CT HEAD WITHOUT CONTRAST TECHNIQUE: Contiguous axial images were obtained from the base of the skull through the vertex without intravenous contrast. COMPARISON:  None. FINDINGS: Brain: Large irregular focus of hyperdensity centered in the right basal ganglia with a large amount of vasogenic edema. Findings are compatible with intracranial hemorrhage. The irregular hemorrhagic lesion measures 4.1 x 2.8 x 3.4 cm. Hemorrhage extends down to the right temporal lobe region. There is 6 mm right to left midline shift. There is mild effacement of the right lateral ventricle. No significant intraventricular hemorrhage. No hydrocephalus. Vascular: No hyperdense vessel or unexpected calcification. Skull: Normal. Negative for fracture or focal  lesion. Sinuses/Orbits: No acute finding. Other: None. IMPRESSION: 1. Large irregular intracranial hemorrhage centered in the right basal ganglia with a large amount of surrounding vasogenic edema. The morphology and vasogenic edema raises concern for an underlying hemorrhagic lesion. Recommend further characterization with MRI, with and without contrast, to evaluate for a neoplastic process. 2. 6 mm of midline shift. Critical Value/emergent results were called by telephone at the time of interpretation on 03/25/2021 at 8:48 am to provider [facility] , who verbally acknowledged these results. Electronically Signed   By: Markus Daft M.D.   On: 03/25/2021 08:54   MR BRAIN W WO CONTRAST  Result Date: 03/27/2021 CLINICAL DATA:  Follow-up examination for brain mass. EXAM: MRI HEAD WITHOUT AND WITH CONTRAST TECHNIQUE: Multiplanar, multiecho pulse sequences of the brain and surrounding structures were obtained without and with intravenous contrast. CONTRAST:  7.57mL GADAVIST GADOBUTROL 1 MMOL/ML IV SOLN COMPARISON:  Recent brain MRI from 03/25/2021. FINDINGS: Brain: Previously identified hemorrhagic mass centered at the right basal ganglia again seen, not significantly changed measuring 3.3  x 3.2 x 2.6 cm on this exam. Scattered areas of intrinsic T1 hyperintensity with susceptibility artifact consistent with hemorrhage. Few subcentimeter nodular foci of enhancement seen involving the adjacent right caudate and basal ganglia favored to reflect small satellite lesions rather than subacute ischemia (series 9, images 128, 122). The primary mass both involves and splays the surrounding radiating white matter tracts. Prominent surrounding T2/FLAIR signal abnormality throughout the adjacent right frontotemporal region. Mass effect on the adjacent right lateral ventricle which is partially effaced. Associated right-to-left shift measures up to 5 mm. No hydrocephalus or trapping. Basilar cisterns remain patent. No other mass  lesion or pathologic enhancement. Underlying mild chronic microvascular ischemic disease. No other evidence for acute or chronic intracranial hemorrhage. No extra-axial collection. Vascular: Major intracranial vascular flow voids are maintained. Skull and upper cervical spine: Craniocervical junction within normal limits. Bone marrow signal intensity normal. No focal marrow replacing lesion. No scalp soft tissue abnormality. Sinuses/Orbits: Patient status post bilateral ocular lens replacement. Globes orbital soft tissues demonstrate no acute finding. Paranasal sinuses are clear. No mastoid effusion. Inner ear structures within normal limits. Other: None. IMPRESSION: 1. Stable size and appearance of hemorrhagic mass centered at the right basal ganglia. Few adjacent nodular foci of enhancement at the right caudate have an appearance more typical of small satellite lesions rather than subacute ischemia on this exam. Given this, a primary CNS neoplasm would be favored, although an intracranial hemorrhagic metastasis remains on the differential. 2. Surrounding vasogenic edema with up to 5 mm of right-to-left shift, stable. 3. Examination will be used for intraoperative guidance purposes. Electronically Signed   By: Jeannine Boga M.D.   On: 03/27/2021 01:30   MR Brain W and Wo Contrast  Result Date: 03/25/2021 CLINICAL DATA:  Metastatic disease evaluation. Headaches and balance issues for 3 weeks. Found to have intracranial hemorrhage. EXAM: MRI HEAD WITHOUT AND WITH CONTRAST TECHNIQUE: Multiplanar, multiecho pulse sequences of the brain and surrounding structures were obtained without and with intravenous contrast. CONTRAST:  19mL GADAVIST GADOBUTROL 1 MMOL/ML IV SOLN COMPARISON:  Same day CT head. FINDINGS: Brain: There is a solidly enhancing hemorrhagic mass centered in the right basal ganglia/frontal lobe that measures up to 3.4 x 3.8 by 2.9 cm (AP by transverse by craniocaudal). There are a few areas of  intrinsic T1 hyperintensity within the lateral and medial aspect of the lesion and marked susceptibility artifact throughout the lesion. There is exuberant surrounding vasogenic edema with mass effect. There is partial effacement of the right lateral ventricle anteriorly and approximately 5 mm of leftward midline shift near the foramina [place]. No definite evidence of hydrocephalus. Basal cisterns are patent. No extra-axial fluid collection. Enhancing 6 mm focus of restricted diffusion anteromedial to the mass in the region of the caudate, likely an acute or eary subacute infarct. Additional patchy white matter T2/FLAIR hyperintensities likely represent chronic microvascular ischemic disease. No definite other enhancing lesions identified. Vascular: Major arterial flow voids are maintained at the skull base. Incidental high left parietal developmental venous anomaly. Skull and upper cervical spine: Normal marrow signal. Sinuses/Orbits: Clear sinuses.  Unremarkable orbits. Other: No mastoid effusions. IMPRESSION: 1. Large, 3.8 cm enhancing and hemorrhagic mass within the right basal ganglia/frontal lobe, which is concerning for a metastasis in this patient with a history of melanoma. High-grade glioma is a differential consideration. 2. Exuberant surrounding vasogenic edema and mass effect with partial effacement of the right lateral ventricle and approximately 5 mm of leftward midline shift. 3. Enhancing 6 mm focus  of restricted diffusion anteromedial to the mass in the region of the caudate, likely an acute or eary subacute infarct. Recommend attention on follow-up to exclude an additional lesion. 4. No other enhancing lesions identified. Electronically Signed   By: Margaretha Sheffield MD   On: 03/25/2021 12:16   CT CHEST ABDOMEN PELVIS W CONTRAST  Result Date: 03/26/2021 CLINICAL DATA:  Brain mass or lesion, unknown etiology EXAM: CT CHEST, ABDOMEN, AND PELVIS WITH CONTRAST TECHNIQUE: Multidetector CT imaging of  the chest, abdomen and pelvis was performed following the standard protocol during bolus administration of intravenous contrast. CONTRAST:  60mL OMNIPAQUE IOHEXOL 300 MG/ML  SOLN COMPARISON:  None. FINDINGS: CT CHEST FINDINGS Cardiovascular: Normal heart size. No significant pericardial effusion. The thoracic aorta is normal in caliber. Mild atherosclerotic plaque of the thoracic aorta. At least 2 vessel coronary artery calcifications. The main pulmonary artery is normal in caliber. No central or proximal segmental pulmonary embolus. Mediastinum/Nodes: There is an enlarged 1.6 cm left hilar lymph node. No enlarged mediastinal or axillary lymph nodes. Thyroid gland, trachea, and esophagus demonstrate no significant findings. Tiny hiatal hernia. Lungs/Pleura: There is a round 1 cm right upper lobe pulmonary nodule (5:26). There is a subsolid 1.3 x 1.2 cm left upper lobe pulmonary nodule (5:52). Associated left upper lobe bronchial wall thickening. Musculoskeletal: No chest wall abnormality. No suspicious lytic or blastic osseous lesions. No acute displaced fracture. Multilevel degenerative changes of the spine. T4 vertebral body hemangioma. CT ABDOMEN PELVIS FINDINGS Hepatobiliary: The liver is enlarged measuring up to 18 cm. Several scattered hypodense subcentimeter hepatic lesions too small to characterize. No gallstones, gallbladder wall thickening, or pericholecystic fluid. No biliary dilatation. Pancreas: No focal lesion. Normal pancreatic contour. No surrounding inflammatory changes. No main pancreatic ductal dilatation. Spleen: Normal in size without focal abnormality. Adrenals/Urinary Tract: No left adrenal nodularity. There is a 8.1 x 5.3 by 8 cm heterogeneous right upper quadrant lesion likely arising from the right adrenal gland that is noted to abut the right hepatic lobe (3:55) as well as the inferior vena cava (3:62). The lesion leads to mass effect on the inferior vena cava (7:55). Bilateral kidneys  enhance symmetrically. Subcentimeter hypodensities are too small to characterize. No hydronephrosis. No hydroureter. The urinary bladder is unremarkable. Stomach/Bowel: Stomach is within normal limits. No evidence of bowel wall thickening or dilatation. Scattered colonic diverticulosis. Appendix appears normal. Vascular/Lymphatic: No abdominal aorta or iliac aneurysm. Mild atherosclerotic plaque of the aorta and its branches. Enlarged 1.1 cm mesenteric lymph node (3:84). Other lymph nodes are prominent but not enlarged. No pelvic, or inguinal lymphadenopathy. Reproductive: Prostate is unremarkable. Other: No intraperitoneal free fluid. No intraperitoneal free gas. No organized fluid collection. Musculoskeletal: No abdominal wall hernia or abnormality. No suspicious lytic or blastic osseous lesions. No acute displaced fracture. Grade 1 anterolisthesis of L5 on S1. Bilateral L5 pars interarticularis defects. IMPRESSION: 1. A 1.3 cm subsolid left upper lobe pulmonary nodule with associated bronchial wall thickening and possible endoluminal component. 2. Left hilar lymphadenopathy. 3. A 1 cm right upper lobe solid pulmonary nodule. 4. A 8.1 x 5.3 cm right upper quadrant mass likely arising from the right adrenal gland. The mass abuts the right hepatic lobe as well as the right inferior cava leading to mass effect/narrowing of the IVC. 5. Enlarged 1.1 cm mesenteric lymph node. 6. Several scattered hypodense subcentimeter hepatic lesions too small to characterize. 7. Other imaging findings of potential clinical significance: Tiny hiatal hernia. Scattered colonic diverticulosis with no acute diverticulitis. 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 Aortic Atherosclerosis (ICD10-I70.0) including coronary artery calcifications. These results will be called to the ordering clinician or representative by the Radiologist Assistant, and communication documented in the PACS or [employer]. Electronically Signed   By: Iven Finn M.D.   On: 03/26/2021 00:30   CT BIOPSY  Result Date: 03/30/2021 INDICATION: 75 year old gentleman with history of melanoma presents to interventional radiology for biopsy of right adrenal mass. EXAM: CT-guided biopsy of right adrenal mass MEDICATIONS: None. ANESTHESIA/SEDATION: Moderate (conscious) sedation was employed during this procedure. A total of Versed 1 mg and Fentanyl 25 mcg was administered intravenously. Moderate Sedation Time: 17 minutes. The patient's level of consciousness and vital signs were monitored continuously by radiology nursing throughout the procedure under my direct supervision. COMPLICATIONS: SIR Level A - No therapy, no consequence. PROCEDURE: Informed written consent was obtained from the patient after a thorough discussion of the procedural risks, benefits and alternatives. All questions were addressed. Maximal Sterile Barrier Technique was utilized including caps, mask, sterile gowns, sterile gloves, sterile drape, hand hygiene and skin antiseptic. A timeout was performed prior to the initiation of the procedure. Patient position right lateral decubitus on the CT table. Following local lidocaine administration, 17 gauge introducer needle was advanced into the right adrenal mass utilizing CT guidance. 4-18 gauge cores were obtained. Samples were sent to pathology in formalin. Postprocedure CT demonstrated mild retroperitoneal hematoma which did not significantly increased in size on sequential CT. No pneumothorax identified on postprocedure CT. IMPRESSION: CT-guided biopsy of right adrenal mass. Electronically Signed   By: Miachel Roux M.D.   On: 03/30/2021 12:56   DG Chest Portable 1 View  Result Date: 03/25/2021 CLINICAL DATA:  Unsteadiness, headaches, imbalance, intracranial hemorrhage EXAM: PORTABLE CHEST 1 VIEW COMPARISON:  Portable exam 1005 hours compared to 03/11/2013 FINDINGS: Normal heart size, mediastinal  contours, and pulmonary vascularity. Atherosclerotic calcification aorta. Lungs clear. No pulmonary infiltrate, pleural effusion or pneumothorax. Probable small enchondroma proximal LEFT humerus. Osseous demineralization. IMPRESSION: No acute abnormalities. Aortic Atherosclerosis (ICD10-I70.0). Electronically Signed   By: Lavonia Dana M.D.   On: 03/25/2021 10:25     CT Head Wo Contrast  Result Date: 03/25/2021 CLINICAL DATA:  Gait disorder. Intracranial hemorrhage is suspected. EXAM: CT HEAD WITHOUT CONTRAST TECHNIQUE: Contiguous axial images were obtained from the base of the skull through the vertex without intravenous contrast. COMPARISON:  None. FINDINGS: Brain: Large irregular focus of hyperdensity centered in the right basal ganglia with a large amount of vasogenic edema. Findings are compatible with intracranial hemorrhage. The irregular hemorrhagic lesion measures 4.1 x 2.8 x 3.4 cm. Hemorrhage extends down to the right temporal lobe region. There is 6 mm right to left midline shift. There is mild effacement of the right lateral ventricle. No significant intraventricular hemorrhage. No hydrocephalus. Vascular: No hyperdense vessel or unexpected calcification. Skull: Normal. Negative for fracture or focal lesion. Sinuses/Orbits: No acute finding. Other: None. IMPRESSION: 1. Large irregular intracranial hemorrhage centered in the right basal ganglia with a large amount of surrounding vasogenic edema. The morphology and vasogenic edema raises concern for an underlying hemorrhagic lesion. Recommend further characterization with MRI, with and without contrast, to evaluate for a neoplastic process. 2. 6 mm of midline shift. Critical Value/emergent results were called by telephone at the time of interpretation on 03/25/2021 at 8:48 am to provider [facility] , who verbally acknowledged these results. Electronically Signed   By: Markus Daft M.D.   On: 03/25/2021  08:54   MR BRAIN W WO CONTRAST  Result Date:  03/27/2021 CLINICAL DATA:  Follow-up examination for brain mass. EXAM: MRI HEAD WITHOUT AND WITH CONTRAST TECHNIQUE: Multiplanar, multiecho pulse sequences of the brain and surrounding structures were obtained without and with intravenous contrast. CONTRAST:  7.1mL GADAVIST GADOBUTROL 1 MMOL/ML IV SOLN COMPARISON:  Recent brain MRI from 03/25/2021. FINDINGS: Brain: Previously identified hemorrhagic mass centered at the right basal ganglia again seen, not significantly changed measuring 3.3 x 3.2 x 2.6 cm on this exam. Scattered areas of intrinsic T1 hyperintensity with susceptibility artifact consistent with hemorrhage. Few subcentimeter nodular foci of enhancement seen involving the adjacent right caudate and basal ganglia favored to reflect small satellite lesions rather than subacute ischemia (series 9, images 128, 122). The primary mass both involves and splays the surrounding radiating white matter tracts. Prominent surrounding T2/FLAIR signal abnormality throughout the adjacent right frontotemporal region. Mass effect on the adjacent right lateral ventricle which is partially effaced. Associated right-to-left shift measures up to 5 mm. No hydrocephalus or trapping. Basilar cisterns remain patent. No other mass lesion or pathologic enhancement. Underlying mild chronic microvascular ischemic disease. No other evidence for acute or chronic intracranial hemorrhage. No extra-axial collection. Vascular: Major intracranial vascular flow voids are maintained. Skull and upper cervical spine: Craniocervical junction within normal limits. Bone marrow signal intensity normal. No focal marrow replacing lesion. No scalp soft tissue abnormality. Sinuses/Orbits: Patient status post bilateral ocular lens replacement. Globes orbital soft tissues demonstrate no acute finding. Paranasal sinuses are clear. No mastoid effusion. Inner ear structures within normal limits. Other: None. IMPRESSION: 1. Stable size and appearance of  hemorrhagic mass centered at the right basal ganglia. Few adjacent nodular foci of enhancement at the right caudate have an appearance more typical of small satellite lesions rather than subacute ischemia on this exam. Given this, a primary CNS neoplasm would be favored, although an intracranial hemorrhagic metastasis remains on the differential. 2. Surrounding vasogenic edema with up to 5 mm of right-to-left shift, stable. 3. Examination will be used for intraoperative guidance purposes. Electronically Signed   By: Jeannine Boga M.D.   On: 03/27/2021 01:30   MR Brain W and Wo Contrast  Result Date: 03/25/2021 CLINICAL DATA:  Metastatic disease evaluation. Headaches and balance issues for 3 weeks. Found to have intracranial hemorrhage. EXAM: MRI HEAD WITHOUT AND WITH CONTRAST TECHNIQUE: Multiplanar, multiecho pulse sequences of the brain and surrounding structures were obtained without and with intravenous contrast. CONTRAST:  34mL GADAVIST GADOBUTROL 1 MMOL/ML IV SOLN COMPARISON:  Same day CT head. FINDINGS: Brain: There is a solidly enhancing hemorrhagic mass centered in the right basal ganglia/frontal lobe that measures up to 3.4 x 3.8 by 2.9 cm (AP by transverse by craniocaudal). There are a few areas of intrinsic T1 hyperintensity within the lateral and medial aspect of the lesion and marked susceptibility artifact throughout the lesion. There is exuberant surrounding vasogenic edema with mass effect. There is partial effacement of the right lateral ventricle anteriorly and approximately 5 mm of leftward midline shift near the foramina Edgar. No definite evidence of hydrocephalus. Basal cisterns are patent. No extra-axial fluid collection. Enhancing 6 mm focus of restricted diffusion anteromedial to the mass in the region of the caudate, likely an acute or eary subacute infarct. Additional patchy white matter T2/FLAIR hyperintensities likely represent chronic microvascular ischemic disease. No  definite other enhancing lesions identified. Vascular: Major arterial flow voids are maintained at the skull base. Incidental high left parietal developmental venous anomaly. Skull  and upper cervical spine: Normal marrow signal. Sinuses/Orbits: Clear sinuses.  Unremarkable orbits. Other: No mastoid effusions. IMPRESSION: 1. Large, 3.8 cm enhancing and hemorrhagic mass within the right basal ganglia/frontal lobe, which is concerning for a metastasis in this patient with a history of melanoma. High-grade glioma is a differential consideration. 2. Exuberant surrounding vasogenic edema and mass effect with partial effacement of the right lateral ventricle and approximately 5 mm of leftward midline shift. 3. Enhancing 6 mm focus of restricted diffusion anteromedial to the mass in the region of the caudate, likely an acute or eary subacute infarct. Recommend attention on follow-up to exclude an additional lesion. 4. No other enhancing lesions identified. Electronically Signed   By: Margaretha Sheffield MD   On: 03/25/2021 12:16   CT CHEST ABDOMEN PELVIS W CONTRAST  Result Date: 03/26/2021 CLINICAL DATA:  Brain mass or lesion, unknown etiology EXAM: CT CHEST, ABDOMEN, AND PELVIS WITH CONTRAST TECHNIQUE: Multidetector CT imaging of the chest, abdomen and pelvis was performed following the standard protocol during bolus administration of intravenous contrast. CONTRAST:  49mL OMNIPAQUE IOHEXOL 300 MG/ML  SOLN COMPARISON:  None. FINDINGS: CT CHEST FINDINGS Cardiovascular: Normal heart size. No significant pericardial effusion. The thoracic aorta is normal in caliber. Mild atherosclerotic plaque of the thoracic aorta. At least 2 vessel coronary artery calcifications. The main pulmonary artery is normal in caliber. No central or proximal segmental pulmonary embolus. Mediastinum/Nodes: There is an enlarged 1.6 cm left hilar lymph node. No enlarged mediastinal or axillary lymph nodes. Thyroid gland, trachea, and esophagus  demonstrate no significant findings. Tiny hiatal hernia. Lungs/Pleura: There is a round 1 cm right upper lobe pulmonary nodule (5:26). There is a subsolid 1.3 x 1.2 cm left upper lobe pulmonary nodule (5:52). Associated left upper lobe bronchial wall thickening. Musculoskeletal: No chest wall abnormality. No suspicious lytic or blastic osseous lesions. No acute displaced fracture. Multilevel degenerative changes of the spine. T4 vertebral body hemangioma. CT ABDOMEN PELVIS FINDINGS Hepatobiliary: The liver is enlarged measuring up to 18 cm. Several scattered hypodense subcentimeter hepatic lesions too small to characterize. No gallstones, gallbladder wall thickening, or pericholecystic fluid. No biliary dilatation. Pancreas: No focal lesion. Normal pancreatic contour. No surrounding inflammatory changes. No main pancreatic ductal dilatation. Spleen: Normal in size without focal abnormality. Adrenals/Urinary Tract: No left adrenal nodularity. There is a 8.1 x 5.3 by 8 cm heterogeneous right upper quadrant lesion likely arising from the right adrenal gland that is noted to abut the right hepatic lobe (3:55) as well as the inferior vena cava (3:62). The lesion leads to mass effect on the inferior vena cava (7:55). Bilateral kidneys enhance symmetrically. Subcentimeter hypodensities are too small to characterize. No hydronephrosis. No hydroureter. The urinary bladder is unremarkable. Stomach/Bowel: Stomach is within normal limits. No evidence of bowel wall thickening or dilatation. Scattered colonic diverticulosis. Appendix appears normal. Vascular/Lymphatic: No abdominal aorta or iliac aneurysm. Mild atherosclerotic plaque of the aorta and its branches. Enlarged 1.1 cm mesenteric lymph node (3:84). Other lymph nodes are prominent but not enlarged. No pelvic, or inguinal lymphadenopathy. Reproductive: Prostate is unremarkable. Other: No intraperitoneal free fluid. No intraperitoneal free gas. No organized fluid  collection. Musculoskeletal: No abdominal wall hernia or abnormality. No suspicious lytic or blastic osseous lesions. No acute displaced fracture. Grade 1 anterolisthesis of L5 on S1. Bilateral L5 pars interarticularis defects. IMPRESSION: 1. A 1.3 cm subsolid left upper lobe pulmonary nodule with associated bronchial wall thickening and possible endoluminal component. 2. Left hilar lymphadenopathy. 3. A 1 cm right  upper lobe solid pulmonary nodule. 4. A 8.1 x 5.3 cm right upper quadrant mass likely arising from the right adrenal gland. The mass abuts the right hepatic lobe as well as the right inferior cava leading to mass effect/narrowing of the IVC. 5. Enlarged 1.1 cm mesenteric lymph node. 6. Several scattered hypodense subcentimeter hepatic lesions too small to characterize. 7. Other imaging findings of potential clinical significance: Tiny hiatal hernia. Scattered colonic diverticulosis with no acute diverticulitis. 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 Aortic Atherosclerosis (ICD10-I70.0) including coronary artery calcifications. These results will be called to the ordering clinician or representative by the Radiologist Assistant, and communication documented in the PACS or [employer]. Electronically Signed   By: Iven Finn M.D.   On: 03/26/2021 00:30   CT BIOPSY  Result Date: 03/30/2021 INDICATION: 75 year old gentleman with history of melanoma presents to interventional radiology for biopsy of right adrenal mass. EXAM: CT-guided biopsy of right adrenal mass MEDICATIONS: None. ANESTHESIA/SEDATION: Moderate (conscious) sedation was employed during this procedure. A total of Versed 1 mg and Fentanyl 25 mcg was administered intravenously. Moderate Sedation Time: 17 minutes. The patient's level of consciousness and vital signs were monitored continuously by radiology nursing throughout the procedure under my direct supervision. COMPLICATIONS: SIR  Level A - No therapy, no consequence. PROCEDURE: Informed written consent was obtained from the patient after a thorough discussion of the procedural risks, benefits and alternatives. All questions were addressed. Maximal Sterile Barrier Technique was utilized including caps, mask, sterile gowns, sterile gloves, sterile drape, hand hygiene and skin antiseptic. A timeout was performed prior to the initiation of the procedure. Patient position right lateral decubitus on the CT table. Following local lidocaine administration, 17 gauge introducer needle was advanced into the right adrenal mass utilizing CT guidance. 4-18 gauge cores were obtained. Samples were sent to pathology in formalin. Postprocedure CT demonstrated mild retroperitoneal hematoma which did not significantly increased in size on sequential CT. No pneumothorax identified on postprocedure CT. IMPRESSION: CT-guided biopsy of right adrenal mass. Electronically Signed   By: Miachel Roux M.D.   On: 03/30/2021 12:56   DG Chest Portable 1 View  Result Date: 03/25/2021 CLINICAL DATA:  Unsteadiness, headaches, imbalance, intracranial hemorrhage EXAM: PORTABLE CHEST 1 VIEW COMPARISON:  Portable exam 1005 hours compared to 03/11/2013 FINDINGS: Normal heart size, mediastinal contours, and pulmonary vascularity. Atherosclerotic calcification aorta. Lungs clear. No pulmonary infiltrate, pleural effusion or pneumothorax. Probable small enchondroma proximal LEFT humerus. Osseous demineralization. IMPRESSION: No acute abnormalities. Aortic Atherosclerosis (ICD10-I70.0). Electronically Signed   By: Lavonia Dana M.D.   On: 03/25/2021 10:25   Assessment and Plan:   This is a 75 year old male with a 6-week history of feeling off balance, dizziness, falls, headaches.  He was found to have a large right upper quadrant mass which appears to arise from the right adrenal gland as well as pulmonary nodules, left hilar lymphadenopathy, and right basal ganglia brain mass.   Biopsy from adrenal mass pending.  Discussed imaging findings with the patient and his wife.  Findings are concerning for metastatic malignancy.  Differentials include a primary lung cancer versus a primary adrenal mass versus melanoma.  We discussed that treatment options depend on pathology results.  We are hopeful we will obtain preliminary results tomorrow.  Depending on pathology, neurosurgery tentatively planning right frontal craniotomy on 6/1.  We will plan to follow-up with the patient and his wife to discuss treatment options once pathology results.  Thank you for  this referral.   Mikey Bussing, DNP, AGPCNP-BC, AOCNP   Attending Note  I personally saw the patient, reviewed the chart and examined the patient. The plan of care was discussed with the patient and the admitting team. I agree with the assessment and plan as documented above. Thank you very much for the consultation. 1.  Metastatic carcinoma: Differential diagnosis between metastatic small cell lung cancer versus metastatic melanoma I reviewed the results of the brain MRI and the CT scans.  There is a large irregular hemorrhagic tumor in the right basal ganglia with vasogenic edema measuring 3.8 cm.  CT CAP revealed 8.1 cm mass in the right adrenal gland with mass-effect and narrowing of the IVC, enlarged 1.1 cm mesenteric lymph nodes, subcentimeter hepatic lesions too small to characterize.  2. adrenal biopsy has been performed and we are waiting for the pathology report.  If it does show small cell carcinoma then we will discuss doing immediate systemic chemotherapy. Neurosurgery is awaiting the results of the initial biopsy determine their surgical plan because she may get craniotomy and resection if the final tumor is not a small cell lung cancer.  Will follow along.

## 2021-03-30 NOTE — Plan of Care (Signed)
  Problem: Education: Goal: Knowledge of General Education information will improve Description: Including pain rating scale, medication(s)/side effects and non-pharmacologic comfort measures Outcome: Progressing   Problem: Health Behavior/Discharge Planning: Goal: Ability to manage health-related needs will improve Outcome: Progressing   Problem: Clinical Measurements: Goal: Ability to maintain clinical measurements within normal limits will improve Outcome: Progressing Goal: Will remain free from infection Outcome: Progressing Goal: Diagnostic test results will improve Outcome: Progressing Goal: Respiratory complications will improve Outcome: Progressing Goal: Cardiovascular complication will be avoided Outcome: Progressing   Problem: Safety: Goal: Ability to remain free from injury will improve Outcome: Progressing

## 2021-03-30 NOTE — Procedures (Signed)
Interventional Radiology Procedure Note  Procedure: CT guided biopsy  Indication: Right Adrenal mass  Findings: Please refer to procedural dictation for full description.  Complications: None  EBL: < 10 mL  Miachel Roux, MD [phone]

## 2021-03-30 NOTE — Progress Notes (Signed)
   Providing Compassionate, Quality Care - Together  NEUROSURGERY PROGRESS NOTE   S: No issues overnight. HA gone with steroids  O: EXAM:  BP 126/73   Pulse (!) 51   Temp 97.7 F (36.5 C) (Oral)   Resp 15   Ht 5\' 8"  (1.727 m)   Wt 73.5 kg   SpO2 97%   BMI 24.63 kg/m   Awake, alert, oriented  PERRL EOMI Speech fluent, appropriate  CNs grossly intact  5/5 BUE/BLE   ASSESSMENT:  75 y.o. male with  1. Right BG tumor, ? primary  PLAN: - CT CAP: adrenal mass and lung nodules - s/p biopsy this am, path results tomorrow am per pathology - MRI brainlab protocol done - dvt ppx - dex 4q6 - npo midnight for possible crani, if needed based off path, scheduled for tomorrow afternoon -pt/ot   Thank you for allowing me to participate in this patient's care.  Please do not hesitate to call with questions or concerns.   Elwin Sleight, [facility] Cell: [phone]

## 2021-03-30 NOTE — Care Management Important Message (Signed)
Important Message  Patient Details  Name: Shannon Horton. MRN: [id number] Date of Birth: July 17, 1946   Medicare Important Message Given:  Yes     [name] 03/30/2021, 3:27 PM

## 2021-03-30 NOTE — Progress Notes (Signed)
Neurosurgery Service Progress Note  Subjective: No acute events overnight  Objective: Vitals:   03/29/21 1143 03/29/21 1554 03/29/21 2029 03/29/21 2357  BP: 107/60 109/68 123/75 122/74  Pulse: (!) 49 (!) 51 (!) 54 (!) 49  Resp: 18 18 18 18   Temp: 97.8 F (36.6 C) 97.8 F (36.6 C) 97.6 F (36.4 C) 98.1 F (36.7 C)  TempSrc: Oral Oral Oral Oral  SpO2: 98% 95% 96% 97%  Weight:      Height:        Physical Exam: Awake/alert, FCx4  Assessment & Plan: 74 y.o. man w/ R insular mass, CT CAP w/ lung and adrenal nodules.   -holding asa/plavix for OR this week -IR guided bx of renal mass tomorrow, NPO p MN  -SCDs/TEDs, SQH -continue IV dexamethasone for cerebral vasogenic edema  Judith Part  03/30/21 12:38 AM

## 2021-03-31 ENCOUNTER — Inpatient Hospital Stay (HOSPITAL_COMMUNITY): Payer: Medicare PPO | Admitting: Certified Registered"

## 2021-03-31 ENCOUNTER — Encounter (HOSPITAL_COMMUNITY): Payer: Self-pay | Admitting: Neurological Surgery

## 2021-03-31 ENCOUNTER — Telehealth: Payer: Self-pay | Admitting: Neurology

## 2021-03-31 ENCOUNTER — Inpatient Hospital Stay (HOSPITAL_COMMUNITY)
Admission: EM | Disposition: A | Discharge status: Rehabilitation Facility | Payer: Self-pay | Source: Home / Self Care | Attending: Neurological Surgery

## 2021-03-31 DIAGNOSIS — Z9889 Other specified postprocedural states: Secondary | ICD-10-CM

## 2021-03-31 HISTORY — PX: APPLICATION OF CRANIAL NAVIGATION: SHX6578

## 2021-03-31 HISTORY — PX: CRANIOTOMY: SHX93

## 2021-03-31 LAB — CREATININE, SERUM
Creatinine, Ser: 0.73 mg/dL (ref 0.61–1.24)
GFR, Estimated: >60 mL/min (ref >60)

## 2021-03-31 LAB — CBC
HCT: 40.3 % (ref 39.0–52.0)
Hemoglobin: 14.3 g/dL (ref 13.0–17.0)
MCH: 31.7 pg (ref 26.0–34.0)
MCHC: 35.5 g/dL (ref 30.0–36.0)
MCV: 89.4 fL (ref 80.0–100.0)
Platelets: 233 10*3/uL (ref 150–400)
RBC: 4.51 MIL/uL (ref 4.22–5.81)
RDW: 12.2 % (ref 11.5–15.5)
WBC: 11.5 10*3/uL — ABNORMAL HIGH (ref 4.0–10.5)
nRBC: 0 % (ref 0.0–0.2)

## 2021-03-31 LAB — ABO/RH: ABO/RH(D): O POS

## 2021-03-31 LAB — MRSA PCR SCREENING: MRSA by PCR: NEGATIVE

## 2021-03-31 LAB — TYPE AND SCREEN
ABO/RH(D): O POS
Antibody Screen: NEGATIVE

## 2021-03-31 LAB — SURGICAL PCR SCREEN
MRSA, PCR: NEGATIVE
Staphylococcus aureus: NEGATIVE

## 2021-03-31 SURGERY — CRANIOTOMY TUMOR EXCISION
Anesthesia: General | Site: Head

## 2021-03-31 MED ORDER — PANTOPRAZOLE SODIUM 40 MG IV SOLR
40.0000 mg | Freq: Every day | INTRAVENOUS | Status: DC
  Administered 2021-03-31 – 2021-04-02 (×3): 40 mg via INTRAVENOUS
  Filled 2021-03-31 (×3): qty 40

## 2021-03-31 MED ORDER — LABETALOL HCL 5 MG/ML IV SOLN
10.0000 mg | INTRAVENOUS | Status: DC | PRN
  Filled 2021-03-31: qty 4

## 2021-03-31 MED ORDER — DEXAMETHASONE SODIUM PHOSPHATE 10 MG/ML IJ SOLN
INTRAMUSCULAR | Status: DC | PRN
  Administered 2021-03-31: 6 mg via INTRAVENOUS

## 2021-03-31 MED ORDER — PROPOFOL 10 MG/ML IV BOLUS
INTRAVENOUS | Status: AC
  Filled 2021-03-31: qty 20

## 2021-03-31 MED ORDER — ORAL CARE MOUTH RINSE: 15.0000 mL | Freq: Once | OROMUCOSAL | Status: AC

## 2021-03-31 MED ORDER — LIDOCAINE-EPINEPHRINE 1 %-1:100000 IJ SOLN
INTRAMUSCULAR | Status: AC
  Filled 2021-03-31: qty 1

## 2021-03-31 MED ORDER — HYDROCODONE-ACETAMINOPHEN 5-325 MG PO TABS
1.0000 | ORAL_TABLET | ORAL | Status: DC | PRN
Start: 2021-03-31 — End: 2021-04-06
  Administered 2021-04-02 – 2021-04-05 (×4): 1 via ORAL
  Filled 2021-03-31 (×4): qty 1

## 2021-03-31 MED ORDER — APREPITANT 40 MG PO CAPS: 40.0000 mg | ORAL_CAPSULE | Freq: Once | ORAL | Status: DC

## 2021-03-31 MED ORDER — HEPARIN SODIUM (PORCINE) 5000 UNIT/ML IJ SOLN
5000.0000 [IU] | Freq: Two times a day (BID) | INTRAMUSCULAR | Status: DC
  Administered 2021-04-02 – 2021-04-06 (×9): 5000 [IU] via SUBCUTANEOUS
  Filled 2021-03-31 (×9): qty 1

## 2021-03-31 MED ORDER — THROMBIN 20000 UNITS EX KIT: PACK | CUTANEOUS | Status: DC | PRN

## 2021-03-31 MED ORDER — FENTANYL CITRATE (PF) 100 MCG/2ML IJ SOLN: 25.0000 ug | INTRAMUSCULAR | Status: DC | PRN

## 2021-03-31 MED ORDER — THROMBIN 20000 UNITS EX SOLR
CUTANEOUS | Status: AC
  Filled 2021-03-31: qty 20000

## 2021-03-31 MED ORDER — MORPHINE SULFATE (PF) 2 MG/ML IV SOLN
1.0000 mg | INTRAVENOUS | Status: DC | PRN
Start: 2021-03-31 — End: 2021-04-06
  Administered 2021-04-01: 2 mg via INTRAVENOUS
  Filled 2021-03-31: qty 1

## 2021-03-31 MED ORDER — BUPIVACAINE-EPINEPHRINE (PF) 0.5% -1:200000 IJ SOLN
INTRAMUSCULAR | Status: DC | PRN
  Administered 2021-03-31: 6 mL

## 2021-03-31 MED ORDER — LIDOCAINE 2% (20 MG/ML) 5 ML SYRINGE
INTRAMUSCULAR | Status: DC | PRN
  Administered 2021-03-31: 60 mg via INTRAVENOUS

## 2021-03-31 MED ORDER — BUPIVACAINE-EPINEPHRINE 0.5% -1:200000 IJ SOLN
INTRAMUSCULAR | Status: AC
  Filled 2021-03-31: qty 1

## 2021-03-31 MED ORDER — HEMOSTATIC AGENTS (NO CHARGE) OPTIME
TOPICAL | Status: DC | PRN
  Administered 2021-03-31: 1 via TOPICAL

## 2021-03-31 MED ORDER — PROMETHAZINE HCL 25 MG PO TABS: 12.5000 mg | ORAL_TABLET | ORAL | Status: DC | PRN

## 2021-03-31 MED ORDER — ONDANSETRON HCL 4 MG/2ML IJ SOLN
INTRAMUSCULAR | Status: AC
  Filled 2021-03-31: qty 2

## 2021-03-31 MED ORDER — ROCURONIUM BROMIDE 10 MG/ML (PF) SYRINGE
PREFILLED_SYRINGE | INTRAVENOUS | Status: DC | PRN
  Administered 2021-03-31: 70 mg via INTRAVENOUS
  Administered 2021-03-31: 50 mg via INTRAVENOUS
  Administered 2021-03-31: 30 mg via INTRAVENOUS

## 2021-03-31 MED ORDER — CHLORHEXIDINE GLUCONATE CLOTH 2 % EX PADS
6.0000 | MEDICATED_PAD | Freq: Every day | CUTANEOUS | Status: DC
  Administered 2021-03-31 – 2021-04-03 (×3): 6 via TOPICAL

## 2021-03-31 MED ORDER — CEFAZOLIN SODIUM-DEXTROSE 1-4 GM/50ML-% IV SOLN
1.0000 g | Freq: Three times a day (TID) | INTRAVENOUS | Status: AC
  Administered 2021-03-31 – 2021-04-01 (×2): 1 g via INTRAVENOUS
  Filled 2021-03-31 (×2): qty 50

## 2021-03-31 MED ORDER — OXYCODONE HCL 5 MG PO TABS: 5.0000 mg | ORAL_TABLET | Freq: Once | ORAL | Status: DC | PRN

## 2021-03-31 MED ORDER — EPHEDRINE 5 MG/ML INJ
INTRAVENOUS | Status: AC
  Filled 2021-03-31: qty 10

## 2021-03-31 MED ORDER — ONDANSETRON HCL 4 MG/2ML IJ SOLN: 4.0000 mg | Freq: Once | INTRAMUSCULAR | Status: DC | PRN

## 2021-03-31 MED ORDER — SUGAMMADEX SODIUM 200 MG/2ML IV SOLN
INTRAVENOUS | Status: DC | PRN
  Administered 2021-03-31: 200 mg via INTRAVENOUS

## 2021-03-31 MED ORDER — PROPOFOL 10 MG/ML IV BOLUS
INTRAVENOUS | Status: DC | PRN
  Administered 2021-03-31 (×2): 50 mg via INTRAVENOUS
  Administered 2021-03-31: 160 mg via INTRAVENOUS
  Administered 2021-03-31: 40 mg via INTRAVENOUS
  Administered 2021-03-31: 50 mg via INTRAVENOUS

## 2021-03-31 MED ORDER — OXYCODONE HCL 5 MG/5ML PO SOLN
5.0000 mg | Freq: Once | ORAL | Status: DC | PRN
Start: 2021-03-31 — End: 2021-03-31

## 2021-03-31 MED ORDER — CHLORHEXIDINE GLUCONATE 0.12 % MT SOLN
15.0000 mL | Freq: Once | OROMUCOSAL | Status: AC
  Administered 2021-03-31: 15 mL via OROMUCOSAL
  Filled 2021-03-31: qty 15

## 2021-03-31 MED ORDER — PHENYLEPHRINE HCL-NACL 10-0.9 MG/250ML-% IV SOLN
INTRAVENOUS | Status: DC | PRN
  Administered 2021-03-31: 25 ug/min via INTRAVENOUS

## 2021-03-31 MED ORDER — THROMBIN 5000 UNITS EX SOLR: OROMUCOSAL | Status: DC | PRN

## 2021-03-31 MED ORDER — BACITRACIN ZINC 500 UNIT/GM EX OINT
TOPICAL_OINTMENT | CUTANEOUS | Status: AC
  Filled 2021-03-31: qty 28.35

## 2021-03-31 MED ORDER — 0.9 % SODIUM CHLORIDE (POUR BTL) OPTIME
TOPICAL | Status: DC | PRN
  Administered 2021-03-31: 3000 mL

## 2021-03-31 MED ORDER — LEVETIRACETAM IN NACL 500 MG/100ML IV SOLN
500.0000 mg | Freq: Two times a day (BID) | INTRAVENOUS | Status: DC
  Administered 2021-03-31 – 2021-04-03 (×6): 500 mg via INTRAVENOUS
  Filled 2021-03-31 (×6): qty 100

## 2021-03-31 MED ORDER — THROMBIN 5000 UNITS EX SOLR
CUTANEOUS | Status: AC
  Filled 2021-03-31: qty 5000

## 2021-03-31 MED ORDER — CHLORHEXIDINE GLUCONATE CLOTH 2 % EX PADS: 6.0000 | MEDICATED_PAD | Freq: Once | CUTANEOUS | Status: DC

## 2021-03-31 MED ORDER — CEFAZOLIN SODIUM-DEXTROSE 2-4 GM/100ML-% IV SOLN: 2.0000 g | INTRAVENOUS | Status: DC

## 2021-03-31 MED ORDER — LIDOCAINE-EPINEPHRINE 1 %-1:100000 IJ SOLN
INTRAMUSCULAR | Status: DC | PRN
  Administered 2021-03-31: 6 mL

## 2021-03-31 MED ORDER — EPHEDRINE SULFATE-NACL 50-0.9 MG/10ML-% IV SOSY
PREFILLED_SYRINGE | INTRAVENOUS | Status: DC | PRN
  Administered 2021-03-31: 5 mg via INTRAVENOUS
  Administered 2021-03-31: 2.5 mg via INTRAVENOUS
  Administered 2021-03-31: 10 mg via INTRAVENOUS
  Administered 2021-03-31 (×3): 5 mg via INTRAVENOUS

## 2021-03-31 MED ORDER — MANNITOL 20 % IV SOLN: INTRAVENOUS | Status: DC | PRN

## 2021-03-31 MED ORDER — ONDANSETRON HCL 4 MG PO TABS: 4.0000 mg | ORAL_TABLET | ORAL | Status: DC | PRN

## 2021-03-31 MED ORDER — FENTANYL CITRATE (PF) 250 MCG/5ML IJ SOLN
INTRAMUSCULAR | Status: AC
  Filled 2021-03-31: qty 5

## 2021-03-31 MED ORDER — SODIUM CHLORIDE 0.9 % IV SOLN: INTRAVENOUS | Status: DC

## 2021-03-31 MED ORDER — LEVETIRACETAM IN NACL 1000 MG/100ML IV SOLN
1000.0000 mg | INTRAVENOUS | Status: AC
  Administered 2021-03-31: 1000 mg via INTRAVENOUS
  Filled 2021-03-31: qty 100

## 2021-03-31 MED ORDER — ONDANSETRON HCL 4 MG/2ML IJ SOLN: 4.0000 mg | INTRAMUSCULAR | Status: DC | PRN

## 2021-03-31 MED ORDER — FENTANYL CITRATE (PF) 250 MCG/5ML IJ SOLN
INTRAMUSCULAR | Status: DC | PRN
  Administered 2021-03-31: 100 ug via INTRAVENOUS
  Administered 2021-03-31 (×4): 50 ug via INTRAVENOUS

## 2021-03-31 MED ORDER — BACITRACIN ZINC 500 UNIT/GM EX OINT
TOPICAL_OINTMENT | CUTANEOUS | Status: DC | PRN
  Administered 2021-03-31: 1 via TOPICAL

## 2021-03-31 MED ORDER — ROCURONIUM BROMIDE 10 MG/ML (PF) SYRINGE
PREFILLED_SYRINGE | INTRAVENOUS | Status: AC
  Filled 2021-03-31: qty 10

## 2021-03-31 SURGICAL SUPPLY — 67 items
BAND INSRT 18 STRL LF DISP RB (MISCELLANEOUS) ×2
BAND RUBBER #18 3X1/16 STRL (MISCELLANEOUS) ×4 IMPLANT
BIT DRILL WIRE PASS 1.3MM (BIT) IMPLANT
BLADE CLIPPER SURG (BLADE) ×2 IMPLANT
BUR CARBIDE MATCH 3.0 (BURR) ×2 IMPLANT
BUR SPIRAL ROUTER 2.3 (BUR) ×2 IMPLANT
CANISTER SUCT 3000ML PPV (MISCELLANEOUS) ×2 IMPLANT
CARTRIDGE OIL MAESTRO DRILL (MISCELLANEOUS) ×1 IMPLANT
COVER BURR HOLE 14 (Orthopedic Implant) ×2 IMPLANT
DIFFUSER DRILL AIR PNEUMATIC (MISCELLANEOUS) ×2 IMPLANT
DRAPE MICROSCOPE LEICA (MISCELLANEOUS) ×2 IMPLANT
DRAPE NEUROLOGICAL W/INCISE (DRAPES) ×2 IMPLANT
DRAPE SHEET LG 3/4 BI-LAMINATE (DRAPES) ×2 IMPLANT
DRAPE SURG 17X23 STRL (DRAPES) IMPLANT
DRAPE WARM FLUID 44X44 (DRAPES) ×2 IMPLANT
DRILL WIRE PASS 1.3MM (BIT)
DRSG AQUACEL AG ADV 3.5X 6 (GAUZE/BANDAGES/DRESSINGS) ×2 IMPLANT
DURAPREP 6ML APPLICATOR 50/CS (WOUND CARE) ×2 IMPLANT
ELECT COATED BLADE 2.86 ST (ELECTRODE) ×2 IMPLANT
ELECT REM PT RETURN 9FT ADLT (ELECTROSURGICAL) ×2
ELECTRODE REM PT RTRN 9FT ADLT (ELECTROSURGICAL) ×1 IMPLANT
FORCEPS BIPO MALIS IRRIG 9X1.5 (NEUROSURGERY SUPPLIES) ×2 IMPLANT
GAUZE 4X4 16PLY RFD (DISPOSABLE) IMPLANT
GAUZE SPONGE 4X4 12PLY STRL (GAUZE/BANDAGES/DRESSINGS) IMPLANT
GLOVE ECLIPSE 8.0 STRL XLNG CF (GLOVE) ×4 IMPLANT
GLOVE SRG 8 PF TXTR STRL LF DI (GLOVE) ×2 IMPLANT
GLOVE SURG UNDER POLY LF SZ8 (GLOVE) ×4
GOWN STRL REUS W/ TWL LRG LVL3 (GOWN DISPOSABLE) IMPLANT
GOWN STRL REUS W/ TWL XL LVL3 (GOWN DISPOSABLE) ×1 IMPLANT
GOWN STRL REUS W/TWL 2XL LVL3 (GOWN DISPOSABLE) IMPLANT
GOWN STRL REUS W/TWL LRG LVL3 (GOWN DISPOSABLE) ×4
GOWN STRL REUS W/TWL XL LVL3 (GOWN DISPOSABLE) ×6
GRAFT DURAGEN MATRIX 3WX3L (Graft) ×2 IMPLANT
GRAFT DURAGEN MATRIX 3X3 SNGL (Graft) IMPLANT
HEMOSTAT POWDER KIT SURGIFOAM (HEMOSTASIS) ×2 IMPLANT
HEMOSTAT SURGICEL 2X14 (HEMOSTASIS) ×2 IMPLANT
HOOK RETRACTION 12 ELAST STAY (MISCELLANEOUS) ×3 IMPLANT
KIT BASIN OR (CUSTOM PROCEDURE TRAY) ×2 IMPLANT
KIT TURNOVER KIT B (KITS) ×2 IMPLANT
MARKER SPHERE PSV REFLC 13MM (MARKER) ×6 IMPLANT
NEEDLE HYPO 22GX1.5 SAFETY (NEEDLE) ×2 IMPLANT
NS IRRIG 1000ML POUR BTL (IV SOLUTION) ×2 IMPLANT
OIL CARTRIDGE MAESTRO DRILL (MISCELLANEOUS) ×2
PACK CRANIOTOMY CUSTOM (CUSTOM PROCEDURE TRAY) ×2 IMPLANT
PERFORATOR LRG  14-11MM (BIT) ×2
PERFORATOR LRG 14-11MM (BIT) ×1 IMPLANT
PIN MAYFIELD SKULL DISP (PIN) ×2 IMPLANT
PLATE CRANIAL CMF UNIV (Plate) ×2 IMPLANT
SCREW UNIII AXS SD 1.5X4 (Screw) ×15 IMPLANT
SET TUBING IRRIGATION DISP (TUBING) ×2 IMPLANT
SPONGE NEURO XRAY DETECT 1X3 (DISPOSABLE) IMPLANT
SPONGE SURGIFOAM ABS GEL 100 (HEMOSTASIS) ×2 IMPLANT
STAPLER VISISTAT 35W (STAPLE) ×2 IMPLANT
STOCKINETTE 6  STRL (DRAPES) ×2
STOCKINETTE 6 STRL (DRAPES) ×1 IMPLANT
STRIP CLOSURE SKIN 1/2X4 (GAUZE/BANDAGES/DRESSINGS) ×2 IMPLANT
SUT NURALON 4 0 TR CR/8 (SUTURE) ×4 IMPLANT
SUT VIC AB 0 CT1 18XCR BRD8 (SUTURE) ×1 IMPLANT
SUT VIC AB 0 CT1 8-18 (SUTURE) ×2
SUT VIC AB 2-0 CP2 18 (SUTURE) ×2 IMPLANT
SUT VICRYL RAPIDE 4/0 PS 2 (SUTURE) ×2 IMPLANT
TOWEL GREEN STERILE (TOWEL DISPOSABLE) ×2 IMPLANT
TOWEL GREEN STERILE FF (TOWEL DISPOSABLE) ×2 IMPLANT
TRAY FOLEY MTR SLVR 16FR STAT (SET/KITS/TRAYS/PACK) ×2 IMPLANT
TUBE CONNECTING 12X1/4 (SUCTIONS) ×2 IMPLANT
UNDERPAD 30X36 HEAVY ABSORB (UNDERPADS AND DIAPERS) ×2 IMPLANT
WATER STERILE IRR 1000ML POUR (IV SOLUTION) ×2 IMPLANT

## 2021-03-31 NOTE — Op Note (Signed)
Providing Compassionate, Quality Care - Together  Date of service: 03/31/2021  PREOP DIAGNOSIS:  Right basal ganglia metastasis History of melanoma Lung nodules, right adrenal mass  POSTOP DIAGNOSIS: Same  PROCEDURE: Stereotactic right frontal craniotomy for resection of intra-axial tumor Intraoperative use of microscopy, for microdissection Intraoperative use of stereotaxy, BrainLab  SURGEON: Dr. Pieter Partridge C. Takyla Kuchera, DO  ASSISTANT: Dr. Duffy Rhody, MD  ANESTHESIA: General Endotracheal  EBL: 200 cc  SPECIMENS: Right frontal tumor  DRAINS: None  COMPLICATIONS: None  CONDITION: Hemodynamically stable  HISTORY: Marquee Fuchs. is a 75 y.o. male that presented with headaches, left-sided weakness and intermittent difficulty walking and falls.  He CT of the brain and MRI of the brain revealed a right basal ganglia metastasis with hemorrhagic components.  CT chest abdomen pelvis revealed some lung nodules and a large right adrenal mass that was biopsied and preliminary read as melanoma.  Due to the 3.8 cm basal ganglia tumor with midline shift and cerebral edema, I recommended surgical resection.  I discussed all the risks, benefits and expected outcomes with the patient, his son and his wife at bedside and they agreed to proceed.  PROCEDURE IN DETAIL: The patient was brought to the operating room. After induction of general anesthesia, the patient was positioned on the operative table in the supine position. All pressure points were meticulously padded.  His head was placed in the [place] head holder slightly turned to the left to expose the right frontal region.  The right frontotemporal region was clipped free of hair.  Using BrainLab, the neuro navigation was registered to the MRI and accuracy was verified to be excellent.  A curvilinear frontotemporal skin incision was then marked out and prepped and draped in the usual sterile fashion. Physician driven time out was  performed.  Incision was made a 10 blade down to the cranium.  Raney clips were applied to the scalp edges.  Subperiosteal dissection was performed anteriorly.  The temporalis was cut with the Bovie and reflected anteriorly as a myocutaneous flap exposing the frontotemporal region.  Self-retaining retractors were placed.  A right frontal craniotomy was performed with a high-speed drill and normal fashion centered over the planned trajectory through the middle frontal gyrus.  The bone flap was elevated carefully and stored on the back table.  The dura was incised in a cruciate fashion with a 15 blade and Metzenbaum scissors.  Dural tack ups were placed with 4-0 Nurolon suture.  Using neuro navigation, the planned trajectory through the right middle frontal gyrus was confirmed.  The microscope was sterilely draped and brought into the field and used for the remainder of the surgery.  A corticotomy was performed on the right middle frontal gyrus using bipolar cautery and a 15 blade.  Using bipolar cautery and suction, white matter dissection was performed down to the tumor.  The tumor was then circumferentially cauterized with bipolar cautery and disconnected from its peritumoral feeders.  The tumor was noted to be hemorrhagic and hemosiderin stained.  There were multiple sizable feeders encountered that were cut and coagulated with the bipolar cautery and microscissors.  The tumor was also gently circumferentially dissected with Penfield 1 elevating it off of the surrounding gliotic and hemosiderin stained white matter.  Once circumferential access was obtained around the tumor, the tumor was gently elevated out of the resection cavity and sent for permanent pathology.  The edges of the surgical resection cavity were carefully inspected for any remaining tumor.  Hemostasis was  achieved with bipolar cautery and Surgifoam.  The resection cavity was copiously irrigated for a period of minutes and noted to be  excellently hemostatic.  The dural tack up stitches were cut and the dura was closed with 4-0 Nurolon sutures.  The epidural space was hemostased with Surgiflo.  The epidural space was noted to be hemostatic. DuraGen was placed over the durotomy site.  The craniotomy flap was replaced with the cranial plating system in its original position.  Self-retaining retractors were taken out of the wound.  Hemostasis was achieved with bipolar cautery.  The temporalis was reapproximated with 0 Vicryl sutures.  Raney clips were removed and 2-0 Vicryl sutures were used to close the galea.  Staples were used to close the skin.  Sterile dressing was applied.  The drapes were taken down.  The patient was removed from the Mayfield head holder, the pin sites were stable.   At the end of the case all sponge, needle, and instrument counts were correct. The patient was then transferred to the stretcher, extubated, and taken to the post-anesthesia care unit in stable hemodynamic condition.

## 2021-03-31 NOTE — Telephone Encounter (Signed)
Message has been sent to [place] to have Dat Scan canceled.

## 2021-03-31 NOTE — Anesthesia Procedure Notes (Signed)
Arterial Line Insertion Start/End6/10/2020 2:50 PM, 03/31/2021 3:00 PM Performed by: Lance Coon, CRNA, CRNA  Preanesthetic checklist: patient identified, IV checked, site marked, risks and benefits discussed, surgical consent, monitors and equipment checked, pre-op evaluation, timeout performed and anesthesia consent Lidocaine 1% used for infiltration Left, radial was placed Catheter size: 20 G Hand hygiene performed , maximum sterile barriers used  and Seldinger technique used  Attempts: 1 Procedure performed without using ultrasound guided technique. Following insertion, dressing applied and Biopatch. Post procedure assessment: normal and unchanged  Patient tolerated the procedure well with no immediate complications.

## 2021-03-31 NOTE — Transfer of Care (Signed)
Immediate Anesthesia Transfer of Care Note  Patient: Shannon Horton.  Procedure(s) Performed: RIGHT FRONTAL CRANIOTOMY FOR TUMOR (N/A Head) APPLICATION OF CRANIAL NAVIGATION (N/A Head)  Patient Location: PACU  Anesthesia Type:General  Level of Consciousness: awake  Airway & Oxygen Therapy: Patient Spontanous Breathing and Patient connected to face mask oxygen  Post-op Assessment: Report given to RN and Post -op Vital signs reviewed and stable  Post vital signs: Reviewed and stable  Last Vitals:  Vitals Value Taken Time  BP 128/65 03/31/21 1910  Temp 36.2 C 03/31/21 1910  Pulse 73 03/31/21 1919  Resp 16 03/31/21 1919  SpO2 99 % 03/31/21 1919  Vitals shown include unvalidated device data.  Last Pain:  Vitals:   03/31/21 1159  TempSrc:   PainSc: 0-No pain      Patients Stated Pain Goal: 0 (37/10/62 6948)  Complications: No complications documented.

## 2021-03-31 NOTE — Telephone Encounter (Signed)
Called patients wife and spoke with her.  She stated that pts scan had been rescheduled and pt was rapidly going downhill so went to PCP as we had directed and after much discussion CT ordered and lesion identifed.  Lead to MRI.  I reviewed that.  Pt going to surgery today.  She expressed appreciation for the call. Told her to let us know if there is anything we can do.  Mahina, please make sure DaT scan is cancelled.

## 2021-03-31 NOTE — Anesthesia Procedure Notes (Signed)
Procedure Name: Intubation Date/Time: 03/31/2021 3:27 PM Performed by: Alain Marion, CRNA Pre-anesthesia Checklist: Patient identified, Emergency Drugs available, Suction available and Patient being monitored Patient Re-evaluated:Patient Re-evaluated prior to induction Oxygen Delivery Method: Circle System Utilized Preoxygenation: Pre-oxygenation with 100% oxygen Induction Type: IV induction Ventilation: Mask ventilation without difficulty Laryngoscope Size: Mac and 4 Grade View: Grade I Tube type: Oral Tube size: 7.5 mm Number of attempts: 1 Airway Equipment and Method: Stylet and Oral airway Placement Confirmation: ETT inserted through vocal cords under direct vision,  positive ETCO2 and breath sounds checked- equal and bilateral Secured at: 21 cm Tube secured with: Tape Dental Injury: Teeth and Oropharynx as per pre-operative assessment  Comments: Performed by Zelphia Cairo

## 2021-03-31 NOTE — Progress Notes (Signed)
Per MD sips with morning meds are okay

## 2021-03-31 NOTE — Anesthesia Preprocedure Evaluation (Addendum)
Anesthesia Evaluation  Patient identified by MRN, date of birth, ID band Patient awake    Reviewed: Allergy & Precautions, NPO status , Patient's Chart, lab work & pertinent test results  History of Anesthesia Complications Negative for: history of anesthetic complications  Airway Mallampati: III  TM Distance: >3 FB Neck ROM: Full    Dental  (+) Dental Advisory Given, Teeth Intact   Pulmonary asthma , former smoker,    Pulmonary exam normal        Cardiovascular hypertension, Pt. on medications Normal cardiovascular exam     Neuro/Psych  Brain tumor  negative psych ROS   GI/Hepatic negative GI ROS, Neg liver ROS,   Endo/Other   Na 129   Renal/GU negative Renal ROS     Musculoskeletal negative musculoskeletal ROS (+)   Abdominal   Peds  Hematology negative hematology ROS (+)   Anesthesia Other Findings Right basal ganglia mass, metastatic Adrenal/lung nodules   Reproductive/Obstetrics                            Anesthesia Physical Anesthesia Plan  ASA: III  Anesthesia Plan: General   Post-op Pain Management:    Induction: Intravenous  PONV Risk Score and Plan: 2 and Treatment may vary due to age or medical condition, Ondansetron, Aprepitant and Dexamethasone  Airway Management Planned: Oral ETT  Additional Equipment: Arterial line  Intra-op Plan:   Post-operative Plan: Possible Post-op intubation/ventilation  Informed Consent: I have reviewed the patients History and Physical, chart, labs and discussed the procedure including the risks, benefits and alternatives for the proposed anesthesia with the patient or authorized representative who has indicated his/her understanding and acceptance.     Dental advisory given  Plan Discussed with: CRNA and Anesthesiologist  Anesthesia Plan Comments:        Anesthesia Quick Evaluation

## 2021-03-31 NOTE — Progress Notes (Signed)
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   I discussed the results and my recs to the wife, patient and son about surgery with postop radiation and chemotherapy. They agreed to proceed with intervention. All risks, benefits and expected outcomes were discussed including but not limited to stroke, MI, infection, hemorrhage, death, blindness, weakness and the need for more surgery.   They expressed their understanding and agreed to proceed.    Elwin Sleight, DO Neurosurgeon

## 2021-03-31 NOTE — Progress Notes (Signed)
   Providing Compassionate, Quality Care - Together  NEUROSURGERY PROGRESS NOTE   S: No issues overnight.   O: EXAM:  BP 124/81 (BP Location: Right Arm)   Pulse (!) 53   Temp 97.7 F (36.5 C)   Resp 20   Ht 5\' 8"  (1.727 m)   Wt 73.5 kg   SpO2 98%   BMI 24.63 kg/m   Awake, alert, oriented  PERRL EOMI Face symmetric Speech fluent, appropriate  CNs grossly intact  5/5 BUE/BLE   ASSESSMENT:  75 y.o. male with   1.  Right basal ganglia mass, metastatic 2.  Adrenal/lung nodules -Status post adrenal mass biopsy  PLAN: -Tentatively plan for OR today for right craniotomy resection of tumor pending biopsy pathology from adrenal tumor -Oncology evaluated -N.p.o. -If prelim path is small cell.  We will cancel surgery and work on discharge, if pathology is different then surgical resection is recommended given size and location of the tumor.  I discussed all the risks, benefits and expected outcomes with the patient and his wife yesterday extensively again this morning with the patient.    Thank you for allowing me to participate in this patient's care.  Please do not hesitate to call with questions or concerns.   Elwin Sleight, [facility] Cell: [phone]

## 2021-03-31 NOTE — Telephone Encounter (Signed)
Patient's wife [name] called to let Dr. Carles Collet know the patient is in the hospital at this time getting ready to have surgery on a brain tumor.   She will call and cancel the DAT scan since it is not needed, she said.

## 2021-03-31 NOTE — Progress Notes (Signed)
   Providing Compassionate, Quality Care - Together  NEUROSURGERY PROGRESS NOTE   S: pt s/e in pacu, VSS  O: EXAM:  BP 116/63 (BP Location: Right Arm)   Pulse 70   Temp (!) 97.2 F (36.2 C)   Resp 15   Ht 5\' 8"  (1.727 m)   Wt 73.5 kg   SpO2 96%   BMI 24.63 kg/m   Awake  Communicating yes/no Face symmetric PERRL FXc4 Symmetric full strength BUE/BLE  Incision c/d/i  ASSESSMENT:  75 y.o. male with   1.  Right basal ganglia mass, metastatic 2.  Adrenal/lung nodules  -Status post adrenal mass biopsy -s/p R crani for tumor 03/31/21  PLAN: - icu -sbp control <160 -keppra -decadron -pt/ot -mri tomorrow -updated wife    Thank you for allowing me to participate in this patient's care.  Please do not hesitate to call with questions or concerns.   Elwin Sleight, [facility] Cell: [phone]

## 2021-03-31 NOTE — Progress Notes (Signed)
Patient IV was leaking and removed in LFA. Failed attempt to place IV. Report called to OR, Patient has left the unit with transport and the wife took patient's belongings and followed transport to OR.

## 2021-03-31 NOTE — Anesthesia Postprocedure Evaluation (Signed)
Anesthesia Post Note  Patient: [name].  Procedure(s) Performed: RIGHT FRONTAL CRANIOTOMY FOR TUMOR (N/A Head) APPLICATION OF CRANIAL NAVIGATION (N/A Head)     Patient location during evaluation: PACU Anesthesia Type: General Level of consciousness: awake and alert Pain management: pain level controlled Vital Signs Assessment: post-procedure vital signs reviewed and stable Respiratory status: spontaneous breathing, nonlabored ventilation, respiratory function stable and patient connected to nasal cannula oxygen Cardiovascular status: blood pressure returned to baseline and stable Postop Assessment: no apparent nausea or vomiting Anesthetic complications: no Comments: Awake and following commands, no focal deficits by gross exam, hemodynamically stable.   No complications documented.  Last Vitals:  Vitals:   03/31/21 1925 03/31/21 1940  BP: 116/63 107/60  Pulse: 70 66  Resp: 15 14  Temp:    SpO2: 96% 95%    Last Pain:  Vitals:   03/31/21 1940  TempSrc:   PainSc: 0-No pain    LLE Motor Response: Purposeful movement (03/31/21 1940) LLE Sensation: Full sensation (03/31/21 1940) RLE Motor Response: Purposeful movement (03/31/21 1940) RLE Sensation: Full sensation (03/31/21 1940)      Shannon Horton

## 2021-04-01 ENCOUNTER — Encounter (HOSPITAL_COMMUNITY): Payer: Self-pay | Admitting: Neurological Surgery

## 2021-04-01 ENCOUNTER — Inpatient Hospital Stay (HOSPITAL_COMMUNITY): Payer: Medicare PPO

## 2021-04-01 LAB — BASIC METABOLIC PANEL
Anion gap: 8 (ref 5–15)
BUN: 14 mg/dL (ref 8–23)
CO2: 25 mmol/L (ref 22–32)
Calcium: 8.8 mg/dL — ABNORMAL LOW (ref 8.9–10.3)
Chloride: 99 mmol/L (ref 98–111)
Creatinine, Ser: 0.6 mg/dL — ABNORMAL LOW (ref 0.61–1.24)
GFR, Estimated: >60 mL/min (ref >60)
Glucose, Bld: 126 mg/dL — ABNORMAL HIGH (ref 70–99)
Potassium: 3.5 mmol/L (ref 3.5–5.1)
Sodium: 132 mmol/L — ABNORMAL LOW (ref 135–145)

## 2021-04-01 LAB — CBC
HCT: 42 % (ref 39.0–52.0)
Hemoglobin: 14.1 g/dL (ref 13.0–17.0)
MCH: 31.3 pg (ref 26.0–34.0)
MCHC: 33.6 g/dL (ref 30.0–36.0)
MCV: 93.1 fL (ref 80.0–100.0)
Platelets: 198 10*3/uL (ref 150–400)
RBC: 4.51 MIL/uL (ref 4.22–5.81)
RDW: 12.3 % (ref 11.5–15.5)
WBC: 10.3 10*3/uL (ref 4.0–10.5)
nRBC: 0 % (ref 0.0–0.2)

## 2021-04-01 LAB — SURGICAL PATHOLOGY

## 2021-04-01 MED ORDER — GADOBUTROL 1 MMOL/ML IV SOLN
7.0000 mL | Freq: Once | INTRAVENOUS | Status: AC | PRN
  Administered 2021-04-01: 7 mL via INTRAVENOUS

## 2021-04-01 MED ORDER — ENALAPRILAT 1.25 MG/ML IV SOLN
1.2500 mg | Freq: Four times a day (QID) | INTRAVENOUS | Status: DC | PRN
  Administered 2021-04-04: 1.25 mg via INTRAVENOUS
  Filled 2021-04-01 (×2): qty 1

## 2021-04-01 MED FILL — Thrombin For Soln 20000 Unit: CUTANEOUS | Qty: 1 | Status: AC

## 2021-04-01 NOTE — Progress Notes (Signed)
Inpatient Rehab Admissions Coordinator Note:   Per therapy recommendations, pt was screened for CIR candidacy by Shann Medal, PT, DPT.  At this time we are recommending a CIR consult and I will place an order per our protocol.  Please contact me with questions.   Shann Medal, PT, DPT [phone] 04/01/21 4:31 PM

## 2021-04-01 NOTE — Progress Notes (Signed)
Occupational Therapy Evaluation  PTA pt modified independent with ADL and mobility and in February was biking 20 miles/day. Within the past month,pt has had more difficulty with balance and has had several falls. Currently requires mod A +2 for mobility and Mod A for ADL due to below listed deficits. Recommend CIR for rehab. Pt with very supportive family.     04/01/21 1410  OT Visit Information  Last OT Received On 04/01/21  Assistance Needed +2 (progression)  PT/OT/SLP Co-Evaluation/Treatment Yes  Reason for Co-Treatment Complexity of the patient's impairments (multi-system involvement);For patient/therapist safety;To address functional/ADL transfers  OT goals addressed during session ADL's and self-care  History of Present Illness 75 yo male presents to [facility] on 5/26 with headaches, balance issues with falls x4-6 weeks. MRI brain shows Large, 3.8 cm enhancing and hemorrhagic mass within the right basal ganglia/frontal lobe with 23mm L midline shift, concern for metastasis. CT chest/abdomen/pelvis shows L upper pulmonary nodul, L hilar adenopathy, RUL pulmonary nodule, upper quadrant mass arising from R adrenal gland, hepatic lesions. s/p CT guided biopsy R adrenal mass on 5/31 with pathology + for malignant neoplasm, oncology following. s/p R frontal craniotomy 6/1. PMH includes glaucoma, HTN, malignant melanoma 2011.  Precautions  Precautions Fall  Precaution Comments R crani  Home Living  Family/patient expects to be discharged to: Private residence  Living Arrangements Spouse/significant other  Available Help at Discharge Family;Available 24 hours/day  Type of Home House  Home Access Stairs to enter  Entrance Stairs-Number of Steps 1  Entrance Stairs-Rails Right  Home Layout Two level;Able to live on main level with bedroom/bathroom  Engineer, manufacturing systems Yes  How Accessible Accessible via walker  [place] -  2 wheels;Cane - single point;Hand held shower head  Prior Function  Level of Independence Independent with assistive device(s)  Comments pt reports using cane progressing to RW for ambulation intermittently PTA, recent history of multiple falls over the past 6 weeks. Prior to 6 weeks ago, pt biking 20+ miles on bike.  Communication  Communication No difficulties  Pain Assessment  Pain Assessment Faces (Simultaneous filing. User may not have seen previous data.)  Faces Pain Scale 4  Pain Location R side of head  Pain Descriptors / Indicators Discomfort;Grimacing;Sore  Pain Intervention(s) Limited activity within patient's tolerance;Monitored during session;Repositioned  Cognition  Arousal/Alertness Awake/alert  Behavior During Therapy Flat affect  Overall Cognitive Status Impaired/Different from baseline  Area of Impairment Attention;Memory;Safety/judgement;Awareness;Problem solving  Current Attention Level Sustained  Memory Decreased short-term memory  Safety/Judgement Decreased awareness of safety;Decreased awareness of deficits  Awareness Emergent  Problem Solving Slow processing  General Comments unaware leaning L; able to correct with cues however unable to maintain  Upper Extremity Assessment  Upper Extremity Assessment LUE deficits/detail (mild incoordination; AROM WFL; will further assess; appears to be using functionally)  Lower Extremity Assessment  Lower Extremity Assessment Defer to PT evaluation  Cervical / Trunk Assessment  Cervical / Trunk Assessment Other exceptions (L lat bias)  ADL  Overall ADL's  Needs assistance/impaired  Eating/Feeding Set up;Supervision/ safety;Sitting  Grooming Set up;Supervision/safety;Sitting  Upper Body Bathing Set up;Sitting  Lower Body Bathing Moderate assistance;Sit to/from stand  Upper Body Dressing  Minimal assistance;Sitting  Lower Body Dressing Moderate assistance;Sit to/from Retail buyer Moderate assistance;+2 for  physical assistance;Stand-pivot (took several steps)  Toileting- Clothing Manipulation and Hygiene Moderate assistance  Functional mobility during ADLs Moderate assistance;+2 for safety/equipment  Vision- History  Baseline Vision/History Wears  glasses;Glaucoma (reading)  Wears Glasses Reading only  Vision- Assessment  Vision Assessment? Vision impaired- to be further tested in functional context  Additional Comments difficulty with visual attention  Perception  Perception Tested? Yes  Perception Deficits Inattention/neglect  Inattention/Neglect Impaired- to be further tested in functional context  Comments will further assess  Praxis  Praxis-Other Comments ? motor sensory involvement LUE  Bed Mobility  Overal bed mobility Needs Assistance  Bed Mobility Supine to Sit  Supine to sit Min assist  Transfers  Overall transfer level Needs assistance  Transfers Sit to/from Stand;Stand Pivot Transfers  Sit to Stand Mod assist;+2 safety/equipment  Stand pivot transfers +2 physical assistance;Mod assist  General transfer comment posterior and L bias  Balance  Overall balance assessment Needs assistance  Sitting balance-Leahy Scale Poor  Standing balance-Leahy Scale Poor  General Comments  General comments (skin integrity, edema, etc.) Supportive family present during session  OT - End of Session  Equipment Utilized During Treatment Gait belt;Rolling walker  Activity Tolerance Patient tolerated treatment well  Patient left in chair;with call bell/phone within reach;with chair alarm set;with family/visitor present  Nurse Communication Mobility status;Other (comment) (draining surgical site)  OT Assessment  OT Recommendation/Assessment Patient needs continued OT Services  OT Visit Diagnosis Unsteadiness on feet (R26.81);Other abnormalities of gait and mobility (R26.89);Muscle weakness (generalized) (M62.81);Repeated falls (R29.6);Other symptoms and signs involving cognitive function;Pain   Pain - part of body  (head)  OT Problem List Decreased strength;Decreased activity tolerance;Impaired balance (sitting and/or standing);Impaired vision/perception;Decreased coordination;Decreased cognition;Decreased safety awareness;Decreased knowledge of use of DME or AE;Impaired UE functional use;Pain  OT Plan  OT Frequency (ACUTE ONLY) Min 2X/week  OT Treatment/Interventions (ACUTE ONLY) Self-care/ADL training;Therapeutic exercise;Neuromuscular education;DME and/or AE instruction;Therapeutic activities;Cognitive remediation/compensation;Visual/perceptual remediation/compensation;Patient/family education;Balance training  AM-PAC OT "6 Clicks" Daily Activity Outcome Measure (Version 2)  Help from another person eating meals? 3  Help from another person taking care of personal grooming? 3  Help from another person toileting, which includes using toliet, bedpan, or urinal? 2  Help from another person bathing (including washing, rinsing, drying)? 2  Help from another person to put on and taking off regular upper body clothing? 3  Help from another person to put on and taking off regular lower body clothing? 2  6 Click Score 15  OT Recommendation  Recommendations for Other Services Rehab consult  Follow Up Recommendations CIR  OT Equipment 3 in 1 bedside commode  Individuals Consulted  Consulted and Agree with Results and Recommendations Patient;Family member/caregiver  Family Member Consulted wife/son  Acute Rehab OT Goals  Patient Stated Goal to get better adn return home with fmaily  OT Goal Formulation With patient/family  Time For Goal Achievement 04/15/21  Potential to Achieve Goals Good  OT Time Calculation  OT Start Time (ACUTE ONLY) 1001  OT Stop Time (ACUTE ONLY) 1027  OT Time Calculation (min) 26 min  OT General Charges  $OT Visit 1 Visit  OT Evaluation  $OT Eval Moderate Complexity 1 Mod  Written Expression  Dominant Hand Right  Maurie Boettcher, OT/L   Acute OT Clinical  Specialist Acute Rehabilitation Services Pager [phone] Office [phone]

## 2021-04-01 NOTE — Evaluation (Signed)
Physical Therapy Evaluation Patient Details Name: Shannon Horton. MRN: [id number] DOB: 1945-12-27 Today's Date: 04/01/2021   History of Present Illness  75 yo male presents to [facility] on 5/26 with headaches, balance issues with falls x4-6 weeks. MRI brain shows Large, 3.8 cm enhancing and hemorrhagic mass within the right basal ganglia/frontal lobe with 20mm L midline shift, concern for metastasis. CT chest/abdomen/pelvis shows L upper pulmonary nodul, L hilar adenopathy, RUL pulmonary nodule, upper quadrant mass arising from R adrenal gland, hepatic lesions. s/p CT guided biopsy R adrenal mass on 5/31 with pathology + for malignant neoplasm, oncology following. s/p R frontal craniotomy 6/1. PMH includes glaucoma, HTN, malignant melanoma 2011.  Clinical Impression   Pt presents with debility, impaired balance with preference for L lateral leaning, L inattention, impaired mobility, impaired coordination, cognitive impairment vs baseline, impaired gait with Parkinsonian-like characteristics, and decreased activity tolerance vs baseline. Pt to benefit from acute PT to address deficits. Pt overall requiring min-mod assist +2 for bed mobility and transfer to recliner, pt notably with bradycardia during mobility 40s-50s bpm. PT recommending CIR to maximize pt function and independence prior to d/c home. PT to progress mobility as tolerated, and will continue to follow acutely.      Follow Up Recommendations CIR    Equipment Recommendations  None recommended by PT    Recommendations for Other Services       Precautions / Restrictions Precautions Precautions: Fall Precaution Comments: R crani Restrictions Weight Bearing Restrictions: No      Mobility  Bed Mobility Overal bed mobility: Needs Assistance Bed Mobility: Supine to Sit     Supine to sit: Min assist;HOB elevated;+2 for safety/equipment     General bed mobility comments: min assist for completion of trunk elevation off of bed,  progression LEs to EOB. Increased time, sequential cuing needed.    Transfers Overall transfer level: Needs assistance Equipment used: 2 person hand held assist Transfers: Sit to/from [facility] Sit to Stand: Mod assist;+2 physical assistance Stand pivot transfers: Mod assist;+2 physical assistance       General transfer comment: Mod +2 for power up, rise, steadying, and slow eccentric lower into recliner; cues for upright posture and "big steps" when pivoting to recliner as pt with shuffling-type gait.  Ambulation/Gait             General Gait Details: transfer only  Stairs            Wheelchair Mobility    Modified Rankin (Stroke Patients Only)       Balance Overall balance assessment: Needs assistance;History of Falls Sitting-balance support: Single extremity supported;Feet supported Sitting balance-Leahy Scale: Poor Sitting balance - Comments: heavy L lateral leaning, verbal and tactile cuing to correct repeatedly Postural control: Left lateral lean;Posterior lean Standing balance support: Bilateral upper extremity supported Standing balance-Leahy Scale: Poor Standing balance comment: reliant on bilat UE support                             Pertinent Vitals/Pain Pain Assessment: Faces (Simultaneous filing. User may not have seen previous data.) Faces Pain Scale: Hurts little more Pain Location: R side of head Pain Descriptors / Indicators: Discomfort;Grimacing;Sore Pain Intervention(s): Limited activity within patient's tolerance;Monitored during session;Repositioned    Home Living Family/patient expects to be discharged to:: Private residence Living Arrangements: Spouse/significant other Available Help at Discharge: Family;Available 24 hours/day Type of Home: House Home Access: Stairs to enter Entrance Stairs-Rails: Right Entrance  Stairs-Number of Steps: 1 Home Layout: Two level;Able to live on main level with  bedroom/bathroom Home Equipment: Gilford Rile - 2 wheels;Cane - single point;Hand held shower head      Prior Function Level of Independence: Independent with assistive device(s)         Comments: pt reports using cane progressing to RW for ambulation intermittently PTA, recent history of multiple falls over the past 6 weeks. Prior to 6 weeks ago, pt biking 20+ miles on bike.     Hand Dominance   Dominant Hand: Right    Extremity/Trunk Assessment   Upper Extremity Assessment Upper Extremity Assessment: Defer to OT evaluation    Lower Extremity Assessment Lower Extremity Assessment: Generalized weakness (at least 3+/5 throughout bilat. + incoordination as assessed via heel-to-shin test)    Cervical / Trunk Assessment Cervical / Trunk Assessment: Kyphotic  Communication   Communication: No difficulties  Cognition Arousal/Alertness: Awake/alert Behavior During Therapy: Flat affect Overall Cognitive Status: Impaired/Different from baseline Area of Impairment: Attention;Following commands;Safety/judgement;Problem solving                   Current Attention Level: Sustained Memory: Decreased short-term memory Following Commands: Follows one step commands with increased time Safety/Judgement: Decreased awareness of safety;Decreased awareness of deficits Awareness: Emergent Problem Solving: Slow processing;Decreased initiation;Difficulty sequencing;Requires verbal cues;Requires tactile cues General Comments: Pt with increased processing time to initate tasks and verbally respond to PT/OT. Pt with heavy L lateral leaning sitting EOB, requires repeated cuing to both be aware of and correct this. Step-by-step cuing for mobility tasks      General Comments General comments (skin integrity, edema, etc.): Pt bradycardic during mobility 47-60 bpm, RN notified    Exercises     Assessment/Plan    PT Assessment Patient needs continued PT services  PT Problem List Decreased  strength;Decreased mobility;Decreased safety awareness;Decreased activity tolerance;Decreased balance;Decreased knowledge of use of DME;Decreased cognition;Pain;Cardiopulmonary status limiting activity;Decreased knowledge of precautions       PT Treatment Interventions DME instruction;Therapeutic activities;Gait training;Therapeutic exercise;Patient/family education;Balance training;Stair training;Functional mobility training;Neuromuscular re-education    PT Goals (Current goals can be found in the Care Plan section)  Acute Rehab PT Goals Patient Stated Goal: to get better and return home with family PT Goal Formulation: With patient/family Time For Goal Achievement: 04/15/21 Potential to Achieve Goals: Good    Frequency Min 4X/week   Barriers to discharge        Co-evaluation PT/OT/SLP Co-Evaluation/Treatment: Yes Reason for Co-Treatment: Complexity of the patient's impairments (multi-system involvement);For patient/therapist safety;To address functional/ADL transfers PT goals addressed during session: Mobility/safety with mobility;Balance OT goals addressed during session: ADL's and self-care       AM-PAC PT "6 Clicks" Mobility  Outcome Measure Help needed turning from your back to your side while in a flat bed without using bedrails?: A Little Help needed moving from lying on your back to sitting on the side of a flat bed without using bedrails?: A Lot Help needed moving to and from a bed to a chair (including a wheelchair)?: A Lot Help needed standing up from a chair using your arms (e.g., wheelchair or bedside chair)?: A Lot Help needed to walk in hospital room?: A Lot Help needed climbing 3-5 steps with a railing? : Total 6 Click Score: 12    End of Session Equipment Utilized During Treatment: Gait belt Activity Tolerance: Patient tolerated treatment well;Patient limited by fatigue Patient left: in chair;with chair alarm set;with call bell/phone within reach;with  family/visitor present Nurse Communication:  Mobility status;Other (comment) (bradycardia with mobility) PT Visit Diagnosis: Other abnormalities of gait and mobility (R26.89);Difficulty in walking, not elsewhere classified (R26.2)    Time: 5894-8347 PT Time Calculation (min) (ACUTE ONLY): 38 min   Charges:   PT Evaluation $PT Eval Moderate Complexity: 1 Mod          Buryl Bamber S, PT DPT Acute Rehabilitation Services Pager [phone]  Office [phone]   [place] E Ruffin Pyo 04/01/2021, 2:25 PM

## 2021-04-01 NOTE — Progress Notes (Signed)
Foley cath removed at this time without complications. 190cc clear yellow urine noted prior to removal. Patient educated with good understanding verbalized. Will monitor for void.

## 2021-04-01 NOTE — Telephone Encounter (Signed)
Close encounter 

## 2021-04-01 NOTE — Progress Notes (Signed)
Oncology Metastatic carcinoma: Awaiting pathology final report on the adrenal biopsy. Status postcraniotomy 03/31/2021: Awaiting the pathology on that as well. Further recommendations and follow-up plans will be made based upon the pathology results.

## 2021-04-01 NOTE — Progress Notes (Signed)
Subjective: Patient reports no headache  Objective: Vital signs in last 24 hours: Temp:  [97 F (36.1 C)-97.9 F (36.6 C)] 97.4 F (36.3 C) (06/02 1100) Pulse Rate:  [45-72] 48 (06/02 1100) Resp:  [11-21] 13 (06/02 1100) BP: (107-153)/(60-74) 114/63 (06/02 1100) SpO2:  [94 %-100 %] 97 % (06/02 1100) Arterial Line BP: (127-165)/(58-88) 144/73 (06/02 0900) Weight:  [70.5 kg] 70.5 kg (06/01 2000)  Intake/Output from previous day: 06/01 0701 - 06/02 0700 In: 1900 [I.V.:1500; IV Piggyback:400] Out: 1110 [Urine:1100; Blood:10] Intake/Output this shift: Total I/O In: 498.6 [I.V.:348.6; IV Piggyback:150] Out: 190 [Urine:190]  Awake, alert, Ox2 FC x4, no drift Dressing c/d  Lab Results: Recent Labs    03/31/21 2100 04/01/21 0604  WBC 11.5* 10.3  HGB 14.3 14.1  HCT 40.3 42.0  PLT 233 198   BMET Recent Labs    03/30/21 2319 03/31/21 2100 04/01/21 0604  NA 129*  --  132*  K 3.6  --  3.5  CL 94*  --  99  CO2 25  --  25  GLUCOSE 118*  --  126*  BUN 17  --  14  CREATININE 0.72 0.73 0.60*  CALCIUM 9.0  --  8.8*    Studies/Results: No results found.  Assessment/Plan: S/p R crani for resection of basal ganglia mass - MRI today - Keppra - cont ICU for now   Shannon Horton 04/01/2021, 12:15 PM

## 2021-04-02 LAB — SURGICAL PATHOLOGY

## 2021-04-02 MED ORDER — DEXAMETHASONE 4 MG PO TABS
4.0000 mg | ORAL_TABLET | Freq: Three times a day (TID) | ORAL | Status: AC
  Administered 2021-04-02 – 2021-04-03 (×3): 4 mg via ORAL
  Filled 2021-04-02 (×3): qty 1

## 2021-04-02 MED ORDER — DEXAMETHASONE 4 MG PO TABS
2.0000 mg | ORAL_TABLET | Freq: Two times a day (BID) | ORAL | Status: AC
  Administered 2021-04-04 – 2021-04-05 (×3): 2 mg via ORAL
  Filled 2021-04-02 (×3): qty 1

## 2021-04-02 MED ORDER — DEXAMETHASONE 0.5 MG PO TABS: 1.0000 mg | ORAL_TABLET | Freq: Every day | ORAL | Status: DC

## 2021-04-02 MED ORDER — POLYETHYLENE GLYCOL 3350 17 G PO PACK
17.0000 g | PACK | Freq: Every day | ORAL | Status: DC
  Administered 2021-04-02 – 2021-04-06 (×5): 17 g via ORAL
  Filled 2021-04-02 (×4): qty 1

## 2021-04-02 MED ORDER — DEXAMETHASONE 4 MG PO TABS
2.0000 mg | ORAL_TABLET | Freq: Three times a day (TID) | ORAL | Status: AC
  Administered 2021-04-03 – 2021-04-04 (×3): 2 mg via ORAL
  Filled 2021-04-02 (×3): qty 1

## 2021-04-02 MED ORDER — DEXAMETHASONE 0.5 MG PO TABS
1.0000 mg | ORAL_TABLET | Freq: Two times a day (BID) | ORAL | Status: DC
  Administered 2021-04-06: 1 mg via ORAL
  Filled 2021-04-02 (×2): qty 2

## 2021-04-02 NOTE — Progress Notes (Signed)
Pt transferred from 4N ICU to 4NP RM 1. Pt one assist from wheelchair to recliner. A&O X4, vital signs stable. Pt restin comfortably call bell within reach. Pt belongings at bedside to include cell phone and eyeglasses.

## 2021-04-02 NOTE — Progress Notes (Signed)
Subjective: Patient reports no headache  Objective: Vital signs in last 24 hours: Temp:  [97 F (36.1 C)-98 F (36.7 C)] 97.4 F (36.3 C) (06/03 1246) Pulse Rate:  [45-68] 64 (06/03 1246) Resp:  [10-16] 13 (06/03 1246) BP: (95-140)/(28-101) 127/59 (06/03 1246) SpO2:  [90 %-98 %] 97 % (06/03 1246)  Intake/Output from previous day: 06/02 0701 - 06/03 0700 In: 1861.6 [P.O.:360; I.V.:1251.6; IV Piggyback:250] Out: 1090 [Urine:1090] Intake/Output this shift: Total I/O In: 337.3 [I.V.:237.3; IV Piggyback:100] Out: 675 [Urine:675]  Awake, alert, Ox2-3 FC x 4, no drift Dressing intact  Lab Results: Recent Labs    03/31/21 2100 04/01/21 0604  WBC 11.5* 10.3  HGB 14.3 14.1  HCT 40.3 42.0  PLT 233 198   BMET Recent Labs    03/30/21 2319 03/31/21 2100 04/01/21 0604  NA 129*  --  132*  K 3.6  --  3.5  CL 94*  --  99  CO2 25  --  25  GLUCOSE 118*  --  126*  BUN 17  --  14  CREATININE 0.72 0.73 0.60*  CALCIUM 9.0  --  8.8*    Studies/Results: MR BRAIN W WO CONTRAST  Result Date: 04/01/2021 CLINICAL DATA:  Postop day 1 status post resection of a right basal ganglia region mass. Recent adrenal biopsy demonstrating metastatic melanoma. EXAM: MRI HEAD WITHOUT AND WITH CONTRAST TECHNIQUE: Multiplanar, multiecho pulse sequences of the brain and surrounding structures were obtained without and with intravenous contrast. CONTRAST:  43mL GADAVIST GADOBUTROL 1 MMOL/ML IV SOLN COMPARISON:  03/26/2021 FINDINGS: Brain: Sequelae of interval right frontal craniotomy and mass resection are identified. There is a resection cavity in the right basal ganglia containing blood products with surgical tract extending through the anterolateral frontal lobe. There is a small amount of partially nodular intrinsic T1 hyperintensity along the margins of the resection cavity suggesting blood products. Faint enhancement is also present along the periphery of the resection cavity. There is a 1.8 cm region of  intensely restricted diffusion immediately superior to the resection cavity involving portions of the right corona radiata and caudate nucleus compatible with an acute infarct. Small volume pneumocephalus is noted, and there is a small extra-axial fluid collection over the right frontal convexity subjacent to the craniotomy measuring 7 mm in thickness. There is moderate residual edema throughout the right basal ganglia region extending into the frontal greater than temporal lobes which has decreased from the prior study. 5 mm of leftward midline shift is unchanged. A 5 mm lesion in the right caudate head demonstrating both intrinsic T1 hyperintensity and enhancement is unchanged (series 10, image 33). The smaller enhancing focus in the right caudate nucleus slightly more inferiorly on the prior MRI is less conspicuous on today's study. There is 6 mm heterogeneously enhancing nodular focus in the choroid plexus in the left lateral ventricle at the level of the inferior atrium/posterior temporal horn (series 10, image 26) which is asymmetric compared to the contralateral side. This is unchanged in size from the prior MRI, however there is new susceptibility artifact associated with this lesion consistent with hemorrhage, and there is new trace intraventricular hemorrhage layering in the occipital horn of the left lateral ventricle. Scattered small T2 hyperintensities in the cerebral white matter bilaterally are nonspecific but compatible with mild chronic small vessel ischemic disease. There is mild cerebral atrophy. Vascular: Major intracranial vascular flow voids are preserved. Skull and upper cervical spine: Right frontal craniotomy with gas and fluid in the scalp. Sinuses/Orbits: Bilateral cataract  extraction. Paranasal sinuses and mastoid air cells are clear. Other: None. IMPRESSION: 1. Postoperative changes from interval right basal ganglia tumor resection as above. This will serve as a baseline for future  examinations. 2. 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 3. Decreased edema in the right basal ganglia region. Unchanged 5 mm a midline shift. 4. Unchanged 5 mm right caudate lesion. 5. 6 mm nodular hemorrhagic lesion in the choroid plexus of the left lateral ventricle suspicious for a metastasis with trace intraventricular hemorrhage. Electronically Signed   By: Logan Bores M.D.   On: 04/01/2021 15:09    Assessment/Plan: S/p resection of right basal ganglia metastasis - f/u path - awaiting CIR - dex taper   [name] 04/02/2021, 2:24 PM

## 2021-04-02 NOTE — Progress Notes (Signed)
Oncology Pathology from the adrenal biopsy revealed metastatic carcinoma unknown primary.  Immunohistochemical stains are positive for S100, HMB45, Melan-A and SOX 10 We are still awaiting the results of brain surgical tissue pathology. Pathology did not conclude the origin of this tumor but it does appear to be consistent with melanoma in my opinion. Will await to see the results  from the brain surgery before planning further treatments.  The next step in his treatment would be palliative radiation to the brain after he recovers from the surgery. I might get Dr. Alen Blew, our melanoma expert to follow the patient in the outpatient setting.

## 2021-04-02 NOTE — Progress Notes (Signed)
Physical Therapy Treatment Patient Details Name: Shannon Horton. MRN: [id number] DOB: Oct 29, 1946 Today's Date: 04/02/2021    History of Present Illness 75 yo male presents to [facility] on 5/26 with headaches, balance issues with falls x4-6 weeks. MRI brain shows Large, 3.8 cm enhancing and hemorrhagic mass within the right basal ganglia/frontal lobe with 84mm L midline shift, concern for metastasis. CT chest/abdomen/pelvis shows L upper pulmonary nodul, L hilar adenopathy, RUL pulmonary nodule, upper quadrant mass arising from R adrenal gland, hepatic lesions. s/p CT guided biopsy R adrenal mass on 5/31 with pathology + for malignant neoplasm, oncology following. s/p R frontal craniotomy 6/1. PMH includes glaucoma, HTN, malignant melanoma 2011.    PT Comments    Pt making excellent progress today.  Ambulating hallway distances with min A and frequent cues for posture and RW proximity.  Does still have decreased safety awareness and balance - will continue to benefit from PT. Continue to recommend CIR to maximize independence.     Follow Up Recommendations  CIR     Equipment Recommendations  None recommended by PT    Recommendations for Other Services       Precautions / Restrictions Precautions Precautions: Fall Precaution Comments: R crani; L iniattention    Mobility  Bed Mobility Overal bed mobility: Needs Assistance Bed Mobility: Supine to Sit     Supine to sit: Supervision;HOB elevated     General bed mobility comments: in chair    Transfers Overall transfer level: Needs assistance Equipment used: Rolling walker (2 wheeled) Transfers: Sit to/from Stand Sit to Stand: Min assist         General transfer comment: Min A to steady  Ambulation/Gait Ambulation/Gait assistance: Min guard Gait Distance (Feet): 150 Feet Assistive device: Rolling walker (2 wheeled) Gait Pattern/deviations: Step-through pattern;Decreased stride length;Trunk flexed Gait velocity:  decreased   General Gait Details: Cues for posture and RW proximity.  Pt is kyphotic baseline but could improve posture with cues. Frequent cues for RW proximity.   Stairs             Wheelchair Mobility    Modified Rankin (Stroke Patients Only)       Balance Overall balance assessment: Needs assistance;History of Falls Sitting-balance support: Feet supported Sitting balance-Leahy Scale: Good     Standing balance support: No upper extremity supported;Bilateral upper extremity supported Standing balance-Leahy Scale: Fair Standing balance comment: static stand without AD; RW to amb                            Cognition Arousal/Alertness: Awake/alert Behavior During Therapy: WFL for tasks assessed/performed Overall Cognitive Status: Impaired/Different from baseline Area of Impairment: Attention;Following commands;Safety/judgement;Problem solving                   Current Attention Level: Selective Memory: Decreased recall of precautions;Decreased short-term memory Following Commands: Follows one step commands consistently Safety/Judgement: Decreased awareness of safety;Decreased awareness of deficits Awareness: Emergent Problem Solving: Slow processing;Decreased initiation;Difficulty sequencing;Requires verbal cues;Requires tactile cues General Comments: continues to demosntrate L inattention      Exercises      General Comments General comments (skin integrity, edema, etc.): VSS      Pertinent Vitals/Pain Pain Assessment: No/denies pain Faces Pain Scale: Hurts a little bit Pain Location: R side of head Pain Descriptors / Indicators: Discomfort;Grimacing;Sore Pain Intervention(s): Limited activity within patient's tolerance    Home Living  Prior Function            PT Goals (current goals can now be found in the care plan section) Acute Rehab PT Goals Patient Stated Goal: to get better and return home with  family; return to cycling PT Goal Formulation: With patient/family Time For Goal Achievement: 04/15/21 Potential to Achieve Goals: Good Progress towards PT goals: Progressing toward goals    Frequency    Min 4X/week      PT Plan Current plan remains appropriate    Co-evaluation              AM-PAC PT "6 Clicks" Mobility   Outcome Measure  Help needed turning from your back to your side while in a flat bed without using bedrails?: A Little Help needed moving from lying on your back to sitting on the side of a flat bed without using bedrails?: A Little Help needed moving to and from a bed to a chair (including a wheelchair)?: A Little Help needed standing up from a chair using your arms (e.g., wheelchair or bedside chair)?: A Little Help needed to walk in hospital room?: A Little Help needed climbing 3-5 steps with a railing? : A Little 6 Click Score: 18    End of Session Equipment Utilized During Treatment: Gait belt Activity Tolerance: Patient tolerated treatment well Patient left: in chair;with chair alarm set;with call bell/phone within reach Nurse Communication: Mobility status PT Visit Diagnosis: Other abnormalities of gait and mobility (R26.89);Difficulty in walking, not elsewhere classified (R26.2)     Time: 0272-5366 PT Time Calculation (min) (ACUTE ONLY): 20 min  Charges:  $Gait Training: 8-22 mins                     Abran Richard, PT Acute Rehab Services Pager [phone] [facility] 04/02/2021, 3:00 PM

## 2021-04-02 NOTE — Consult Note (Signed)
Physical Medicine and Rehabilitation Consult Reason for Consult:balance problems. Left sided weakness Referring Physician: Dawley   HPI: Shannon Horton. is a 75 y.o. male who presented to [facility] on May 26 with headaches and balance deficits for 4 to 6 weeks.  MRI revealed a 3.8 cm enhancing and hemorrhagic mass in the right basal ganglia/frontal lobe region with 5 mm left midline shift.  CT of the chest and abdomen was notable for left upper pulmonary nodule, left hilar adenopathy right upper lobe pulmonary nodule left lower quadrant mass arising from the right adrenal gland, hepatic lesions.  CT-guided biopsy of the adrenal mass on 5/31 was positive for malignancy.  Oncology is following.  Patient underwent a right frontal craniotomy on 6/1.   ROS Past Medical History:  Diagnosis Date  . Allergic rhinitis    gets shots per Dr. Velora Heckler  . Allergy    sees Dr. Harold Hedge   . Asthma   . Glaucoma    sees Dr. Satira Sark   . Hypertension   . Malignant melanoma ([place])    right lower leg, diagnosed on1/21/11   Past Surgical History:  Procedure Laterality Date  . APPLICATION OF CRANIAL NAVIGATION N/A 03/31/2021   Procedure: APPLICATION OF CRANIAL NAVIGATION;  Surgeon: Dawley, Theodoro Doing, DO;  Location: [place];  Service: Neurosurgery;  Laterality: N/A;  . CATARACT EXTRACTION     right ey, Dr. Satira Sark   . COLONOSCOPY  12/15/2015   per Dr.Kaplan, clear, no repeats   . CRANIOTOMY N/A 03/31/2021   Procedure: RIGHT FRONTAL CRANIOTOMY FOR TUMOR;  Surgeon: Dawley, Theodoro Doing, DO;  Location: [place];  Service: Neurosurgery;  Laterality: N/A;  . ESOPHAGOGASTRODUODENOSCOPY     WITH ESOPHAGEAL DILATION 2006 PER dR. kAPLAN  . REMOVAL  of melanoma  12/08/09   from right calf; per Dr. Monica Becton  . removal of melanoma from left abdomen  01/21/10    per Dr. Meredith Leeds  . SQUAMOUS CELL CARCINOMA EXCISION  2013   per Dr. Terrence Dupont, from top of scalp    Family History  Problem Relation Age of Onset   . Hypertension Other   . Colon polyps Mother   . Hypertension Mother   . Diabetes Father   . Liver disease Maternal Uncle   . Cancer Brother        throat and lung (smoker)  . Colon cancer Neg Hx   . Stomach cancer Neg Hx   . Esophageal cancer Neg Hx   . Pancreatic cancer Neg Hx    Social History:  reports that he has quit smoking. He has never used smokeless tobacco. He reports current alcohol use of about 5.0 - 10.0 standard drinks of alcohol per week. He reports that he does not use drugs. Allergies:  Allergies  Allergen Reactions  . Banana Rash   Medications Prior to Admission  Medication Sig Dispense Refill  . acetaminophen (TYLENOL) 500 MG tablet Take 1,000 mg by mouth every 6 (six) hours as needed for mild pain.    Marland Kitchen albuterol (PROVENTIL HFA;VENTOLIN HFA) 108 (90 Base) MCG/ACT inhaler Inhale 2 puffs into the lungs every 4 (four) hours as needed for wheezing or shortness of breath. 8.5 Inhaler 0  . amLODipine (NORVASC) 5 MG tablet Take 1 tablet (5 mg total) by mouth daily. 90 tablet 3  . aspirin 325 MG EC tablet Take 325 mg by mouth every 6 (six) hours as needed for pain (alt with ibuprofen).    Marland Kitchen  aspirin 81 MG EC tablet Take 81 mg by mouth daily.    . bimatoprost (LUMIGAN) 0.03 % ophthalmic solution Place 1 drop into both eyes at bedtime.    . brinzolamide (AZOPT) 1 % ophthalmic suspension Place 1 drop into both eyes 2 (two) times daily.    . Coenzyme Q10 200 MG capsule Take 200 mg by mouth daily.    Marland Kitchen EPINEPHrine 0.3 mg/0.3 mL IJ SOAJ injection Inject 0.3 mg into the muscle See admin instructions.    . Fluticasone Furoate (ARNUITY ELLIPTA) 200 MCG/ACT AEPB Inhale 1 puff into the lungs daily.    Marland Kitchen ibuprofen (ADVIL) 200 MG tablet Take 200-400 mg by mouth every 6 (six) hours as needed for headache.    . Multiple Vitamin (MULTIVITAMIN) tablet Take 1 tablet by mouth daily. One A Day    . telmisartan-hydrochlorothiazide (MICARDIS HCT) 80-25 MG tablet TAKE 1 TABLET BY MOUTH EVERY  DAY 90 tablet 3  . UNABLE TO FIND Allergy shots at [facility] every month      Home: [place] expects to be discharged to:: Private residence Living Arrangements: Spouse/significant other Available Help at Discharge: Family,Available 24 hours/day Type of Home: House Home Access: Stairs to enter [employer] of Steps: 1 Entrance Stairs-Rails: Right Home Layout: Two level,Able to live on main level with bedroom/bathroom Bathroom Shower/Tub: Multimedia programmer: Standard Bathroom Accessibility: Yes Home Equipment: Environmental consultant - 2 wheels,Cane - single point,Hand held shower head  Functional History: Prior Function Level of Independence: Independent with assistive device(s) Comments: pt reports using cane progressing to RW for ambulation intermittently PTA, recent history of multiple falls over the past 6 weeks. Prior to 6 weeks ago, pt biking 20+ miles on bike. Functional Status:  Mobility: Bed Mobility Overal bed mobility: Needs Assistance Bed Mobility: Supine to Sit Supine to sit: Min assist,HOB elevated,+2 for safety/equipment General bed mobility comments: min assist for completion of trunk elevation off of bed, progression LEs to EOB. Increased time, sequential cuing needed. Transfers Overall transfer level: Needs assistance Equipment used: 2 person hand held assist Transfers: Sit to/from [place] to Stand: Mod assist,+2 physical assistance Stand pivot transfers: Mod assist,+2 physical assistance General transfer comment: Mod +2 for power up, rise, steadying, and slow eccentric lower into recliner; cues for upright posture and "big steps" when pivoting to recliner as pt with shuffling-type gait. Ambulation/Gait General Gait Details: transfer only    ADL: ADL Overall ADL's : Needs assistance/impaired Eating/Feeding: Set up,Supervision/ safety,Sitting Grooming: Set  up,Supervision/safety,Sitting Upper Body Bathing: Set up,Sitting Lower Body Bathing: Moderate assistance,Sit to/from stand Upper Body Dressing : Minimal assistance,Sitting Lower Body Dressing: Moderate assistance,Sit to/from stand Toilet Transfer: Moderate assistance,+2 for physical assistance,Stand-pivot (took several steps) Toileting- Clothing Manipulation and Hygiene: Moderate assistance Functional mobility during ADLs: Moderate assistance,+2 for safety/equipment  Cognition: Cognition Overall Cognitive Status: Impaired/Different from baseline Orientation Level: Oriented X4 Cognition Arousal/Alertness: Awake/alert Behavior During Therapy: Flat affect Overall Cognitive Status: Impaired/Different from baseline Area of Impairment: Attention,Following commands,Safety/judgement,Problem solving Current Attention Level: Sustained Memory: Decreased short-term memory Following Commands: Follows one step commands with increased time Safety/Judgement: Decreased awareness of safety,Decreased awareness of deficits Awareness: Emergent Problem Solving: Slow processing,Decreased initiation,Difficulty sequencing,Requires verbal cues,Requires tactile cues General Comments: Pt with increased processing time to initate tasks and verbally respond to PT/OT. Pt with heavy L lateral leaning sitting EOB, requires repeated cuing to both be aware of and correct this. Step-by-step cuing for mobility tasks  Blood pressure (!) 127/59, pulse  64, temperature (!) 97.4 F (36.3 C), temperature source Oral, resp. rate 13, height 5\' 8"  (1.727 m), weight 70.5 kg, SpO2 97 %. Physical Exam  No results found for this or any previous visit (from the past 24 hour(s)). MR BRAIN W WO CONTRAST  Result Date: 04/01/2021 CLINICAL DATA:  Postop day 1 status post resection of a right basal ganglia region mass. Recent adrenal biopsy demonstrating metastatic melanoma. EXAM: MRI HEAD WITHOUT AND WITH CONTRAST TECHNIQUE: Multiplanar,  multiecho pulse sequences of the brain and surrounding structures were obtained without and with intravenous contrast. CONTRAST:  6mL GADAVIST GADOBUTROL 1 MMOL/ML IV SOLN COMPARISON:  03/26/2021 FINDINGS: Brain: Sequelae of interval right frontal craniotomy and mass resection are identified. There is a resection cavity in the right basal ganglia containing blood products with surgical tract extending through the anterolateral frontal lobe. There is a small amount of partially nodular intrinsic T1 hyperintensity along the margins of the resection cavity suggesting blood products. Faint enhancement is also present along the periphery of the resection cavity. There is a 1.8 cm region of intensely restricted diffusion immediately superior to the resection cavity involving portions of the right corona radiata and caudate nucleus compatible with an acute infarct. Small volume pneumocephalus is noted, and there is a small extra-axial fluid collection over the right frontal convexity subjacent to the craniotomy measuring 7 mm in thickness. There is moderate residual edema throughout the right basal ganglia region extending into the frontal greater than temporal lobes which has decreased from the prior study. 5 mm of leftward midline shift is unchanged. A 5 mm lesion in the right caudate head demonstrating both intrinsic T1 hyperintensity and enhancement is unchanged (series 10, image 33). The smaller enhancing focus in the right caudate nucleus slightly more inferiorly on the prior MRI is less conspicuous on today's study. There is 6 mm heterogeneously enhancing nodular focus in the choroid plexus in the left lateral ventricle at the level of the inferior atrium/posterior temporal horn (series 10, image 26) which is asymmetric compared to the contralateral side. This is unchanged in size from the prior MRI, however there is new susceptibility artifact associated with this lesion consistent with hemorrhage, and there is new  trace intraventricular hemorrhage layering in the occipital horn of the left lateral ventricle. Scattered small T2 hyperintensities in the cerebral white matter bilaterally are nonspecific but compatible with mild chronic small vessel ischemic disease. There is mild cerebral atrophy. Vascular: Major intracranial vascular flow voids are preserved. Skull and upper cervical spine: Right frontal craniotomy with gas and fluid in the scalp. Sinuses/Orbits: Bilateral cataract extraction. Paranasal sinuses and mastoid air cells are clear. Other: None. IMPRESSION: 1. Postoperative changes from interval right basal ganglia tumor resection as above. This will serve as a baseline for future examinations. 2. 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 3. Decreased edema in the right basal ganglia region. Unchanged 5 mm a midline shift. 4. Unchanged 5 mm right caudate lesion. 5. 6 mm nodular hemorrhagic lesion in the choroid plexus of the left lateral ventricle suspicious for a metastasis with trace intraventricular hemorrhage. Electronically Signed   By: Logan Bores M.D.   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 Primary carcinoma is unknown. 1. Does the need for close, 24 hr/day medical supervision in concert with the patient's rehab needs make it unreasonable for this patient to be served in a less intensive setting?  Yes 2. Co-Morbidities requiring supervision/potential complications: pain mgt, oncology considerations, HTN 3. Due to bladder management, bowel management, safety, skin/wound care, disease management, medication administration, pain management and patient education, does the patient require 24 hr/day rehab nursing? Yes 4. Does the patient require coordinated care of a physician, rehab nurse, therapy disciplines of PT, OT , SLP to address physical and functional deficits in  the context of the above medical diagnosis(es)? Yes Addressing deficits in the following areas: balance, endurance, locomotion, strength, transferring, bowel/bladder control, bathing, dressing, feeding, grooming, toileting and psychosocial support 5. Can the patient actively participate in an intensive therapy program of at least 3 hrs of therapy per day at least 5 days per week? Yes 6. The potential for patient to make measurable gains while on inpatient rehab is good 7. Anticipated functional outcomes upon discharge from inpatient rehab are supervision  with PT, supervision with OT, supervision with SLP. 8. Estimated rehab length of stay to reach the above functional goals is: 13-20 days 9. Anticipated discharge destination: Home 10. Overall Rehab/Functional Prognosis: good  RECOMMENDATIONS: This patient's condition is appropriate for continued rehabilitative care in the following setting: CIR Patient has agreed to participate in recommended program. Potentially Note that insurance prior authorization may be required for reimbursement for recommended care.  Comment: [name], MD, [facility] 04/02/2021    [name], MD 04/02/2021

## 2021-04-02 NOTE — Progress Notes (Signed)
Occupational Therapy Treatment Patient Details Name: Shannon Horton. MRN: [id number] DOB: 18-Dec-1945 Today's Date: 04/02/2021    History of present illness 75 yo male [facility] on 5/26 with headaches, balance issues with falls x4-6 weeks. MRI brain shows Large, 3.8 cm enhancing and hemorrhagic mass within the right basal ganglia/frontal lobe with 48mm L midline shift, concern for metastasis. CT chest/abdomen/pelvis shows L upper pulmonary nodul, L hilar adenopathy, RUL pulmonary nodule, upper quadrant mass arising from R adrenal gland, hepatic lesions. s/p CT guided biopsy R adrenal mass on 5/31 with pathology + for malignant neoplasm, oncology following. s/p R frontal craniotomy 6/1. PMH includes glaucoma, HTN, malignant melanoma 2011.   OT comments  Pt demonstrates improvement as compared to evaluation yesterday however continues to require assistance for mobility and ADL tasks @ RW level due to deficits listed below. High fall risk due to L inattention (dragging L foot during ambulation).  Feel rehab at CIR will maximize functional level of independence and facilitate safe DC home with family. Will continue to follow acutely.  Follow Up Recommendations  CIR    Equipment Recommendations  3 in 1 bedside commode    Recommendations for Other Services Rehab consult    Precautions / Restrictions Precautions Precautions: Fall Precaution Comments: R crani; L iniattention       Mobility Bed Mobility Overal bed mobility: Needs Assistance Bed Mobility: Supine to Sit     Supine to sit: Supervision;HOB elevated          Transfers Overall transfer level: Needs assistance   Transfers: Sit to/from Stand Sit to Stand: Min assist              Balance     Sitting balance-Leahy Scale: Fair       Standing balance-Leahy Scale: Poor Standing balance comment: reliant on bilat UE support                           ADL either performed or assessed with clinical  judgement   ADL       Grooming: Set up;Supervision/safety;Sitting               Lower Body Dressing: Moderate assistance;Sit to/from stand       Toileting- Water quality scientist and Hygiene: Moderate assistance               Vision   Additional Comments: visual inattention   Perception     Praxis      Cognition Arousal/Alertness: Awake/alert Behavior During Therapy: Flat affect Overall Cognitive Status: Impaired/Different from baseline Area of Impairment: Attention;Following commands;Safety/judgement;Problem solving                   Current Attention Level: Selective Memory: Decreased recall of precautions;Decreased short-term memory Following Commands: Follows one step commands consistently Safety/Judgement: Decreased awareness of safety;Decreased awareness of deficits Awareness: Emergent Problem Solving: Slow processing;Decreased initiation;Difficulty sequencing;Requires verbal cues;Requires tactile cues General Comments: continues to demosntrate L inattention        Exercises     Shoulder Instructions       General Comments dragging L foot during mobility    Pertinent Vitals/ Pain       Pain Assessment: Faces Faces Pain Scale: Hurts a little bit Pain Location: R side of head Pain Descriptors / Indicators: Discomfort;Grimacing;Sore Pain Intervention(s): Limited activity within patient's tolerance  Home Living  Prior Functioning/Environment              Frequency  Min 2X/week        Progress Toward Goals  OT Goals(current goals can now be found in the care plan section)  Progress towards OT goals: Progressing toward goals  Acute Rehab OT Goals Patient Stated Goal: to get better and return home with family OT Goal Formulation: With patient/family Time For Goal Achievement: 04/15/21 Potential to Achieve Goals: Good ADL Goals Pt Will Perform Grooming: with  supervision;standing Pt Will Perform Lower Body Bathing: with supervision;sit to/from stand Pt Will Perform Lower Body Dressing: with supervision;sit to/from stand Pt Will Transfer to Toilet: with supervision;ambulating Additional ADL Goal #1: Pt will identify 5/5 targets in L side of moderately distracting environment  Plan Discharge plan remains appropriate    Co-evaluation                 AM-PAC OT "6 Clicks" Daily Activity     Outcome Measure   Help from another person eating meals?: A Little Help from another person taking care of personal grooming?: A Little Help from another person toileting, which includes using toliet, bedpan, or urinal?: A Lot Help from another person bathing (including washing, rinsing, drying)?: A Lot Help from another person to put on and taking off regular upper body clothing?: A Little Help from another person to put on and taking off regular lower body clothing?: A Lot 6 Click Score: 15    End of Session Equipment Utilized During Treatment: Gait belt;Rolling walker  OT Visit Diagnosis: Unsteadiness on feet (R26.81);Other abnormalities of gait and mobility (R26.89);Muscle weakness (generalized) (M62.81);Repeated falls (R29.6);Other symptoms and signs involving cognitive function;Pain Pain - part of body:  (head)   Activity Tolerance Patient tolerated treatment well   Patient Left Other (comment) (transport chair)   Nurse Communication Mobility status        Time: 1610-9604 OT Time Calculation (min): 16 min  Charges: OT General Charges $OT Visit: 1 Visit OT Treatments $Self Care/Home Management : 8-22 mins  Maurie Boettcher, OT/L   Acute OT Clinical Specialist [place] Pager [phone] Office [phone]    [facility] 04/02/2021, 1:55 PM

## 2021-04-03 MED ORDER — PANTOPRAZOLE SODIUM 40 MG PO TBEC
40.0000 mg | DELAYED_RELEASE_TABLET | Freq: Every day | ORAL | Status: DC
  Administered 2021-04-03 – 2021-04-05 (×3): 40 mg via ORAL
  Filled 2021-04-03 (×3): qty 1

## 2021-04-03 MED ORDER — LEVETIRACETAM 500 MG PO TABS
500.0000 mg | ORAL_TABLET | Freq: Two times a day (BID) | ORAL | Status: DC
  Administered 2021-04-03 – 2021-04-06 (×6): 500 mg via ORAL
  Filled 2021-04-03 (×6): qty 1

## 2021-04-03 NOTE — Progress Notes (Signed)
Inpatient Rehab Admissions:  Inpatient Rehab Consult received.  I met with patient at the bedside for rehabilitation assessment and to discuss goals and expectations of an inpatient rehab admission.  Pt acknowledged understanding of goals and expectations. Pt interested in pursuing CIR. Pt gave permission to contact wife, [name].  She also acknowledged understanding of CIR goals and expectations.  She is interested in pt pursuing CIR.  Will continue to follow.  Signed: Gayland Curry, [place], [place] Admissions Coordinator [phone]

## 2021-04-03 NOTE — PMR Pre-admission (Signed)
PMR Admission Coordinator Pre-Admission Assessment  Patient: Shannon Horton. is an 75 y.o., male MRN: [id number] DOB: Nov 07, 1945 Height: _0  (172.7 cm) Weight: 70.5 kg              Insurance Information HMO:     PPO: yes     PCP:      IPA:      80/20:      OTHER:  PRIMARY: [employer] CHO      Policy#: [id number]      Subscriber: patient CM Name: Shannon Horton      Phone#: [phone] ext [phone]     Fax#: [phone] Pre-Cert#: [id number] Received approval from Dawson Bills. Pt approved for 5 days beginning 04/06/21     Employer:  Benefits:  Phone #: n/a-online at [email or web address]     Name:  Eff. Date: 11/01/19-still active     Deduct: does not have Out of Pocket Max: $3,300 ($508.59 met)      Life Max: NA  CIR: $125/day co-pay with max co-pay of $1,250/admission (10 days)      SNF: 100% Outpatient: $20/visit co-pay     Co-Pay:  Home Health: 100%      Co-Pay:  DME: 80%     Co-Pay: 20% Providers: in-network SECONDARY:       Policy#:       Phone#:   Development worker, community:       Phone#:   [employer] Engineer, [employer]" for patients in Inpatient Rehabilitation Facilities with attached "Privacy Act [place] Records" was provided and verbally reviewed with: Patient and Family  Emergency Contact Information Contact Information    Name Relation Home Work Mobile   Horton,Shannon Spouse [phone]       Current Medical History  Patient Admitting Diagnosis: s/p resection of R basal ganglia metastasis  History of Present Illness:   Shannon Horton is a 75 year old right-handed male history of glaucoma followed by Dr. Satira Sark, hypertension, tobacco use, remote  melanoma x2 removed from his right calf and left flank 2011 with negative nodal biopsy.   Independent with assistive device and reported multiple falls over the past 6 weeks.  Prior to 6 weeks ago patient independent and active.  Presented 03/25/2021 with persistent headaches balance deficits x6 weeks, left  hemiparesis as well as shuffling gait.  MRI of the brain showed a 3.8 cm enhancing and hemorrhagic mass in the right basal ganglia/frontal lobe region with a 5 mm left midline shift.  CT of the chest and abdomen was notable for left upper pulmonary nodule, left hilar adenopathy right upper lobe pulmonary nodule left lower quadrant mass arising from the right adrenal gland, hepatic lesions.  CT-guided biopsy of the adrenal mass 03/30/2021 positive for malignancy.  Patient underwent right craniotomy resection of tumor 03/31/2021 per Dr. Reatha Armour.  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  Brain surgical tissue pathology remains pending with follow-up per oncology services Dr. Lindi Adie as well as Dr. Alen Blew..  Decadron protocol as indicated.  Maintained on Keppra for seizure prophylaxis.  Patient was cleared to begin subcutaneous heparin for DVT prophylaxis 04/02/2021.  Tolerating a regular consistency diet.  Therapy   Past Medical History  Past Medical History:  Diagnosis Date  . Allergic rhinitis    gets shots per Dr. Velora Heckler  . Allergy    sees Dr. Harold Hedge   . Asthma   . Glaucoma    sees Dr. Satira Sark   . Hypertension   . Malignant melanoma ([place])  right lower leg, diagnosed on1/21/11    Family History  family history includes Cancer in his brother; Colon polyps in his mother; Diabetes in his father; Hypertension in his mother and another family member; Liver disease in his maternal uncle.  Prior Rehab/Hospitalizations:  Has the patient had prior rehab or hospitalizations prior to admission? No  Has the patient had major surgery during 100 days prior to admission? Yes  Current Medications   Current Facility-Administered Medications:  .  0.9 %  sodium chloride infusion, , Intravenous, Continuous, Dawley, Troy C, DO, Last Rate: 50 mL/hr at 04/02/21 1939, New Bag at 04/02/21 1939 .  acetaminophen (TYLENOL) tablet 650 mg, 650 mg, Oral, Q6H PRN, 650 mg at 04/01/21 0942 **OR**  acetaminophen (TYLENOL) suppository 650 mg, 650 mg, Rectal, Q6H PRN, Dawley, Troy C, DO .  albuterol (PROVENTIL) (2.5 MG/3ML) 0.083% nebulizer solution 3 mL, 3 mL, Inhalation, Q4H PRN, Dawley, Troy C, DO, 3 mL at 03/30/21 2348 .  ALPRAZolam Duanne Moron) tablet 0.25 mg, 0.25 mg, Oral, TID PRN, Dawley, Troy C, DO, 0.25 mg at 04/04/21 2138 .  amLODipine (NORVASC) tablet 5 mg, 5 mg, Oral, Daily, Dawley, Troy C, DO, 5 mg at 04/04/21 1051 .  brinzolamide (AZOPT) 1 % ophthalmic suspension 1 drop, 1 drop, Both Eyes, BID, Dawley, Troy C, DO, 1 drop at 04/04/21 2136 .  Chlorhexidine Gluconate Cloth 2 % PADS 6 each, 6 each, Topical, Daily, Dawley, Troy C, DO, 6 each at 04/03/21 1005 .  [COMPLETED] dexamethasone (DECADRON) tablet 4 mg, 4 mg, Oral, Q8H, 4 mg at 04/03/21 1539 **FOLLOWED BY** [COMPLETED] dexamethasone (DECADRON) tablet 2 mg, 2 mg, Oral, Q8H, 2 mg at 04/04/21 1402 **FOLLOWED BY** dexamethasone (DECADRON) tablet 2 mg, 2 mg, Oral, Q12H, 2 mg at 04/05/21 1004 **FOLLOWED BY** [START ON 04/06/2021] dexamethasone (DECADRON) tablet 1 mg, 1 mg, Oral, Q12H **FOLLOWED BY** [START ON 04/08/2021] dexamethasone (DECADRON) tablet 1 mg, 1 mg, Oral, Daily, [name], MD .  docusate sodium (COLACE) capsule 100 mg, 100 mg, Oral, BID, Dawley, Troy C, DO, 100 mg at 04/05/21 1005 .  enalaprilat (VASOTEC) injection 1.25 mg, 1.25 mg, Intravenous, Q6H PRN, [name], MD, 1.25 mg at 04/04/21 1050 .  famotidine (PEPCID) tablet 20 mg, 20 mg, Oral, Daily, Dawley, Troy C, DO, 20 mg at 04/05/21 1004 .  heparin injection 5,000 Units, 5,000 Units, Subcutaneous, Q12H, Dawley, Troy C, DO, 5,000 Units at 04/05/21 1005 .  irbesartan (AVAPRO) tablet 300 mg, 300 mg, Oral, Daily, 300 mg at 04/04/21 1050 **AND** hydrochlorothiazide (HYDRODIURIL) tablet 25 mg, 25 mg, Oral, Daily, Dawley, Troy C, DO, 25 mg at 04/04/21 1050 .  HYDROcodone-acetaminophen (NORCO/VICODIN) 5-325 MG per tablet 1 tablet, 1 tablet, Oral, Q4H PRN, Dawley, Troy  C, DO, 1 tablet at 04/04/21 2139 .  labetalol (NORMODYNE) injection 10-40 mg, 10-40 mg, Intravenous, Q10 min PRN, Dawley, Troy C, DO .  latanoprost (XALATAN) 0.005 % ophthalmic solution 1 drop, 1 drop, Both Eyes, QHS, Dawley, Troy C, DO, 1 drop at 04/04/21 2135 .  levETIRAcetam (KEPPRA) tablet 500 mg, 500 mg, Oral, BID, Dawley, Troy C, DO, 500 mg at 04/05/21 1005 .  morphine 2 MG/ML injection 1-2 mg, 1-2 mg, Intravenous, Q2H PRN, Dawley, Troy C, DO, 2 mg at 04/01/21 0058 .  ondansetron (ZOFRAN) tablet 4 mg, 4 mg, Oral, Q6H PRN **OR** ondansetron (ZOFRAN) injection 4 mg, 4 mg, Intravenous, Q6H PRN, Dawley, Troy C, DO, 4 mg at 03/31/21 1848 .  pantoprazole (PROTONIX) EC tablet 40 mg, 40  mg, Oral, QHS, Dawley, Troy C, DO, 40 mg at 04/04/21 2138 .  phentolamine (REGITINE) injection 10 mg, 10 mg, Intravenous, Once PRN, Dawley, Troy C, DO .  polyethylene glycol (MIRALAX / GLYCOLAX) packet 17 g, 17 g, Oral, Daily, Ashok Pall, MD, 17 g at 04/05/21 1004 .  promethazine (PHENERGAN) tablet 12.5-25 mg, 12.5-25 mg, Oral, Q4H PRN, Dawley, Troy C, DO  Patients Current Diet:  Diet Order            Diet regular Room service appropriate? Yes with Assist; Fluid consistency: Thin  Diet effective now                 Precautions / Restrictions Precautions Precautions: Fall Precaution Comments: R crani; L inattention Restrictions Weight Bearing Restrictions: No   Has the patient had 2 or more falls or a fall with injury in the past year?Yes  Prior Activity Level Community (5-7x/wk): wife drives recently but takes pt with her  Prior Functional Level Prior Function Level of Independence: Independent with assistive device(s) Comments: pt reports using cane progressing to RW for ambulation intermittently PTA, recent history of multiple falls over the past 6 weeks. Prior to 6 weeks ago, pt biking 20+ miles on bike.  Self Care: Did the patient need help bathing, dressing, using the toilet or eating?   Independent  Indoor Mobility: Did the patient need assistance with walking from room to room (with or without device)? Independent  Stairs: Did the patient need assistance with internal or external stairs (with or without device)? Independent  Functional Cognition: Did the patient need help planning regular tasks such as shopping or remembering to take medications? Independent  Home Assistive Devices / Equipment Home Equipment: Walker - 2 wheels,Cane - single point,Hand held shower head  Prior Device Use: Indicate devices/aids used by the patient prior to current illness, exacerbation or injury? cane  Current Functional Level Cognition  Overall Cognitive Status: Impaired/Different from baseline Current Attention Level: Selective Orientation Level: Oriented X4 Following Commands: Follows one step commands consistently Safety/Judgement: Decreased awareness of safety,Decreased awareness of deficits General Comments: Decreased ability to monitor for self fatigue    Extremity Assessment (includes Sensation/Coordination)  Upper Extremity Assessment: Defer to OT evaluation  Lower Extremity Assessment: Generalized weakness (at least 3+/5 throughout bilat. + incoordination as assessed via heel-to-shin test)    ADLs  Overall ADL's : Needs assistance/impaired Eating/Feeding: Set up,Supervision/ safety,Sitting Grooming: Set up,Supervision/safety,Sitting Upper Body Bathing: Set up,Sitting Lower Body Bathing: Moderate assistance,Sit to/from stand Upper Body Dressing : Minimal assistance,Sitting Lower Body Dressing: Moderate assistance,Sit to/from stand Toilet Transfer: Moderate assistance,+2 for physical assistance,Stand-pivot (took several steps) Toileting- Clothing Manipulation and Hygiene: Moderate assistance Functional mobility during ADLs: Moderate assistance,+2 for safety/equipment    Mobility  Overal bed mobility: Needs Assistance Bed Mobility: Supine to Sit Supine to sit:  Supervision,HOB elevated General bed mobility comments: OOB in chair    Transfers  Overall transfer level: Needs assistance Equipment used: Rolling walker (2 wheeled) Transfers: Sit to/from Stand Sit to Stand: Min guard Stand pivot transfers: Mod assist,+2 physical assistance General transfer comment: Cues for scooting forward to edge of seat, hand placement to stand from recliner surface    Ambulation / Gait / Stairs / Wheelchair Mobility  Ambulation/Gait Ambulation/Gait assistance: Min Web designer (Feet): 120 Feet Assistive device: Rolling walker (2 wheeled) Gait Pattern/deviations: Step-through pattern,Decreased stride length,Trunk flexed,Shuffle General Gait Details: Mod-max cues for posture and RW proximity and increased foot clearance. Pt unable to monitor for self fatigue, with  decreasing foot clearance with distance. MinA for balance and cues for sequencing/direction Gait velocity: decreased Gait velocity interpretation: <1.8 ft/sec, indicate of risk for recurrent falls    Posture / Balance Dynamic Sitting Balance Sitting balance - Comments: heavy L lateral leaning, verbal and tactile cuing to correct repeatedly Balance Overall balance assessment: Needs assistance,History of Falls Sitting-balance support: Feet supported Sitting balance-Leahy Scale: Good Sitting balance - Comments: heavy L lateral leaning, verbal and tactile cuing to correct repeatedly Postural control: Left lateral lean,Posterior lean Standing balance support: Bilateral upper extremity supported,During functional activity Standing balance-Leahy Scale: Poor Standing balance comment: reliant on BUE support    Special needs/care consideration Continuous Drip IV  0.9% sodium chloride infusion: 42m/hr, Skin Surgical incision: head/right and Designated visitor ATiara Bartoli wife     Previous Home Environment  Living Arrangements: Spouse/significant other  Lives With: Spouse Available Help at  Discharge: Family,Available 24 hours/day Type of Home: House Home Layout: One level,Other (Comment) (bonus room above garage) Home Access: Stairs to enter Entrance Stairs-Rails: None ETechnical brewerof Steps: 1 Bathroom Shower/Tub: [place]mbulance person Standard Bathroom Accessibility: Yes How Accessible: Accessible via walker Home Care Services: No  Discharge Living Setting Plans for Discharge Living Setting: Patient's home Type of Home at Discharge: House Discharge Home Layout: One level,Other (Comment) (bonus room above garage) Discharge Home Access: Stairs to enter Entrance Stairs-Rails: None Entrance Stairs-Number of Steps: 1 Discharge Bathroom Shower/Tub: Walk-in shower,Tub/shower unit Discharge Bathroom Toilet: Standard Discharge Bathroom Accessibility: Yes How Accessible: Accessible via walker Does the patient have any problems obtaining your medications?: No  Social/Family/Support Systems Anticipated Caregiver: ABlain Hunsucker wife Anticipated Caregiver's Contact Information: [phone] Availability: 24/7 Discharge Plan Discussed with Primary Caregiver: Yes Is Caregiver In Agreement with Plan?: Yes Does Caregiver/Family have Issues with Lodging/Transportation while Pt is in Rehab?: No  Goals Patient/Family Goal for Rehab: Supervision: PT/OT/ST Expected length of stay: 13-20 days Pt/Family Agrees to Admission and willing to participate: Yes Program Orientation Provided & Reviewed with Pt/Caregiver Including Roles  & Responsibilities: Yes  Decrease burden of Care through IP rehab admission: NA  Possible need for SNF placement upon discharge:Not anticipated  Patient Condition: This patient's medical and functional status has changed since the consult dated: 04/02/21 in which the Rehabilitation Physician determined and documented that the patient's condition is appropriate for intensive rehabilitative care in an inpatient  rehabilitation facility. See "History of Present Illness" (above) for medical update. Functional changes are: Pt currently Min G with transfers, Min A 120' with gait, and Mod A with ADLs. Patient's medical and functional status update has been discussed with the Rehabilitation physician and patient remains appropriate for inpatient rehabilitation. Will admit to inpatient rehab today  Preadmission Screen Completed By:  LBethel Born CCC-SLP, 04/05/2021 3:40 PM ______________________________________________________________________   Discussed status with Dr. SNaaman Plummeron 04/06/2021 at  1012 and received approval for admission today.  Admission Coordinator:  LBethel Born time [id number] 04/06/2021

## 2021-04-03 NOTE — Progress Notes (Signed)
Subjective: Patient reports no headaches, no acute events overnight. Up and walking around in the room this morning.   Objective: Vital signs in last 24 hours: Temp:  [97.4 F (36.3 C)-98.6 F (37 C)] 97.7 F (36.5 C) (06/04 0714) Pulse Rate:  [45-97] 69 (06/04 0714) Resp:  [10-16] 13 (06/04 0714) BP: (92-140)/(46-74) 117/61 (06/04 0714) SpO2:  [94 %-100 %] 96 % (06/04 0714)  Intake/Output from previous day: 06/03 0701 - 06/04 0700 In: 337.3 [I.V.:237.3; IV Piggyback:100] Out: 975 [Urine:975] Intake/Output this shift: No intake/output data recorded.  Neurologic: Grossly normal  Lab Results: Lab Results  Component Value Date   WBC 10.3 04/01/2021   HGB 14.1 04/01/2021   HCT 42.0 04/01/2021   MCV 93.1 04/01/2021   PLT 198 04/01/2021   Lab Results  Component Value Date   INR 1.0 03/29/2021   BMET Lab Results  Component Value Date   NA 132 (L) 04/01/2021   K 3.5 04/01/2021   CL 99 04/01/2021   CO2 25 04/01/2021   GLUCOSE 126 (H) 04/01/2021   BUN 14 04/01/2021   CREATININE 0.60 (L) 04/01/2021   CALCIUM 8.8 (L) 04/01/2021    Studies/Results: MR BRAIN W WO CONTRAST  Result Date: 04/01/2021 CLINICAL DATA:  Postop day 1 status post resection of a right basal ganglia region mass. Recent adrenal biopsy demonstrating metastatic melanoma. EXAM: MRI HEAD WITHOUT AND WITH CONTRAST TECHNIQUE: Multiplanar, multiecho pulse sequences of the brain and surrounding structures were obtained without and with intravenous contrast. CONTRAST:  9mL GADAVIST GADOBUTROL 1 MMOL/ML IV SOLN COMPARISON:  03/26/2021 FINDINGS: Brain: Sequelae of interval right frontal craniotomy and mass resection are identified. There is a resection cavity in the right basal ganglia containing blood products with surgical tract extending through the anterolateral frontal lobe. There is a small amount of partially nodular intrinsic T1 hyperintensity along the margins of the resection cavity suggesting blood products.  Faint enhancement is also present along the periphery of the resection cavity. There is a 1.8 cm region of intensely restricted diffusion immediately superior to the resection cavity involving portions of the right corona radiata and caudate nucleus compatible with an acute infarct. Small volume pneumocephalus is noted, and there is a small extra-axial fluid collection over the right frontal convexity subjacent to the craniotomy measuring 7 mm in thickness. There is moderate residual edema throughout the right basal ganglia region extending into the frontal greater than temporal lobes which has decreased from the prior study. 5 mm of leftward midline shift is unchanged. A 5 mm lesion in the right caudate head demonstrating both intrinsic T1 hyperintensity and enhancement is unchanged (series 10, image 33). The smaller enhancing focus in the right caudate nucleus slightly more inferiorly on the prior MRI is less conspicuous on today's study. There is 6 mm heterogeneously enhancing nodular focus in the choroid plexus in the left lateral ventricle at the level of the inferior atrium/posterior temporal horn (series 10, image 26) which is asymmetric compared to the contralateral side. This is unchanged in size from the prior MRI, however there is new susceptibility artifact associated with this lesion consistent with hemorrhage, and there is new trace intraventricular hemorrhage layering in the occipital horn of the left lateral ventricle. Scattered small T2 hyperintensities in the cerebral white matter bilaterally are nonspecific but compatible with mild chronic small vessel ischemic disease. There is mild cerebral atrophy. Vascular: Major intracranial vascular flow voids are preserved. Skull and upper cervical spine: Right frontal craniotomy with gas and fluid in the  scalp. Sinuses/Orbits: Bilateral cataract extraction. Paranasal sinuses and mastoid air cells are clear. Other: None. IMPRESSION: 1. Postoperative changes  from interval right basal ganglia tumor resection as above. This will serve as a baseline for future examinations. 2. 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 3. Decreased edema in the right basal ganglia region. Unchanged 5 mm a midline shift. 4. Unchanged 5 mm right caudate lesion. 5. 6 mm nodular hemorrhagic lesion in the choroid plexus of the left lateral ventricle suspicious for a metastasis with trace intraventricular hemorrhage. Electronically Signed   By: Logan Bores M.D.   On: 04/01/2021 15:09    Assessment/Plan: Doing well, s/p crani for tumor resection (melanoma per path report). He is waiting for CIR placement.    LOS: 9 days    Ocie Cornfield Signe Tackitt 04/03/2021, 8:31 AM

## 2021-04-04 NOTE — Progress Notes (Signed)
NEUROSURGERY PROGRESS NOTE  S/p crani for tumor. Doing well. No headaches and no acute event overnight.  Rehab working on Insurance underwriter. Awaiting CIR placement Continue therapies today.   Temp:  [97.6 F (36.4 C)-98.6 F (37 C)] 97.6 F (36.4 C) (06/05 0700) Pulse Rate:  [41-61] 44 (06/05 0700) Resp:  [10-16] 13 (06/05 0700) BP: (108-127)/(60-73) 118/66 (06/05 0700) SpO2:  [96 %-98 %] 96 % (06/05 0700)   Shannon Chiquito, NP 04/04/2021 8:18 AM

## 2021-04-05 NOTE — Progress Notes (Signed)
Occupational Therapy Treatment Patient Details Name: Shannon Horton. MRN: [id number] DOB: November 05, 1945 Today's Date: 04/05/2021    History of present illness 75 yo male presents to [facility] on 5/26 with headaches, balance issues with falls x4-6 weeks. MRI brain shows Large, 3.8 cm enhancing and hemorrhagic mass within the right basal ganglia/frontal lobe with 76mm L midline shift, concern for metastasis. CT chest/abdomen/pelvis shows L upper pulmonary nodul, L hilar adenopathy, RUL pulmonary nodule, upper quadrant mass arising from R adrenal gland, hepatic lesions. s/p CT guided biopsy R adrenal mass on 5/31 with pathology + for malignant neoplasm, oncology following. s/p R frontal craniotomy 6/1. PMH includes glaucoma, HTN, malignant melanoma 2011.   OT comments  Pt making steady progress towards OT goals this session. Pt continues to present with balance impairments, and impaired safety awareness. Pt currently requires MIN A- min guard assist for household distance functional mobility with RW, LOB noted in bathroom needing MIN A to correct. Pt able to complete toileting with MIN A but noted to impulsively stand from toilet without assist. Pt currently requires up to MOD A for UB ADLs and min guard for standing ADLs at sink. Pt would greatly benefit from intensity and structure of CIR level therapy to address balance deficits as pt with high prior level of function reporting that he is used to biking ~ 20 miles a day and enjoys other hobbies such as kayaking and row boating. Pt would continue to benefit from skilled occupational therapy while admitted and after d/c to address the below listed limitations in order to improve overall functional mobility and facilitate independence with BADL participation. DC plan remains appropriate, will follow acutely per POC.     Follow Up Recommendations  CIR    Equipment Recommendations  3 in 1 bedside commode    Recommendations for Other Services       Precautions / Restrictions Precautions Precautions: Fall Precaution Comments: R crani; L inattention Restrictions Weight Bearing Restrictions: No       Mobility Bed Mobility Overal bed mobility: Modified Independent Bed Mobility: Supine to Sit     Supine to sit: Modified independent (Device/Increase time)     General bed mobility comments: no physcial assist needed, use of bed features    Transfers Overall transfer level: Needs assistance Equipment used: Rolling walker (2 wheeled) Transfers: Sit to/from Stand Sit to Stand: Min assist         General transfer comment: posterior lean noted upon initial stand but improved as session progressed, pt impulsively stood from toilet with no physcial assist    Balance Overall balance assessment: Needs assistance;History of Falls Sitting-balance support: Feet supported Sitting balance-Leahy Scale: Good     Standing balance support: Single extremity supported;No upper extremity supported;During functional activity Standing balance-Leahy Scale: Poor Standing balance comment: initial posterior lean with standing, one LOB in bathroom needing physical assist to correct                           ADL either performed or assessed with clinical judgement   ADL Overall ADL's : Needs assistance/impaired     Grooming: Wash/dry hands;Standing;Min guard;Supervision/safety           Upper Body Dressing : Moderate assistance;Sitting Upper Body Dressing Details (indicate cue type and reason): to don new gown     Toilet Transfer: Minimal assistance;RW;Ambulation;Regular Toilet;Grab bars;Cueing for safety Toilet Transfer Details (indicate cue type and reason): MIN a for balance when ambulating to  BR, LOB in BR needing MIN A to correct, cues for safety awareness         Functional mobility during ADLs: Minimal assistance;Min guard;Rolling walker;Cueing for safety General ADL Comments: pt continues to present with  impaired balance and impaired safety awareness     Vision Baseline Vision/History: Wears glasses;Glaucoma Wears Glasses: Reading only Vision Assessment?: Vision impaired- to be further tested in functional context Additional Comments: pt reports no visual deficits, no inattention noted this session but will continue to assess   Perception     Praxis      Cognition Arousal/Alertness: Awake/alert Behavior During Therapy: [facility] for tasks assessed/performed;Impulsive Overall Cognitive Status: Impaired/Different from baseline Area of Impairment: Attention;Safety/judgement;Problem solving                   Current Attention Level: Selective Memory: Decreased recall of precautions;Decreased short-term memory   Safety/Judgement: Decreased awareness of safety;Decreased awareness of deficits Awareness: Emergent Problem Solving: Slow processing;Decreased initiation;Difficulty sequencing;Requires verbal cues;Requires tactile cues General Comments: minimal awareness noted to LOB, cues for safety awareness as pt impulsively sit<>stand from toilet despite education on waiting for OTA to assist pt        Exercises     Shoulder Instructions       General Comments VSS on RA    Pertinent Vitals/ Pain       Pain Assessment: No/denies pain  Home Living                                          Prior Functioning/Environment              Frequency  Min 2X/week        Progress Toward Goals  OT Goals(current goals can now be found in the care plan section)  Progress towards OT goals: Progressing toward goals  Acute Rehab OT Goals Patient Stated Goal: return to hobbies OT Goal Formulation: With patient Time For Goal Achievement: 04/15/21 Potential to Achieve Goals: Good  Plan Discharge plan remains appropriate;Frequency remains appropriate    Co-evaluation                 AM-PAC OT "6 Clicks" Daily Activity     Outcome Measure   Help from  another person eating meals?: A Little Help from another person taking care of personal grooming?: A Little Help from another person toileting, which includes using toliet, bedpan, or urinal?: A Little Help from another person bathing (including washing, rinsing, drying)?: A Little Help from another person to put on and taking off regular upper body clothing?: None Help from another person to put on and taking off regular lower body clothing?: A Little 6 Click Score: 19    End of Session Equipment Utilized During Treatment: Rolling walker  OT Visit Diagnosis: Unsteadiness on feet (R26.81);Other abnormalities of gait and mobility (R26.89);Muscle weakness (generalized) (M62.81);Repeated falls (R29.6);Other symptoms and signs involving cognitive function   Activity Tolerance Patient tolerated treatment well   Patient Left in chair;with call bell/phone within reach;Other (comment) (PT entering to work with pt)   Nurse Communication Mobility status        Time: [phone] OT Time Calculation (min): 24 min  Charges: OT General Charges $OT Visit: 1 Visit OT Treatments $Self Care/Home Management : 23-37 mins Shannon Alto., COTA/L Acute Rehabilitation Services [phone]    Shannon Horton 04/05/2021, 3:59 PM

## 2021-04-05 NOTE — Progress Notes (Signed)
Subjective: Patient reports no headache  Objective: Vital signs in last 24 hours: Temp:  [97.5 F (36.4 C)-98.8 F (37.1 C)] 98.8 F (37.1 C) (06/06 1100) Pulse Rate:  [46-53] 53 (06/05 1930) Resp:  [10-12] 10 (06/05 1930) BP: (107-135)/(59-68) 107/65 (06/06 1100) SpO2:  [91 %-98 %] 91 % (06/06 0700)  Intake/Output from previous day: 06/05 0701 - 06/06 0700 In: 360 [P.O.:360] Out: 1800 [Urine:1800] Intake/Output this shift: Total I/O In: 120 [P.O.:120] Out: 225 [Urine:225]  Awake, alert, Ox3 FC x 4 Dressing removed. C/d, with some clotted blood on mid-incision  Lab Results: No results for input(s): WBC, HGB, HCT, PLT in the last 72 hours. BMET No results for input(s): NA, K, CL, CO2, GLUCOSE, BUN, CREATININE, CALCIUM in the last 72 hours.  Studies/Results: No results found.  Assessment/Plan: S/p resection of basal ganglia met - awaiting CIR   [name] 04/05/2021, 12:56 PM

## 2021-04-05 NOTE — Progress Notes (Signed)
Inpatient Rehab Admissions Coordinator:  Saw pt and wife at bedside. Informed them that we are awaiting insurance authorization.  Will continue to follow.   Gayland Curry, [place], [place] Admissions Coordinator [phone]

## 2021-04-05 NOTE — Progress Notes (Signed)
Physical Therapy Treatment Patient Details Name: Shannon Horton. MRN: [id number] DOB: 04/02/1946 Today's Date: 04/05/2021    History of Present Illness 75 yo male presents to [facility] on 5/26 with headaches, balance issues with falls x4-6 weeks. MRI brain shows Large, 3.8 cm enhancing and hemorrhagic mass within the right basal ganglia/frontal lobe with 59mm L midline shift, concern for metastasis. CT chest/abdomen/pelvis shows L upper pulmonary nodul, L hilar adenopathy, RUL pulmonary nodule, upper quadrant mass arising from R adrenal gland, hepatic lesions. s/p CT guided biopsy R adrenal mass on 5/31 with pathology + for malignant neoplasm, oncology following. s/p R frontal craniotomy 6/1. PMH includes glaucoma, HTN, malignant melanoma 2011.    PT Comments    Pt progressing towards his physical therapy goals; denies headache. Session focused on continued gait training and dynamic standing balance reaching task to promote left sided attention. Pt ambulating x 120 feet with a walker at a min assist level. Demonstrates slow, shuffling gait speed with kyphotic posture. Presents as a high fall risk based on decreased gait speed and safety awareness. Continue to recommend comprehensive inpatient rehab (CIR) for post-acute therapy needs.    Follow Up Recommendations  CIR     Equipment Recommendations  None recommended by PT    Recommendations for Other Services       Precautions / Restrictions Precautions Precautions: Fall Precaution Comments: R crani; L inattention Restrictions Weight Bearing Restrictions: No    Mobility  Bed Mobility               General bed mobility comments: OOB in chair    Transfers Overall transfer level: Needs assistance Equipment used: Rolling walker (2 wheeled) Transfers: Sit to/from Stand Sit to Stand: Min guard         General transfer comment: Cues for scooting forward to edge of seat, hand placement to stand from recliner  surface  Ambulation/Gait Ambulation/Gait assistance: Min assist Gait Distance (Feet): 120 Feet Assistive device: Rolling walker (2 wheeled) Gait Pattern/deviations: Step-through pattern;Decreased stride length;Trunk flexed;Shuffle Gait velocity: decreased Gait velocity interpretation: <1.8 ft/sec, indicate of risk for recurrent falls General Gait Details: Mod-max cues for posture and RW proximity and increased foot clearance. Pt unable to monitor for self fatigue, with decreasing foot clearance with distance. MinA for balance and cues for sequencing/direction   Stairs             Wheelchair Mobility    Modified Rankin (Stroke Patients Only)       Balance Overall balance assessment: Needs assistance;History of Falls Sitting-balance support: Feet supported Sitting balance-Leahy Scale: Good     Standing balance support: Bilateral upper extremity supported;During functional activity Standing balance-Leahy Scale: Poor Standing balance comment: reliant on BUE support                            Cognition Arousal/Alertness: Awake/alert Behavior During Therapy: WFL for tasks assessed/performed Overall Cognitive Status: Impaired/Different from baseline Area of Impairment: Attention;Safety/judgement;Problem solving                   Current Attention Level: Selective Memory: Decreased recall of precautions;Decreased short-term memory   Safety/Judgement: Decreased awareness of safety;Decreased awareness of deficits Awareness: Emergent Problem Solving: Slow processing;Decreased initiation;Difficulty sequencing;Requires verbal cues;Requires tactile cues General Comments: Decreased ability to monitor for self fatigue      Exercises      General Comments        Pertinent Vitals/Pain Pain Assessment: No/denies  pain    Home Living                      Prior Function            PT Goals (current goals can now be found in the care plan  section) Acute Rehab PT Goals Patient Stated Goal: to get better and return home with family; return to cycling Potential to Achieve Goals: Good Progress towards PT goals: Progressing toward goals    Frequency    Min 4X/week      PT Plan Current plan remains appropriate    Co-evaluation              AM-PAC PT "6 Clicks" Mobility   Outcome Measure  Help needed turning from your back to your side while in a flat bed without using bedrails?: A Little Help needed moving from lying on your back to sitting on the side of a flat bed without using bedrails?: A Little Help needed moving to and from a bed to a chair (including a wheelchair)?: A Little Help needed standing up from a chair using your arms (e.g., wheelchair or bedside chair)?: A Little Help needed to walk in hospital room?: A Little Help needed climbing 3-5 steps with a railing? : A Lot 6 Click Score: 17    End of Session Equipment Utilized During Treatment: Gait belt Activity Tolerance: Patient tolerated treatment well Patient left: in chair;with call bell/phone within reach;with chair alarm set Nurse Communication: Mobility status PT Visit Diagnosis: Other abnormalities of gait and mobility (R26.89);Difficulty in walking, not elsewhere classified (R26.2)     Time: 7628-3151 PT Time Calculation (min) (ACUTE ONLY): 19 min  Charges:  $Gait Training: 8-22 mins                     Wyona Almas, PT, DPT Acute Rehabilitation Services Pager [phone] Office [phone]    Deno Etienne 04/05/2021, 3:00 PM

## 2021-04-06 ENCOUNTER — Inpatient Hospital Stay (HOSPITAL_COMMUNITY)
Admission: RE | Admit: 2021-04-06 | Discharge: 2021-04-18 | DRG: 949 | Disposition: A | Discharge status: Other Acute Inpatient Hospital | Payer: Medicare PPO | Source: Intra-hospital | Attending: Physical Medicine & Rehabilitation | Admitting: Physical Medicine & Rehabilitation

## 2021-04-06 ENCOUNTER — Encounter (HOSPITAL_COMMUNITY): Payer: Self-pay | Admitting: Physical Medicine & Rehabilitation

## 2021-04-06 ENCOUNTER — Other Ambulatory Visit: Payer: Self-pay

## 2021-04-06 DIAGNOSIS — E871 Hypo-osmolality and hyponatremia: Secondary | ICD-10-CM | POA: Diagnosis not present

## 2021-04-06 DIAGNOSIS — R911 Solitary pulmonary nodule: Secondary | ICD-10-CM | POA: Diagnosis not present

## 2021-04-06 DIAGNOSIS — Z801 Family history of malignant neoplasm of trachea, bronchus and lung: Secondary | ICD-10-CM

## 2021-04-06 DIAGNOSIS — Z91018 Allergy to other foods: Secondary | ICD-10-CM

## 2021-04-06 DIAGNOSIS — G4700 Insomnia, unspecified: Secondary | ICD-10-CM | POA: Diagnosis not present

## 2021-04-06 DIAGNOSIS — J189 Pneumonia, unspecified organism: Secondary | ICD-10-CM | POA: Diagnosis not present

## 2021-04-06 DIAGNOSIS — Z8249 Family history of ischemic heart disease and other diseases of the circulatory system: Secondary | ICD-10-CM | POA: Diagnosis not present

## 2021-04-06 DIAGNOSIS — Z87891 Personal history of nicotine dependence: Secondary | ICD-10-CM | POA: Diagnosis not present

## 2021-04-06 DIAGNOSIS — F32A Depression, unspecified: Secondary | ICD-10-CM | POA: Diagnosis not present

## 2021-04-06 DIAGNOSIS — H409 Unspecified glaucoma: Secondary | ICD-10-CM | POA: Diagnosis present

## 2021-04-06 DIAGNOSIS — R4182 Altered mental status, unspecified: Secondary | ICD-10-CM | POA: Diagnosis not present

## 2021-04-06 DIAGNOSIS — Y95 Nosocomial condition: Secondary | ICD-10-CM | POA: Diagnosis not present

## 2021-04-06 DIAGNOSIS — Z483 Aftercare following surgery for neoplasm: Principal | ICD-10-CM

## 2021-04-06 DIAGNOSIS — C7931 Secondary malignant neoplasm of brain: Secondary | ICD-10-CM | POA: Diagnosis not present

## 2021-04-06 DIAGNOSIS — D496 Neoplasm of unspecified behavior of brain: Secondary | ICD-10-CM

## 2021-04-06 DIAGNOSIS — Z8371 Family history of colonic polyps: Secondary | ICD-10-CM

## 2021-04-06 DIAGNOSIS — I10 Essential (primary) hypertension: Secondary | ICD-10-CM

## 2021-04-06 DIAGNOSIS — E559 Vitamin D deficiency, unspecified: Secondary | ICD-10-CM | POA: Diagnosis present

## 2021-04-06 DIAGNOSIS — G8194 Hemiplegia, unspecified affecting left nondominant side: Secondary | ICD-10-CM | POA: Diagnosis not present

## 2021-04-06 DIAGNOSIS — Z79899 Other long term (current) drug therapy: Secondary | ICD-10-CM

## 2021-04-06 DIAGNOSIS — K5900 Constipation, unspecified: Secondary | ICD-10-CM | POA: Diagnosis present

## 2021-04-06 DIAGNOSIS — E876 Hypokalemia: Secondary | ICD-10-CM | POA: Diagnosis not present

## 2021-04-06 DIAGNOSIS — Z85820 Personal history of malignant melanoma of skin: Secondary | ICD-10-CM

## 2021-04-06 DIAGNOSIS — K5901 Slow transit constipation: Secondary | ICD-10-CM

## 2021-04-06 DIAGNOSIS — R5383 Other fatigue: Secondary | ICD-10-CM | POA: Diagnosis not present

## 2021-04-06 DIAGNOSIS — D72825 Bandemia: Secondary | ICD-10-CM | POA: Diagnosis not present

## 2021-04-06 DIAGNOSIS — Z7982 Long term (current) use of aspirin: Secondary | ICD-10-CM

## 2021-04-06 DIAGNOSIS — R0602 Shortness of breath: Secondary | ICD-10-CM

## 2021-04-06 DIAGNOSIS — Z808 Family history of malignant neoplasm of other organs or systems: Secondary | ICD-10-CM | POA: Diagnosis not present

## 2021-04-06 DIAGNOSIS — R059 Cough, unspecified: Secondary | ICD-10-CM | POA: Diagnosis not present

## 2021-04-06 DIAGNOSIS — Z833 Family history of diabetes mellitus: Secondary | ICD-10-CM

## 2021-04-06 DIAGNOSIS — F0631 Mood disorder due to known physiological condition with depressive features: Secondary | ICD-10-CM | POA: Diagnosis not present

## 2021-04-06 DIAGNOSIS — J45909 Unspecified asthma, uncomplicated: Secondary | ICD-10-CM | POA: Diagnosis present

## 2021-04-06 DIAGNOSIS — R400 Somnolence: Secondary | ICD-10-CM | POA: Diagnosis not present

## 2021-04-06 LAB — CBC
HCT: 46.5 % (ref 39.0–52.0)
Hemoglobin: 15.6 g/dL (ref 13.0–17.0)
MCH: 31 pg (ref 26.0–34.0)
MCHC: 33.5 g/dL (ref 30.0–36.0)
MCV: 92.4 fL (ref 80.0–100.0)
Platelets: 263 10*3/uL (ref 150–400)
RBC: 5.03 MIL/uL (ref 4.22–5.81)
RDW: 12.3 % (ref 11.5–15.5)
WBC: 7.6 10*3/uL (ref 4.0–10.5)
nRBC: 0 % (ref 0.0–0.2)

## 2021-04-06 LAB — CREATININE, SERUM
Creatinine, Ser: 0.72 mg/dL (ref 0.61–1.24)
GFR, Estimated: >60 mL/min (ref >60)

## 2021-04-06 MED ORDER — ACETAMINOPHEN 325 MG PO TABS
650.0000 mg | ORAL_TABLET | Freq: Four times a day (QID) | ORAL | Status: DC | PRN
  Administered 2021-04-08 – 2021-04-16 (×7): 650 mg via ORAL
  Filled 2021-04-06 (×9): qty 2

## 2021-04-06 MED ORDER — SENNOSIDES-DOCUSATE SODIUM 8.6-50 MG PO TABS
2.0000 | ORAL_TABLET | Freq: Every day | ORAL | Status: DC
  Administered 2021-04-06 – 2021-04-17 (×12): 2 via ORAL
  Filled 2021-04-06 (×12): qty 2

## 2021-04-06 MED ORDER — DEXAMETHASONE 2 MG PO TABS
1.0000 mg | ORAL_TABLET | Freq: Every day | ORAL | Status: AC
  Administered 2021-04-08 – 2021-04-10 (×3): 1 mg via ORAL
  Filled 2021-04-06 (×3): qty 1

## 2021-04-06 MED ORDER — DEXAMETHASONE 1 MG PO TABS: 1.0000 mg | ORAL_TABLET | Freq: Two times a day (BID) | ORAL | 0 refills | Status: DC

## 2021-04-06 MED ORDER — AMLODIPINE BESYLATE 5 MG PO TABS
5.0000 mg | ORAL_TABLET | Freq: Every day | ORAL | Status: DC
  Administered 2021-04-07 – 2021-04-10 (×4): 5 mg via ORAL
  Filled 2021-04-06 (×4): qty 1

## 2021-04-06 MED ORDER — PANTOPRAZOLE SODIUM 40 MG PO TBEC
40.0000 mg | DELAYED_RELEASE_TABLET | Freq: Every day | ORAL | Status: DC
  Administered 2021-04-06 – 2021-04-17 (×12): 40 mg via ORAL
  Filled 2021-04-06 (×12): qty 1

## 2021-04-06 MED ORDER — HEPARIN SODIUM (PORCINE) 5000 UNIT/ML IJ SOLN
5000.0000 [IU] | Freq: Two times a day (BID) | INTRAMUSCULAR | Status: DC
  Administered 2021-04-06 – 2021-04-18 (×24): 5000 [IU] via SUBCUTANEOUS
  Filled 2021-04-06 (×24): qty 1

## 2021-04-06 MED ORDER — ALBUTEROL SULFATE (2.5 MG/3ML) 0.083% IN NEBU: 3.0000 mL | INHALATION_SOLUTION | RESPIRATORY_TRACT | Status: DC | PRN

## 2021-04-06 MED ORDER — ALPRAZOLAM 0.25 MG PO TABS
0.2500 mg | ORAL_TABLET | Freq: Three times a day (TID) | ORAL | Status: DC | PRN
  Administered 2021-04-06 – 2021-04-15 (×5): 0.25 mg via ORAL
  Filled 2021-04-06 (×6): qty 1

## 2021-04-06 MED ORDER — HYDROCODONE-ACETAMINOPHEN 5-325 MG PO TABS
1.0000 | ORAL_TABLET | ORAL | Status: DC | PRN
  Administered 2021-04-08: 1 via ORAL
  Filled 2021-04-06 (×2): qty 1

## 2021-04-06 MED ORDER — DEXAMETHASONE 2 MG PO TABS
1.0000 mg | ORAL_TABLET | Freq: Two times a day (BID) | ORAL | Status: AC
  Administered 2021-04-06 – 2021-04-07 (×2): 1 mg via ORAL
  Filled 2021-04-06 (×2): qty 1

## 2021-04-06 MED ORDER — ONDANSETRON HCL 4 MG PO TABS
4.0000 mg | ORAL_TABLET | Freq: Four times a day (QID) | ORAL | Status: DC | PRN
  Administered 2021-04-08: 4 mg via ORAL
  Filled 2021-04-06: qty 1

## 2021-04-06 MED ORDER — LATANOPROST 0.005 % OP SOLN
1.0000 [drp] | Freq: Every day | OPHTHALMIC | Status: DC
  Administered 2021-04-06 – 2021-04-17 (×12): 1 [drp] via OPHTHALMIC
  Filled 2021-04-06: qty 2.5

## 2021-04-06 MED ORDER — POLYETHYLENE GLYCOL 3350 17 G PO PACK
17.0000 g | PACK | Freq: Two times a day (BID) | ORAL | Status: DC
  Administered 2021-04-06 – 2021-04-17 (×17): 17 g via ORAL
  Filled 2021-04-06 (×21): qty 1

## 2021-04-06 MED ORDER — MAGNESIUM HYDROXIDE 400 MG/5ML PO SUSP: 15.0000 mL | Freq: Every day | ORAL | Status: DC | PRN

## 2021-04-06 MED ORDER — BRINZOLAMIDE 1 % OP SUSP
1.0000 [drp] | Freq: Two times a day (BID) | OPHTHALMIC | Status: DC
  Administered 2021-04-06 – 2021-04-18 (×24): 1 [drp] via OPHTHALMIC
  Filled 2021-04-06: qty 10

## 2021-04-06 MED ORDER — IRBESARTAN 300 MG PO TABS
300.0000 mg | ORAL_TABLET | Freq: Every day | ORAL | Status: DC
  Administered 2021-04-07 – 2021-04-18 (×12): 300 mg via ORAL
  Filled 2021-04-06 (×12): qty 1

## 2021-04-06 MED ORDER — HYDROCHLOROTHIAZIDE 25 MG PO TABS
25.0000 mg | ORAL_TABLET | Freq: Every day | ORAL | Status: DC
  Administered 2021-04-07 – 2021-04-17 (×11): 25 mg via ORAL
  Filled 2021-04-06 (×11): qty 1

## 2021-04-06 MED ORDER — LEVETIRACETAM 500 MG PO TABS
500.0000 mg | ORAL_TABLET | Freq: Two times a day (BID) | ORAL | Status: DC
  Administered 2021-04-06 – 2021-04-18 (×24): 500 mg via ORAL
  Filled 2021-04-06 (×24): qty 1

## 2021-04-06 MED ORDER — PANTOPRAZOLE SODIUM 40 MG PO TBEC: 40.0000 mg | DELAYED_RELEASE_TABLET | Freq: Every day | ORAL | 0 refills | Status: AC

## 2021-04-06 MED ORDER — FAMOTIDINE 20 MG PO TABS
20.0000 mg | ORAL_TABLET | Freq: Every day | ORAL | Status: DC
  Administered 2021-04-07 – 2021-04-18 (×12): 20 mg via ORAL
  Filled 2021-04-06 (×12): qty 1

## 2021-04-06 MED ORDER — ONDANSETRON HCL 4 MG/2ML IJ SOLN: 4.0000 mg | Freq: Four times a day (QID) | INTRAMUSCULAR | Status: DC | PRN

## 2021-04-06 MED ORDER — ACETAMINOPHEN 650 MG RE SUPP
650.0000 mg | Freq: Four times a day (QID) | RECTAL | Status: DC | PRN
  Filled 2021-04-06: qty 1

## 2021-04-06 MED ORDER — HYDROCODONE-ACETAMINOPHEN 5-325 MG PO TABS: 1.0000 | ORAL_TABLET | ORAL | 0 refills | Status: DC | PRN

## 2021-04-06 MED ORDER — HEPARIN SODIUM (PORCINE) 5000 UNIT/ML IJ SOLN: 5000.0000 [IU] | Freq: Two times a day (BID) | INTRAMUSCULAR | Status: DC

## 2021-04-06 MED ORDER — LEVETIRACETAM 500 MG PO TABS: 500.0000 mg | ORAL_TABLET | Freq: Two times a day (BID) | ORAL | 1 refills | Status: DC

## 2021-04-06 NOTE — Discharge Summary (Signed)
Physician Discharge Summary  Patient ID: Shannon Horton. MRN: [id number] DOB/AGE: May 17, 1946 75 y.o.  Admit date: 03/25/2021 Discharge date: 04/06/2021  Admission Diagnoses:  1.  Right basal ganglia metastasis 2.  Lung and adrenal masses 3.  Left hemiparesis  Discharge Diagnoses:  Same Active Problems:   Malignant frontal lobe tumor (HCC)   S/P craniotomy Metastatic melanoma  Discharged Condition: Stable  Hospital Course:  Shannon Horton. is a 75 y.o. male that presented with approximately 6 weeks of difficulty walking and was ultimately complaining of headaches.  Imaging revealed a right basal ganglia tumor, further work-up revealed a right adrenal mass and multiple lung nodules.  Biopsy was performed of the adrenal mass via interventional radiology, preliminary read from pathology was melanoma.  He did have a history of melanoma in 2011 that was removed with negative nodal biopsy.  Consulted oncology who agreed with surgical intervention given the preliminary pathology.  Given his biopsy result, I discussed with he and his wife that the basal ganglia lesion was likely also metastasis.  Therefore I recommended resection given its size and preliminary pathology.  He underwent surgery, a right frontal craniotomy for resection of the tumor and tolerated it well.  He was observed postoperatively in the ICU and progressed to the stepdown floor.  PT/OT evaluated the patient recommended inpatient rehab.  He was excepted inpatient rehab.  His pain was controlled on oral medication, he was tolerating a normal diet, he was having normal bowel bladder function upon discharge.  His incision was healing well.  He was improved from his preoperative baseline.  Treatments: Surgery -right frontal craniotomy, resection of tumor  Discharge Exam: Blood pressure 123/73, pulse (!) 50, temperature 97.6 F (36.4 C), temperature source Oral, resp. rate 15, height 5\' 8"  (1.727 m), weight 70.5 kg, SpO2 99  %. Awake, alert, oriented x3 PERRLA EOMI Speech fluent, appropriate CN grossly intact 5/5 BUE/BLE Wound c/d/i, some dried blood along the middle of the incision  Disposition: Discharge disposition: 90-DC/txfr to inpt rehab facility with planned acute care hosp IP admission        Allergies as of 04/06/2021      Reactions   Banana Rash      Medication List    STOP taking these medications   acetaminophen 500 MG tablet Commonly known as: TYLENOL   aspirin 325 MG EC tablet   aspirin 81 MG EC tablet   Coenzyme Q10 200 MG capsule   EPINEPHrine 0.3 mg/0.3 mL Soaj injection Commonly known as: EPI-PEN   ibuprofen 200 MG tablet Commonly known as: ADVIL   multivitamin tablet   UNABLE TO FIND     TAKE these medications   albuterol 108 (90 Base) MCG/ACT inhaler Commonly known as: VENTOLIN HFA Inhale 2 puffs into the lungs every 4 (four) hours as needed for wheezing or shortness of breath.   amLODipine 5 MG tablet Commonly known as: NORVASC Take 1 tablet (5 mg total) by mouth daily.   Arnuity Ellipta 200 MCG/ACT Aepb Generic drug: Fluticasone Furoate Inhale 1 puff into the lungs daily.   bimatoprost 0.03 % ophthalmic solution Commonly known as: LUMIGAN Place 1 drop into both eyes at bedtime.   brinzolamide 1 % ophthalmic suspension Commonly known as: AZOPT Place 1 drop into both eyes 2 (two) times daily.   dexamethasone 1 MG tablet Commonly known as: DECADRON Take 1 tablet (1 mg total) by mouth every 12 (twelve) hours.   HYDROcodone-acetaminophen 5-325 MG tablet Commonly known as: NORCO/VICODIN  Take 1 tablet by mouth every 4 (four) hours as needed for moderate pain.   levETIRAcetam 500 MG tablet Commonly known as: KEPPRA Take 1 tablet (500 mg total) by mouth 2 (two) times daily.   pantoprazole 40 MG tablet Commonly known as: PROTONIX Take 1 tablet (40 mg total) by mouth at bedtime.   telmisartan-hydrochlorothiazide 80-25 MG tablet Commonly known as:  MICARDIS HCT TAKE 1 TABLET BY MOUTH EVERY DAY       Follow-up Information    Mandeep Ferch C, DO Follow up in 2 week(s).   Why: call for appointment Contact information: [address] [place] [address] [phone]        Ventura Sellers, MD Follow up in 2 week(s).   Specialties: Psychiatry, Neurology, Oncology Contact information: [place] [address] [phone]        Nicholas Lose, MD Follow up in 3 week(s).   Specialty: Hematology and Oncology Contact information: [place] [address] [phone]               Signed: Theodoro Doing Sherma Vanmetre 04/06/2021, 12:26 PM

## 2021-04-06 NOTE — Progress Notes (Signed)
Inpatient Rehabilitation Medication Review by a Pharmacist  A complete drug regimen review was completed for this patient to identify any potential clinically significant medication issues.  Clinically significant medication issues were identified:  yes   Type of Medication Issue Identified Description of Issue Urgent (address now) Non-Urgent (address on AM team rounds) Plan Plan Accepted by Provider? (Yes / No / Pending AM Rounds)  Drug Interaction(s) (clinically significant)       Duplicate Therapy       Allergy       No Medication Administration End Date       Incorrect Dose       Additional Drug Therapy Needed  Per neurosurgery discharge summary, pt to resume Arnuity Ellipta inhaler (220mcg daily), not yet ordered (will be subbed with budesonide nebs while admitted). Non-urgent    Other         Name of provider notified for urgent issues identified: Await am   Time spent performing this drug regimen review (minutes):  46min   Wynona Neat, PharmD, BCPS  04/06/2021 6:39 PM

## 2021-04-06 NOTE — Progress Notes (Signed)
Physical Therapy Treatment Patient Details Name: Shannon Horton. MRN: [id number] DOB: [birth year] Today's Date: 04/06/2021    History of Present Illness 75 yo male presents to [facility] on 5/26 with headaches, balance issues with falls x4-6 weeks. MRI brain shows Large, 3.8 cm enhancing and hemorrhagic mass within the right basal ganglia/frontal lobe with 85mm L midline shift, concern for metastasis. CT chest/abdomen/pelvis shows L upper pulmonary nodul, L hilar adenopathy, RUL pulmonary nodule, upper quadrant mass arising from R adrenal gland, hepatic lesions. s/p CT guided biopsy R adrenal mass on 5/31 with pathology + for malignant neoplasm, oncology following. s/p R frontal craniotomy 6/1. PMH includes glaucoma, HTN, malignant melanoma 2011.    PT Comments    Pt progressing well towards his physical therapy goals. Session focused on postural re-education, gait training, and sit to stands for functional strengthening. Pt requiring up to min assist for functional mobility. Ambulating x 200 feet with a walker. Requires mod-max cues for walker proximity and upper trunk control. Continue to recommend comprehensive inpatient rehab (CIR) for post-acute therapy needs.    Follow Up Recommendations  CIR     Equipment Recommendations  None recommended by PT    Recommendations for Other Services       Precautions / Restrictions Precautions Precautions: Fall Precaution Comments: R crani Restrictions Weight Bearing Restrictions: No    Mobility  Bed Mobility Overal bed mobility: Modified Independent                  Transfers Overall transfer level: Needs assistance Equipment used: Rolling walker (2 wheeled) Transfers: Sit to/from Stand Sit to Stand: Min guard;Min assist         General transfer comment: Light minA to rise and steady from edge of bed, initial retropulsion and bracing BLE's against back of bed. Progressing to min guard assist from recliner chair with cues for  eccentric control  Ambulation/Gait Ambulation/Gait assistance: Min assist Gait Distance (Feet): 200 Feet Assistive device: Rolling walker (2 wheeled) Gait Pattern/deviations: Step-through pattern;Decreased stride length;Trunk flexed;Shuffle Gait velocity: decreased   General Gait Details: Mod-max cues for posture and RW proximity, increased foot clearance, particularly with obstacle negotiation and turns. Worked on increasing gait speed   Marine scientist Rankin (Stroke Patients Only)       Balance Overall balance assessment: Needs assistance;History of Falls Sitting-balance support: Feet supported Sitting balance-Leahy Scale: Good     Standing balance support: Bilateral upper extremity supported;During functional activity Standing balance-Leahy Scale: Poor Standing balance comment: reliant on BUE support                            Cognition Arousal/Alertness: Awake/alert Behavior During Therapy: WFL for tasks assessed/performed Overall Cognitive Status: Impaired/Different from baseline Area of Impairment: Attention;Safety/judgement;Problem solving                   Current Attention Level: Selective Memory: Decreased recall of precautions;Decreased short-term memory   Safety/Judgement: Decreased awareness of safety;Decreased awareness of deficits Awareness: Emergent Problem Solving: Slow processing;Decreased initiation;Difficulty sequencing;Requires verbal cues;Requires tactile cues General Comments: Emerging awareness of deficits i.e. that balance is off, but asking nurse why he can't get out of bed by himself      Exercises Other Exercises Other Exercises: Seated: scapular retractions x 10, cervical retractions x 5 Other Exercises: Sit to stands x 5  General Comments        Pertinent Vitals/Pain Pain Assessment: No/denies pain    Home Living                      Prior Function             PT Goals (current goals can now be found in the care plan section) Acute Rehab PT Goals Patient Stated Goal: to get better and return home with family; return to cycling Potential to Achieve Goals: Good Progress towards PT goals: Progressing toward goals    Frequency    Min 4X/week      PT Plan Current plan remains appropriate    Co-evaluation              AM-PAC PT "6 Clicks" Mobility   Outcome Measure  Help needed turning from your back to your side while in a flat bed without using bedrails?: None Help needed moving from lying on your back to sitting on the side of a flat bed without using bedrails?: A Little Help needed moving to and from a bed to a chair (including a wheelchair)?: A Little Help needed standing up from a chair using your arms (e.g., wheelchair or bedside chair)?: A Little Help needed to walk in hospital room?: A Little Help needed climbing 3-5 steps with a railing? : A Lot 6 Click Score: 18    End of Session Equipment Utilized During Treatment: Gait belt Activity Tolerance: Patient tolerated treatment well Patient left: in chair;with call bell/phone within reach;with chair alarm set Nurse Communication: Mobility status PT Visit Diagnosis: Other abnormalities of gait and mobility (R26.89);Difficulty in walking, not elsewhere classified (R26.2)     Time: 4034-7425 PT Time Calculation (min) (ACUTE ONLY): 19 min  Charges:  $Gait Training: 8-22 mins                     Wyona Almas, PT, DPT Acute Rehabilitation Services Pager [phone] Office [phone]    Deno Etienne 04/06/2021, 10:58 AM

## 2021-04-06 NOTE — Progress Notes (Signed)
Inpatient Rehabilitation Admissions Coordinator  We have insurance approval to admit to CIR and bed is available today. I met with patient and wife at bedside and they are in agreement. I have alerted acute team, Dr. Reatha Armour and TOC. I will make the arrangements to admit today.  Danne Baxter, RN, MSN Rehab Admissions Coordinator [phone] 04/06/2021 10:14 AM

## 2021-04-06 NOTE — Progress Notes (Signed)
INPATIENT REHABILITATION ADMISSION NOTE   Arrival Method: bed     Mental Orientation: alert and oriented x 4   Assessment: completed   Skin: incision on scalp, abrasion right arm   IV'S: right hand, left hand   Pain: no pain   Tubes and Drains: none   Safety Measures: safety   Vital Signs: completed   Height and Weight:   Rehab Orientation: completed   Family: wife at bedside    Notes:

## 2021-04-06 NOTE — H&P (Signed)
Physical Medicine and Rehabilitation Admission H&P        Chief Complaint  Patient presents with  . Headache  : HPI: Shannon Horton is a 75 year old right-handed male history of glaucoma followed by Dr. Satira Sark, hypertension, tobacco use, remote  melanoma x2 removed from his right calf and left flank 2011 with negative nodal biopsy.  Per chart review patient lives with spouse.  Two-level home bed and bath main level with one-step to entry.  Independent with assistive device and reported multiple falls over the past 6 weeks.  Prior to 6 weeks ago patient independent and active.  Presented 03/25/2021 with persistent headaches balance deficits x6 weeks, left hemiparesis as well as shuffling gait.  MRI of the brain showed a 3.8 cm enhancing and hemorrhagic mass in the right basal ganglia/frontal lobe region with a 5 mm left midline shift.  CT of the chest and abdomen was notable for left upper pulmonary nodule, left hilar adenopathy right upper lobe pulmonary nodule left lower quadrant mass arising from the right adrenal gland, hepatic lesions.  CT-guided biopsy of the adrenal mass 03/30/2021 positive for malignancy.  Patient underwent right craniotomy resection of tumor 03/31/2021 per Dr. Reatha Armour.  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  Brain surgical tissue pathology remains pending with follow-up per oncology services Dr. Lindi Adie as well as Dr. Alen Blew..  Decadron protocol as indicated.  Maintained on Keppra for seizure prophylaxis.  Patient was cleared to begin subcutaneous heparin for DVT prophylaxis 04/02/2021.  Tolerating a regular consistency diet.  Therapy evaluations completed due to patient decreased functional mobility/left hemiparesis was admitted for a comprehensive rehab program.   Review of Systems  Constitutional: Positive for malaise/fatigue. Negative for chills and fever.  Eyes: Positive for blurred vision. Negative for double vision.  Respiratory:  Negative for cough and shortness of breath.   Cardiovascular: Negative for chest pain, palpitations and leg swelling.  Gastrointestinal: Positive for constipation. Negative for heartburn, nausea and vomiting.  Genitourinary: Negative for dysuria, flank pain and hematuria.  Musculoskeletal: Positive for falls, joint pain and myalgias.  Skin: Negative for rash.  Neurological: Positive for dizziness, weakness and headaches.  All other systems reviewed and are negative.   Past Medical History:  Diagnosis Date  . Allergic rhinitis      gets shots per Dr. Velora Heckler  . Allergy      sees Dr. Harold Hedge   . Asthma    . Glaucoma      sees Dr. Satira Sark   . Hypertension    . Malignant melanoma ([place])      right lower leg, diagnosed on1/21/11         Past Surgical History:  Procedure Laterality Date  . APPLICATION OF CRANIAL NAVIGATION N/A 03/31/2021    Procedure: APPLICATION OF CRANIAL NAVIGATION;  Surgeon: Dawley, Theodoro Doing, DO;  Location: [place];  Service: Neurosurgery;  Laterality: N/A;  . CATARACT EXTRACTION        right ey, Dr. Satira Sark   . COLONOSCOPY   12/15/2015    per Dr.Kaplan, clear, no repeats   . CRANIOTOMY N/A 03/31/2021    Procedure: RIGHT FRONTAL CRANIOTOMY FOR TUMOR;  Surgeon: Dawley, Theodoro Doing, DO;  Location: [place];  Service: Neurosurgery;  Laterality: N/A;  . ESOPHAGOGASTRODUODENOSCOPY        WITH ESOPHAGEAL DILATION 2006 PER dR. kAPLAN  . REMOVAL  of melanoma   12/08/09    from right calf; per Dr. Monica Becton  .  removal of melanoma from left abdomen   01/21/10     per Dr. Meredith Leeds  . SQUAMOUS CELL CARCINOMA EXCISION   2013    per Dr. Terrence Dupont, from top of scalp          Family History  Problem Relation Age of Onset  . Hypertension Other    . Colon polyps Mother    . Hypertension Mother    . Diabetes Father    . Liver disease Maternal Uncle    . Cancer Brother          throat and lung (smoker)  . Colon cancer Neg Hx    . Stomach cancer Neg Hx    . Esophageal cancer Neg Hx     . Pancreatic cancer Neg Hx      Social History:  reports that he has quit smoking. He has never used smokeless tobacco. He reports current alcohol use of about 5.0 - 10.0 standard drinks of alcohol per week. He reports that he does not use drugs. Allergies:      Allergies  Allergen Reactions  . Banana Rash    Medications Prior to Admission  Medication Sig Dispense Refill  . acetaminophen (TYLENOL) 500 MG tablet Take 1,000 mg by mouth every 6 (six) hours as needed for mild pain.      Marland Kitchen albuterol (PROVENTIL HFA;VENTOLIN HFA) 108 (90 Base) MCG/ACT inhaler Inhale 2 puffs into the lungs every 4 (four) hours as needed for wheezing or shortness of breath. 8.5 Inhaler 0  . amLODipine (NORVASC) 5 MG tablet Take 1 tablet (5 mg total) by mouth daily. 90 tablet 3  . aspirin 325 MG EC tablet Take 325 mg by mouth every 6 (six) hours as needed for pain (alt with ibuprofen).      Marland Kitchen aspirin 81 MG EC tablet Take 81 mg by mouth daily.      . bimatoprost (LUMIGAN) 0.03 % ophthalmic solution Place 1 drop into both eyes at bedtime.      . brinzolamide (AZOPT) 1 % ophthalmic suspension Place 1 drop into both eyes 2 (two) times daily.      . Coenzyme Q10 200 MG capsule Take 200 mg by mouth daily.      Marland Kitchen EPINEPHrine 0.3 mg/0.3 mL IJ SOAJ injection Inject 0.3 mg into the muscle See admin instructions.      . Fluticasone Furoate (ARNUITY ELLIPTA) 200 MCG/ACT AEPB Inhale 1 puff into the lungs daily.      Marland Kitchen ibuprofen (ADVIL) 200 MG tablet Take 200-400 mg by mouth every 6 (six) hours as needed for headache.      . Multiple Vitamin (MULTIVITAMIN) tablet Take 1 tablet by mouth daily. One A Day      . telmisartan-hydrochlorothiazide (MICARDIS HCT) 80-25 MG tablet TAKE 1 TABLET BY MOUTH EVERY DAY 90 tablet 3  . UNABLE TO FIND Allergy shots at [facility] every month          Drug Regimen Review Drug regimen was reviewed and remains appropriate with no significant issues identified   Home: Home  Living Family/patient expects to be discharged to:: Private residence Living Arrangements: Spouse/significant other Available Help at Discharge: Family,Available 24 hours/day Type of Home: House Home Access: Stairs to enter [employer] of Steps: 1 Entrance Stairs-Rails: None Home Layout: One level,Other (Comment) (bonus room above garage) Bathroom Shower/Tub: Walk-in shower,Tub/shower unit [employer]: Standard Bathroom Accessibility: Yes Home Equipment: Environmental consultant - 2 wheels,Cane - single point,Hand held shower head  Lives  With: Spouse   Functional History: Prior Function Level of Independence: Independent with assistive device(s) Comments: pt reports using cane progressing to RW for ambulation intermittently PTA, recent history of multiple falls over the past 6 weeks. Prior to 6 weeks ago, pt biking 20+ miles on bike.   Functional Status:  Mobility: Bed Mobility Overal bed mobility: Modified Independent Bed Mobility: Supine to Sit Supine to sit: Modified independent (Device/Increase time) General bed mobility comments: no physcial assist needed, use of bed features Transfers Overall transfer level: Needs assistance Equipment used: Rolling walker (2 wheeled) Transfers: Sit to/from Stand Sit to Stand: Min assist Stand pivot transfers: Mod assist,+2 physical assistance General transfer comment: posterior lean noted upon initial stand but improved as session progressed, pt impulsively stood from toilet with no physcial assist Ambulation/Gait Ambulation/Gait assistance: Min assist Gait Distance (Feet): 120 Feet Assistive device: Rolling walker (2 wheeled) Gait Pattern/deviations: Step-through pattern,Decreased stride length,Trunk flexed,Shuffle General Gait Details: Mod-max cues for posture and RW proximity and increased foot clearance. Pt unable to monitor for self fatigue, with decreasing foot clearance with distance. MinA for balance and cues for  sequencing/direction Gait velocity: decreased Gait velocity interpretation: <1.8 ft/sec, indicate of risk for recurrent falls   ADL: ADL Overall ADL's : Needs assistance/impaired Eating/Feeding: Set up,Supervision/ safety,Sitting Grooming: Wash/dry hands,Standing,Min guard,Supervision/safety Upper Body Bathing: Set up,Sitting Lower Body Bathing: Moderate assistance,Sit to/from stand Upper Body Dressing : Moderate assistance,Sitting Upper Body Dressing Details (indicate cue type and reason): to don new gown Lower Body Dressing: Moderate assistance,Sit to/from stand Toilet Transfer: Minimal assistance,RW,Ambulation,Regular Toilet,Grab bars,Cueing for safety Toilet Transfer Details (indicate cue type and reason): MIN a for balance when ambulating to BR, LOB in BR needing MIN A to correct, cues for safety awareness Toileting- Clothing Manipulation and Hygiene: Moderate assistance Functional mobility during ADLs: Minimal assistance,Min guard,Rolling walker,Cueing for safety General ADL Comments: pt continues to present with impaired balance and impaired safety awareness   Cognition: Cognition Overall Cognitive Status: Impaired/Different from baseline Orientation Level: Oriented X4 Cognition Arousal/Alertness: Awake/alert Behavior During Therapy: WFL for tasks assessed/performed,Impulsive Overall Cognitive Status: Impaired/Different from baseline Area of Impairment: Attention,Safety/judgement,Problem solving Current Attention Level: Selective Memory: Decreased recall of precautions,Decreased short-term memory Following Commands: Follows one step commands consistently Safety/Judgement: Decreased awareness of safety,Decreased awareness of deficits Awareness: Emergent Problem Solving: Slow processing,Decreased initiation,Difficulty sequencing,Requires verbal cues,Requires tactile cues General Comments: minimal awareness noted to LOB, cues for safety awareness as pt impulsively sit<>stand  from toilet despite education on waiting for OTA to assist pt   Physical Exam: Blood pressure 115/60, pulse (!) 50, temperature (!) 96.9 F (36.1 C), temperature source Axillary, resp. rate 14, height 5\' 8"  (1.727 m), weight 70.5 kg, SpO2 91 %. Physical Exam HENT:     Head:     Comments: Craniotomy site clean and dry.  There was some clotted blood on mid incision.    Mouth/Throat:     Mouth: Mucous membranes are moist.  Eyes:     Extraocular Movements: Extraocular movements intact.     Pupils: Pupils are equal, round, and reactive to light.     Comments: Decreased visual acuity OD. No obvious opacification, discoloration, trauma noted OD  Cardiovascular:     Rate and Rhythm: Normal rate and regular rhythm.     Heart sounds: No murmur heard. No gallop.   Pulmonary:     Effort: Pulmonary effort is normal. No respiratory distress.     Breath sounds: No wheezing.  Abdominal:     General: There is  no distension.     Palpations: Abdomen is soft.     Tenderness: There is no guarding.  Musculoskeletal:        General: No swelling.     Cervical back: Normal range of motion and neck supple.  Skin:    General: Skin is warm.     Comments: Hands are ice cold  Neurological:     Mental Status: He is alert.     Comments: Patient is alert.  No acute distress.  Makes eye contact with examiner.  Provides name and age.  Follows simple commands. RUE 4+/5. LUE 4/5. RLE and RLE 4/5 prox to distal. Decreased fmc on left. Mild left inattention. Speech clear. Normal language. Fair insight and awareness.        Lab Results Last 48 Hours  No results found for this or any previous visit (from the past 48 hour(s)).   Imaging Results (Last 48 hours)  No results found.           Medical Problem List and Plan: 1.  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  Follow-up oncology  services. Pathology positive for malignant melanoma             -patient may shower             -ELOS/Goals: 13-20 days, supervision for PT, OT, SLP 2.  Antithrombotics: -DVT/anticoagulation: Subcutaneous heparin.             -antiplatelet therapy: N/A 3. Pain Management: Hydrocodone as needed 4. Mood: Xanax 0.25 mg 3 times daily as needed             -antipsychotic agents: N/A 5. Neuropsych: This patient is capable of making decisions on his own behalf. 6. Skin/Wound Care: Routine skin checks             -keep scalp clean and dry 7. Fluids/Electrolytes/Nutrition: Routine in and outs with follow-up chemistries 8.  Hypertension.  Norvasc 5 mg daily, Avapro 300 mg daily, HCTZ 25 mg daily.  Monitor with increased mobility             -adjust regimen as needed 9.  Seizure prophylaxis.  Keppra 500 mg twice daily 10.  Glaucoma.  Followed by Dr. Satira Sark.  Continue eyedrops. 11.  History of asthma.  Continue inhalers as directed.  Check oxygen saturations every shift 12.  Constipation.               -no bm since admit             -miralax bid             -senna-s at bedtime     Cathlyn Parsons, PA-C 04/06/2021  I have personally performed a face to face diagnostic evaluation of this patient and formulated the key components of the plan.  Additionally, I have personally reviewed laboratory data, imaging studies, as well as relevant notes and concur with the physician assistant's documentation above.  The patient's status has not changed from the original H&P.  Any changes in documentation from the acute care chart have been noted above.  [name], MD, Mellody Drown

## 2021-04-06 NOTE — Progress Notes (Signed)
CARE       Signed          Show:Clear all '[x]' Manual'[x]' Template'[x]' Copied  Added by: '[x]' My [place], Vertis Kelch, RN'[x]' Bethel Born, CCC-SLP   '[]' Hover for details  PMR Admission Coordinator Pre-Admission Assessment   Patient: Shannon Horton. is an 75 y.o., male MRN: [id number] DOB: 1946-06-25 Height: '5\' 8"'  (172.7 cm) Weight: 70.5 kg                                                                                                                                                  Insurance Information HMO:     PPO: yes     PCP:      IPA:      80/20:      OTHER:  PRIMARY: [employer] CHO      Policy#: [id number]      Subscriber: patient CM Name: Luster Landsberg      Phone#: [phone] ext [phone]     Fax#: [phone] Pre-Cert#: [id number] Received approval from Dawson Bills. Pt approved for 5 days beginning 04/06/21     Employer:  Benefits:  Phone #: n/a-online at [email or web address]     Name:  Eff. Date: 11/01/19-still active     Deduct: does not have Out of Pocket Max: $3,300 ($508.59 met)      Life Max: NA  CIR: $125/day co-pay with max co-pay of $1,250/admission (10 days)      SNF: 100% Outpatient: $20/visit co-pay     Co-Pay:  Home Health: 100%      Co-Pay:  DME: 80%     Co-Pay: 20% Providers: in-network SECONDARY:       Policy#:       Phone#:    Development worker, community:       Phone#:    The Engineer, petroleum" for patients in Inpatient Rehabilitation Facilities with attached "Privacy Act Herrings Records" was provided and verbally reviewed with: Patient and Family   Emergency Contact Information         Contact Information     Name Relation Home Work Mobile    Mayhall,Alice Spouse [phone]           Current Medical History  Patient Admitting Diagnosis: s/p resection of R basal ganglia metastasis   History of Present Illness:   Shannon Horton is a 75 year old right-handed male history of glaucoma followed by Dr. Satira Sark, hypertension,  tobacco use, remote  melanoma x2 removed from his right calf and left flank 2011 with negative nodal biopsy.   Independent with assistive device and reported multiple falls over the past 6 weeks.  Prior to 6 weeks ago patient independent and active.  Presented 03/25/2021 with persistent headaches balance deficits x6 weeks, left hemiparesis as well as shuffling gait.  MRI of the brain showed a 3.8 cm enhancing and hemorrhagic mass in the right  basal ganglia/frontal lobe region with a 5 mm left midline shift.  CT of the chest and abdomen was notable for left upper pulmonary nodule, left hilar adenopathy right upper lobe pulmonary nodule left lower quadrant mass arising from the right adrenal gland, hepatic lesions.  CT-guided biopsy of the adrenal mass 03/30/2021 positive for malignancy.  Patient underwent right craniotomy resection of tumor 03/31/2021 per Dr. Reatha Armour.  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  Brain surgical tissue pathology remains pending with follow-up per oncology services Dr. Lindi Adie as well as Dr. Alen Blew..  Decadron protocol as indicated.  Maintained on Keppra for seizure prophylaxis.  Patient was cleared to begin subcutaneous heparin for DVT prophylaxis 04/02/2021.  Tolerating a regular consistency diet.  Therapy    Past Medical History      Past Medical History:  Diagnosis Date  . Allergic rhinitis      gets shots per Dr. Velora Heckler  . Allergy      sees Dr. Harold Hedge   . Asthma    . Glaucoma      sees Dr. Satira Sark   . Hypertension    . Malignant melanoma ([place])      right lower leg, diagnosed on1/21/11      Family History  family history includes Cancer in his brother; Colon polyps in his mother; Diabetes in his father; Hypertension in his mother and another family member; Liver disease in his maternal uncle.   Prior Rehab/Hospitalizations:  Has the patient had prior rehab or hospitalizations prior to admission? No   Has the patient had major surgery during  100 days prior to admission? Yes   Current Medications    Current Facility-Administered Medications:  .  0.9 %  sodium chloride infusion, , Intravenous, Continuous, Dawley, Troy C, DO, Last Rate: 50 mL/hr at 04/02/21 1939, New Bag at 04/02/21 1939 .  acetaminophen (TYLENOL) tablet 650 mg, 650 mg, Oral, Q6H PRN, 650 mg at 04/01/21 0942 **OR** acetaminophen (TYLENOL) suppository 650 mg, 650 mg, Rectal, Q6H PRN, Dawley, Troy C, DO .  albuterol (PROVENTIL) (2.5 MG/3ML) 0.083% nebulizer solution 3 mL, 3 mL, Inhalation, Q4H PRN, Dawley, Troy C, DO, 3 mL at 03/30/21 2348 .  ALPRAZolam Duanne Moron) tablet 0.25 mg, 0.25 mg, Oral, TID PRN, Dawley, Troy C, DO, 0.25 mg at 04/04/21 2138 .  amLODipine (NORVASC) tablet 5 mg, 5 mg, Oral, Daily, Dawley, Troy C, DO, 5 mg at 04/04/21 1051 .  brinzolamide (AZOPT) 1 % ophthalmic suspension 1 drop, 1 drop, Both Eyes, BID, Dawley, Troy C, DO, 1 drop at 04/04/21 2136 .  Chlorhexidine Gluconate Cloth 2 % PADS 6 each, 6 each, Topical, Daily, Dawley, Troy C, DO, 6 each at 04/03/21 1005 .  [COMPLETED] dexamethasone (DECADRON) tablet 4 mg, 4 mg, Oral, Q8H, 4 mg at 04/03/21 1539 **FOLLOWED BY** [COMPLETED] dexamethasone (DECADRON) tablet 2 mg, 2 mg, Oral, Q8H, 2 mg at 04/04/21 1402 **FOLLOWED BY** dexamethasone (DECADRON) tablet 2 mg, 2 mg, Oral, Q12H, 2 mg at 04/05/21 1004 **FOLLOWED BY** [START ON 04/06/2021] dexamethasone (DECADRON) tablet 1 mg, 1 mg, Oral, Q12H **FOLLOWED BY** [START ON 04/08/2021] dexamethasone (DECADRON) tablet 1 mg, 1 mg, Oral, Daily, [name], MD .  docusate sodium (COLACE) capsule 100 mg, 100 mg, Oral, BID, Dawley, Troy C, DO, 100 mg at 04/05/21 1005 .  enalaprilat (VASOTEC) injection 1.25 mg, 1.25 mg, Intravenous, Q6H PRN, [name], MD, 1.25 mg at 04/04/21 1050 .  famotidine (PEPCID) tablet 20 mg, 20 mg, Oral, Daily, Dawley, Pieter Partridge  C, DO, 20 mg at 04/05/21 1004 .  heparin injection 5,000 Units, 5,000 Units, Subcutaneous, Q12H, Dawley, Troy C,  DO, 5,000 Units at 04/05/21 1005 .  irbesartan (AVAPRO) tablet 300 mg, 300 mg, Oral, Daily, 300 mg at 04/04/21 1050 **AND** hydrochlorothiazide (HYDRODIURIL) tablet 25 mg, 25 mg, Oral, Daily, Dawley, Troy C, DO, 25 mg at 04/04/21 1050 .  HYDROcodone-acetaminophen (NORCO/VICODIN) 5-325 MG per tablet 1 tablet, 1 tablet, Oral, Q4H PRN, Dawley, Troy C, DO, 1 tablet at 04/04/21 2139 .  labetalol (NORMODYNE) injection 10-40 mg, 10-40 mg, Intravenous, Q10 min PRN, Dawley, Troy C, DO .  latanoprost (XALATAN) 0.005 % ophthalmic solution 1 drop, 1 drop, Both Eyes, QHS, Dawley, Troy C, DO, 1 drop at 04/04/21 2135 .  levETIRAcetam (KEPPRA) tablet 500 mg, 500 mg, Oral, BID, Dawley, Troy C, DO, 500 mg at 04/05/21 1005 .  morphine 2 MG/ML injection 1-2 mg, 1-2 mg, Intravenous, Q2H PRN, Dawley, Troy C, DO, 2 mg at 04/01/21 0058 .  ondansetron (ZOFRAN) tablet 4 mg, 4 mg, Oral, Q6H PRN **OR** ondansetron (ZOFRAN) injection 4 mg, 4 mg, Intravenous, Q6H PRN, Dawley, Troy C, DO, 4 mg at 03/31/21 1848 .  pantoprazole (PROTONIX) EC tablet 40 mg, 40 mg, Oral, QHS, Dawley, Troy C, DO, 40 mg at 04/04/21 2138 .  phentolamine (REGITINE) injection 10 mg, 10 mg, Intravenous, Once PRN, Dawley, Troy C, DO .  polyethylene glycol (MIRALAX / GLYCOLAX) packet 17 g, 17 g, Oral, Daily, Ashok Pall, MD, 17 g at 04/05/21 1004 .  promethazine (PHENERGAN) tablet 12.5-25 mg, 12.5-25 mg, Oral, Q4H PRN, Dawley, Troy C, DO   Patients Current Diet:     Diet Order                      Diet regular Room service appropriate? Yes with Assist; Fluid consistency: Thin  Diet effective now                      Precautions / Restrictions Precautions Precautions: Fall Precaution Comments: R crani; L inattention Restrictions Weight Bearing Restrictions: No    Has the patient had 2 or more falls or a fall with injury in the past year?Yes   Prior Activity Level Community (5-7x/wk): wife drives recently but takes pt with her   Prior  Functional Level Prior Function Level of Independence: Independent with assistive device(s) Comments: pt reports using cane progressing to RW for ambulation intermittently PTA, recent history of multiple falls over the past 6 weeks. Prior to 6 weeks ago, pt biking 20+ miles on bike.   Self Care: Did the patient need help bathing, dressing, using the toilet or eating?  Independent   Indoor Mobility: Did the patient need assistance with walking from room to room (with or without device)? Independent   Stairs: Did the patient need assistance with internal or external stairs (with or without device)? Independent   Functional Cognition: Did the patient need help planning regular tasks such as shopping or remembering to take medications? Independent   Home Assistive Devices / Equipment Home Equipment: Walker - 2 wheels,Cane - single point,Hand held shower head   Prior Device Use: Indicate devices/aids used by the patient prior to current illness, exacerbation or injury? cane   Current Functional Level Cognition   Overall Cognitive Status: Impaired/Different from baseline Current Attention Level: Selective Orientation Level: Oriented X4 Following Commands: Follows one step commands consistently Safety/Judgement: Decreased awareness of safety,Decreased awareness of deficits General Comments: Decreased ability  to monitor for self fatigue    Extremity Assessment (includes Sensation/Coordination)   Upper Extremity Assessment: Defer to OT evaluation  Lower Extremity Assessment: Generalized weakness (at least 3+/5 throughout bilat. + incoordination as assessed via heel-to-shin test)     ADLs   Overall ADL's : Needs assistance/impaired Eating/Feeding: Set up,Supervision/ safety,Sitting Grooming: Set up,Supervision/safety,Sitting Upper Body Bathing: Set up,Sitting Lower Body Bathing: Moderate assistance,Sit to/from stand Upper Body Dressing : Minimal assistance,Sitting Lower Body Dressing:  Moderate assistance,Sit to/from stand Toilet Transfer: Moderate assistance,+2 for physical assistance,Stand-pivot (took several steps) Toileting- Clothing Manipulation and Hygiene: Moderate assistance Functional mobility during ADLs: Moderate assistance,+2 for safety/equipment     Mobility   Overal bed mobility: Needs Assistance Bed Mobility: Supine to Sit Supine to sit: Supervision,HOB elevated General bed mobility comments: OOB in chair     Transfers   Overall transfer level: Needs assistance Equipment used: Rolling walker (2 wheeled) Transfers: Sit to/from Stand Sit to Stand: Min guard Stand pivot transfers: Mod assist,+2 physical assistance General transfer comment: Cues for scooting forward to edge of seat, hand placement to stand from recliner surface     Ambulation / Gait / Stairs / Wheelchair Mobility   Ambulation/Gait Ambulation/Gait assistance: Min Web designer (Feet): 120 Feet Assistive device: Rolling walker (2 wheeled) Gait Pattern/deviations: Step-through pattern,Decreased stride length,Trunk flexed,Shuffle General Gait Details: Mod-max cues for posture and RW proximity and increased foot clearance. Pt unable to monitor for self fatigue, with decreasing foot clearance with distance. MinA for balance and cues for sequencing/direction Gait velocity: decreased Gait velocity interpretation: <1.8 ft/sec, indicate of risk for recurrent falls     Posture / Balance Dynamic Sitting Balance Sitting balance - Comments: heavy L lateral leaning, verbal and tactile cuing to correct repeatedly Balance Overall balance assessment: Needs assistance,History of Falls Sitting-balance support: Feet supported Sitting balance-Leahy Scale: Good Sitting balance - Comments: heavy L lateral leaning, verbal and tactile cuing to correct repeatedly Postural control: Left lateral lean,Posterior lean Standing balance support: Bilateral upper extremity supported,During functional  activity Standing balance-Leahy Scale: Poor Standing balance comment: reliant on BUE support     Special needs/care consideration Continuous Drip IV  0.9% sodium chloride infusion: 54m/hr, Skin Surgical incision: head/right and Designated visitor ATaeshawn Helfman wife        Previous Home Environment  Living Arrangements: Spouse/significant other  Lives With: Spouse Available Help at Discharge: Family,Available 24 hours/day Type of Home: House Home Layout: One level,Other (Comment) (bonus room above garage) Home Access: Stairs to enter Entrance Stairs-Rails: None ETechnical brewerof Steps: 1 Bathroom Shower/Tub: [place]ulance [place] Standard Bathroom Accessibility: Yes How Accessible: Accessible via walker Home Care Services: No   Discharge Living Setting Plans for Discharge Living Setting: Patient's home Type of Home at Discharge: House Discharge Home Layout: One level,Other (Comment) (bonus room above garage) Discharge Home Access: Stairs to enter Entrance Stairs-Rails: None Entrance Stairs-Number of Steps: 1 Discharge Bathroom Shower/Tub: Walk-in shower,Tub/shower unit Discharge Bathroom Toilet: Standard Discharge Bathroom Accessibility: Yes How Accessible: Accessible via walker Does the patient have any problems obtaining your medications?: No   Social/Family/Support Systems Anticipated Caregiver: ADeangelo Berns wife Anticipated Caregiver's Contact Information: [phone] Availability: 24/7 Discharge Plan Discussed with Primary Caregiver: Yes Is Caregiver In Agreement with Plan?: Yes Does Caregiver/Family have Issues with Lodging/Transportation while Pt is in Rehab?: No   Goals Patient/Family Goal for Rehab: Supervision: PT/OT/ST Expected length of stay: 13-20 days Pt/Family Agrees to Admission and willing to participate: Yes Program Orientation Provided & Reviewed with  Pt/Caregiver Including Roles  & Responsibilities:  Yes   Decrease burden of Care through IP rehab admission: NA   Possible need for SNF placement upon discharge:Not anticipated   Patient Condition: This patient's medical and functional status has changed since the consult dated: 04/02/21 in which the Rehabilitation Physician determined and documented that the patient's condition is appropriate for intensive rehabilitative care in an inpatient rehabilitation facility. See "History of Present Illness" (above) for medical update. Functional changes are: Pt currently Min G with transfers, Min A 120' with gait, and Mod A with ADLs. Patient's medical and functional status update has been discussed with the Rehabilitation physician and patient remains appropriate for inpatient rehabilitation. Will admit to inpatient rehab today   Preadmission Screen Completed By:  Bethel Born, CCC-SLP, 04/05/2021 3:40 PM ______________________________________________________________________   Discussed status with Dr. Naaman Plummer on 04/06/2021 at  1012 and received approval for admission today.   Admission Coordinator:  Bethel Born, time [id number] Date 04/06/2021             Cosigned by: [name], MD at 04/06/2021 10:20 AM    Revision History                                            Note Details  Author Cristina Gong, RN File Time 04/06/2021 10:13 AM  Author Type Rehab Admission Coordinator Status Signed  Last Editor Cristina Gong, RN Service Physical Medicine and Rehabilitation

## 2021-04-06 NOTE — Progress Notes (Signed)
Shannon Staggers, MD  Physician  Physical Medicine and Rehabilitation  Consult Note     Signed  Date of Service:  04/02/2021  1:15 PM      Related encounter: ED to Hosp-Admission (Current) from 03/25/2021 in [place] All Collapse All     Show:Clear all [x] Manual[x] Template[] Copied  Added by: [x] Shannon Staggers, MD   [] Hover for details           Physical Medicine and Rehabilitation Consult Reason for Consult:balance problems. Left sided weakness Referring Physician: Dawley     HPI: Shannon Horton. is a 75 y.o. male who presented to [facility] on May 26 with headaches and balance deficits for 4 to 6 weeks.  MRI revealed a 3.8 cm enhancing and hemorrhagic mass in the right basal ganglia/frontal lobe region with 5 mm left midline shift.  CT of the chest and abdomen was notable for left upper pulmonary nodule, left hilar adenopathy right upper lobe pulmonary nodule left lower quadrant mass arising from the right adrenal gland, hepatic lesions.  CT-guided biopsy of the adrenal mass on 5/31 was positive for malignancy.  Oncology is following.  Patient underwent a right frontal craniotomy on 6/1.     ROS     Past Medical History:  Diagnosis Date  . Allergic rhinitis      gets shots per Dr. Velora Heckler  . Allergy      sees Dr. Harold Hedge   . Asthma    . Glaucoma      sees Dr. Satira Sark   . Hypertension    . Malignant melanoma ([place])      right lower leg, diagnosed on1/21/11         Past Surgical History:  Procedure Laterality Date  . APPLICATION OF CRANIAL NAVIGATION N/A 03/31/2021    Procedure: APPLICATION OF CRANIAL NAVIGATION;  Surgeon: Dawley, Theodoro Doing, DO;  Location: [place];  Service: Neurosurgery;  Laterality: N/A;  . CATARACT EXTRACTION        right ey, Dr. Satira Sark   . COLONOSCOPY   12/15/2015    per Dr.Kaplan, clear, no repeats   . CRANIOTOMY N/A 03/31/2021    Procedure: RIGHT FRONTAL CRANIOTOMY FOR  TUMOR;  Surgeon: Dawley, Theodoro Doing, DO;  Location: [place];  Service: Neurosurgery;  Laterality: N/A;  . ESOPHAGOGASTRODUODENOSCOPY        WITH ESOPHAGEAL DILATION 2006 PER dR. kAPLAN  . REMOVAL  of melanoma   12/08/09    from right calf; per Dr. Monica Becton  . removal of melanoma from left abdomen   01/21/10     per Dr. Meredith Leeds  . SQUAMOUS CELL CARCINOMA EXCISION   2013    per Dr. Terrence Dupont, from top of scalp          Family History  Problem Relation Age of Onset  . Hypertension Other    . Colon polyps Mother    . Hypertension Mother    . Diabetes Father    . Liver disease Maternal Uncle    . Cancer Brother          throat and lung (smoker)  . Colon cancer Neg Hx    . Stomach cancer Neg Hx    . Esophageal cancer Neg Hx    . Pancreatic cancer Neg Hx      Social History:  reports that he has quit smoking. He has never used  smokeless tobacco. He reports current alcohol use of about 5.0 - 10.0 standard drinks of alcohol per week. He reports that he does not use drugs. Allergies:      Allergies  Allergen Reactions  . Banana Rash          Medications Prior to Admission  Medication Sig Dispense Refill  . acetaminophen (TYLENOL) 500 MG tablet Take 1,000 mg by mouth every 6 (six) hours as needed for mild pain.      Marland Kitchen albuterol (PROVENTIL HFA;VENTOLIN HFA) 108 (90 Base) MCG/ACT inhaler Inhale 2 puffs into the lungs every 4 (four) hours as needed for wheezing or shortness of breath. 8.5 Inhaler 0  . amLODipine (NORVASC) 5 MG tablet Take 1 tablet (5 mg total) by mouth daily. 90 tablet 3  . aspirin 325 MG EC tablet Take 325 mg by mouth every 6 (six) hours as needed for pain (alt with ibuprofen).      Marland Kitchen aspirin 81 MG EC tablet Take 81 mg by mouth daily.      . bimatoprost (LUMIGAN) 0.03 % ophthalmic solution Place 1 drop into both eyes at bedtime.      . brinzolamide (AZOPT) 1 % ophthalmic suspension Place 1 drop into both eyes 2 (two) times daily.      . Coenzyme Q10 200 MG capsule Take 200  mg by mouth daily.      Marland Kitchen EPINEPHrine 0.3 mg/0.3 mL IJ SOAJ injection Inject 0.3 mg into the muscle See admin instructions.      . Fluticasone Furoate (ARNUITY ELLIPTA) 200 MCG/ACT AEPB Inhale 1 puff into the lungs daily.      Marland Kitchen ibuprofen (ADVIL) 200 MG tablet Take 200-400 mg by mouth every 6 (six) hours as needed for headache.      . Multiple Vitamin (MULTIVITAMIN) tablet Take 1 tablet by mouth daily. One A Day      . telmisartan-hydrochlorothiazide (MICARDIS HCT) 80-25 MG tablet TAKE 1 TABLET BY MOUTH EVERY DAY 90 tablet 3  . UNABLE TO FIND Allergy shots at [facility] every month          Home: [place] expects to be discharged to:: Private residence Living Arrangements: Spouse/significant other Available Help at Discharge: Family,Available 24 hours/day Type of Home: House Home Access: Stairs to enter [employer] of Steps: 1 Entrance Stairs-Rails: Right Home Layout: Two level,Able to live on main level with bedroom/bathroom Bathroom Shower/Tub: Multimedia programmer: Standard Bathroom Accessibility: Yes Home Equipment: Environmental consultant - 2 wheels,Cane - single point,Hand held shower head  Functional History: Prior Function Level of Independence: Independent with assistive device(s) Comments: pt reports using cane progressing to RW for ambulation intermittently PTA, recent history of multiple falls over the past 6 weeks. Prior to 6 weeks ago, pt biking 20+ miles on bike. Functional Status:  Mobility: Bed Mobility Overal bed mobility: Needs Assistance Bed Mobility: Supine to Sit Supine to sit: Min assist,HOB elevated,+2 for safety/equipment General bed mobility comments: min assist for completion of trunk elevation off of bed, progression LEs to EOB. Increased time, sequential cuing needed. Transfers Overall transfer level: Needs assistance Equipment used: 2 person hand held assist Transfers: Sit to/from [place] to Stand: Mod assist,+2 physical assistance Stand pivot transfers: Mod assist,+2 physical assistance General transfer comment: Mod +2 for power up, rise, steadying, and slow eccentric lower into recliner; cues for upright posture and "big steps" when pivoting to recliner as pt with shuffling-type gait. Ambulation/Gait General Gait Details: transfer  only   ADL: ADL Overall ADL's : Needs assistance/impaired Eating/Feeding: Set up,Supervision/ safety,Sitting Grooming: Set up,Supervision/safety,Sitting Upper Body Bathing: Set up,Sitting Lower Body Bathing: Moderate assistance,Sit to/from stand Upper Body Dressing : Minimal assistance,Sitting Lower Body Dressing: Moderate assistance,Sit to/from stand Toilet Transfer: Moderate assistance,+2 for physical assistance,Stand-pivot (took several steps) Toileting- Clothing Manipulation and Hygiene: Moderate assistance Functional mobility during ADLs: Moderate assistance,+2 for safety/equipment   Cognition: Cognition Overall Cognitive Status: Impaired/Different from baseline Orientation Level: Oriented X4 Cognition Arousal/Alertness: Awake/alert Behavior During Therapy: Flat affect Overall Cognitive Status: Impaired/Different from baseline Area of Impairment: Attention,Following commands,Safety/judgement,Problem solving Current Attention Level: Sustained Memory: Decreased short-term memory Following Commands: Follows one step commands with increased time Safety/Judgement: Decreased awareness of safety,Decreased awareness of deficits Awareness: Emergent Problem Solving: Slow processing,Decreased initiation,Difficulty sequencing,Requires verbal cues,Requires tactile cues General Comments: Pt with increased processing time to initate tasks and verbally respond to PT/OT. Pt with heavy L lateral leaning sitting EOB, requires repeated cuing to both be aware of and correct this. Step-by-step cuing for mobility tasks   Blood pressure (!)  127/59, pulse 64, temperature (!) 97.4 F (36.3 C), temperature source Oral, resp. rate 13, height 5\' 8"  (1.727 m), weight 70.5 kg, SpO2 97 %. Physical Exam   Lab Results Last 24 Hours  No results found for this or any previous visit (from the past 24 hour(s)).    Imaging Results (Last 48 hours)  MR BRAIN W WO CONTRAST   Result Date: 04/01/2021 CLINICAL DATA:  Postop day 1 status post resection of a right basal ganglia region mass. Recent adrenal biopsy demonstrating metastatic melanoma. EXAM: MRI HEAD WITHOUT AND WITH CONTRAST TECHNIQUE: Multiplanar, multiecho pulse sequences of the brain and surrounding structures were obtained without and with intravenous contrast. CONTRAST:  39mL GADAVIST GADOBUTROL 1 MMOL/ML IV SOLN COMPARISON:  03/26/2021 FINDINGS: Brain: Sequelae of interval right frontal craniotomy and mass resection are identified. There is a resection cavity in the right basal ganglia containing blood products with surgical tract extending through the anterolateral frontal lobe. There is a small amount of partially nodular intrinsic T1 hyperintensity along the margins of the resection cavity suggesting blood products. Faint enhancement is also present along the periphery of the resection cavity. There is a 1.8 cm region of intensely restricted diffusion immediately superior to the resection cavity involving portions of the right corona radiata and caudate nucleus compatible with an acute infarct. Small volume pneumocephalus is noted, and there is a small extra-axial fluid collection over the right frontal convexity subjacent to the craniotomy measuring 7 mm in thickness. There is moderate residual edema throughout the right basal ganglia region extending into the frontal greater than temporal lobes which has decreased from the prior study. 5 mm of leftward midline shift is unchanged. A 5 mm lesion in the right caudate head demonstrating both intrinsic T1 hyperintensity and enhancement is unchanged  (series 10, image 33). The smaller enhancing focus in the right caudate nucleus slightly more inferiorly on the prior MRI is less conspicuous on today's study. There is 6 mm heterogeneously enhancing nodular focus in the choroid plexus in the left lateral ventricle at the level of the inferior atrium/posterior temporal horn (series 10, image 26) which is asymmetric compared to the contralateral side. This is unchanged in size from the prior MRI, however there is new susceptibility artifact associated with this lesion consistent with hemorrhage, and there is new trace intraventricular hemorrhage layering in the occipital horn of the left lateral ventricle. Scattered small T2 hyperintensities in the cerebral  white matter bilaterally are nonspecific but compatible with mild chronic small vessel ischemic disease. There is mild cerebral atrophy. Vascular: Major intracranial vascular flow voids are preserved. Skull and upper cervical spine: Right frontal craniotomy with gas and fluid in the scalp. Sinuses/Orbits: Bilateral cataract extraction. Paranasal sinuses and mastoid air cells are clear. Other: None. IMPRESSION: 1. Postoperative changes from interval right basal ganglia tumor resection as above. This will serve as a baseline for future examinations. 2. 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 3. Decreased edema in the right basal ganglia region. Unchanged 5 mm a midline shift. 4. Unchanged 5 mm right caudate lesion. 5. 6 mm nodular hemorrhagic lesion in the choroid plexus of the left lateral ventricle suspicious for a metastasis with trace intraventricular hemorrhage. Electronically Signed   By: Logan Bores M.D.   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 Primary carcinoma is unknown. 1. Does the need for close, 24 hr/day medical supervision in concert with the patient's  rehab needs make it unreasonable for this patient to be served in a less intensive setting? Yes 2. Co-Morbidities requiring supervision/potential complications: pain mgt, oncology considerations, HTN 3. Due to bladder management, bowel management, safety, skin/wound care, disease management, medication administration, pain management and patient education, does the patient require 24 hr/day rehab nursing? Yes 4. Does the patient require coordinated care of a physician, rehab nurse, therapy disciplines of PT, OT , SLP to address physical and functional deficits in the context of the above medical diagnosis(es)? Yes Addressing deficits in the following areas: balance, endurance, locomotion, strength, transferring, bowel/bladder control, bathing, dressing, feeding, grooming, toileting and psychosocial support 5. Can the patient actively participate in an intensive therapy program of at least 3 hrs of therapy per day at least 5 days per week? Yes 6. The potential for patient to make measurable gains while on inpatient rehab is good 7. Anticipated functional outcomes upon discharge from inpatient rehab are supervision  with PT, supervision with OT, supervision with SLP. 8. Estimated rehab length of stay to reach the above functional goals is: 13-20 days 9. Anticipated discharge destination: Home 10. Overall Rehab/Functional Prognosis: good   RECOMMENDATIONS: This patient's condition is appropriate for continued rehabilitative care in the following setting: CIR Patient has agreed to participate in recommended program. Potentially Note that insurance prior authorization may be required for reimbursement for recommended care.   Comment: Shannon Staggers, MD, [place] [facility] 04/02/2021      Shannon Staggers, MD 04/02/2021             Routing History                        Note Details  Author Shannon Staggers, MD File Time 04/02/2021  1:21 PM  Author  Type Physician Status Signed  Last Editor Shannon Staggers, MD Service Physical Medicine and Rehabilitation

## 2021-04-06 NOTE — H&P (Signed)
Physical Medicine and Rehabilitation Admission H&P    Chief Complaint  Patient presents with  . Headache  : HPI: Shannon Horton is a 75 year old right-handed male history of glaucoma followed by Dr. Satira Sark, hypertension, tobacco use, remote  melanoma x2 removed from his right calf and left flank 2011 with negative nodal biopsy.  Per chart review patient lives with spouse.  Two-level home bed and bath main level with one-step to entry.  Independent with assistive device and reported multiple falls over the past 6 weeks.  Prior to 6 weeks ago patient independent and active.  Presented 03/25/2021 with persistent headaches balance deficits x6 weeks, left hemiparesis as well as shuffling gait.  MRI of the brain showed a 3.8 cm enhancing and hemorrhagic mass in the right basal ganglia/frontal lobe region with a 5 mm left midline shift.  CT of the chest and abdomen was notable for left upper pulmonary nodule, left hilar adenopathy right upper lobe pulmonary nodule left lower quadrant mass arising from the right adrenal gland, hepatic lesions.  CT-guided biopsy of the adrenal mass 03/30/2021 positive for malignancy.  Patient underwent right craniotomy resection of tumor 03/31/2021 per Dr. Reatha Armour.  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  Brain surgical tissue pathology remains pending with follow-up per oncology services Dr. Lindi Adie as well as Dr. Alen Blew..  Decadron protocol as indicated.  Maintained on Keppra for seizure prophylaxis.  Patient was cleared to begin subcutaneous heparin for DVT prophylaxis 04/02/2021.  Tolerating a regular consistency diet.  Therapy evaluations completed due to patient decreased functional mobility/left hemiparesis was admitted for a comprehensive rehab program.  Review of Systems  Constitutional: Positive for malaise/fatigue. Negative for chills and fever.  Eyes: Positive for blurred vision. Negative for double vision.  Respiratory: Negative for  cough and shortness of breath.   Cardiovascular: Negative for chest pain, palpitations and leg swelling.  Gastrointestinal: Positive for constipation. Negative for heartburn, nausea and vomiting.  Genitourinary: Negative for dysuria, flank pain and hematuria.  Musculoskeletal: Positive for falls, joint pain and myalgias.  Skin: Negative for rash.  Neurological: Positive for dizziness, weakness and headaches.  All other systems reviewed and are negative.  Past Medical History:  Diagnosis Date  . Allergic rhinitis    gets shots per Dr. Velora Heckler  . Allergy    sees Dr. Harold Hedge   . Asthma   . Glaucoma    sees Dr. Satira Sark   . Hypertension   . Malignant melanoma ([place])    right lower leg, diagnosed on1/21/11   Past Surgical History:  Procedure Laterality Date  . APPLICATION OF CRANIAL NAVIGATION N/A 03/31/2021   Procedure: APPLICATION OF CRANIAL NAVIGATION;  Surgeon: Dawley, Theodoro Doing, DO;  Location: [place];  Service: Neurosurgery;  Laterality: N/A;  . CATARACT EXTRACTION     right ey, Dr. Satira Sark   . COLONOSCOPY  12/15/2015   per Dr.Kaplan, clear, no repeats   . CRANIOTOMY N/A 03/31/2021   Procedure: RIGHT FRONTAL CRANIOTOMY FOR TUMOR;  Surgeon: Dawley, Theodoro Doing, DO;  Location: [place];  Service: Neurosurgery;  Laterality: N/A;  . ESOPHAGOGASTRODUODENOSCOPY     WITH ESOPHAGEAL DILATION 2006 PER dR. kAPLAN  . REMOVAL  of melanoma  12/08/09   from right calf; per Dr. Monica Becton  . removal of melanoma from left abdomen  01/21/10    per Dr. Meredith Leeds  . SQUAMOUS CELL CARCINOMA EXCISION  2013   per Dr. Terrence Dupont, from top of scalp    Family History  Problem Relation Age of Onset  . Hypertension Other   . Colon polyps Mother   . Hypertension Mother   . Diabetes Father   . Liver disease Maternal Uncle   . Cancer Brother        throat and lung (smoker)  . Colon cancer Neg Hx   . Stomach cancer Neg Hx   . Esophageal cancer Neg Hx   . Pancreatic cancer Neg Hx    Social History:  reports that  he has quit smoking. He has never used smokeless tobacco. He reports current alcohol use of about 5.0 - 10.0 standard drinks of alcohol per week. He reports that he does not use drugs. Allergies:  Allergies  Allergen Reactions  . Banana Rash   Medications Prior to Admission  Medication Sig Dispense Refill  . acetaminophen (TYLENOL) 500 MG tablet Take 1,000 mg by mouth every 6 (six) hours as needed for mild pain.    Marland Kitchen albuterol (PROVENTIL HFA;VENTOLIN HFA) 108 (90 Base) MCG/ACT inhaler Inhale 2 puffs into the lungs every 4 (four) hours as needed for wheezing or shortness of breath. 8.5 Inhaler 0  . amLODipine (NORVASC) 5 MG tablet Take 1 tablet (5 mg total) by mouth daily. 90 tablet 3  . aspirin 325 MG EC tablet Take 325 mg by mouth every 6 (six) hours as needed for pain (alt with ibuprofen).    Marland Kitchen aspirin 81 MG EC tablet Take 81 mg by mouth daily.    . bimatoprost (LUMIGAN) 0.03 % ophthalmic solution Place 1 drop into both eyes at bedtime.    . brinzolamide (AZOPT) 1 % ophthalmic suspension Place 1 drop into both eyes 2 (two) times daily.    . Coenzyme Q10 200 MG capsule Take 200 mg by mouth daily.    Marland Kitchen EPINEPHrine 0.3 mg/0.3 mL IJ SOAJ injection Inject 0.3 mg into the muscle See admin instructions.    . Fluticasone Furoate (ARNUITY ELLIPTA) 200 MCG/ACT AEPB Inhale 1 puff into the lungs daily.    Marland Kitchen ibuprofen (ADVIL) 200 MG tablet Take 200-400 mg by mouth every 6 (six) hours as needed for headache.    . Multiple Vitamin (MULTIVITAMIN) tablet Take 1 tablet by mouth daily. One A Day    . telmisartan-hydrochlorothiazide (MICARDIS HCT) 80-25 MG tablet TAKE 1 TABLET BY MOUTH EVERY DAY 90 tablet 3  . UNABLE TO FIND Allergy shots at [facility] every month      Drug Regimen Review Drug regimen was reviewed and remains appropriate with no significant issues identified  Home: Home Living Family/patient expects to be discharged to:: Private residence Living Arrangements:  Spouse/significant other Available Help at Discharge: Family,Available 24 hours/day Type of Home: House Home Access: Stairs to enter [employer] of Steps: 1 Entrance Stairs-Rails: None Home Layout: One level,Other (Comment) (bonus room above garage) Bathroom Shower/Tub: Walk-in shower,Tub/shower unit [employer]: Standard Bathroom Accessibility: Yes Home Equipment: Environmental consultant - 2 wheels,Cane - single point,Hand held shower head  Lives With: Spouse   Functional History: Prior Function Level of Independence: Independent with assistive device(s) Comments: pt reports using cane progressing to RW for ambulation intermittently PTA, recent history of multiple falls over the past 6 weeks. Prior to 6 weeks ago, pt biking 20+ miles on bike.  Functional Status:  Mobility: Bed Mobility Overal bed mobility: Modified Independent Bed Mobility: Supine to Sit Supine to sit: Modified independent (Device/Increase time) General bed mobility comments: no physcial assist needed, use of bed features Transfers Overall transfer level: Needs  assistance Equipment used: Rolling walker (2 wheeled) Transfers: Sit to/from Stand Sit to Stand: Min assist Stand pivot transfers: Mod assist,+2 physical assistance General transfer comment: posterior lean noted upon initial stand but improved as session progressed, pt impulsively stood from toilet with no physcial assist Ambulation/Gait Ambulation/Gait assistance: Min assist Gait Distance (Feet): 120 Feet Assistive device: Rolling walker (2 wheeled) Gait Pattern/deviations: Step-through pattern,Decreased stride length,Trunk flexed,Shuffle General Gait Details: Mod-max cues for posture and RW proximity and increased foot clearance. Pt unable to monitor for self fatigue, with decreasing foot clearance with distance. MinA for balance and cues for sequencing/direction Gait velocity: decreased Gait velocity interpretation: <1.8 ft/sec, indicate of risk for  recurrent falls    ADL: ADL Overall ADL's : Needs assistance/impaired Eating/Feeding: Set up,Supervision/ safety,Sitting Grooming: Wash/dry hands,Standing,Min guard,Supervision/safety Upper Body Bathing: Set up,Sitting Lower Body Bathing: Moderate assistance,Sit to/from stand Upper Body Dressing : Moderate assistance,Sitting Upper Body Dressing Details (indicate cue type and reason): to don new gown Lower Body Dressing: Moderate assistance,Sit to/from stand Toilet Transfer: Minimal assistance,RW,Ambulation,Regular Toilet,Grab bars,Cueing for safety Toilet Transfer Details (indicate cue type and reason): MIN a for balance when ambulating to BR, LOB in BR needing MIN A to correct, cues for safety awareness Toileting- Clothing Manipulation and Hygiene: Moderate assistance Functional mobility during ADLs: Minimal assistance,Min guard,Rolling walker,Cueing for safety General ADL Comments: pt continues to present with impaired balance and impaired safety awareness  Cognition: Cognition Overall Cognitive Status: Impaired/Different from baseline Orientation Level: Oriented X4 Cognition Arousal/Alertness: Awake/alert Behavior During Therapy: WFL for tasks assessed/performed,Impulsive Overall Cognitive Status: Impaired/Different from baseline Area of Impairment: Attention,Safety/judgement,Problem solving Current Attention Level: Selective Memory: Decreased recall of precautions,Decreased short-term memory Following Commands: Follows one step commands consistently Safety/Judgement: Decreased awareness of safety,Decreased awareness of deficits Awareness: Emergent Problem Solving: Slow processing,Decreased initiation,Difficulty sequencing,Requires verbal cues,Requires tactile cues General Comments: minimal awareness noted to LOB, cues for safety awareness as pt impulsively sit<>stand from toilet despite education on waiting for OTA to assist pt  Physical Exam: Blood pressure 115/60, pulse (!)  50, temperature (!) 96.9 F (36.1 C), temperature source Axillary, resp. rate 14, height 5\' 8"  (1.727 m), weight 70.5 kg, SpO2 91 %. Physical Exam HENT:     Head:     Comments: Craniotomy site clean and dry.  There was some clotted blood on mid incision.    Mouth/Throat:     Mouth: Mucous membranes are moist.  Eyes:     Extraocular Movements: Extraocular movements intact.     Pupils: Pupils are equal, round, and reactive to light.     Comments: Decreased visual acuity OD. No obvious opacification, discoloration, trauma noted OD  Cardiovascular:     Rate and Rhythm: Normal rate and regular rhythm.     Heart sounds: No murmur heard. No gallop.   Pulmonary:     Effort: Pulmonary effort is normal. No respiratory distress.     Breath sounds: No wheezing.  Abdominal:     General: There is no distension.     Palpations: Abdomen is soft.     Tenderness: There is no guarding.  Musculoskeletal:        General: No swelling.     Cervical back: Normal range of motion and neck supple.  Skin:    General: Skin is warm.     Comments: Hands are ice cold  Neurological:     Mental Status: He is alert.     Comments: Patient is alert.  No acute distress.  Makes eye contact with examiner.  Provides name and age.  Follows simple commands. RUE 4+/5. LUE 4/5. RLE and RLE 4/5 prox to distal. Decreased fmc on left. Mild left inattention. Speech clear. Normal language. Fair insight and awareness.      No results found for this or any previous visit (from the past 48 hour(s)). No results found.     Medical Problem List and Plan: 1.  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  Follow-up oncology services.  -patient may shower  -ELOS/Goals: 13-20 days, supervision for PT, OT, SLP 2.  Antithrombotics: -DVT/anticoagulation: Subcutaneous heparin.  -antiplatelet therapy: N/A 3. Pain  Management: Hydrocodone as needed 4. Mood: Xanax 0.25 mg 3 times daily as needed  -antipsychotic agents: N/A 5. Neuropsych: This patient is capable of making decisions on his own behalf. 6. Skin/Wound Care: Routine skin checks  -keep scalp clean and dry 7. Fluids/Electrolytes/Nutrition: Routine in and outs with follow-up chemistries 8.  Hypertension.  Norvasc 5 mg daily, Avapro 300 mg daily, HCTZ 25 mg daily.  Monitor with increased mobility  -adjust regimen as needed 9.  Seizure prophylaxis.  Keppra 500 mg twice daily 10.  Glaucoma.  Followed by Dr. Satira Sark.  Continue eyedrops. 11.  History of asthma.  Continue inhalers as directed.  Check oxygen saturations every shift 12.  Constipation.    -no bm since admit  -miralax bid  -senna-s at bedtime    Cathlyn Parsons, PA-C 04/06/2021

## 2021-04-06 NOTE — Progress Notes (Signed)
Redness to sacrum, blanchable. Foam dressing applied.

## 2021-04-06 NOTE — TOC Transition Note (Signed)
Transition of Care (TOC) - CM/SW Discharge Note Marvetta Gibbons RN,BSN Transitions of Care Unit 4NP (non trauma) - RN Case Manager See Treatment Team for direct Phone #   Patient Details  Name: Shannon Horton. MRN: [id number] Date of Birth: 08-17-1946  Transition of Care [facility]) CM/SW Contact:  Dawayne Patricia, RN Phone Number: 04/06/2021, 3:28 PM   Clinical Narrative:    Have received notification from CIR that pt has insurance auth for [facility] rehab admission and bed available today. Pt stable for transition to [facility] rehab, MD agreeable and will place d/c order.  Pt will transfer later today to [place] rehab.   Final next level of care: IP Rehab Facility Barriers to Discharge: No Barriers Identified   Patient Goals and CMS Choice Patient states their goals for this hospitalization and ongoing recovery are:: rehab CMS Medicare.gov Compare Post Acute Care list provided to:: Patient Choice offered to / list presented to : Patient  Discharge Placement                  [facility] rehab      Discharge Plan and Services     Post Acute Care Choice: IP Rehab          DME Arranged: N/A DME Agency: NA       HH Arranged: NA HH Agency: NA        Social Determinants of Health (SDOH) Interventions     Readmission Risk Interventions Readmission Risk Prevention Plan 04/06/2021  Post Dischage Appt Complete  Medication Screening Complete  Transportation Screening Complete  Some recent data might be hidden

## 2021-04-07 ENCOUNTER — Telehealth: Payer: Self-pay | Admitting: Oncology

## 2021-04-07 ENCOUNTER — Other Ambulatory Visit (HOSPITAL_COMMUNITY): Payer: Medicare PPO

## 2021-04-07 DIAGNOSIS — E871 Hypo-osmolality and hyponatremia: Secondary | ICD-10-CM

## 2021-04-07 LAB — CBC WITH DIFFERENTIAL/PLATELET
Abs Immature Granulocytes: 0.05 10*3/uL (ref 0.00–0.07)
Basophils Absolute: 0 10*3/uL (ref 0.0–0.1)
Basophils Relative: 0 %
Eosinophils Absolute: 0.1 10*3/uL (ref 0.0–0.5)
Eosinophils Relative: 1 %
HCT: 40.5 % (ref 39.0–52.0)
Hemoglobin: 14 g/dL (ref 13.0–17.0)
Immature Granulocytes: 1 %
Lymphocytes Relative: 23 %
Lymphs Abs: 1.7 10*3/uL (ref 0.7–4.0)
MCH: 31.5 pg (ref 26.0–34.0)
MCHC: 34.6 g/dL (ref 30.0–36.0)
MCV: 91.2 fL (ref 80.0–100.0)
Monocytes Absolute: 0.6 10*3/uL (ref 0.1–1.0)
Monocytes Relative: 8 %
Neutro Abs: 4.8 10*3/uL (ref 1.7–7.7)
Neutrophils Relative %: 67 %
Platelets: 219 10*3/uL (ref 150–400)
RBC: 4.44 MIL/uL (ref 4.22–5.81)
RDW: 12.3 % (ref 11.5–15.5)
WBC: 7.2 10*3/uL (ref 4.0–10.5)
nRBC: 0 % (ref 0.0–0.2)

## 2021-04-07 LAB — COMPREHENSIVE METABOLIC PANEL
ALT: 27 U/L (ref 0–44)
AST: 16 U/L (ref 15–41)
Albumin: 2.8 g/dL — ABNORMAL LOW (ref 3.5–5.0)
Alkaline Phosphatase: 57 U/L (ref 38–126)
Anion gap: 8 (ref 5–15)
BUN: 17 mg/dL (ref 8–23)
CO2: 28 mmol/L (ref 22–32)
Calcium: 8.8 mg/dL — ABNORMAL LOW (ref 8.9–10.3)
Chloride: 95 mmol/L — ABNORMAL LOW (ref 98–111)
Creatinine, Ser: 0.74 mg/dL (ref 0.61–1.24)
GFR, Estimated: >60 mL/min (ref >60)
Glucose, Bld: 117 mg/dL — ABNORMAL HIGH (ref 70–99)
Potassium: 4.1 mmol/L (ref 3.5–5.1)
Sodium: 131 mmol/L — ABNORMAL LOW (ref 135–145)
Total Bilirubin: 0.9 mg/dL (ref 0.3–1.2)
Total Protein: 4.8 g/dL — ABNORMAL LOW (ref 6.5–8.1)

## 2021-04-07 MED ORDER — SORBITOL 70 % SOLN
60.0000 mL | Status: AC
  Administered 2021-04-07: 60 mL via ORAL
  Filled 2021-04-07: qty 60

## 2021-04-07 NOTE — Evaluation (Signed)
Speech Language Pathology Assessment and Plan  Patient Details  Name: Shannon Horton. MRN: [id number] Date of Birth: 14-Jan-1946  SLP Diagnosis: Cognitive Impairments  Rehab Potential: Excellent ELOS: 10-14    Today's Date: 04/07/2021 SLP Individual Time: 1300-1400 SLP Individual Time Calculation (min): 60 min   Hospital Problem: Principal Problem:   Brain tumor [facility])  Past Medical History:  Past Medical History:  Diagnosis Date  . Allergic rhinitis    gets shots per Dr. Velora Heckler  . Allergy    sees Dr. Harold Hedge   . Asthma   . Glaucoma    sees Dr. Satira Sark   . Hypertension   . Malignant melanoma ([place])    right lower leg, diagnosed on1/21/11   Past Surgical History:  Past Surgical History:  Procedure Laterality Date  . APPLICATION OF CRANIAL NAVIGATION N/A 03/31/2021   Procedure: APPLICATION OF CRANIAL NAVIGATION;  Surgeon: Dawley, Theodoro Doing, DO;  Location: [place];  Service: Neurosurgery;  Laterality: N/A;  . CATARACT EXTRACTION     right ey, Dr. Satira Sark   . COLONOSCOPY  12/15/2015   per Dr.Kaplan, clear, no repeats   . CRANIOTOMY N/A 03/31/2021   Procedure: RIGHT FRONTAL CRANIOTOMY FOR TUMOR;  Surgeon: Dawley, Theodoro Doing, DO;  Location: [place];  Service: Neurosurgery;  Laterality: N/A;  . ESOPHAGOGASTRODUODENOSCOPY     WITH ESOPHAGEAL DILATION 2006 PER dR. kAPLAN  . REMOVAL  of melanoma  12/08/09   from right calf; per Dr. Monica Becton  . removal of melanoma from left abdomen  01/21/10    per Dr. Meredith Leeds  . SQUAMOUS CELL CARCINOMA EXCISION  2013   per Dr. Terrence Dupont, from top of scalp     Assessment / Plan / Recommendation Clinical Impression   Shannon Horton is a 75 year old right-handed male history of glaucoma, hypertension, tobacco use, remote melanoma x2 removed from his right calf and left flank 2011 with negative nodal biopsy. Per chart review patient lives with spouse. Two-level home bed and bath main level with one-step to entry. Independent with assistive  device and reported multiple falls over the past 6 weeks. Prior to 6 weeks ago patient independent and active. Presented 03/25/2021 with persistent headaches balance deficits x6 weeks, left hemiparesis as well as shuffling gait. MRI of the brain showed a 3.8 cm enhancing and hemorrhagic mass in the right basal ganglia/frontal lobe region with a 5 mm left midline shift. CT of the chest and abdomen was notable for left upper pulmonary nodule, left hilar adenopathy right upper lobe pulmonary nodule left lower quadrant mass arising from the right adrenal gland, hepatic lesions. CT-guided biopsy of the adrenal mass 03/30/2021 positive for malignancy. Patient underwent right craniotomy resection of tumor 03/31/2021 per Dr. Reatha Armour. 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  Maintained on Keppra for seizure prophylaxis.Patient was cleared to begin subcutaneous heparin for DVT prophylaxis 04/02/2021. Tolerating a regular consistency diet. Therapy evaluations completed due to patient decreased functional mobility/left hemiparesis was admitted for a comprehensive rehab program.       Skilled Therapeutic Interventions          Per SLUMS cognitive evaluation and further informal assessment, patient displays deficits in the following areas consistent with mild cognitive impairment: - short-term recall - sustained attention - information processing - problem solving  Patient reports difficulty with short-term recall at baseline. He demonstrated awareness of errors during assessment and benefited from additional processing time for repair which was successful ~50% of the time. Patient would benefit from skilled  SLP services to maximize cognitive functioning and overall functional independence prior to discharge.   SLP administered SLUMS cognitive assessment. Please see above for details    SLP Assessment  Patient will need skilled [place] [facility] during CIR admission     Recommendations  Recommendations for Other Services: Neuropsych consult Patient destination: Home Follow up Recommendations: Outpatient SLP;24 hour supervision/assistance Equipment Recommended: None recommended by SLP    SLP Frequency 3 to 5 out of 7 days   SLP Duration  SLP Intensity  SLP Treatment/Interventions 10-14  Minumum of 1-2 x/day, 30 to 90 minutes  Cueing hierarchy;Functional tasks;Patient/family education;Internal/external aids    Pain Pain Assessment Pain Scale: 0-10 Pain Score: 0-No pain  Prior Functioning Type of Home: House  Lives With: Spouse Available Help at Discharge: Family;Available 24 hours/day  SLP Evaluation Cognition Overall Cognitive Status: Impaired/Different from baseline Arousal/Alertness: Awake/alert Orientation Level: Oriented X4 Attention: Sustained Sustained Attention: Impaired Sustained Attention Impairment: Verbal complex;Functional complex Memory: Impaired Memory Impairment: Decreased short term memory Decreased Short Term Memory: Verbal basic;Functional basic Awareness: Appears intact Awareness Impairment: Emergent impairment Problem Solving: Impaired Problem Solving Impairment: Verbal complex;Functional complex Safety/Judgment: Impaired  Comprehension Auditory Comprehension Overall Auditory Comprehension: Appears within functional limits for tasks assessed Expression Verbal Expression Overall Verbal Expression: Appears within functional limits for tasks assessed Written Expression Dominant Hand: Right Oral Motor Oral Motor/Sensory Function Overall Oral Motor/Sensory Function: Within functional limits Motor Speech Overall Motor Speech: Appears within functional limits for tasks assessed  Care Tool Care Tool Cognition Expression of Ideas and Wants Expression of Ideas and Wants: Some difficulty - exhibits some difficulty with expressing needs and ideas (e.g, some words or finishing thoughts) or speech is not clear    Understanding Verbal and Non-Verbal Content Understanding Verbal and Non-Verbal Content: Usually understands - understands most conversations, but misses some part/intent of message. Requires cues at times to understand   Memory/Recall Ability *first 3 days only Memory/Recall Ability *first 3 days only: Current season;That he or she is in a hospital/hospital unit;Location of own room     Short Term Goals: Week 1: SLP Short Term Goal 1 (Week 1): Patient will recall new, functional daily information with Min verbal and visual cues SLP Short Term Goal 2 (Week 1): Pt will increase sustained attention for functional tasks to 15 minutes provided min A cues SLP Short Term Goal 3 (Week 1): Pt will complete mildly complex problem solving tasks with 75% accuracy and Min A cues  Refer to Care Plan for Long Term Goals  Recommendations for other services: Neuropsych  Discharge Criteria: Patient will be discharged from SLP if patient refuses treatment 3 consecutive times without medical reason, if treatment goals not met, if there is a change in medical status, if patient makes no progress towards goals or if patient is discharged from hospital.  The above assessment, treatment plan, treatment alternatives and goals were discussed and mutually agreed upon: by patient  Patty Sermons 04/07/2021, 4:13 PM

## 2021-04-07 NOTE — Telephone Encounter (Signed)
Pt scheduled for new pt appt per 6/7 inbasket msg from MD. Pt aware.

## 2021-04-07 NOTE — Progress Notes (Signed)
Inpatient Rehabilitation  Patient information reviewed and entered into eRehab system by Orella Cushman M. Anda Sobotta, M.A., CCC/SLP, PPS Coordinator.  Information including medical coding, functional ability and quality indicators will be reviewed and updated through discharge.    

## 2021-04-07 NOTE — Evaluation (Signed)
Occupational Therapy Assessment and Plan  Patient Details  Name: Shannon Horton. MRN: [id number] Date of Birth: 1946-08-30  OT Diagnosis: abnormal posture, cognitive deficits, hemiplegia affecting non-dominant side and muscle weakness (generalized) Rehab Potential: Rehab Potential (ACUTE ONLY): Good ELOS: 10-14   Today's Date: 04/07/2021 OT Individual Time: 9233-0076 OT Individual Time Calculation (min): 75 min     Hospital Problem: Principal Problem:   Brain tumor [facility])   Past Medical History:  Past Medical History:  Diagnosis Date  . Allergic rhinitis    gets shots per Dr. Velora Heckler  . Allergy    sees Dr. Harold Hedge   . Asthma   . Glaucoma    sees Dr. Satira Sark   . Hypertension   . Malignant melanoma ([place])    right lower leg, diagnosed on1/21/11   Past Surgical History:  Past Surgical History:  Procedure Laterality Date  . APPLICATION OF CRANIAL NAVIGATION N/A 03/31/2021   Procedure: APPLICATION OF CRANIAL NAVIGATION;  Surgeon: Dawley, Theodoro Doing, DO;  Location: [place];  Service: Neurosurgery;  Laterality: N/A;  . CATARACT EXTRACTION     right ey, Dr. Satira Sark   . COLONOSCOPY  12/15/2015   per Dr.Kaplan, clear, no repeats   . CRANIOTOMY N/A 03/31/2021   Procedure: RIGHT FRONTAL CRANIOTOMY FOR TUMOR;  Surgeon: Dawley, Theodoro Doing, DO;  Location: [place];  Service: Neurosurgery;  Laterality: N/A;  . ESOPHAGOGASTRODUODENOSCOPY     WITH ESOPHAGEAL DILATION 2006 PER dR. kAPLAN  . REMOVAL  of melanoma  12/08/09   from right calf; per Dr. Monica Becton  . removal of melanoma from left abdomen  01/21/10    per Dr. Meredith Leeds  . SQUAMOUS CELL CARCINOMA EXCISION  2013   per Dr. Terrence Dupont, from top of scalp     Assessment & Plan Clinical Impression:   75 yo male presents to [facility] on 5/26 with headaches, balance issues with falls x4-6 weeks. MRI brain shows Large, 3.8 cm enhancing and hemorrhagic mass within the right basal ganglia/frontal lobe with 65m L midline shift, concern for metastasis. CT  chest/abdomen/pelvis shows L upper pulmonary nodul, L hilar adenopathy, RUL pulmonary nodule, upper quadrant mass arising from R adrenal gland, hepatic lesions. s/p CT guided biopsy R adrenal mass on 5/31 with pathology + for malignant neoplasm, oncology following. s/p R frontal craniotomy 6/1. PMH includes glaucoma, HTN, malignant melanoma 2011  Patient currently requires min-MOD with basic self-care skills secondary to muscle weakness, decreased cardiorespiratoy endurance, unbalanced muscle activation and decreased coordination, decreased attention, decreased awareness and decreased problem solving and decreased sitting balance, decreased standing balance, decreased postural control, hemiplegia and decreased balance strategies.  Prior to hospitalization, patient could complete BADL with independent .  Patient will benefit from skilled intervention to decrease level of assist with basic self-care skills and increase independence with basic self-care skills prior to discharge home with care partner.  Anticipate patient will require 24 hour supervision and intermittent supervision and follow up outpatient.  OT - End of Session Activity Tolerance: Tolerates 30+ min activity with multiple rests Endurance Deficit: Yes OT Assessment Rehab Potential (ACUTE ONLY): Good OT Barriers to Discharge: Incontinence OT Patient demonstrates impairments in the following area(s): Balance;Cognition;Edema;Endurance;Motor;Perception;Safety;Sensory;Vision OT Basic ADL's Functional Problem(s): Eating;Grooming;Bathing;Dressing;Toileting OT Transfers Functional Problem(s): Toilet;Tub/Shower OT Plan OT Intensity: Minimum of 1-2 x/day, 45 to 90 minutes OT Frequency: 5 out of 7 days OT Duration/Estimated Length of Stay: 10-14 OT Treatment/Interventions: Balance/vestibular training;Discharge planning;Functional electrical stimulation;Pain management;Self Care/advanced ADL retraining;Therapeutic Activities;UE/LE Coordination  activities;Cognitive remediation/compensation;Disease mangement/prevention;Functional mobility  training;Patient/family education;Skin care/wound managment;Therapeutic Exercise;Visual/perceptual remediation/compensation;UE/LE Strength taining/ROM;Splinting/orthotics;Psychosocial support;Neuromuscular re-education;DME/adaptive equipment instruction;Community reintegration OT Self Feeding Anticipated Outcome(s): S OT Basic Self-Care Anticipated Outcome(s): S OT Toileting Anticipated Outcome(s): S OT Bathroom Transfers Anticipated Outcome(s): S OT Recommendation Patient destination: Home Follow Up Recommendations: Outpatient OT Equipment Recommended: 3 in 1 bedside comode;Tub/shower seat   OT Evaluation Precautions/Restrictions  Precautions Precautions: Fall Precaution Comments: R crani Restrictions Weight Bearing Restrictions: No General Chart Reviewed: Yes Family/Caregiver Present: No Vital Signs Therapy Vitals Pulse Rate: (!) 50 BP: 109/61 Pain Pain Assessment Pain Scale: 0-10 Pain Score: 0-No pain Home Living/Prior Functioning Home Living Available Help at Discharge: Family,Available 24 hours/day Type of Home: House Home Access: Stairs to enter Technical brewer of Steps: 1 Entrance Stairs-Rails: None Home Layout: One level,Other (Comment) Bathroom Shower/Tub: Walk-in shower,Tub/shower unit (walk in shower has door, tub has curtain) Technical brewer Accessibility: Yes  Lives With: Spouse IADL History Meal Prep Responsibility: Secondary Laundry Responsibility: Secondary Cleaning Responsibility: Secondary Bill Paying/Finance Responsibility: Secondary Shopping Responsibility: Secondary Child Care Responsibility: No Current License: Yes Occupation: 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 Prior to 6 weeks ago, pt biking 20+ miles on bike. Vision   WNL- could read clock on wall and menu- reporting recent cataract surgery and use of glasses Perception    inattention to L lean/proprioception impaired Poor spatial awareness Praxis   decreased motor planning Cognition Overall Cognitive Status: Impaired/Different from baseline Orientation Level: Person;Place;Situation Person: Oriented Place: Oriented Situation: Oriented Year: 2022 Month: June Day of Week: Correct Immediate Memory Recall: Sock;Blue;Bed Memory Recall Sock: Not able to recall Memory Recall Blue: Without Cue Memory Recall Bed: Without Cue Safety/Judgment: Impaired Sensation Sensation Light Touch: Appears Intact Coordination Gross Motor Movements are Fluid and Coordinated: No Fine Motor Movements are Fluid and Coordinated: No Coordination and Movement Description: decreased [place] on LUE Motor  Motor Motor: Hemiplegia Motor - Skilled Clinical Observations: mild L hemi  Trunk/Postural Assessment  Cervical Assessment Cervical Assessment:  (hea d forward) Thoracic Assessment Thoracic Assessment:  (rounded shoudlers) Lumbar Assessment Lumbar Assessment:  (posterior pelvic tilt) Postural Control Postural Control: Deficits on evaluation (delayed L >R)  Balance Balance Balance Assessed: Yes Dynamic Sitting Balance Dynamic Sitting - Level of Assistance: 4: Min assist Dynamic Sitting Balance - Compensations: L lean in sitting Dynamic Standing Balance Dynamic Standing - Level of Assistance: 4: Min assist Extremity/Trunk Assessment RUE Assessment RUE Assessment: Within Functional Limits LUE Assessment LUE Assessment: Exceptions to [facility] General Strength Comments: decreased L shoulder strengh ROM  Care Tool Care Tool Self Care Eating   Eating Assist  Level: Set up assist    Oral Care    Oral Care Assist Level: Minimal Assistance - Patient > 75% (standing)    Bathing   Body parts bathed by patient: Right arm;Left arm;Chest;Abdomen;Front perineal area;Right upper leg;Left upper leg;Right lower leg;Left lower leg;Face Body parts bathed by helper: Buttocks   Assist Level: Minimal Assistance - Patient > 75%    Upper Body Dressing(including orthotics)   What is the patient wearing?: Pull over shirt   Assist Level: Minimal Assistance - Patient > 75%    Lower Body Dressing (excluding footwear)   What is the patient wearing?: Pants Assist for lower body dressing: Moderate Assistance - Patient 50 -  74%    Putting on/Taking off footwear   What is the patient wearing?: Ted hose;Non-skid slipper socks Assist for footwear: Maximal Assistance - Patient 25 - 49%       Care Tool Toileting Toileting activity   Assist for toileting: Minimal Assistance - Patient > 75%     Care Tool Bed Mobility Roll left and right activity        Sit to lying activity        Lying to sitting edge of bed activity         Care Tool Transfers Sit to stand transfer   Sit to stand assist level: Minimal Assistance - Patient > 75%    Chair/bed transfer   Chair/bed transfer assist level: Minimal Assistance - Patient > 75%     Toilet transfer   Assist Level: Minimal Assistance - Patient > 75%     Care Tool Cognition Expression of Ideas and Wants Expression of Ideas and Wants: Some difficulty - exhibits some difficulty with expressing needs and ideas (e.g, some words or finishing thoughts) or speech is not clear   Understanding Verbal and Non-Verbal Content Understanding Verbal and Non-Verbal Content: Usually understands - understands most conversations, but misses some part/intent of message. Requires cues at times to understand   Memory/Recall Ability *first 3 days only Memory/Recall Ability *first 3 days only: Current season;Location of own  room;Staff names and faces;That he or she is in a hospital/hospital unit    Refer to Care Plan for [place] 1 OT Short Term Goal 1 (Week 1): Pt iwll don shirt sitting unsupported wiht S for components and sitting balance OT Short Term Goal 2 (Week 1): Pt will maintain dynamic sitting blaance with S and min cuing for midline orientation OT Short Term Goal 3 (Week 1): Pt will transfer to toilet with CGA and no more than MIN VC for RW management/proximity OT Short Term Goal 4 (Week 1): Pt will thread BLE into pants  Recommendations for other services: Therapeutic Recreation  Stress management and Outing/community reintegration   Skilled Therapeutic Intervention 1:1. Pt received in bed agreeable to OT with edu re OT role/purpose, CIR, ELOS, POC and safety awareness. No pain reported during session. Pt completes shower level BADL as written below. Pt completes all transfers with MIN A overall with RW demoing poor awareness of proximity of RW. Pt with L lean and poor spatial awareness of midline orientation/awareness of proximity to grab bar in shower. Pt returned to bed at end of session. Exit alarm on, call light in reach and all eneds met ADL ADL Eating: Set up Where Assessed-Eating: Bed level Grooming: Minimal assistance Where Assessed-Grooming: Standing at sink Upper Body Bathing: Minimal assistance (sitting balance) Where Assessed-Upper Body Bathing: Shower Lower Body Bathing: Minimal assistance Where Assessed-Lower Body Bathing: Shower Upper Body Dressing: Minimal assistance Where Assessed-Upper Body Dressing: Chair Lower Body Dressing: Moderate assistance Where Assessed-Lower Body Dressing: Chair Toileting: Minimal assistance Where Assessed-Toileting: Glass blower/designer: Psychiatric nurse Method: Counselling psychologist: Energy manager: Minimal assistance;Moderate cueing Social research officer, government  Method: Heritage manager: Manufacturing systems engineer  Bed Mobility Bed Mobility: Supine to Sit Supine to Sit: Minimal Assistance - Patient > 75% Transfers Sit to Stand: Minimal Assistance - Patient > 75% Stand to Sit: Minimal Assistance - Patient > 75%   Discharge Criteria: Patient will be discharged from OT if patient refuses treatment 3 consecutive times without medical  reason, if treatment goals not met, if there is a change in medical status, if patient makes no progress towards goals or if patient is discharged from hospital.  The above assessment, treatment plan, treatment alternatives and goals were discussed and mutually agreed upon: by patient  Tonny Branch 04/07/2021, 9:26 AM

## 2021-04-07 NOTE — Progress Notes (Signed)
Occupational Therapy Session Note  Patient Details  Name: Shannon Horton. MRN: [id number] Date of Birth: 03-Jul-1946  Today's Date: 04/07/2021 OT Individual Time: 1430-1505 OT Individual Time Calculation (min): 35 min    Short Term Goals: Week 1:  OT Short Term Goal 1 (Week 1): Pt iwll don shirt sitting unsupported wiht S for components and sitting balance OT Short Term Goal 2 (Week 1): Pt will maintain dynamic sitting blaance with S and min cuing for midline orientation OT Short Term Goal 3 (Week 1): Pt will transfer to toilet with CGA and no more than MIN VC for RW management/proximity OT Short Term Goal 4 (Week 1): Pt will thread BLE into pants  Skilled Therapeutic Interventions/Progress Updates:    Pt received sleeping in recliner, easily awoken and reporting no pain. He was agreeable to OT session, requesting to use the bathroom first. He required min cueing for initiation. Min A for sit > stand from the recliner and for ambulatory transfer into the bathroom. Allowed pt to problem solve through opening bathroom door with RW, requiring mod cueing for sequencing and safety. Min A for toileting tasks. Extra time required for BM void. Pt able to complete all hygiene with extremely forward flexed posture but no LOB or balance perturbations. Pt ambulated to sink with CGA using the RW for hand hygiene. Pt returned to supine in bed and was left with all needs met, bed alarm set.   Therapy Documentation Precautions:  Precautions Precautions: Fall Precaution Comments: R crani Restrictions Weight Bearing Restrictions: No   Therapy/Group: Individual Therapy  Curtis Sites 04/07/2021, 2:41 PM

## 2021-04-07 NOTE — Discharge Instructions (Signed)
Inpatient Rehab Discharge Instructions  Keandre Linden. Discharge date and time: No discharge date for patient encounter.   Activities/Precautions/ Functional Status: Activity: activity as tolerated Diet: regular diet Wound Care: Routine skin checks Functional status:  ___ No restrictions     ___ Walk up steps independently ___ 24/7 supervision/assistance   ___ Walk up steps with assistance ___ Intermittent supervision/assistance  ___ Bathe/dress independently ___ Walk with walker     _x__ Bathe/dress with assistance ___ Walk Independently    ___ Shower independently ___ Walk with assistance    ___ Shower with assistance ___ No alcohol     ___ Return to work/school ________  Special Instructions: No driving smoking or alcohol   My questions have been answered and I understand these instructions. I will adhere to these goals and the provided educational materials after my discharge from the hospital.  Patient/Caregiver Signature _______________________________ Date __________  Clinician Signature _______________________________________ Date __________  Please bring this form and your medication list with you to all your follow-up doctor's appointments.

## 2021-04-07 NOTE — Progress Notes (Signed)
PROGRESS NOTE   Subjective/Complaints: Pt states he slept fairly well. Mild headache.   ROS: Patient denies fever, rash, sore throat, blurred vision, nausea, vomiting, diarrhea, cough, shortness of breath or chest pain, joint or back pain, headache, or mood change.    Objective:   No results found. Recent Labs    04/06/21 1753 04/07/21 0544  WBC 7.6 7.2  HGB 15.6 14.0  HCT 46.5 40.5  PLT 263 219   Recent Labs    04/06/21 1753 04/07/21 0544  NA  --  131*  K  --  4.1  CL  --  95*  CO2  --  28  GLUCOSE  --  117*  BUN  --  17  CREATININE 0.72 0.74  CALCIUM  --  8.8*    Intake/Output Summary (Last 24 hours) at 04/07/2021 0847 Last data filed at 04/07/2021 0100 Gross per 24 hour  Intake 480 ml  Output 1000 ml  Net -520 ml        Physical Exam: Vital Signs Blood pressure 109/61, pulse (!) 50, temperature 98.5 F (36.9 C), resp. rate 16, SpO2 98 %.  General: Alert and oriented x 3, No apparent distress HEENT: Head is normocephalic,  PERRLA, EOMI, sclera anicteric, oral mucosa pink and moist, dentition intact, ext ear canals clear,  Neck: Supple without JVD or lymphadenopathy Heart: Reg rate and rhythm. No murmurs rubs or gallops Chest: CTA bilaterally without wheezes, rales, or rhonchi; no distress Abdomen: Soft, non-tender, non-distended, bowel sounds positive. Extremities: No clubbing, cyanosis, or edema. Pulses are 2+ Psych: Pt's affect is appropriate. Pt is cooperative Skin: scalp incision with staples, some associated scab Neuro: oriented to place, person, month, year. Follows basic commands. Reasonable insight and awareness. Normal language. Vision hazy OD. Mild left hemiparesis but easily 4-/5 LUE and LLE. No focal sensory deficits. Mild left inattention Musculoskeletal: Full ROM, No pain with AROM or PROM in the neck, trunk, or extremities. Posture appropriate     Assessment/Plan: 1. Functional  deficits which require 3+ hours per day of interdisciplinary therapy in a comprehensive inpatient rehab setting.  Physiatrist is providing close team supervision and 24 hour management of active medical problems listed below.  Physiatrist and rehab team continue to assess barriers to discharge/monitor patient progress toward functional and medical goals  Care Tool:  Bathing              Bathing assist       Upper Body Dressing/Undressing Upper body dressing        Upper body assist Assist Level: Set up assist    Lower Body Dressing/Undressing Lower body dressing            Lower body assist       Toileting Toileting    Toileting assist Assist for toileting: Minimal Assistance - Patient > 75%     Transfers Chair/bed transfer  Transfers assist           Locomotion Ambulation   Ambulation assist              Walk 10 feet activity   Assist           Walk 50 feet  activity   Assist           Walk 150 feet activity   Assist           Walk 10 feet on uneven surface  activity   Assist           Wheelchair     Assist               Wheelchair 50 feet with 2 turns activity    Assist            Wheelchair 150 feet activity     Assist          Blood pressure 109/61, pulse (!) 50, temperature 98.5 F (36.9 C), resp. rate 16, SpO2 98 %.  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 Follow-up oncology services. Pathology positive for malignant melanoma -patient may shower -ELOS/Goals: 13-20 days, supervision for PT, OT, SLP  -Patient is beginning CIR therapies today including PT and OT SLP 2. Antithrombotics: -DVT/anticoagulation:Subcutaneous heparin. -antiplatelet therapy: N/A 3. Pain  Management:Hydrocodone as needed 4. Mood:Xanax 0.25 mg 3 times daily as needed -antipsychotic agents: N/A 5. Neuropsych: This patientiscapable of making decisions on hisown behalf. 6. Skin/Wound Care:Routine skin checks -keep scalp clean and dry--stable in appearance today 7. Fluids/Electrolytes/Nutrition:encourage PO  I personally reviewed the patient's labs today.    -mild hyponatremia 131---recheck Friday (132 on 6/2)  -protein supplement for low albumin 8. Hypertension. Norvasc 5 mg daily, Avapro 300 mg daily, HCTZ 25 mg daily. Monitor with increased mobility -bp controlled 6/8 9. Seizure prophylaxis. Keppra 500 mg twice daily 10. Glaucoma. Followed by Dr. Satira Sark. Continue eyedrops. 11. History of asthma. Continue inhalers as directed. Check oxygen saturations every shift 12. Constipation.  -still no bm since admit.  -miralax bid -senna-s at bedtime  -sorbitol today    LOS: 1 days A FACE TO FACE EVALUATION WAS PERFORMED  [name] 04/07/2021, 8:47 AM

## 2021-04-07 NOTE — Evaluation (Signed)
Physical Therapy Assessment and Plan  Patient Details  Name: Shannon Horton. MRN: [id number] Date of Birth: Oct 13, 1946  PT Diagnosis: Abnormal posture, Abnormality of gait, Coordination disorder, Difficulty walking, Dizziness and giddiness, Hemiplegia non-dominant, Muscle weakness and Pain in sacrum Rehab Potential: Good ELOS: 10-14 days   Today's Date: 04/07/2021 PT Individual Time: 1100-1200 PT Individual Time Calculation (min): 60 min    Hospital Problem: Principal Problem:   Brain tumor [facility])   Past Medical History:  Past Medical History:  Diagnosis Date  . Allergic rhinitis    gets shots per Dr. Velora Heckler  . Allergy    sees Dr. Harold Hedge   . Asthma   . Glaucoma    sees Dr. Satira Sark   . Hypertension   . Malignant melanoma ([place])    right lower leg, diagnosed on1/21/11   Past Surgical History:  Past Surgical History:  Procedure Laterality Date  . APPLICATION OF CRANIAL NAVIGATION N/A 03/31/2021   Procedure: APPLICATION OF CRANIAL NAVIGATION;  Surgeon: Dawley, Theodoro Doing, DO;  Location: [place];  Service: Neurosurgery;  Laterality: N/A;  . CATARACT EXTRACTION     right ey, Dr. Satira Sark   . COLONOSCOPY  12/15/2015   per Dr.Kaplan, clear, no repeats   . CRANIOTOMY N/A 03/31/2021   Procedure: RIGHT FRONTAL CRANIOTOMY FOR TUMOR;  Surgeon: Dawley, Theodoro Doing, DO;  Location: [place];  Service: Neurosurgery;  Laterality: N/A;  . ESOPHAGOGASTRODUODENOSCOPY     WITH ESOPHAGEAL DILATION 2006 PER dR. kAPLAN  . REMOVAL  of melanoma  12/08/09   from right calf; per Dr. Monica Becton  . removal of melanoma from left abdomen  01/21/10    per Dr. Meredith Leeds  . SQUAMOUS CELL CARCINOMA EXCISION  2013   per Dr. Terrence Dupont, from top of scalp     Assessment & Plan Clinical Impression: Patient is a 75 y.o. year old right-handed male history of glaucoma followed by Dr. Satira Sark, hypertension, tobacco use, remote  melanoma x2 removed from his right calf and left flank 2011 with negative nodal biopsy.  Per chart  review patient lives with spouse.  Two-level home bed and bath main level with one-step to entry.  Independent with assistive device and reported multiple falls over the past 6 weeks.  Prior to 6 weeks ago patient independent and active.  Presented 03/25/2021 with persistent headaches balance deficits x6 weeks, left hemiparesis as well as shuffling gait.  MRI of the brain showed a 3.8 cm enhancing and hemorrhagic mass in the right basal ganglia/frontal lobe region with a 5 mm left midline shift.  CT of the chest and abdomen was notable for left upper pulmonary nodule, left hilar adenopathy right upper lobe pulmonary nodule left lower quadrant mass arising from the right adrenal gland, hepatic lesions.  CT-guided biopsy of the adrenal mass 03/30/2021 positive for malignancy.  Patient underwent right craniotomy resection of tumor 03/31/2021 per Dr. Reatha Armour.  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  Brain surgical tissue pathology remains pending with follow-up per oncology services Dr. Lindi Adie as well as Dr. Alen Blew..  Decadron protocol as indicated.  Maintained on Keppra for seizure prophylaxis.  Patient was cleared to begin subcutaneous heparin for DVT prophylaxis 04/02/2021.  Tolerating a regular consistency diet.  Therapy evaluations completed due to patient decreased functional mobility/left hemiparesis was admitted for a comprehensive rehab program. Patient transferred to CIR on 04/06/2021 .   Patient currently requires min with mobility secondary to muscle weakness and muscle joint tightness, decreased cardiorespiratoy endurance, decreased  coordination and decreased motor planning, decreased attention, decreased awareness and decreased safety awareness and decreased sitting balance, decreased standing balance, decreased postural control, hemiplegia and decreased balance strategies.  Prior to hospitalization, patient was modified independent  with mobility and lived with Spouse in a House home.   Home access is 1Stairs to enter.  Patient will benefit from skilled PT intervention to maximize safe functional mobility, minimize fall risk and decrease caregiver burden for planned discharge home with intermittent assist.  Anticipate patient will benefit from follow up OP at discharge.  PT - End of Session Activity Tolerance: Tolerates 30+ min activity with multiple rests Endurance Deficit: Yes PT Assessment Rehab Potential (ACUTE/IP ONLY): Good PT Barriers to Discharge: Lack of/limited family support;Behavior PT Patient demonstrates impairments in the following area(s): Balance;Pain;Behavior;Perception;Edema;Safety;Sensory;Endurance;Motor;Skin Integrity;Nutrition PT Transfers Functional Problem(s): Bed Mobility;Bed to Chair;Car;Furniture PT Locomotion Functional Problem(s): Ambulation;Wheelchair Mobility;Stairs PT Plan PT Intensity: Minimum of 1-2 x/day ,45 to 90 minutes PT Frequency: 5 out of 7 days PT Duration Estimated Length of Stay: 10-14 days PT Treatment/Interventions: Ambulation/gait training;Cognitive remediation/compensation;DME/adaptive equipment instruction;Discharge planning;Functional mobility training;Pain management;Psychosocial support;Splinting/orthotics;Therapeutic Activities;UE/LE Strength taining/ROM;Visual/perceptual remediation/compensation;Wheelchair propulsion/positioning;UE/LE Coordination activities;Therapeutic Exercise;Stair training;Skin care/wound management;Patient/family education;Neuromuscular re-education;Functional electrical stimulation;Disease management/prevention;Community reintegration;Balance/vestibular training PT Transfers Anticipated Outcome(s): mod I with LRAD PT Locomotion Anticipated Outcome(s): Supervision using LRAD >500 ft PT Recommendation Recommendations for Other Services: Neuropsych consult;Therapeutic Recreation consult Therapeutic Recreation Interventions: Stress management Follow Up Recommendations: Outpatient PT Patient destination:  Home   PT Evaluation Precautions/Restrictions Precautions Precautions: Fall Precaution Comments: R crani Restrictions Weight Bearing Restrictions: No Home Living/Prior Functioning Home Living Available Help at Discharge: Family;Available 24 hours/day Type of Home: House Home Access: Stairs to enter [employer] of Steps: 1 Entrance Stairs-Rails: None Home Layout: One level;Other (Comment) Bathroom Shower/Tub: Walk-in shower;Tub/shower unit (walk in shower has door, tub has curtain) Bathroom Toilet: Standard Bathroom Accessibility: Yes  Lives With: Spouse Prior Function Level of Independence: Independent with basic ADLs;Independent with homemaking with ambulation Comments: pt reports using cane progressing to RW for ambulation intermittently PTA, recent history of multiple falls over the past 6 weeks. Prior to 6 weeks ago, pt biking 20+ miles on bike. Vision/Perception  Vision - Assessment Eye Alignment: Within Functional Limits Ocular Range of Motion: Within Functional Limits Alignment/Gaze Preference: Within Defined Limits Tracking/Visual Pursuits: Able to track stimulus in all quads without difficulty Saccades: Within functional limits Perception Perception: Impaired Inattention/Neglect: Impaired-to be further tested in functional context Praxis Praxis: Impaired Praxis-Other Comments: motor sensory involvement of L side  Cognition Overall Cognitive Status: Impaired/Different from baseline Orientation Level: Oriented X4 Memory: Appears intact Awareness: Impaired Awareness Impairment: Emergent impairment Safety/Judgment: Impaired Sensation Sensation Light Touch: Appears Intact Proprioception: Impaired Detail Proprioception Impaired Details: Impaired LLE Coordination Gross Motor Movements are Fluid and Coordinated: No Fine Motor Movements are Fluid and Coordinated: No Coordination and Movement Description: decreased coordination of L lower extremity with  functional activities Heel Shin Test: slow and deliberate L>R Motor  Motor Motor: Hemiplegia Motor - Skilled Clinical Observations: mild L hemi   Trunk/Postural Assessment  Cervical Assessment Cervical Assessment: Exceptions to WFL (hea d forward) Thoracic Assessment Thoracic Assessment: Exceptions to [facility] (rounded shoudlers) Lumbar Assessment Lumbar Assessment: Exceptions to [facility] (posterior pelvic tilt) Postural Control Postural Control: Deficits on evaluation (delayed L >R)  Balance Balance Balance Assessed: Yes Dynamic Sitting Balance Dynamic Sitting - Balance Support: Feet supported;During functional activity Dynamic Sitting - Level of Assistance: 4: Min assist Dynamic Sitting Balance - Compensations: L lean in sitting Dynamic Standing Balance Dynamic Standing -  Balance Support: During functional activity;Bilateral upper extremity supported Dynamic Standing - Level of Assistance: 4: Min assist Extremity Assessment      RLE Assessment RLE Assessment: Exceptions to [facility] Active Range of Motion (AROM) Comments: tigh hamstrings and heel cords General Strength Comments: Grossly 4+/5 throughout in sitting LLE Assessment LLE Assessment: Exceptions to [facility] Active Range of Motion (AROM) Comments: tight hamstrings and heel cords General Strength Comments: Grossly 4 to 4+/5 throughout in sitting  Care Tool Care Tool Bed Mobility Roll left and right activity   Roll left and right assist level: Independent    Sit to lying activity   Sit to lying assist level: Independent    Lying to sitting edge of bed activity   Lying to sitting edge of bed assist level: Independent     Care Tool Transfers Sit to stand transfer   Sit to stand assist level: Minimal Assistance - Patient > 75%    Chair/bed transfer   Chair/bed transfer assist level: Minimal Assistance - Patient > 75%     Toilet transfer   Assist Level: Minimal Assistance - Patient > 75%    Car transfer   Car transfer  assist level: Minimal Assistance - Patient > 75%      Care Tool Locomotion Ambulation   Assist level: Minimal Assistance - Patient > 75% Assistive device: Walker-rolling Max distance: 312 ft  Walk 10 feet activity   Assist level: Minimal Assistance - Patient > 75% Assistive device: Walker-rolling   Walk 50 feet with 2 turns activity   Assist level: Minimal Assistance - Patient > 75% Assistive device: Walker-rolling  Walk 150 feet activity   Assist level: Minimal Assistance - Patient > 75% Assistive device: Walker-rolling  Walk 10 feet on uneven surfaces activity Walk 10 feet on uneven surfaces activity did not occur: Safety/medical concerns (decreased safety/spatial awareness)      Stairs   Assist level: Minimal Assistance - Patient > 75% Stairs assistive device: 2 hand rails Max number of stairs: 8  Walk up/down 1 step activity   Walk up/down 1 step (curb) assist level: Minimal Assistance - Patient > 75% Walk up/down 1 step or curb assistive device: Walker    Walk up/down 4 steps activity Walk up/down 4 steps assist level: Minimal Assistance - Patient > 75% Walk up/down 4 steps assistive device: 2 hand rails  Walk up/down 12 steps activity Walk up/down 12 steps activity did not occur: Safety/medical concerns (decreased activity tolerance)      Pick up small objects from floor Pick up small object from the floor (from standing position) activity did not occur: Safety/medical concerns (decreased balance)      Wheelchair       Wheelchair assist level: Supervision/Verbal cueing Max wheelchair distance: 204 ft  Wheel 50 feet with 2 turns activity   Assist Level: Supervision/Verbal cueing  Wheel 150 feet activity   Assist Level: Supervision/Verbal cueing    Refer to Care Plan for Long Term Goals  SHORT TERM GOAL WEEK 1 PT Short Term Goal 1 (Week 1): Patient will perform basic transfers with supervison consistently. PT Short Term Goal 2 (Week 1): Patient will ambulate  >400 ft with CGA using LRAD. PT Short Term Goal 3 (Week 1): Patient will improve by MCID on the Berg Balance Test  Recommendations for other services: Neuropsych and Therapeutic Recreation  Stress management  Skilled Therapeutic Intervention Evaluation completed (see details above and below) with education on PT POC and goals and individual treatment initiated with focus  on functional mobility/transfers, LE strength, dynamic standing balance/coordination, ambulation, stair navigation, simulated car transfers, and improved endurance with activity Patient provided with 16"x16" wheelchair with Roho cushion due to sacral soreness and adjustments made to promote optimal seating posture and pressure distribution. Patient also provided with RW for use in room and therapist adjusted to proper height for patient.  Patient in bed with his wife in the room upon PT arrival. Patient alert and agreeable to PT session. Patient denied pain during session.  Vitals:  Sitting EOB at beginning of session: BP 85/66, HR 59 (light headed) Sitting after toilet transfer to BSC: BP 101/66, HR 61 Sitting after ambulation: BP 111/65, HR 97  Therapeutic Activity: Bed Mobility: Patient performed rolling R/L and supine to/from sit independently with increased time in a flat bed without use of bed rails to simulate home set-up. Educated patient on elevating HOB to 30 deg due to recent brain surgery. Transfers: Patient performed sit to/from stand initially with min A without AD x2 and progressed to CGA with and without a RW x3. Provided verbal cues for hand placement on RW, reaching back to sit, and forward weight shift. Patient performed stand pivot transfers bed<>BSC, bed>w/c, and w/c>recliner progressing from min A to CGA without AD. Provided cues for completing the turn prior to sitting for safety. Patient performed a simulated elevated sedan height car transfer to simulate personal vehicle with min A without AD.  Gait  Training:  6 Min Walk Test:  Instructed patient to ambulate as quickly and as safely as possible for 6 minutes using LRAD. Patient was allowed to take standing rest breaks without stopping the test, but if the patient required a sitting rest break the clock would be stopped and the test would be over.  Results: 312 feet stopping at 4:53 due to fatigue with min A-CGA using a RW (95.1 meters, Avg speed 0.20ms)   Results indicate that the patient has reduced endurance with ambulation compared to age matched norms (male 784-79yrs=527 meters).  Patient ascendeddescended 8-6" steps using B rails with min A. Performed reciprocal gait pattern while ascending and step-to gait pattern leading with L while descending.  Patient ascended/descended 1-8" step using RW with min A. Performed step-to gait pattern leading with R while ascending and L while descending. Provided cues for technique and sequencing.   Wheelchair Mobility:  Patient propelled wheelchair 204 feet with supervision using B upper extremities. Tends to veer L, able to self correct without cues.  Instructed pt in results of PT evaluation as detailed above, PT POC, rehab potential, rehab goals, and discharge recommendations. Additionally discussed CIR's policies regarding fall safety and use of chair alarm and/or quick release belt. Pt verbalized understanding and in agreement. Will update pt's family members as they become available.   Patient in recliner on Roho cushion with pillows to elevate his legs at end of session with breaks locked, chair alarm set, and all needs within reach.   Discharge Criteria: Patient will be discharged from PT if patient refuses treatment 3 consecutive times without medical reason, if treatment goals not met, if there is a change in medical status, if patient makes no progress towards goals or if patient is discharged from hospital.  The above assessment, treatment plan, treatment alternatives and goals were  discussed and mutually agreed upon: by patient  CDoreene BurkePT, DPT  04/07/2021, 12:36 PM

## 2021-04-08 NOTE — Progress Notes (Signed)
Inpatient Rehabilitation Care Coordinator Assessment and Plan Patient Details  Name: Shannon Horton. MRN: [id number] Date of Birth: 1946/06/11  Today's Date: 04/08/2021  Hospital Problems: Principal Problem:   Brain tumor [facility])  Past Medical History:  Past Medical History:  Diagnosis Date   Allergic rhinitis    gets shots per Dr. Velora Heckler   Allergy    sees Dr. Harold Hedge    Asthma    Glaucoma    sees Dr. Satira Sark    Hypertension    Malignant melanoma [facility])    right lower leg, diagnosed on1/21/11   Past Surgical History:  Past Surgical History:  Procedure Laterality Date   APPLICATION OF CRANIAL NAVIGATION N/A 03/31/2021   Procedure: APPLICATION OF CRANIAL NAVIGATION;  Surgeon: Karsten Ro, DO;  Location: [place];  Service: Neurosurgery;  Laterality: N/A;   CATARACT EXTRACTION     right ey, Dr. Satira Sark    COLONOSCOPY  12/15/2015   per Dr.Kaplan, clear, no repeats    CRANIOTOMY N/A 03/31/2021   Procedure: RIGHT FRONTAL CRANIOTOMY FOR TUMOR;  Surgeon: Karsten Ro, DO;  Location: [place];  Service: Neurosurgery;  Laterality: N/A;   ESOPHAGOGASTRODUODENOSCOPY     WITH ESOPHAGEAL DILATION 2006 PER dR. kAPLAN   REMOVAL  of melanoma  12/08/09   from right calf; per Dr. Monica Becton   removal of melanoma from left abdomen  01/21/10    per Dr. Denice Bors CELL CARCINOMA EXCISION  2013   per Dr. Terrence Dupont, from top of scalp    Social History:  reports that he has quit smoking. He has never used smokeless tobacco. He reports current alcohol use of about 5.0 - 10.0 standard drinks of alcohol per week. He reports that he does not use drugs.  Family / Support Systems Marital Status: Married How Long?: 87 years Patient Roles: Spouse, Parent Spouse/Significant Other: [name] (wife): [phone] Children: 3 sons: Kalim (youngets/lives in [place]), Glass blower/designer ([place]), and Quita Skye (oldest) Other Supports: None reported Anticipated Caregiver: Wife Ability/Limitations of Caregiver: Wife  will provide 24/7 care. Sons will provide asssistance PRN. 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 Psychiatric History: Denies Substance Abuse History: Pt admits he quit smoking cigarettes 30 years ago. Admits to drinking 1 beer daily. Denies rec use.  Patient / Family Perceptions, Expectations & Goals Pt/Family understanding of illness & functional limitations: Pt and family have a general understanding of care needs Premorbid pt/family roles/activities: Independent Anticipated changes in roles/activities/participation: Assistance with ADLs/IADLs Pt/family expectations/goals: Pt goal is to "get my self physically enough to be independent."  [employer]: None Premorbid Home Care/DME Agencies: None Transportation available at discharge: Family Resource referrals recommended: Neuropsychology  Discharge Planning Living Arrangements: Spouse/significant other Support Systems: Spouse/significant other, Children Type of Residence: Private residence Administrator, sports: Multimedia programmer (specify) ([place]) Museum/gallery curator Resources: Fish farm manager, Family Support Financial Screen Referred: No Living Expenses:  Mortgage Money Management: Spouse Does  the patient have any problems obtaining your medications?: No Home Management: Wife managed all homecare needs Patient/Family Preliminary Plans: no changes Care Coordinator Barriers to Discharge: Decreased caregiver support, Lack of/limited family support Care Coordinator Anticipated Follow Up Needs: HH/OP  Clinical Impression SW met with pt, pt wife, and pt son in room to introduce self, explain role, and discuss discharge process. HCPOA is his wife. DME: cane (patient states lost from leaving [facility] to ICU before his surgery. Wife states when she went back to [facility] she was told it was sent to ICU when they saw it was left. No cane brought to ICU), and  RW. SW informed will share with appropriate staff about trying to find cane. Wife shared a lot of concerns about bathroom setup. SW encouraged her to take pictures to share with OT so she can get suggestions on the best way to set up home. SW also advised there will be family education (explanation given) and there will be time to address her concerns.   Rhetta Cleek A Kenya Kook 04/08/2021, 5:04 PM

## 2021-04-08 NOTE — Progress Notes (Signed)
PROGRESS NOTE   Subjective/Complaints: No new complaints. Moved bowels ysterday. Slept last night. Mild right-sided headache  ROS: Patient denies fever, rash, sore throat, blurred vision, nausea, vomiting, diarrhea, cough, shortness of breath or chest pain, joint or back pain,   or mood change.   Objective:   No results found. Recent Labs    04/06/21 1753 04/07/21 0544  WBC 7.6 7.2  HGB 15.6 14.0  HCT 46.5 40.5  PLT 263 219   Recent Labs    04/06/21 1753 04/07/21 0544  NA  --  131*  K  --  4.1  CL  --  95*  CO2  --  28  GLUCOSE  --  117*  BUN  --  17  CREATININE 0.72 0.74  CALCIUM  --  8.8*    Intake/Output Summary (Last 24 hours) at 04/08/2021 1055 Last data filed at 04/08/2021 0130 Gross per 24 hour  Intake 360 ml  Output 845 ml  Net -485 ml        Physical Exam: Vital Signs Blood pressure (!) 94/54, pulse (!) 53, temperature 97.7 F (36.5 C), temperature source Oral, resp. rate 16, SpO2 97 %.  Constitutional: No distress . Vital signs reviewed. HEENT: EOMI, oral membranes moist Neck: supple Cardiovascular: RRR without murmur. No JVD    Respiratory/Chest: CTA Bilaterally without wheezes or rales. Normal effort    GI/Abdomen: BS +, non-tender, non-distended Ext: no clubbing, cyanosis, or edema Psych: pleasant and cooperative Skin: scalp incision with staples, some associated scab in hair, loose Neuro: oriented to place, person, month, year. Follows basic commands. Reasonable insight and awareness. Normal language. Vision hazy OD. Mild left hemiparesis but easily 4-/5 LUE and LLE. No focal sensory deficits. Mild left inattention Musculoskeletal: Full ROM, No pain with AROM or PROM in the neck, trunk, or extremities. Posture appropriate      Assessment/Plan: 1. Functional deficits which require 3+ hours per day of interdisciplinary therapy in a comprehensive inpatient rehab setting. Physiatrist is  providing close team supervision and 24 hour management of active medical problems listed below. Physiatrist and rehab team continue to assess barriers to discharge/monitor patient progress toward functional and medical goals  Care Tool:  Bathing    Body parts bathed by patient: Right arm, Left arm, Chest, Abdomen, Front perineal area, Right upper leg, Left upper leg, Right lower leg, Left lower leg, Face   Body parts bathed by helper: Buttocks     Bathing assist Assist Level: Minimal Assistance - Patient > 75%     Upper Body Dressing/Undressing Upper body dressing   What is the patient wearing?: Pull over shirt    Upper body assist Assist Level: Minimal Assistance - Patient > 75%    Lower Body Dressing/Undressing Lower body dressing      What is the patient wearing?: Pants     Lower body assist Assist for lower body dressing: Moderate Assistance - Patient 50 - 74%     Toileting Toileting    Toileting assist Assist for toileting: Independent with assistive device Assistive Device Comment: urinal   Transfers Chair/bed transfer  Transfers assist     Chair/bed transfer assist level: Minimal Assistance - Patient >  75%     Locomotion Ambulation   Ambulation assist      Assist level: Minimal Assistance - Patient > 75% Assistive device: Walker-rolling Max distance: 312 ft   Walk 10 feet activity   Assist     Assist level: Minimal Assistance - Patient > 75% Assistive device: Walker-rolling   Walk 50 feet activity   Assist    Assist level: Minimal Assistance - Patient > 75% Assistive device: Walker-rolling    Walk 150 feet activity   Assist    Assist level: Minimal Assistance - Patient > 75% Assistive device: Walker-rolling    Walk 10 feet on uneven surface  activity   Assist Walk 10 feet on uneven surfaces activity did not occur: Safety/medical concerns (decreased safety/spatial awareness)         Wheelchair     Assist         Wheelchair assist level: Supervision/Verbal cueing Max wheelchair distance: 204 ft    Wheelchair 50 feet with 2 turns activity    Assist        Assist Level: Supervision/Verbal cueing   Wheelchair 150 feet activity     Assist      Assist Level: Supervision/Verbal cueing   Blood pressure (!) 94/54, pulse (!) 53, temperature 97.7 F (36.5 C), temperature source Oral, resp. rate 16, SpO2 97 %.  Medical Problem List and Plan: 1.  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  Follow-up oncology services. Pathology positive for malignant melanoma             -patient may shower             -ELOS/Goals: 13-20 days, supervision for PT, OT, SLP  -Patient is beginning CIR therapies today including PT and OT SLP 2.  Antithrombotics: -DVT/anticoagulation: Subcutaneous heparin.             -antiplatelet therapy: N/A 3. Pain Management: Hydrocodone as needed 4. Mood: Xanax 0.25 mg 3 times daily as needed             -antipsychotic agents: N/A 5. Neuropsych: This patient is capable of making decisions on his own behalf. 6. Skin/Wound Care: Routine skin checks             -keep scalp clean and dry--stable in appearance today 7. Fluids/Electrolytes/Nutrition: encourage PO  I personally reviewed the patient's labs today.    -mild hyponatremia 131---recheck Friday (132 on 6/2)  -protein supplement for low albumin 8.  Hypertension.  Norvasc 5 mg daily, Avapro 300 mg daily, HCTZ 25 mg daily.  Monitor with increased mobility             -bp controlled 6/9---avoid hypotension. May need to back off meds a bit 9.  Seizure prophylaxis.  Keppra 500 mg twice daily 10.  Glaucoma.  Followed by Dr. Satira Sark.  Continue eyedrops. 11.  History of asthma.  Continue inhalers as directed.  Check oxygen saturations every shift 12.  Constipation.               -still no bm since  admit.              -miralax bid             -senna-s at bedtime  -sorbitol 6/8 with results    LOS: 2 days A FACE TO FACE EVALUATION WAS PERFORMED  [name] 04/08/2021, 10:55 AM

## 2021-04-08 NOTE — Progress Notes (Signed)
Physical Therapy Session Note  Patient Details  Name: Shannon Horton. MRN: [id number] Date of Birth: Jun 10, 1946  Today's Date: 04/08/2021 PT Individual Time: 0900-0950 and 3614-4315 PT Individual Time Calculation (min): 50 min and 30 min  Short Term Goals: Week 1:  PT Short Term Goal 1 (Week 1): Patient will perform basic transfers with supervison consistently. PT Short Term Goal 2 (Week 1): Patient will ambulate >400 ft with CGA using LRAD. PT Short Term Goal 3 (Week 1): Patient will improve by MCID on the [facility] Balance Test  Skilled Therapeutic Interventions/Progress Updates:     Session 1: Patient in bed asleep upon PT arrival. Patient aroused to verbal stimulatin and agreeable to PT session. Patient reported 3/10 headache during session, RN made aware. PT provided repositioning, rest breaks, and distraction as pain interventions throughout session.   Vitals:  Sitting at beginning of session: BP 88/56, HR 61 Standing: BP 100/71, HR 84 Lying at end of session: BP 89/62, HR 64  Therapeutic Activity: Bed Mobility: Patient performed supine to/from sit with CGA due to increased L trunk lean when coming to sitting. Provided verbal cues for reaching R elbow to the bed to over compensate for L lean when coming to sitting. Transfers: Patient performed stand pivot bed>w/c and w/c<>toilet with CGA and sit to/from stand x3 with min A without AD. Provided verbal cues for forward weight shift and increased trunk/hip/knee extension in standing. Patient attempt to void in standing during toileting, however, agreeable to trial in sitting due to light headedness in standing. Patient was continent of bowl and bladder, performed peri-care independently and lower body clothing management with min A. He performed hand hygiene at the sink while seated in the w/c with set-up assist.   Gait Training:  Patient ambulated 14 feet with out an AD with min A. Ambulated with decreased gait speed, decreased step  length and height, increased B hip and knee flexion in stance, forward trunk lean, and downward head gaze. Provided verbal cues for erect posture, increased foot clearance, and completing a turn before initiating to sit down.  Neuromuscular Re-ed: Patient performed the Berg Balance Test: Patient demonstrates increased fall risk as noted by score of 7/56 on Berg Balance Scale.  (<36= high risk for falls, close to 100%; 37-45 significant >80%; 46-51 moderate >50%; 52-55 lower >25%). Reviewed results and interpretation with patient and educated on goals for PT interventions to improve patient's score prior to d/c and reduce his risk for falls. Patient stated understanding.  Patient in bed due to fatigue and feeling "light headed" at end of session with breaks locked, bed alarm set, and all needs within reach. Patient missed 10 min of skilled PT due to fatigue and low BP, see above, RN made aware. Will attempt to make-up missed time as able.    Session 2: Patient in bed upon PT arrival. Patient alert and agreeable to PT session. Patient denied pain during session. Patient's wife arrived at beginning of session.   Vitals:  In sitting at beginning of session: BP 98/68, HR 53 After first ambulation trial: BP 113/66, HR 54  Patient expressed frustration with performance during speech therapy session. Educated on BI deficits and recovery and provided patient with coping strategies and encouraged night time sleep to improve frustration tolerance and performance, as patient continues to report sleeping poorly last night, RN made aware.  Therapeutic Activity: Bed Mobility: Patient performed supine to/from sit with supervision in a flat bed without use of bed rails with  improved L lean in sitting this session. Provided verbal cues for overcorrection to the R when coming to sitting to reduce L lean and improve sitting balance EOB. Transfers: Patient performed sit to/from stand x3 with min A-CGA using RW.  Provided verbal cues for forward and R weight shift for improved balance and midline orientation in standing.  Gait Training:  Patient ambulated 50 feet x1 using RW with min A progressing to mod A due to reduced motor planning with onset of "light headedness" on first trial, see vitals above. Patient then ambulated ~20 feet x2 using RW with CGA-min A without symptoms. Ambulated with decreased gait speed, decreased step length and height, increased B hip and knee flexion in stance, forward trunk lean, and downward head gaze. Provided verbal cues for erect posture, proximity to RW, looking ahead, and increased step height for safety.  Patient in bed due to fatigue, despite encouragement for patient to sit OOB for improved upright tolerance, at end of session with breaks locked, bed alarm set, and all needs within reach. Patient's wife at bedside.  Therapy Documentation Precautions:  Precautions Precautions: Fall Precaution Comments: R crani Restrictions Weight Bearing Restrictions: No General: PT Amount of Missed Time (min): 10 Minutes PT Missed Treatment Reason: Patient fatigue (low BP) Balance: Standardized Balance Assessment Standardized Balance Assessment: Berg Balance Test Berg Balance Test Sit to Stand: Needs minimal aid to stand or to stabilize Standing Unsupported: Able to stand 30 seconds unsupported Sitting with Back Unsupported but Feet Supported on Floor or Stool: Able to sit 30 seconds Stand to Sit: Sits independently, has uncontrolled descent Transfers: Needs one person to assist Standing Unsupported with Eyes Closed: Needs help to keep from falling Standing Ubsupported with Feet Together: Needs help to attain position and unable to hold for 15 seconds From Standing, Reach Forward with Outstretched Arm: Loses balance while trying/requires external support From Standing Position, Pick up Object from Floor: Unable to try/needs assist to keep balance From Standing Position,  Turn to Look Behind Over each Shoulder: Needs assist to keep from losing balance and falling Turn 360 Degrees: Needs assistance while turning Standing Unsupported, Alternately Place Feet on Step/Stool: Needs assistance to keep from falling or unable to try Standing Unsupported, One Foot in Front: Loses balance while stepping or standing Standing on One Leg: Unable to try or needs assist to prevent fall Total Score: 7    Therapy/Group: Individual Therapy  Kathi Dohn L Cora Brierley PT, DPT   04/08/2021, 12:39 PM

## 2021-04-08 NOTE — Progress Notes (Signed)
Speech Language Pathology Daily Session Note  Patient Details  Name: Shannon Horton. MRN: [id number] Date of Birth: Jul 02, 1946  Today's Date: 04/08/2021 SLP Individual Time: 1300-1355 SLP Individual Time Calculation (min): 55 min  Short Term Goals: Week 1: SLP Short Term Goal 1 (Week 1): Patient will recall new, functional daily information with Min verbal and visual cues SLP Short Term Goal 2 (Week 1): Pt will increase sustained attention for functional tasks to 15 minutes provided min A cues SLP Short Term Goal 3 (Week 1): Pt will complete mildly complex problem solving tasks with 75% accuracy and Min A cues  Skilled Therapeutic Interventions: Skilled treatment session focused on cognitive goals. Upon arrival, patient appeared tired and reported fatigue from not sleeping well the night before. SLP facilitated session by providing Max A verbal cues and more than a reasonable amount of time for processing and sustained attention during a basic money management task. Patient required ~20 minutes to count basic change. Extra time and Mod A verbal cues were also needed for problem solving. Patient with decreased awareness regarding difficulty of task. Patient left upright in bed with alarm on and all needs within reach. Continue with current plan of care.      Pain Pain Assessment Pain Score: 0-No pain  Therapy/Group: Individual Therapy  Grantley Savage 04/08/2021, 3:04 PM

## 2021-04-08 NOTE — Care Management (Signed)
[facility] Individual Statement of Services  Patient Name:  Shannon Horton.  Date:  04/08/2021  Welcome to the [facility].  Our goal is to provide you with an individualized program based on your diagnosis and situation, designed to meet your specific needs.  With this comprehensive rehabilitation program, you will be expected to participate in at least 3 hours of rehabilitation therapies Monday-Friday, with modified therapy programming on the weekends.  Your rehabilitation program will include the following services:  Physical Therapy (PT), Occupational Therapy (OT), Speech Therapy (ST), 24 hour per day rehabilitation nursing, Therapeutic Recreaction (TR), Psychology, Neuropsychology, Care Coordinator, Rehabilitation Medicine, [facility], and Other  Weekly team conferences will be held on Tuesdays to discuss your progress.  Your Inpatient Rehabilitation Care Coordinator will talk with you frequently to get your input and to update you on team discussions.  Team conferences with you and your family in attendance may also be held.  Expected length of stay: 10-14 days  Overall anticipated outcome: Supervision  Depending on your progress and recovery, your program may change. Your Inpatient Rehabilitation Care Coordinator will coordinate services and will keep you informed of any changes. Your Inpatient Rehabilitation Care Coordinator's name and contact numbers are listed  below.  The following services may also be recommended but are not provided by the [facility] will be made to provide these services after discharge if needed.  Arrangements include referral to agencies that provide these services.  Your insurance has been verified to be:  [employer]  Your  primary doctor is:  Alysia Penna  Pertinent information will be shared with your doctor and your insurance company.  Inpatient Rehabilitation Care Coordinator:  Cathleen Corti [phone] or ([phone]  Information discussed with and copy given to patient by: Rana Snare, 04/08/2021, 5:10 PM

## 2021-04-08 NOTE — Progress Notes (Signed)
Occupational Therapy Session Note  Patient Details  Name: Shannon Horton. MRN: [id number] Date of Birth: [birth year]  Today's Date: 04/08/2021 OT Individual Time: 1300-1355 OT Individual Time Calculation (min): 55 min    Short Term Goals: Week 1:  OT Short Term Goal 1 (Week 1): Pt iwll don shirt sitting unsupported wiht S for components and sitting balance OT Short Term Goal 2 (Week 1): Pt will maintain dynamic sitting blaance with S and min cuing for midline orientation OT Short Term Goal 3 (Week 1): Pt will transfer to toilet with CGA and no more than MIN VC for RW management/proximity OT Short Term Goal 4 (Week 1): Pt will thread BLE into pants  Skilled Therapeutic Interventions/Progress Updates:     Pt received in  bed with no pain but mild dizziness with sitting. Pt BP assessed with knee highs 91/60 and thigh highs sittin EOB after dressing 107/71  ADL:  Pt completes UB dressing with MINA for sitting balance at EOB Pt completes LB dressing with MOD A  overall for sitting banace during threading BLE, VC for hemi dressing techniques and MIN A to power up to standing no AD  Pt completes footwear with MIN A for sitting balance at EOB.  Therapeutic activity 9HPT : RUE 47 sec LUE 55 sec explained implications and functional applications. Tested seated supported. Would be good to test unsupported to see implications of sitting blaance on [facility]  Therpeutic exercise Pt completes 1x10dowel rod therex for BUE shoulder strengthening required for BADLs/functional transfers as follows with demo cuing and 3 # dowel rod  Shoulder flex/ext, shoulder press, horizonal ab/adduct Circles in B directions Elbow flex/ext, chest press Wrist flex/ext   Pt left at end of session in bed with exit alarm on, call light in reach and all needs met   Therapy Documentation Precautions:  Precautions Precautions: Fall Precaution Comments: R crani Restrictions Weight Bearing Restrictions:  No General:   Vital Signs: Therapy Vitals Temp: 97.7 F (36.5 C) Temp Source: Oral Pulse Rate: (!) 53 Resp: 16 BP: (!) 94/54 Patient Position (if appropriate): Lying Oxygen Therapy SpO2: 97 % O2 Device: Room Air Pain:   ADL: ADL Eating: Set up Where Assessed-Eating: Bed level Grooming: Minimal assistance Where Assessed-Grooming: Standing at sink Upper Body Bathing: Minimal assistance (sitting balance) Where Assessed-Upper Body Bathing: Shower Lower Body Bathing: Minimal assistance Where Assessed-Lower Body Bathing: Shower Upper Body Dressing: Minimal assistance Where Assessed-Upper Body Dressing: Chair Lower Body Dressing: Moderate assistance Where Assessed-Lower Body Dressing: Chair Toileting: Minimal assistance Where Assessed-Toileting: Glass blower/designer: Psychiatric nurse Method: Counselling psychologist: Energy manager: Minimal assistance, Moderate cueing Social research officer, government Method: Heritage manager: Educational psychologist   Exercises:   Other Treatments:     Therapy/Group: Individual Therapy  Tonny Branch 04/08/2021, 6:56 AM

## 2021-04-09 ENCOUNTER — Ambulatory Visit: Payer: Medicare PPO | Admitting: Oncology

## 2021-04-09 ENCOUNTER — Inpatient Hospital Stay (HOSPITAL_COMMUNITY): Payer: Medicare PPO

## 2021-04-09 DIAGNOSIS — R400 Somnolence: Secondary | ICD-10-CM

## 2021-04-09 LAB — CBC
HCT: 42.3 % (ref 39.0–52.0)
Hemoglobin: 14.7 g/dL (ref 13.0–17.0)
MCH: 31.4 pg (ref 26.0–34.0)
MCHC: 34.8 g/dL (ref 30.0–36.0)
MCV: 90.4 fL (ref 80.0–100.0)
Platelets: 233 10*3/uL (ref 150–400)
RBC: 4.68 MIL/uL (ref 4.22–5.81)
RDW: 12.2 % (ref 11.5–15.5)
WBC: 21.9 10*3/uL — ABNORMAL HIGH (ref 4.0–10.5)
nRBC: 0 % (ref 0.0–0.2)

## 2021-04-09 LAB — BASIC METABOLIC PANEL
Anion gap: 11 (ref 5–15)
BUN: 15 mg/dL (ref 8–23)
CO2: 25 mmol/L (ref 22–32)
Calcium: 9 mg/dL (ref 8.9–10.3)
Chloride: 92 mmol/L — ABNORMAL LOW (ref 98–111)
Creatinine, Ser: 0.73 mg/dL (ref 0.61–1.24)
GFR, Estimated: >60 mL/min (ref >60)
Glucose, Bld: 148 mg/dL — ABNORMAL HIGH (ref 70–99)
Potassium: 3.7 mmol/L (ref 3.5–5.1)
Sodium: 128 mmol/L — ABNORMAL LOW (ref 135–145)

## 2021-04-09 LAB — URINALYSIS, COMPLETE (UACMP) WITH MICROSCOPIC
Bacteria, UA: NONE SEEN
Bilirubin Urine: NEGATIVE
Glucose, UA: NEGATIVE mg/dL
Hgb urine dipstick: NEGATIVE
Ketones, ur: 5 mg/dL — AB
Leukocytes,Ua: NEGATIVE
Nitrite: NEGATIVE
Protein, ur: NEGATIVE mg/dL
Specific Gravity, Urine: 1.017 (ref 1.005–1.030)
pH: 6 (ref 5.0–8.0)

## 2021-04-09 MED ORDER — SODIUM CHLORIDE 0.9 % IV SOLN
2.0000 g | Freq: Three times a day (TID) | INTRAVENOUS | Status: AC
  Administered 2021-04-09 – 2021-04-13 (×13): 2 g via INTRAVENOUS
  Filled 2021-04-09 (×16): qty 2

## 2021-04-09 MED ORDER — SODIUM CHLORIDE 0.9 % IV SOLN: INTRAVENOUS | Status: DC

## 2021-04-09 NOTE — Progress Notes (Signed)
Pt appears to be slightly sluggish this morning and only following commands intermittently. Pt chewed his pills and RN had to suction fragments from his mouth after cuing him multiple times to attempt to clear with water. Pt unable to tell me his name initially but did tell me his name after a few attempts. He was not oriented to time or place. I notified Dr. Naaman Plummer of this change. Dr. Naaman Plummer discontinued hydrocodone and ordered a UA/Culture and CBC. I collected the urine sample and will continue to assess patients cognition.

## 2021-04-09 NOTE — Progress Notes (Addendum)
CT of head demonstrated expected post-op findings. CXR, however demonstrates bilateral basilar infiltrates. Initiated cefepime. Add 2L oxygen via [place] for comfort. Appreciate pharmacy assistance with abx dosing. Re-check labs, CXR in AM. Pt comfortable, actually a little more alert. Wife at bedside. He ate a good bit of his lunch just now. Continue to observe.   [name], MD, [facility] 04/09/2021

## 2021-04-09 NOTE — Progress Notes (Addendum)
PROGRESS NOTE   Subjective/Complaints: Pt lethargic this morning. Denies any pain or problems breathing, urinating. Received hydrocodone at 2300 last night. No fevers  ROS: limited d/t  mental state .   Objective:   No results found. Recent Labs    04/07/21 0544 04/09/21 1022  WBC 7.2 21.9*  HGB 14.0 14.7  HCT 40.5 42.3  PLT 219 233   Recent Labs    04/07/21 0544 04/09/21 0529  NA 131* 128*  K 4.1 3.7  CL 95* 92*  CO2 28 25  GLUCOSE 117* 148*  BUN 17 15  CREATININE 0.74 0.73  CALCIUM 8.8* 9.0    Intake/Output Summary (Last 24 hours) at 04/09/2021 1123 Last data filed at 04/09/2021 0840 Gross per 24 hour  Intake 620 ml  Output 1300 ml  Net -680 ml        Physical Exam: Vital Signs Blood pressure 103/69, pulse 69, temperature 98 F (36.7 C), temperature source Oral, resp. rate 16, height 5\' 8"  (1.727 m), SpO2 98 %.  Constitutional: No distress but slow to engage. Vital signs reviewed. HEENT: EOMI, oral membranes moist Neck: supple Cardiovascular: RRR without murmur. No JVD    Respiratory/Chest: CTA Bilaterally without wheezes or rales. Normal effort    GI/Abdomen: BS +, non-tender, non-distended Ext: no clubbing, cyanosis, or edema Psych: lethargic Skin: scalp incision with staples, some associated scab in hair, loose Neuro: lethargic. Does follow commands, speech dysarthric, follows simple commands.  Left side sl weaker than right but doesn't engage fully. No focal sensory deficits. Mild left inattention Musculoskeletal: Full ROM, No pain with AROM or PROM in the neck, trunk, or extremities. Posture appropriate      Assessment/Plan: 1. Functional deficits which require 3+ hours per day of interdisciplinary therapy in a comprehensive inpatient rehab setting. Physiatrist is providing close team supervision and 24 hour management of active medical problems listed below. Physiatrist and rehab team  continue to assess barriers to discharge/monitor patient progress toward functional and medical goals  Care Tool:  Bathing    Body parts bathed by patient: Right arm, Left arm, Chest, Abdomen, Front perineal area, Right upper leg, Left upper leg, Right lower leg, Left lower leg, Face   Body parts bathed by helper: Buttocks     Bathing assist Assist Level: Minimal Assistance - Patient > 75%     Upper Body Dressing/Undressing Upper body dressing   What is the patient wearing?: Pull over shirt    Upper body assist Assist Level: Minimal Assistance - Patient > 75%    Lower Body Dressing/Undressing Lower body dressing      What is the patient wearing?: Pants     Lower body assist Assist for lower body dressing: Moderate Assistance - Patient 50 - 74%     Toileting Toileting    Toileting assist Assist for toileting: Independent with assistive device Assistive Device Comment: urinal   Transfers Chair/bed transfer  Transfers assist     Chair/bed transfer assist level: Minimal Assistance - Patient > 75%     Locomotion Ambulation   Ambulation assist      Assist level: Minimal Assistance - Patient > 75% Assistive device: Walker-rolling Max distance: 312  ft   Walk 10 feet activity   Assist     Assist level: Minimal Assistance - Patient > 75% Assistive device: Walker-rolling   Walk 50 feet activity   Assist    Assist level: Minimal Assistance - Patient > 75% Assistive device: Walker-rolling    Walk 150 feet activity   Assist    Assist level: Minimal Assistance - Patient > 75% Assistive device: Walker-rolling    Walk 10 feet on uneven surface  activity   Assist Walk 10 feet on uneven surfaces activity did not occur: Safety/medical concerns (decreased safety/spatial awareness)         Wheelchair     Assist        Wheelchair assist level: Supervision/Verbal cueing Max wheelchair distance: 204 ft    Wheelchair 50 feet with 2  turns activity    Assist        Assist Level: Supervision/Verbal cueing   Wheelchair 150 feet activity     Assist      Assist Level: Supervision/Verbal cueing   Blood pressure 103/69, pulse 69, temperature 98 F (36.7 C), temperature source Oral, resp. rate 16, height 5\' 8"  (1.727 m), SpO2 98 %.  Medical Problem List and Plan: 1.  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  Follow-up oncology services. Pathology positive for malignant melanoma             -patient may shower             -ELOS/Goals: 13-20 days, supervision for PT, OT, SLP  -Continue CIR therapies including PT, OT, and SLP  2.  Antithrombotics: -DVT/anticoagulation: Subcutaneous heparin.             -antiplatelet therapy: N/A 3. Pain Management: Hydrocodone as needed 4. Mood: Xanax 0.25 mg 3 times daily as needed             -antipsychotic agents: N/A 5. Neuropsych: This patient is capable of making decisions on his own behalf. 6. Skin/Wound Care: Routine skin checks             -keep scalp clean and dry--stable in appearance today 7. Fluids/Electrolytes/Nutrition: encourage PO  6/10 I personally reviewed the patient's labs today.    -worsened hyponatremia today 128    -check urine sodium   -IV NS   -1500 cc FR   -recheck in AM  -protein supplement for low albumin 8.  Hypertension.  Norvasc 5 mg daily, Avapro 300 mg daily, HCTZ 25 mg daily.  Monitor with increased mobility             -bp controlled 6/10---avoid hypotension. May need to back off meds a bit 9.  Seizure prophylaxis.  Keppra 500 mg twice daily 10.  Glaucoma.  Followed by Dr. Satira Sark.  Continue eyedrops. 11.  History of asthma.  Continue inhalers as directed.  Check oxygen saturations every shift 12.  Constipation.               -miralax bid             -senna-s at bedtime  -sorbitol 6/8 with results 13. Altered  mental status  -doubt related to hyponatremia as it's been low for some time  -wbc's 21.9 today, steroids almost off   -UA negative, UCX pending   -CXR ordered  -CT head ordered  -could be related to 2300 hydrocodone given last night---observe for improvement as day goes on  -  neuro checks    LOS: 3 days A FACE TO FACE EVALUATION WAS PERFORMED  [name] 04/09/2021, 11:23 AM

## 2021-04-09 NOTE — Evaluation (Signed)
Bedside Swallow Evaluation   Patient Details  Name: Shannon Horton. MRN: [id number] Date of Birth: 10-25-1946  SLP Diagnosis:   NA Rehab Potential: NA  ELOS:   NA   Today's Date: 04/09/2021 SLP Individual Time: 9509-3267 SLP Individual Time Calculation (min): 45 min   Hospital Problem: Principal Problem:   Brain tumor [facility])  Past Medical History:  Past Medical History:  Diagnosis Date   Allergic rhinitis    gets shots per Dr. Velora Heckler   Allergy    sees Dr. Harold Hedge    Asthma    Glaucoma    sees Dr. Satira Sark    Hypertension    Malignant melanoma [facility])    right lower leg, diagnosed on1/21/11   Past Surgical History:  Past Surgical History:  Procedure Laterality Date   APPLICATION OF CRANIAL NAVIGATION N/A 03/31/2021   Procedure: APPLICATION OF CRANIAL NAVIGATION;  Surgeon: Karsten Ro, DO;  Location: [place];  Service: Neurosurgery;  Laterality: N/A;   CATARACT EXTRACTION     right ey, Dr. Satira Sark    COLONOSCOPY  12/15/2015   per Dr.Kaplan, clear, no repeats    CRANIOTOMY N/A 03/31/2021   Procedure: RIGHT FRONTAL CRANIOTOMY FOR TUMOR;  Surgeon: Karsten Ro, DO;  Location: [place];  Service: Neurosurgery;  Laterality: N/A;   ESOPHAGOGASTRODUODENOSCOPY     WITH ESOPHAGEAL DILATION 2006 PER dR. kAPLAN   REMOVAL  of melanoma  12/08/09   from right calf; per Dr. Monica Becton   removal of melanoma from left abdomen  01/21/10    per Dr. Denice Bors CELL CARCINOMA EXCISION  2013   per Dr. Terrence Dupont, from top of scalp     Assessment / Plan / Recommendation Clinical Impression   Patient presenting with AMS today per staff report and difficulty swallow oral medication. Head CT obtained today (6/10) with no acute abnormality identified. Chest X-ray consistent with pneumonia. Patient was alert upon arrival and oriented x4. Able to respond to basic questions regarding personal comfort and preferences with minimal difficulty.  SLP performed bedside swallow assessment with  noon meal. Spouse present at bedside and was provided feeding assist upon arrival d/t decreased mobility impacting self feeding per her report. Patient able to self feed if utensil or cup was handed to him. Overall swallow function appears WFL. Tolerated regular consistencies with effective mastication. Slight oral residue with solid consistencies which was effectively cleared with independent secondary swallow. Residue does not present increased concern at this time. Pt. tolerated single and sequential sips of thin liquid via straw with timely swallow initiation, and clear vocal quality post swallows. Patient was not observed swallowing medication. No overt s/sx of aspiration noted with all tested consistencies. Recommend continuation of regular diet and thin liquids. Meds whole and taken with thin liquids or in puree as tolerated.   Patient demonstrated difficulty keeping his eyes open shortly following meal and difficult to remain aroused. Recommend full supervision during meals due to fluctuating alertness and possible need for feeding assistance. Aspiration risk appears minimal from a swallowing standpoint, however increased with fluctuating alertness. Nurse was notified of full supervision status. Informed OT of possible self-feeding difficulties.   SLP provided education to spouse and son on cognitive-communication strategies and safety considerations with PO intake secondary to fluctuating alertness. Family displayed understanding through teach back of education. Session completed early secondary lethargy and limited participation. Patient left in bed with family present, alarm activated, and needs within reach.    Skilled Therapeutic Interventions  Completed Clinical bedside swallow evaluation, education with family on cognitive-communication and safe swallow precautions secondary to fluctuating alertness, and communication with nurse regarding full supervision status. Please see above for  details.   SLP Assessment   Recommend continuation of regular diet and thin liquids as tolerated. Meds taken whole with thin liquids or in puree. Initiate full supervision during meals secondary to fluctuating alertness.   Recommendations  SLP Diet Recommendations: Thin;Other (Comment) (Regular diet) Medication Administration: Whole meds with liquid Supervision: Full supervision/cueing for compensatory strategies;Staff to assist with self feeding Compensations: Minimize environmental distractions;Slow rate;Small sips/bites Postural Changes and/or Swallow Maneuvers: Seated upright 90 degrees Oral Care Recommendations: Oral care BID Patient destination: Home Follow up Recommendations: Outpatient SLP;24 hour supervision/assistance Equipment Recommended: None recommended by SLP    SLP Frequency   NA  SLP Duration  SLP Intensity  SLP Treatment/Interventions  NA   NA    NA   Pain Pain Assessment Pain Scale: 0-10 Pain Score: 0-No pain     Care Tool Care Tool Cognition Expression of Ideas and Wants Expression of Ideas and Wants: Some difficulty - exhibits some difficulty with expressing needs and ideas (e.g, some words or finishing thoughts) or speech is not clear   Understanding Verbal and Non-Verbal Content Understanding Verbal and Non-Verbal Content: Usually understands - understands most conversations, but misses some part/intent of message. Requires cues at times to understand   Memory/Recall Ability *first 3 days only      Bedside Swallowing Assessment General Date of Onset: 04/09/21 Previous Swallow Assessment: NA Diet Prior to this Study: Regular;Thin liquids Respiratory Status: Room air History of Recent Intubation: No Behavior/Cognition: Lethargic/Drowsy Self-Feeding Abilities: Needs assist Patient Positioning: Upright in bed Baseline Vocal Quality: Normal Volitional Cough: Weak Volitional Swallow: Able to elicit  Oral Care Assessment Does patient have any of  the following "high(er) risk" factors?: None of the above Does patient have any of the following "at risk" factors?: None of the above Ice Chips Ice chips: Not tested Thin Liquid Thin Liquid: Within functional limits Presentation: Straw Nectar Thick Nectar Thick Liquid: Not tested Honey Thick Honey Thick Liquid: Not tested Puree Puree: Within functional limits Presentation: Spoon Solid Solid: Impaired Oral Phase Functional Implications: Oral residue BSE Assessment Risk for Aspiration Impact on safety and function: Mild aspiration risk;Moderate aspiration risk Other Related Risk Factors: Other (comment);Lethargy;Cognitive impairment (current pneumonia)  Short Term Goals: NA   Refer to Care Plan for Long Term Goals  Recommendations for other services: Neuropsych  Discharge Criteria: Patient will be discharged from SLP if patient refuses treatment 3 consecutive times without medical reason, if treatment goals not met, if there is a change in medical status, if patient makes no progress towards goals or if patient is discharged from hospital.  The above assessment, treatment plan, treatment alternatives and goals were discussed and mutually agreed upon: by patient and by family  Patty Sermons 04/09/2021, 4:03 PM

## 2021-04-09 NOTE — IPOC Note (Signed)
Overall Plan of Care (IPOC) Patient Details Name: Shannon Horton. MRN: [id number] DOB: [birth year]  Admitting Diagnosis: Brain tumor [facility])  Hospital Problems: Principal Problem:   Brain tumor [facility])     Functional Problem List: Nursing Bowel, Safety, Medication Management, Endurance, Nutrition, Pain  PT Balance, Pain, Behavior, Perception, Edema, Safety, Sensory, Endurance, Motor, Skin Integrity, Nutrition  OT Balance, Cognition, Edema, Endurance, Motor, Perception, Safety, Sensory, Vision  SLP Cognition  TR         Basic ADL's: OT Eating, Grooming, Bathing, Dressing, Toileting     Advanced  ADL's: OT       Transfers: PT Bed Mobility, Bed to Chair, Car, Manufacturing systems engineer, Metallurgist: PT Ambulation, Emergency planning/management officer, Stairs     Additional Impairments: OT    SLP Social Cognition   Attention, Memory, Problem Solving  TR      Anticipated Outcomes Item Anticipated Outcome  Self Feeding S  Swallowing      Basic self-care  S  Toileting  S   Bathroom Transfers S  Bowel/Bladder  manage bowel with mod I assist  Transfers  mod I with LRAD  Locomotion  Supervision using LRAD >500 ft  Communication     Cognition  Mod I  Pain  pain at or below level 4  Safety/Judgment  maintain safety with cues/reminders   Therapy Plan: PT Intensity: Minimum of 1-2 x/day ,45 to 90 minutes PT Frequency: 5 out of 7 days PT Duration Estimated Length of Stay: 10-14 days OT Intensity: Minimum of 1-2 x/day, 45 to 90 minutes OT Frequency: 5 out of 7 days OT Duration/Estimated Length of Stay: 10-14 SLP Intensity: Minumum of 1-2 x/day, 30 to 90 minutes SLP Frequency: 3 to 5 out of 7 days SLP Duration/Estimated Length of Stay: 10-14   Due to the current state of emergency, patients may not be receiving their 3-hours of Medicare-mandated therapy.   Team Interventions: Nursing Interventions Bowel Management, Patient/Family Education, Medication Management,  Disease Management/Prevention, Discharge Planning, Pain Management  PT interventions Ambulation/gait training, Cognitive remediation/compensation, DME/adaptive equipment instruction, Discharge planning, Functional mobility training, Pain management, Psychosocial support, Splinting/orthotics, Therapeutic Activities, UE/LE Strength taining/ROM, Visual/perceptual remediation/compensation, Wheelchair propulsion/positioning, UE/LE Coordination activities, Therapeutic Exercise, Stair training, Skin care/wound management, Patient/family education, Neuromuscular re-education, Functional electrical stimulation, Disease management/prevention, Academic librarian, Training and development officer  OT Interventions Training and development officer, Discharge planning, Functional electrical stimulation, Pain management, Self Care/advanced ADL retraining, Therapeutic Activities, UE/LE Coordination activities, Cognitive remediation/compensation, Disease mangement/prevention, Functional mobility training, Patient/family education, Skin care/wound managment, Therapeutic Exercise, Visual/perceptual remediation/compensation, UE/LE Strength taining/ROM, Splinting/orthotics, Psychosocial support, Neuromuscular re-education, DME/adaptive equipment instruction, Community reintegration  SLP Interventions Cueing hierarchy, Functional tasks, Patient/family education, Internal/external aids  TR Interventions    SW/CM Interventions Discharge Planning, Psychosocial Support, Patient/Family Education   Barriers to Discharge MD  Medical stability  Nursing Decreased caregiver support, Home environment access/layout 2 level home main B+B 1 ste with spouse  PT Lack of/limited family support, Behavior    OT Incontinence    SLP      SW Decreased caregiver support, Lack of/limited family support     Team Discharge Planning: Destination: PT-Home ,OT- Home , SLP-Home Projected Follow-up: PT-Outpatient PT, OT-  Outpatient OT, SLP-Outpatient  SLP, 24 hour supervision/assistance Projected Equipment Needs: PT- , OT- 3 in 1 bedside comode, Tub/shower seat, SLP-None recommended by SLP Equipment Details: PT- , OT-  Patient/family involved in discharge planning: PT- Patient, Patient unable/family or caregiver not available,  OT-Patient, SLP-Patient, Family member/caregiver  MD ELOS: 10-14 days Medical Rehab Prognosis:  Excellent Assessment: The patient has been admitted for CIR therapies with the diagnosis of malignant melanoma to the brain. Course complicated by HCAP. The team will be addressing functional mobility, strength, stamina, balance, safety, adaptive techniques and equipment, self-care, bowel and bladder mgt, patient and caregiver education, NMR, cognition, communication. Goals have been set at supervision with self-care, supervision to mod I with mobility and mod I with cognition.   Due to the current state of emergency, patients may not be receiving their 3 hours per day of Medicare-mandated therapy.    [name], MD, FAAPMR     See Team Conference Notes for weekly updates to the plan of care

## 2021-04-09 NOTE — Progress Notes (Signed)
Physical Therapy Session Note  Patient Details  Name: Shannon Horton. MRN: [id number] Date of Birth: 12-25-1945  Today's Date: 04/09/2021 PT Individual Time: 6063-0160 PT Individual Time Calculation (min): 20 min  and Today's Date: 04/09/2021 PT Missed Time: 25 Minutes Missed Time Reason: Other (Comment);Nursing care  Short Term Goals: Week 1:  PT Short Term Goal 1 (Week 1): Patient will perform basic transfers with supervison consistently. PT Short Term Goal 2 (Week 1): Patient will ambulate >400 ft with CGA using LRAD. PT Short Term Goal 3 (Week 1): Patient will improve by MCID on the [facility] [place] Balance Test  Skilled Therapeutic Interventions/Progress Updates:    Patient received semi-reclined in bed, RN at bedside providing AM rx. RN noting that patient was having a difficult time following cues to swallow pills. Patient found to be either removing pills from his mouth, chewing them and not swallowing or just moving them around his mouth. Per PT chart review and conversation with primary OT, this is not patients baseline at CIR. PT assessing orientation asking patient if he knew where he was right now. After a very long pause, patient responded "holding [place]." PT asked patient what his name was. After a very long pause again patient responded "I don't know what you mean." RN aware of patients responses. Vitals assessed: 103/59 (74) 69BPM 94% O2 on RA. Patient was unable to follow commands to lift L arm to allow PT to don BP cuff (he did lift his R arm though). When PT lifted L arm, patient was able to maintain its position. No facial asymmetry noted. Patient not appropriate to participate in therapy at this time given change in status. RN aware and reports that she will made MD aware. PA was made aware. Bed alarm on, call light within reach, RN at bedside.   Therapy Documentation Precautions:  Precautions Precautions: Fall Precaution Comments: R crani Restrictions Weight Bearing  Restrictions: No    Therapy/Group: Individual Therapy  Karoline Caldwell, PT, DPT, CBIS  04/09/2021, 7:45 AM

## 2021-04-09 NOTE — Progress Notes (Signed)
Pharmacy Antibiotic Note  Shannon Horton. is a 75 y.o. male admitted on 04/06/2021 with  HCAP .  Pharmacy has been consulted for cefepime dosing.  WBC elevated at 21.9, afebrile  Plan: Cefepime 2gm IV q8h Monitor for clinical course, deescalation  Height: 5\' 8"  (172.7 cm) IBW/kg (Calculated) : 68.4  Temp (24hrs), Avg:97.6 F (36.4 C), Min:97.2 F (36.2 C), Max:98 F (36.7 C)  Recent Labs  Lab 04/06/21 1753 04/07/21 0544 04/09/21 0529 04/09/21 1022  WBC 7.6 7.2  --  21.9*  CREATININE 0.72 0.74 0.73  --     Estimated Creatinine Clearance: 78.4 mL/min (by C-G formula based on SCr of 0.73 mg/dL).    Allergies  Allergen Reactions   Banana Rash    Antimicrobials this admission: 6/10 cefepime >>   Dose adjustments this admission: N/a   Shannon Horton A. Levada Dy, PharmD, BCPS, FNKF Clinical Pharmacist [place] Please utilize Amion for appropriate phone number to reach the unit pharmacist ([place])  04/09/2021 1:22 PM

## 2021-04-09 NOTE — Progress Notes (Signed)
Occupational Therapy Note  Patient Details  Name: Shannon Horton. MRN: [id number] Date of Birth: 11-03-1945  Today's Date: 04/09/2021 OT Missed Time: 42 Minutes Missed Time Reason: Patient fatigue  Pt greeted semi-reclined in bed asleep with family present. Pt very lethargic and unable to maintain alertness to participate in OT at this time. OT to follow up per plan of care.    Daneen Schick Chasta Deshpande 04/09/2021, 2:56 PM

## 2021-04-09 NOTE — Progress Notes (Signed)
Occupational Therapy Session Note  Patient Details  Name: Shannon Horton. MRN: [id number] Date of Birth: February 20, 1946  Today's Date: 04/09/2021 OT Missed Time: 48 Minutes Missed Time Reason: Other (comment) (RN hold/change in status)   Short Term Goals: Week 1:  OT Short Term Goal 1 (Week 1): Pt iwll don shirt sitting unsupported wiht S for components and sitting balance OT Short Term Goal 2 (Week 1): Pt will maintain dynamic sitting blaance with S and min cuing for midline orientation OT Short Term Goal 3 (Week 1): Pt will transfer to toilet with CGA and no more than MIN VC for RW management/proximity OT Short Term Goal 4 (Week 1): Pt will thread BLE into pants  Pt missed 60 min skilled OT therapy d/t change in status and RN hold. Will follow up as appropriate   Tonny Branch 04/09/2021, 6:57 AM

## 2021-04-10 ENCOUNTER — Inpatient Hospital Stay (HOSPITAL_COMMUNITY): Payer: Medicare PPO

## 2021-04-10 DIAGNOSIS — J189 Pneumonia, unspecified organism: Secondary | ICD-10-CM

## 2021-04-10 DIAGNOSIS — R5383 Other fatigue: Secondary | ICD-10-CM

## 2021-04-10 DIAGNOSIS — D72825 Bandemia: Secondary | ICD-10-CM

## 2021-04-10 LAB — BASIC METABOLIC PANEL
Anion gap: 8 (ref 5–15)
BUN: 12 mg/dL (ref 8–23)
CO2: 28 mmol/L (ref 22–32)
Calcium: 8.5 mg/dL — ABNORMAL LOW (ref 8.9–10.3)
Chloride: 91 mmol/L — ABNORMAL LOW (ref 98–111)
Creatinine, Ser: 0.66 mg/dL (ref 0.61–1.24)
GFR, Estimated: >60 mL/min (ref >60)
Glucose, Bld: 122 mg/dL — ABNORMAL HIGH (ref 70–99)
Potassium: 3.5 mmol/L (ref 3.5–5.1)
Sodium: 127 mmol/L — ABNORMAL LOW (ref 135–145)

## 2021-04-10 LAB — CBC
HCT: 36.1 % — ABNORMAL LOW (ref 39.0–52.0)
Hemoglobin: 12.6 g/dL — ABNORMAL LOW (ref 13.0–17.0)
MCH: 31.5 pg (ref 26.0–34.0)
MCHC: 34.9 g/dL (ref 30.0–36.0)
MCV: 90.3 fL (ref 80.0–100.0)
Platelets: 190 10*3/uL (ref 150–400)
RBC: 4 MIL/uL — ABNORMAL LOW (ref 4.22–5.81)
RDW: 12.2 % (ref 11.5–15.5)
WBC: 12.7 10*3/uL — ABNORMAL HIGH (ref 4.0–10.5)
nRBC: 0 % (ref 0.0–0.2)

## 2021-04-10 LAB — URINE CULTURE: Culture: NO GROWTH

## 2021-04-10 MED ORDER — PREDNISONE 20 MG PO TABS
20.0000 mg | ORAL_TABLET | Freq: Every day | ORAL | Status: AC
  Administered 2021-04-10 – 2021-04-13 (×4): 20 mg via ORAL
  Filled 2021-04-10 (×4): qty 1

## 2021-04-10 MED ORDER — IPRATROPIUM-ALBUTEROL 0.5-2.5 (3) MG/3ML IN SOLN
3.0000 mL | Freq: Four times a day (QID) | RESPIRATORY_TRACT | Status: DC
  Filled 2021-04-10: qty 3

## 2021-04-10 MED ORDER — IPRATROPIUM-ALBUTEROL 0.5-2.5 (3) MG/3ML IN SOLN: 3.0000 mL | Freq: Four times a day (QID) | RESPIRATORY_TRACT | Status: DC | PRN

## 2021-04-10 NOTE — Progress Notes (Signed)
PROGRESS NOTE   Subjective/Complaints:  Pt very lethargic.  BP running low- Laying down and sitting up- in 70Y-63Z systolic.   Is on fluid restriction due to Na of 127- however can have IVFs since has added sodium- just not free water >1500cc.   Has new pneumonia- causing lethargy- on Cefipime WBC down from 21 to 12.7k- much improved.    ROS: limited due to cognition/sedation   Objective:   DG Chest 2 View  Result Date: 04/10/2021 CLINICAL DATA:  Follow-up of pneumonia EXAM: CHEST - 2 VIEW COMPARISON:  1 day prior FINDINGS: Osteopenia. Hyperinflation. The Chin overlies the apices. Midline trachea. Normal heart size. Atherosclerosis in the transverse aorta. No pleural effusion or pneumothorax. Left greater than right base airspace disease, slightly increased. 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 IMPRESSION: Slightly increased left greater than right base airspace disease, suspicious for infection or aspiration. Underlying hyperinflation, suggesting COPD. Aortic Atherosclerosis (ICD10-I70.0). Electronically Signed   By: Abigail Miyamoto M.D.   On: 04/10/2021 08:34   DG Chest 2 View  Result Date: 04/09/2021 CLINICAL DATA:  Shortness of breath EXAM: CHEST - 2 VIEW COMPARISON:  Mar 25, 2021 chest radiograph and chest CT FINDINGS: 1 cm nodular opacity in the left upper lobe is again noted and unchanged. Airspace opacity consistent with pneumonia in the lung bases is present. The heart size and pulmonary vascularity are normal. No adenopathy appreciable. There is aortic atherosclerosis. Sclerotic focus noted in the left proximal humerus, stable. IMPRESSION: 1.  Bibasilar airspace opacity consistent with bibasilar pneumonia. 2. 1 cm nodular opacity right upper lobe, unchanged. Neoplastic focus of concern. 3.  Heart size normal. 4.  Aortic Atherosclerosis (ICD10-I70.0). 5.  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  Electronically Signed   By: Lowella Grip III M.D.   On: 04/09/2021 12:59   CT HEAD WO CONTRAST  Result Date: 04/09/2021 CLINICAL DATA:  Altered mental status. EXAM: CT HEAD WITHOUT CONTRAST TECHNIQUE: Contiguous axial images were obtained from the base of the skull through the vertex without intravenous contrast. COMPARISON:  Head CT scan 03/25/2021.  Brain MRI 03/26/2021. FINDINGS: Brain: The patient has undergone right frontal craniotomy for resection of a right frontal lobe mass since the prior exams. There is a small amount of pneumocephalus and a tiny amount of hemorrhage at the surgical site. No hydrocephalus, midline shift or evidence of acute infarct. Vascular: No hyperdense vessel or unexpected calcification. Skull: No acute abnormality.  Right craniotomy defect noted. Sinuses/Orbits: Negative. Other: None. IMPRESSION: Status post resection of a right frontal lobe mass. Small amount of pneumocephalus and a tiny amount of hemorrhage at the surgical site are consistent with postoperative change. No acute abnormality is identified. Electronically Signed   By: Inge Rise M.D.   On: 04/09/2021 12:42   Recent Labs    04/09/21 1022 04/10/21 0508  WBC 21.9* 12.7*  HGB 14.7 12.6*  HCT 42.3 36.1*  PLT 233 190   Recent Labs    04/09/21 0529 04/10/21 0508  NA 128* 127*  K 3.7 3.5  CL 92* 91*  CO2 25 28  GLUCOSE 148* 122*  BUN 15 12  CREATININE 0.73 0.66  CALCIUM  9.0 8.5*    Intake/Output Summary (Last 24 hours) at 04/10/2021 1226 Last data filed at 04/10/2021 0818 Gross per 24 hour  Intake 560 ml  Output 750 ml  Net -190 ml        Physical Exam: Vital Signs Blood pressure 97/63, pulse (!) 57, temperature 98.2 F (36.8 C), temperature source Oral, resp. rate 18, height 5\' 8"  (1.727 m), SpO2 97 %.   General: lethargic- very hard to awaken for more than a few seconds;  NAD HENT: eyes closed; scalp incision- staples intact; oropharynx moist CV: regular rhythm;  bradycardic rate; no JVD Pulmonary: coarse breath sounds with some rhonchi- rare wheeze- adequate air movement B/L  GI: soft, NT, ND, (+)BS Psychiatric: very lethargic Neurological: not alert, woke briefly Skin: scalp incision with staples, some associated scab in hair, loose Neuro: lethargic. Doesn't follow commands except to open eyes for a few seconds, speech dysarthric, follows simple commands.  Left side sl weaker than right but doesn't engage fully. No focal sensory deficits. Mild left inattention Musculoskeletal: Full ROM, No pain with AROM or PROM in the neck, trunk, or extremities. Posture appropriate      Assessment/Plan: 1. Functional deficits which require 3+ hours per day of interdisciplinary therapy in a comprehensive inpatient rehab setting. Physiatrist is providing close team supervision and 24 hour management of active medical problems listed below. Physiatrist and rehab team continue to assess barriers to discharge/monitor patient progress toward functional and medical goals  Care Tool:  Bathing    Body parts bathed by patient: Right arm, Left arm, Chest, Abdomen, Front perineal area, Right upper leg, Left upper leg, Right lower leg, Left lower leg, Face   Body parts bathed by helper: Buttocks     Bathing assist Assist Level: Minimal Assistance - Patient > 75%     Upper Body Dressing/Undressing Upper body dressing   What is the patient wearing?: Pull over shirt    Upper body assist Assist Level: Minimal Assistance - Patient > 75%    Lower Body Dressing/Undressing Lower body dressing      What is the patient wearing?: Pants     Lower body assist Assist for lower body dressing: Moderate Assistance - Patient 50 - 74%     Toileting Toileting    Toileting assist Assist for toileting: Independent with assistive device Assistive Device Comment: urinal   Transfers Chair/bed transfer  Transfers assist     Chair/bed transfer assist level: Minimal  Assistance - Patient > 75%     Locomotion Ambulation   Ambulation assist      Assist level: Minimal Assistance - Patient > 75% Assistive device: Walker-rolling Max distance: 312 ft   Walk 10 feet activity   Assist     Assist level: Minimal Assistance - Patient > 75% Assistive device: Walker-rolling   Walk 50 feet activity   Assist    Assist level: Minimal Assistance - Patient > 75% Assistive device: Walker-rolling    Walk 150 feet activity   Assist    Assist level: Minimal Assistance - Patient > 75% Assistive device: Walker-rolling    Walk 10 feet on uneven surface  activity   Assist Walk 10 feet on uneven surfaces activity did not occur: Safety/medical concerns (decreased safety/spatial awareness)         Wheelchair     Assist        Wheelchair assist level: Supervision/Verbal cueing Max wheelchair distance: 204 ft    Wheelchair 50 feet with 2 turns activity  Assist        Assist Level: Supervision/Verbal cueing   Wheelchair 150 feet activity     Assist      Assist Level: Supervision/Verbal cueing   Blood pressure 97/63, pulse (!) 57, temperature 98.2 F (36.8 C), temperature source Oral, resp. rate 18, height 5\' 8"  (1.727 m), SpO2 97 %.  Medical Problem List and Plan: 1.  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  Follow-up oncology services. Pathology positive for malignant melanoma             -patient may shower             -ELOS/Goals: 13-20 days, supervision for PT, OT, SLP  -con't PT; OT and SLP  2.  Antithrombotics: -DVT/anticoagulation: Subcutaneous heparin.             -antiplatelet therapy: N/A 3. Pain Management: Hydrocodone as needed 4. Mood: Xanax 0.25 mg 3 times daily as needed             -antipsychotic agents: N/A 5. Neuropsych: This patient is capable of making decisions on  his own behalf. 6. Skin/Wound Care: Routine skin checks             -keep scalp clean and dry--stable in appearance today 7. Fluids/Electrolytes/Nutrition: encourage PO  6/10 I personally reviewed the patient's labs today.    -worsened hyponatremia today 128    -check urine sodium   -IV NS   -1500 cc FR   -recheck in AM  6/11- con't Fluid restriction of 1500 free water- suggest gatorade?- can receive IVFs, free water is the issue  -protein supplement for low albumin 8.  Hypertension.  Norvasc 5 mg daily, Avapro 300 mg daily, HCTZ 25 mg daily.  Monitor with increased mobility             -bp controlled 6/10---avoid hypotension. May need to back off meds a bit  6/11- D/c Norvasc for now, BP is 80s/90s laying and worse sitting up- using TEDs, ACE wraps and Abd binder.  9.  Seizure prophylaxis.  Keppra 500 mg twice daily 10.  Glaucoma.  Followed by Dr. Satira Sark.  Continue eyedrops. 11.  History of asthma.  Continue inhalers as directed.  Check oxygen saturations every shift 12.  Constipation.               -miralax bid             -senna-s at bedtime  -sorbitol 6/8 with results 13. Altered mental status  -doubt related to hyponatremia as it's been low for some time  -wbc's 21.9 today, steroids almost off   -UA negative, UCX pending   -CXR ordered  -CT head ordered  -could be related to 2300 hydrocodone given last night---observe for improvement as day goes on  -neuro checks 14. Pneumonia- HCAP  6/11- on Cefipime- WBC down to 12 from 21k  - will add Prednisone 20 mg daily x 4 days- first dose today, because CXR looks a little worse today than yesterday- will also schedule Duonebs q6 hours x 3 days- then change to prn.     LOS: 4 days A FACE TO FACE EVALUATION WAS PERFORMED  Eufemio Strahm 04/10/2021, 12:26 PM

## 2021-04-10 NOTE — Progress Notes (Signed)
Patient's IV was leaking around the caatheter when writer attempted to hang the 1500 antibiotic. IV was discontinued and three attempts place a new IV were made without success. An order was placed  for the IV team to place an IV. Once this is done, the pharmacy will be contacted about rescheduling the antibiotics.

## 2021-04-10 NOTE — Progress Notes (Signed)
Occupational Therapy Session Note  Patient Details  Name: Shannon Horton. MRN: [id number] Date of Birth: [birth year]  Today's Date: 04/10/2021 OT Individual Time: 7253-6644 OT Individual Time Calculation (min): 57 min   Today's Date: 04/10/2021 OT Individual Time: 0347-4259 OT Individual Time Calculation (min): 43 min   Short Term Goals: Week 1:  OT Short Term Goal 1 (Week 1): Pt iwll don shirt sitting unsupported wiht S for components and sitting balance OT Short Term Goal 2 (Week 1): Pt will maintain dynamic sitting blaance with S and min cuing for midline orientation OT Short Term Goal 3 (Week 1): Pt will transfer to toilet with CGA and no more than MIN VC for RW management/proximity OT Short Term Goal 4 (Week 1): Pt will thread BLE into pants  Skilled Therapeutic Interventions/Progress Updates:    Session 1.   Pt received in bed with no report of pain. Pt able to use LUE functionally during session.  Vitals throughout session as follows: HOB elevated: 100/52 Compression socks donned HOB elevated- thigh highs: 88/58 After Eating breakfast: 94/56 EOB dressing LB 103/67  ADL:  Pt eats sittng in bed with teds and Aces donned to give BP time to elevate. Deemed BP too low to it EOB initially. Pt with no overt s/s of aspiration with breakfast. Overall pt demo decreased noticing of items on tray. Initailly grabbing and opening creamer, but then reporting "oh I drink my coffee black." P then later opens jelly but demo poor continuation not spreading jelly and reaches for cup of water (not noticing paper on straw end). Pt distractable by extra items on tray and elimniated extra options/educated NT to improve efficiency with self feeding.  Pt completes LB dressing with MOD A seated EOB for threading BLE with MIN A to power up to standing for advancing pants past hips with MOD A. Total A to faciliate weight shifting to step laterall to [facility]. Pt demo increased difficulty with motor planning.   Pt completes footwear with total A to don teds and non skid socks. L lean greater this date with pt stating, "ive been leaning L my whole life" when OT educates that it is related to brain surgery.  Pt left at end of session in bed with exit alarm on, call light in reach and all needs met  Session 2:  Pt received in recliner with 0 out of 10 pain. 119/64 after standing/LE therex to warm up  ADL:   Pt completes toileting with MAX A for clothing management and no hygeien needed. Pt with incontinent bladder void in brief and requreis total A to change brief and increased time to attempt to void more sitting on BSC over toilet Pt completes toileting transfer with MIN A with grab bar and MOD A to ambulate out of bathroom. Pt demo shuffling gait and L trunk lean in sitting/standing and with mobility requiring max cuing to correct  Therapeutic activity STS x3 at mirror with MIN A to power up with RW but pt loses midline ~3 seconds and sits prematurely on fisrt stand.  Pt left at end of session in bed with exit alarm on, call light in reach and all needs met   Therapy Documentation Precautions:  Precautions Precautions: Fall Precaution Comments: R crani Restrictions Weight Bearing Restrictions: No General:   Vital Signs: Therapy Vitals Temp: 98.2 F (36.8 C) Temp Source: Oral Pulse Rate: (!) 57 Resp: 18 BP: 97/63 Patient Position (if appropriate): Lying Oxygen Therapy SpO2: 97 % O2 Device:  Room Air Pain:   ADL: ADL Eating: Set up Where Assessed-Eating: Bed level Grooming: Minimal assistance Where Assessed-Grooming: Standing at sink Upper Body Bathing: Minimal assistance (sitting balance) Where Assessed-Upper Body Bathing: Shower Lower Body Bathing: Minimal assistance Where Assessed-Lower Body Bathing: Shower Upper Body Dressing: Minimal assistance Where Assessed-Upper Body Dressing: Chair Lower Body Dressing: Moderate assistance Where Assessed-Lower Body Dressing:  Chair Toileting: Minimal assistance Where Assessed-Toileting: Glass blower/designer: Psychiatric nurse Method: Counselling psychologist: Energy manager: Minimal assistance, Moderate cueing Social research officer, government Method: Heritage manager: Educational psychologist   Exercises:   Other Treatments:     Therapy/Group: Individual Therapy  Tonny Branch 04/10/2021, 6:45 AM

## 2021-04-10 NOTE — Progress Notes (Signed)
Orthopedic Tech Progress Note Patient Details:  Shannon Horton 1946/02/13 [id number]  Ortho Devices Type of Ortho Device: Abdominal binder Ortho Device/Splint Interventions: Ordered      Danton Sewer A Tsuyako Jolley 04/10/2021, 3:28 PM

## 2021-04-11 LAB — CBC WITH DIFFERENTIAL/PLATELET
Abs Immature Granulocytes: 0.04 10*3/uL (ref 0.00–0.07)
Basophils Absolute: 0 10*3/uL (ref 0.0–0.1)
Basophils Relative: 0 %
Eosinophils Absolute: 0 10*3/uL (ref 0.0–0.5)
Eosinophils Relative: 0 %
HCT: 34.4 % — ABNORMAL LOW (ref 39.0–52.0)
Hemoglobin: 12.2 g/dL — ABNORMAL LOW (ref 13.0–17.0)
Immature Granulocytes: 0 %
Lymphocytes Relative: 11 %
Lymphs Abs: 1 10*3/uL (ref 0.7–4.0)
MCH: 32 pg (ref 26.0–34.0)
MCHC: 35.5 g/dL (ref 30.0–36.0)
MCV: 90.3 fL (ref 80.0–100.0)
Monocytes Absolute: 0.7 10*3/uL (ref 0.1–1.0)
Monocytes Relative: 7 %
Neutro Abs: 7.7 10*3/uL (ref 1.7–7.7)
Neutrophils Relative %: 82 %
Platelets: 175 10*3/uL (ref 150–400)
RBC: 3.81 MIL/uL — ABNORMAL LOW (ref 4.22–5.81)
RDW: 12 % (ref 11.5–15.5)
WBC: 9.4 10*3/uL (ref 4.0–10.5)
nRBC: 0 % (ref 0.0–0.2)

## 2021-04-11 LAB — BASIC METABOLIC PANEL
Anion gap: 8 (ref 5–15)
BUN: 11 mg/dL (ref 8–23)
CO2: 28 mmol/L (ref 22–32)
Calcium: 8.5 mg/dL — ABNORMAL LOW (ref 8.9–10.3)
Chloride: 96 mmol/L — ABNORMAL LOW (ref 98–111)
Creatinine, Ser: 0.51 mg/dL — ABNORMAL LOW (ref 0.61–1.24)
GFR, Estimated: >60 mL/min (ref >60)
Glucose, Bld: 111 mg/dL — ABNORMAL HIGH (ref 70–99)
Potassium: 3.5 mmol/L (ref 3.5–5.1)
Sodium: 132 mmol/L — ABNORMAL LOW (ref 135–145)

## 2021-04-11 NOTE — Progress Notes (Signed)
Speech Language Pathology Daily Session Note  Patient Details  Name: Shannon Horton. MRN: [id number] Date of Birth: 04/15/1946  Today's Date: 04/11/2021 SLP Individual Time: 5035-4656 SLP Individual Time Calculation (min): 40 min  Short Term Goals: Week 1: SLP Short Term Goal 1 (Week 1): Patient will recall new, functional daily information with Min verbal and visual cues SLP Short Term Goal 2 (Week 1): Pt will increase sustained attention for functional tasks to 15 minutes provided min A cues SLP Short Term Goal 3 (Week 1): Pt will complete mildly complex problem solving tasks with 75% accuracy and Min A cues  Skilled Therapeutic Interventions:  Pt was seen for skilled ST targeting cognitive goals.  Pt was awake, alert, and agreeable to participating in treatment.  He was able to verbally recount his current hospital course with increased time needed for processing speed.  Pt denies any overt cognitive changes since surgery but required mod assist verbal cues for working memory of task rules and procedures as well as for task organization during a novel card game.  Pt was left in bed with bed alarm set and call bell within reach.  Continue per current plan of care.    Pain Pain Assessment Pain Scale: 0-10 Pain Score: 0-No pain  Therapy/Group: Individual Therapy  Skarleth Delmonico, Selinda Orion 04/11/2021, 9:31 AM

## 2021-04-11 NOTE — Progress Notes (Signed)
IV team nurse thoroughly assessed patient's site and notified writer that it was working well and patient was not experiencing pain. She did not feel it was necessary to place midline at this time. Was told to place IV team consult in the future if needed.

## 2021-04-11 NOTE — Progress Notes (Signed)
Physical Therapy Session Note  Patient Details  Name: Shannon Horton. MRN: [id number] Date of Birth: [birth year]  Today's Date: 04/11/2021 PT Individual Time: 1115-1200 and 1300-1400 PT Individual Time Calculation (min): 45 min and 60 min   Short Term Goals: Week 1:  PT Short Term Goal 1 (Week 1): Patient will perform basic transfers with supervison consistently. PT Short Term Goal 2 (Week 1): Patient will ambulate >400 ft with CGA using LRAD. PT Short Term Goal 3 (Week 1): Patient will improve by MCID on the [facility] Balance Test  Skilled Therapeutic Interventions/Progress Updates:     Session 1: Patient in bed asleep upon PT arrival. Patient aroused to tactile and verbal stimulation and agreeable to PT session. Patient denied pain during session. Patient perseverated on a conversation with a nurse the previous night. Patient with poor STM, did not recognize therapist from last week, patient reported that staff seemed concerned about him x2 during session, required orientation to change in function and education on interventions to improve patient function over the past few days both times without recall of previous discussion earlier in the session. Patient also incontinent of urine at beginning of session, patient previously continent and not wearing incontinence brief at this time. Patient unaware of incontinence.  Vitals: BP 98/62, HR 49, SPO2 98%  Therapeutic Activity: Bed Mobility: Patient performed rolling R/L with min A  to complete peri-care and doffing shorts and donning an clean incontinence brief with max-total A. Soiled bed linens were also removed by therapist during rolling. He performed supine to sit with supervision. Provided verbal cues for initiation of bringing his legs off the bed x2. Patient sat EOB with supervision with R lean x2, dropping to his elbow then returning to sitting when cued. Patient with poor awareness of lateral LOB each time.  Transfers: Patient performed  sit to/from stand x3 with min A-CGA using RW. Provided verbal cues for forward weight shift and shifting into his toes to prevent posterior bias, pushing up to stand and reaching back to sit for safe technique, and increased hip/knee/trunk extension in standing. Patient threaded pants over his feet in sitting with mod A due to non-skid socks adding resistance with clearing each foot and pulled them up in standing with supervision and CGA for balance.   Gait Training:  Patient ambulated 18 feet and 56 feet using RW with min A-CGA. Ambulated with decreased gait speed, decreased step length and height with intermittent shuffling gait, increased B hip and knee flexion in stance, forward trunk lean with increasing distance from RW with decreased attention, and downward head gaze. Provided verbal cues for erect posture, proximity to RW for safety, looking ahead, and increased step height.  Patient in recliner in the room, agreeable to sit following education on benefits of sitting OOB, at end of session with breaks locked, chair alarm set, and all needs within reach.   Session 2: Patient in recliner finishing lunch upon PT arrival. Patient alert and agreeable to PT session. Patient denied pain during session. Patient finished his cake with supervision, provided cues for small bites and completion of chewing prior to swallowing to prevent aspiration.   Therapeutic Activity: Transfers: Patient performed stand pivot recliner >w/c and w/c<>NuStep with min A and sit to/from stand x2 with CGA using RW and x2 without RW and mod A x1 due to not competing a turn prior to sitting. Provided verbal cues for hand placement and reaching back for safety, forward weight shift, completion of turns when  sitting, and increased hip/knee/trunk extension in standing.  Gait Training:  Patient ambulated 180 feet x2 with RW with blue tape on front of walker as visual cue for maintaining close proximity to walker with CGA with  intermittent min A for AD management. He also ambulated 25 feet x2 without an AD with min A focused on erect posture and increased weight shift for foot clearance. Ambulated as above with improved proximity to RW with visual cue and improved posture without AD.   Neuromuscular Re-ed: Patient performed the following lower extremity motor control activities in preparation for gait training: -10 min on NuStep using lower extremities only 20-30 SPM, WL 5 focused on reciprocal motion and lower extremity motor control with a salient task (patient is a pervious bike rider a baseline), required cues for attention to task and maintaining SPM, tactile cues for controlled extension on R  Patient in recliner with his wife in the room at end of session with breaks locked, chair alarm set, and all needs within reach.   Therapy Documentation Precautions:  Precautions Precautions: Fall Precaution Comments: R crani Restrictions Weight Bearing Restrictions: No    Therapy/Group: Individual Therapy  Jourdyn Ferrin L Starlit Raburn PT, DPT  04/11/2021, 5:01 PM

## 2021-04-11 NOTE — Progress Notes (Signed)
Occupational Therapy Session Note  Patient Details  Name: Shannon Horton. MRN: [id number] Date of Birth: Jun 14, 1946  Today's Date: 04/11/2021 OT Individual Time: 7341-9379 OT Individual Time Calculation (min): 55 min    Short Term Goals: Week 1:  OT Short Term Goal 1 (Week 1): Pt iwll don shirt sitting unsupported wiht S for components and sitting balance OT Short Term Goal 2 (Week 1): Pt will maintain dynamic sitting blaance with S and min cuing for midline orientation OT Short Term Goal 3 (Week 1): Pt will transfer to toilet with CGA and no more than MIN VC for RW management/proximity OT Short Term Goal 4 (Week 1): Pt will thread BLE into pants  Skilled Therapeutic Interventions/Progress Updates:    Pt received supine in bed with no c/o pain. Pt alert and fully oriented this morning, perhaps a little slower to initiate than last week but overall much clearer than last few days. Pt did still have moments of confusion, related to TV being on church program that he did not want. He was also perseverative on this church program and staff members "preaching to him". Pt initiated eating breakfast and full supervision was provided. No cueing required. Assisted RN in medication administration. RN also disconnected IV briefly for pt to doff and don new clean shirt. Supervision to don shirt long sitting in bed. Pt required min cueing to sequence transfer to EOB. Pt declined donning teds d/t reporting they were hurting him last night. He stood while BP was assessed with min A to power up and CGA in standing with RW use. Peri hygiene in standing with CGA, CGA to don boxers sit <> stand. Pt asymptomatic throughout session in re to previous OH. Pt returned to supine in bed and was left with all needs met, bed alarm set.  BP supine: 105/50. BP EOB: 105/61. BP standing: 102/57.  Therapy Documentation Precautions:  Precautions Precautions: Fall Precaution Comments: R crani Restrictions Weight Bearing  Restrictions: No   Therapy/Group: Individual Therapy  Curtis Sites 04/11/2021, 6:44 AM

## 2021-04-11 NOTE — Progress Notes (Signed)
PROGRESS NOTE   Subjective/Complaints:  Pt kept repeating self- Don't know how I was confused"- "don't think I have pneumonia- I feel fine".  Also kept telling me about weird dreams he had in last 2 days.  Per nursing, IV was leaking and missed 2pm dose of IV ABX yesterday- and IV "iffy".   Will have them place midline- has difficult to place IV situation.   Also c/o "accident" with bladder last night- sounds very confused about this.    ROS: limited due to confusion/tangential  Objective:   DG Chest 2 View  Result Date: 04/10/2021 CLINICAL DATA:  Follow-up of pneumonia EXAM: CHEST - 2 VIEW COMPARISON:  1 day prior FINDINGS: Osteopenia. Hyperinflation. The Chin overlies the apices. Midline trachea. Normal heart size. Atherosclerosis in the transverse aorta. No pleural effusion or pneumothorax. Left greater than right base airspace disease, slightly increased. 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 IMPRESSION: Slightly increased left greater than right base airspace disease, suspicious for infection or aspiration. Underlying hyperinflation, suggesting COPD. Aortic Atherosclerosis (ICD10-I70.0). Electronically Signed   By: Abigail Miyamoto M.D.   On: 04/10/2021 08:34   DG Chest 2 View  Result Date: 04/09/2021 CLINICAL DATA:  Shortness of breath EXAM: CHEST - 2 VIEW COMPARISON:  Mar 25, 2021 chest radiograph and chest CT FINDINGS: 1 cm nodular opacity in the left upper lobe is again noted and unchanged. Airspace opacity consistent with pneumonia in the lung bases is present. The heart size and pulmonary vascularity are normal. No adenopathy appreciable. There is aortic atherosclerosis. Sclerotic focus noted in the left proximal humerus, stable. IMPRESSION: 1.  Bibasilar airspace opacity consistent with bibasilar pneumonia. 2. 1 cm nodular opacity right upper lobe, unchanged. Neoplastic focus of concern. 3.  Heart size normal. 4.   Aortic Atherosclerosis (ICD10-I70.0). 5.  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 Electronically Signed   By: Lowella Grip III M.D.   On: 04/09/2021 12:59   Recent Labs    04/10/21 0508 04/11/21 0546  WBC 12.7* 9.4  HGB 12.6* 12.2*  HCT 36.1* 34.4*  PLT 190 175   Recent Labs    04/10/21 0508 04/11/21 0546  NA 127* 132*  K 3.5 3.5  CL 91* 96*  CO2 28 28  GLUCOSE 122* 111*  BUN 12 11  CREATININE 0.66 0.51*  CALCIUM 8.5* 8.5*    Intake/Output Summary (Last 24 hours) at 04/11/2021 1232 Last data filed at 04/11/2021 0735 Gross per 24 hour  Intake 700 ml  Output 275 ml  Net 425 ml        Physical Exam: Vital Signs Blood pressure (!) 105/50, pulse (!) 50, temperature 97.7 F (36.5 C), temperature source Oral, resp. rate 14, height 5\' 8"  (1.727 m), SpO2 96 %.    General: awake, alert, appropriate, sitting up in bed;  NAD HENT: conjugate gaze; oropharynx moist; crani site with staples intact- high forehead CV: regular rhythm; bradycardic rate; no JVD Pulmonary: CTA B/L; no W/R/R-but decreased at bases GI: soft, NT, ND, (+)BS Psychiatric: tangential- sounds somewhat confused still- wants to get OOB- explained not able to.  Neurological: Ox1-2 Skin: scalp incision with staples, some associated scab in hair, loose  Neuro: lethargic. Doesn't follow commands except to open eyes for a few seconds, speech dysarthric, follows simple commands.  Left side sl weaker than right but doesn't engage fully. No focal sensory deficits. Mild left inattention Musculoskeletal: Full ROM, No pain with AROM or PROM in the neck, trunk, or extremities. Posture appropriate      Assessment/Plan: 1. Functional deficits which require 3+ hours per day of interdisciplinary therapy in a comprehensive inpatient rehab setting. Physiatrist is providing close team supervision and 24 hour management of active medical problems listed below. Physiatrist and rehab team continue to assess  barriers to discharge/monitor patient progress toward functional and medical goals  Care Tool:  Bathing    Body parts bathed by patient: Right arm, Left arm, Chest, Abdomen, Front perineal area, Right upper leg, Left upper leg, Right lower leg, Left lower leg, Face   Body parts bathed by helper: Buttocks     Bathing assist Assist Level: Minimal Assistance - Patient > 75%     Upper Body Dressing/Undressing Upper body dressing   What is the patient wearing?: Pull over shirt    Upper body assist Assist Level: Minimal Assistance - Patient > 75%    Lower Body Dressing/Undressing Lower body dressing      What is the patient wearing?: Pants     Lower body assist Assist for lower body dressing: Moderate Assistance - Patient 50 - 74%     Toileting Toileting    Toileting assist Assist for toileting: Independent with assistive device Assistive Device Comment: urinal   Transfers Chair/bed transfer  Transfers assist     Chair/bed transfer assist level: Minimal Assistance - Patient > 75%     Locomotion Ambulation   Ambulation assist      Assist level: Minimal Assistance - Patient > 75% Assistive device: Walker-rolling Max distance: 312 ft   Walk 10 feet activity   Assist     Assist level: Minimal Assistance - Patient > 75% Assistive device: Walker-rolling   Walk 50 feet activity   Assist    Assist level: Minimal Assistance - Patient > 75% Assistive device: Walker-rolling    Walk 150 feet activity   Assist    Assist level: Minimal Assistance - Patient > 75% Assistive device: Walker-rolling    Walk 10 feet on uneven surface  activity   Assist Walk 10 feet on uneven surfaces activity did not occur: Safety/medical concerns (decreased safety/spatial awareness)         Wheelchair     Assist        Wheelchair assist level: Supervision/Verbal cueing Max wheelchair distance: 204 ft    Wheelchair 50 feet with 2 turns  activity    Assist        Assist Level: Supervision/Verbal cueing   Wheelchair 150 feet activity     Assist      Assist Level: Supervision/Verbal cueing   Blood pressure (!) 105/50, pulse (!) 50, temperature 97.7 F (36.5 C), temperature source Oral, resp. rate 14, height 5\' 8"  (1.727 m), SpO2 96 %.  Medical Problem List and Plan: 1.  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  Follow-up oncology services. Pathology positive for malignant melanoma             -patient may shower             -ELOS/Goals: 13-20 days, supervision for PT, OT, SLP  -con't PT, OT, and SLP  2.  Antithrombotics: -DVT/anticoagulation: Subcutaneous heparin.             -antiplatelet therapy: N/A 3. Pain Management: Hydrocodone as needed 4. Mood: Xanax 0.25 mg 3 times daily as needed             -antipsychotic agents: N/A 5. Neuropsych: This patient is capable of making decisions on his own behalf. 6. Skin/Wound Care: Routine skin checks             -keep scalp clean and dry--stable in appearance today 7. Fluids/Electrolytes/Nutrition: encourage PO  6/10 I personally reviewed the patient's labs today.    -worsened hyponatremia today 128    -check urine sodium   -IV NS   -1500 cc FR   -recheck in AM  6/11- con't Fluid restriction of 1500 free water- suggest gatorade?- can receive IVFs, free water is the issue  6/12- Na 132 today- much better- con't regimen- and follow per Dr Naaman Plummer  -protein supplement for low albumin 8.  Hypertension.  Norvasc 5 mg daily, Avapro 300 mg daily, HCTZ 25 mg daily.  Monitor with increased mobility             -bp controlled 6/10---avoid hypotension. May need to back off meds a bit  6/11- D/c Norvasc for now, BP is 80s/90s laying and worse sitting up- using TEDs, ACE wraps and Abd binder.   6/12- BP a little better- still soft- 100s/50s-  9.   Seizure prophylaxis.  Keppra 500 mg twice daily 10.  Glaucoma.  Followed by Dr. Satira Sark.  Continue eyedrops. 11.  History of asthma.  Continue inhalers as directed.  Check oxygen saturations every shift 12.  Constipation.               -miralax bid             -senna-s at bedtime  -sorbitol 6/8 with results 13. Altered mental status  -doubt related to hyponatremia as it's been low for some time  -wbc's 21.9 today, steroids almost off   -UA negative, UCX pending   -CXR ordered  -CT head ordered  -could be related to 2300 hydrocodone given last night---observe for improvement as day goes on  -neuro checks 14. Pneumonia- HCAP  6/11- on Cefipime- WBC down to 12 from 21k  - will add Prednisone 20 mg daily x 4 days- first dose today, because CXR looks a little worse today than yesterday- will also schedule Duonebs q6 hours x 3 days- then change to prn.   6/12- WBC down to 9.4- still a little confused- not sure how much is baseline?- con't regimen as above.  Get pt midline for IV access.     LOS: 5 days A FACE TO FACE EVALUATION WAS PERFORMED  Dutchess Crosland 04/11/2021, 12:32 PM

## 2021-04-12 LAB — CBC WITH DIFFERENTIAL/PLATELET
Abs Immature Granulocytes: 0.03 10*3/uL (ref 0.00–0.07)
Basophils Absolute: 0 10*3/uL (ref 0.0–0.1)
Basophils Relative: 0 %
Eosinophils Absolute: 0.1 10*3/uL (ref 0.0–0.5)
Eosinophils Relative: 1 %
HCT: 35.1 % — ABNORMAL LOW (ref 39.0–52.0)
Hemoglobin: 12.3 g/dL — ABNORMAL LOW (ref 13.0–17.0)
Immature Granulocytes: 1 %
Lymphocytes Relative: 20 %
Lymphs Abs: 1.2 10*3/uL (ref 0.7–4.0)
MCH: 31.5 pg (ref 26.0–34.0)
MCHC: 35 g/dL (ref 30.0–36.0)
MCV: 90 fL (ref 80.0–100.0)
Monocytes Absolute: 0.5 10*3/uL (ref 0.1–1.0)
Monocytes Relative: 8 %
Neutro Abs: 4.3 10*3/uL (ref 1.7–7.7)
Neutrophils Relative %: 70 %
Platelets: 166 10*3/uL (ref 150–400)
RBC: 3.9 MIL/uL — ABNORMAL LOW (ref 4.22–5.81)
RDW: 12.1 % (ref 11.5–15.5)
WBC: 6.1 10*3/uL (ref 4.0–10.5)
nRBC: 0 % (ref 0.0–0.2)

## 2021-04-12 LAB — BASIC METABOLIC PANEL
Anion gap: 7 (ref 5–15)
BUN: 11 mg/dL (ref 8–23)
CO2: 28 mmol/L (ref 22–32)
Calcium: 8.7 mg/dL — ABNORMAL LOW (ref 8.9–10.3)
Chloride: 98 mmol/L (ref 98–111)
Creatinine, Ser: 0.62 mg/dL (ref 0.61–1.24)
GFR, Estimated: >60 mL/min (ref >60)
Glucose, Bld: 94 mg/dL (ref 70–99)
Potassium: 3.4 mmol/L — ABNORMAL LOW (ref 3.5–5.1)
Sodium: 133 mmol/L — ABNORMAL LOW (ref 135–145)

## 2021-04-12 MED ORDER — MAGNESIUM GLUCONATE 500 MG PO TABS
250.0000 mg | ORAL_TABLET | Freq: Every day | ORAL | Status: DC
  Administered 2021-04-12 – 2021-04-17 (×6): 250 mg via ORAL
  Filled 2021-04-12 (×7): qty 1

## 2021-04-12 MED ORDER — POTASSIUM CHLORIDE 20 MEQ PO PACK
40.0000 meq | PACK | Freq: Once | ORAL | Status: AC
  Administered 2021-04-12: 40 meq via ORAL
  Filled 2021-04-12: qty 2

## 2021-04-12 NOTE — Progress Notes (Signed)
Pharmacy Antibiotic Note  Shannon Horton. is a 75 y.o. male admitted on 04/06/2021 with  HCAP WBC trending down  Scr stable  Plan: Cefepime 2gm IV q8h -> 5 day stop date added Monitor for clinical course, deescalation  Height: 5\' 8"  (172.7 cm) IBW/kg (Calculated) : 68.4  Temp (24hrs), Avg:97.7 F (36.5 C), Min:97.7 F (36.5 C), Max:97.8 F (36.6 C)  Recent Labs  Lab 04/07/21 0544 04/09/21 0529 04/09/21 1022 04/10/21 0508 04/11/21 0546 04/12/21 0600  WBC 7.2  --  21.9* 12.7* 9.4 6.1  CREATININE 0.74 0.73  --  0.66 0.51* 0.62     Estimated Creatinine Clearance: 78.4 mL/min (by C-G formula based on SCr of 0.62 mg/dL).    Allergies  Allergen Reactions   Banana Rash    Antimicrobials this admission: 6/10 cefepime >>   Dose adjustments this admission: N/a  Thank you Anette Guarneri, PharmD Please utilize [employer]ion for appropriate phone number to reach the unit pharmacist ([place])  04/12/2021 9:05 AM

## 2021-04-12 NOTE — Progress Notes (Signed)
Speech Language Pathology Daily Session Note  Patient Details  Name: Shannon Horton. MRN: [id number] Date of Birth: October 01, 1946  Today's Date: 04/12/2021 SLP Individual Time: 1000-1100 SLP Individual Time Calculation (min): 60 min  Short Term Goals: Week 1: SLP Short Term Goal 1 (Week 1): Patient will recall new, functional daily information with Min verbal and visual cues SLP Short Term Goal 2 (Week 1): Pt will increase sustained attention for functional tasks to 15 minutes provided min A cues SLP Short Term Goal 3 (Week 1): Pt will complete mildly complex problem solving tasks with 75% accuracy and Min A cues  Skilled Therapeutic Interventions: Skilled SLP treatment performed with focus on cognitive goals. Despite being oriented to all concepts during ST session, patient was accompanied by his son Tabitha who reported patient was disoriented to place and situation upon waking up this morning. Required Mod A verbal and visual cues for working memory of task rules and sustained attention with unfamiliar card game. Patient was able to sustain attention for approximately 5 minutes before warranting SLP support/re-direction and benefited from intermittent breaks (~5 minutes) between activities to improve focus. Required mod-to-max verbal and visual cues for mildly complex verbal organization task. ST placed orientation visual in patient's room due to report of disorientation upon waking. Patient was left in recliner chair with alarm activated and needs within reach. Continue with current plan of care.  Pain Pain Assessment Pain Scale: 0-10 Pain Score: 0-No pain  Therapy/Group: Individual Therapy  Patty Sermons 04/12/2021, 12:02 PM

## 2021-04-12 NOTE — Progress Notes (Signed)
Occupational Therapy Session Note  Patient Details  Name: Shannon Horton. MRN: [id number] Date of Birth: 10-18-1946  Today's Date: 04/12/2021 OT Individual Time: 1300-1415 OT Individual Time Calculation (min): 75 min    Short Term Goals: Week 1:  OT Short Term Goal 1 (Week 1): Pt iwll don shirt sitting unsupported wiht S for components and sitting balance OT Short Term Goal 2 (Week 1): Pt will maintain dynamic sitting blaance with S and min cuing for midline orientation OT Short Term Goal 3 (Week 1): Pt will transfer to toilet with CGA and no more than MIN VC for RW management/proximity OT Short Term Goal 4 (Week 1): Pt will thread BLE into pants  Skilled Therapeutic Interventions/Progress Updates:    Pt received sitting in recliner with no c/o pain. Pt with more flat affect today overall- less chipper but reporting no concerns other than tiredness. Assisted pt in ordering breakfast and dinner- he benefited from being provided only 2 options rather than several at a time d/t attention deficits. BP seated 120/79. Pt slow to initiate but following all commands without assist. Pt completed ambulatory transfer to the sink with the RW, min cuieng for RW management and min A. Once pt initiated oral care he began slowly sinking into B knee flexion, self correcting and then sinking back down intermittently for the full 4 minutes he was standing. Min-mod A was provided, likely combination between fatigue and attention deficits. Pt was encouraged to sit for rest break to complete shaving task (with electric razor). He required intermittent cueing for attention to shaving task- recommend continued use of electric razor d/t safety concerns with a manual. OT assisted with hair care around incision on head. Pt requested to use the bathroom. Ambulatory transfer with CGA, however mod cueing required for sequencing transfer. CGA for toileting tasks overall. Pt with urinary incontinence in brief. He completed  peri hygiene in standing with CGA. He returned to w/c and then was brought to the therapy gym. He completed memory task on the BITS with 3 item sequence with 80% accuracy. He required min cueing for selective attention and sequencing. Pt was returned to his room and left supine with all needs met, bed alarm set.   Therapy Documentation Precautions:  Precautions Precautions: Fall Precaution Comments: R crani Restrictions Weight Bearing Restrictions: No    Therapy/Group: Individual Therapy  Curtis Sites 04/12/2021, 7:27 AM

## 2021-04-12 NOTE — Progress Notes (Signed)
PROGRESS NOTE   Subjective/Complaints: Denies pain Reports not sleeping well and moving his bowels twice per week which is less than usual for him. He does not want any stool softeners as he is embarrased about needing help to use the bathroom. Reassured him that we help all patients with this and he felt better.    ROS: +constipation  Objective:   No results found. Recent Labs    04/11/21 0546 04/12/21 0600  WBC 9.4 6.1  HGB 12.2* 12.3*  HCT 34.4* 35.1*  PLT 175 166   Recent Labs    04/11/21 0546 04/12/21 0600  NA 132* 133*  K 3.5 3.4*  CL 96* 98  CO2 28 28  GLUCOSE 111* 94  BUN 11 11  CREATININE 0.51* 0.62  CALCIUM 8.5* 8.7*    Intake/Output Summary (Last 24 hours) at 04/12/2021 1508 Last data filed at 04/12/2021 1151 Gross per 24 hour  Intake 716 ml  Output 1750 ml  Net -1034 ml        Physical Exam: Vital Signs Blood pressure 118/60, pulse (!) 55, temperature 98.4 F (36.9 C), resp. rate 18, height 5\' 8"  (1.727 m), SpO2 98 %. Gen: no distress, normal appearing HEENT: oral mucosa pink and moist, NCAT Cardio: Reg rate Chest: normal effort, normal rate of breathing Abd: soft, non-distended Ext: no edema Psych: pleasant, flat affect Neurological: Ox1-2 Skin: scalp incision with staples, some associated scab in hair, loose Neuro: lethargic. Doesn't follow commands except to open eyes for a few seconds, speech dysarthric, follows simple commands.  Left side sl weaker than right but doesn't engage fully. No focal sensory deficits. Mild left inattention Musculoskeletal: Full ROM, No pain with AROM or PROM in the neck, trunk, or extremities. Posture appropriate      Assessment/Plan: 1. Functional deficits which require 3+ hours per day of interdisciplinary therapy in a comprehensive inpatient rehab setting. Physiatrist is providing close team supervision and 24 hour management of active medical  problems listed below. Physiatrist and rehab team continue to assess barriers to discharge/monitor patient progress toward functional and medical goals  Care Tool:  Bathing    Body parts bathed by patient: Right arm, Left arm, Chest, Abdomen, Front perineal area, Right upper leg, Left upper leg, Right lower leg, Left lower leg, Face   Body parts bathed by helper: Buttocks     Bathing assist Assist Level: Minimal Assistance - Patient > 75%     Upper Body Dressing/Undressing Upper body dressing   What is the patient wearing?: Pull over shirt    Upper body assist Assist Level: Minimal Assistance - Patient > 75%    Lower Body Dressing/Undressing Lower body dressing      What is the patient wearing?: Pants     Lower body assist Assist for lower body dressing: Moderate Assistance - Patient 50 - 74%     Toileting Toileting    Toileting assist Assist for toileting: Independent with assistive device Assistive Device Comment: urinal   Transfers Chair/bed transfer  Transfers assist     Chair/bed transfer assist level: Minimal Assistance - Patient > 75%     Locomotion Ambulation   Ambulation assist  Assist level: Minimal Assistance - Patient > 75% Assistive device: Walker-rolling Max distance: 200 ft   Walk 10 feet activity   Assist     Assist level: Minimal Assistance - Patient > 75% Assistive device: Walker-rolling   Walk 50 feet activity   Assist    Assist level: Minimal Assistance - Patient > 75% Assistive device: Walker-rolling    Walk 150 feet activity   Assist    Assist level: Minimal Assistance - Patient > 75% Assistive device: Walker-rolling    Walk 10 feet on uneven surface  activity   Assist Walk 10 feet on uneven surfaces activity did not occur: Safety/medical concerns (decreased safety/spatial awareness)         Wheelchair     Assist        Wheelchair assist level: Supervision/Verbal cueing Max wheelchair  distance: 204 ft    Wheelchair 50 feet with 2 turns activity    Assist        Assist Level: Supervision/Verbal cueing   Wheelchair 150 feet activity     Assist      Assist Level: Supervision/Verbal cueing   Blood pressure 118/60, pulse (!) 55, temperature 98.4 F (36.9 C), resp. rate 18, height 5\' 8"  (1.727 m), SpO2 98 %.  Medical Problem List and Plan: 1.  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  Follow-up oncology services. Pathology positive for malignant melanoma             -patient may shower             -ELOS/Goals: 13-20 days, supervision for PT, OT, SLP  -Continue PT, OT, and SLP 2.  Antithrombotics: -DVT/anticoagulation: Subcutaneous heparin.             -antiplatelet therapy: N/A 3. Pain Management: N/A 4. Mood: Xanax 0.25 mg 3 times daily as needed             -antipsychotic agents: N/A 5. Neuropsych: This patient is capable of making decisions on his own behalf. 6. Skin/Wound Care: Routine skin checks             -keep scalp clean and dry--stable in appearance today 7. Fluids/Electrolytes/Nutrition: encourage PO  6/10 I personally reviewed the patient's labs today.    -worsened hyponatremia today 128    -check urine sodium   -IV NS   -1500 cc FR   -recheck in AM  6/11- con't Fluid restriction of 1500 free water- suggest gatorade?- can receive IVFs, free water is the issue  6/13: Na 133- monitor as per Dr. Naaman Plummer  -protein supplement for low albumin 8.  Hypertension.  Norvasc 5 mg daily, Avapro 300 mg daily, HCTZ 25 mg daily.  Monitor with increased mobility             -bp controlled 6/10---avoid hypotension. May need to back off meds a bit  6/11- D/c Norvasc for now, BP is 80s/90s laying and worse sitting up- using TEDs, ACE wraps and Abd binder.   6/12- BP a little better- still soft- 100s/50s-  9.  Seizure prophylaxis.   Keppra 500 mg twice daily 10.  Glaucoma.  Followed by Dr. Satira Sark.  Continue eyedrops. 11.  History of asthma.  Continue inhalers as directed.  Check oxygen saturations every shift 12.  Constipation.               -miralax bid             -  senna-s at bedtime  -sorbitol 6/8 with results  -magnesium gluconate 250mg  ordered HS 13. Altered mental status  -doubt related to hyponatremia as it's been low for some time  -wbc's 21.9 today, steroids almost off   -UA negative, UCX pending   -CXR ordered  -CT head ordered  -could be related to 2300 hydrocodone given last night---observe for improvement as day goes on  -neuro checks 14. Pneumonia- HCAP  6/11- on Cefipime- WBC down to 12 from 21k  - will add Prednisone 20 mg daily x 4 days- first dose today, because CXR looks a little worse today than yesterday- will also schedule Duonebs q6 hours x 3 days- then change to prn.   6/12- WBC down to 9.4- still a little confused- not sure how much is baseline?- con't regimen as above.  Get pt midline for IV access.  15. Insomnia: magnesium gluconate 250mg  ordered HS 16. Hypokalemia: 78meq [place] ordered, repeat K+ tomorrow    LOS: 6 days A FACE TO FACE EVALUATION WAS PERFORMED  Tykisha Areola P Lakeria Starkman 04/12/2021, 3:08 PM

## 2021-04-12 NOTE — Progress Notes (Signed)
Physical Therapy Session Note  Patient Details  Name: Shannon Horton. MRN: [id number] Date of Birth: 12/18/1945  Today's Date: 04/12/2021 PT Individual Time: 1500-1510 PT Individual Time Calculation (min): 10 min   Short Term Goals: Week 1:  PT Short Term Goal 1 (Week 1): Patient will perform basic transfers with supervison consistently. PT Short Term Goal 2 (Week 1): Patient will ambulate >400 ft with CGA using LRAD. PT Short Term Goal 3 (Week 1): Patient will improve by MCID on the [facility] Balance Test  Skilled Therapeutic Interventions/Progress Updates:     Session 1: Patient in bed upon PT arrival. Patient alert and agreeable to PT session. Patient denied pain during session. Patient with improved recall this morning, able to recall 3 activities or cues provided form yesterdays sessions.   Therapeutic Activity: Bed Mobility: Patient performed supine to sit with supervision in a flat bed without use of bed rail. Provided verbal cues for scooting forward to even his hips and reduce L lean. Patient donned pants EOB to thread lower extremities with min A and pulled them up in standing with supervision and increased time, required CGA for standing balance.  Transfers: Patient performed sit to/from stand x2 with min A and uncontrolled descent on first trial and CGA on second trial following NMR. Provided verbal cues for scooting forward, forward weight.  Gait Training:  Patient ambulated >200 feet using RW with blue tape on front of walker as visual cue for maintaining close proximity to walker with CGA x first 50% and min A x second 50% with fatigue. Ambulated with decreased gait speed, decreased step length and height with intermittent shuffling gait, increased B hip and knee flexion in stance, forward trunk lean with increasing distance from RW with decreased attention, and downward head gaze. Provided verbal cues for erect posture, proximity to RW for safety, looking ahead, and increased  step height.  Neuromuscular Re-ed: Patient performed the following lower extremity motor control and balance activities for improved performance with functional mobility: -sit to/from stand with B upper extremity use without AD 2x5 focused on sequencing for scooting forward and reaching back to sit and forward weight shift throughout, progressed from min A with max cues to supervision without cues  Patient in recliner in the room at end of session with breaks locked, chair alarm set, and all needs within reach.   Session 2: Patient in bed asleep upon PT arrival. Patient aroused to verbal and tactile stimulation. Patient declined getting OOB at this time, reporting "I don't really feel like it." PT asked depression screening questions and patient does report some signs of depression. Educated on coping strategies and patient predisposed factors for depression at this time. Educated on neuropsych services and patient agreeable to a consultation, will inform MD during conference tomorrow. Offered patient toileting, exercise on the NuStep for a salient activity, or use of music to improve mood and participation. Patient declined all interventions offered at this time. Patient also reported that his wife was unable to come to the hospital today due to having work done at their home. Patient performed scooting up in the bed with mod A x2, then requested to continue resting at this time.  Patient missed 20 min of skilled PT due to fatigue, RN made aware and confirmed patient with flat affect throughout the day. Will attempt to make-up missed time as able.    Patient sitting up in the bed at end of session with breaks locked, bed alarm set, and all  needs within reach.   Therapy Documentation Precautions:  Precautions Precautions: Fall Precaution Comments: R crani Restrictions Weight Bearing Restrictions: No General: PT Amount of Missed Time (min): 20 Minutes PT Missed Treatment Reason: Patient  fatigue    Therapy/Group: Individual Therapy  Shannon Horton L Ileen Kahre PT, DPT  04/12/2021, 3:27 PM

## 2021-04-13 LAB — CBC WITH DIFFERENTIAL/PLATELET
Abs Immature Granulocytes: 0.02 10*3/uL (ref 0.00–0.07)
Basophils Absolute: 0 10*3/uL (ref 0.0–0.1)
Basophils Relative: 0 %
Eosinophils Absolute: 0.1 10*3/uL (ref 0.0–0.5)
Eosinophils Relative: 1 %
HCT: 38.3 % — ABNORMAL LOW (ref 39.0–52.0)
Hemoglobin: 13.3 g/dL (ref 13.0–17.0)
Immature Granulocytes: 0 %
Lymphocytes Relative: 24 %
Lymphs Abs: 1.1 10*3/uL (ref 0.7–4.0)
MCH: 31.2 pg (ref 26.0–34.0)
MCHC: 34.7 g/dL (ref 30.0–36.0)
MCV: 89.9 fL (ref 80.0–100.0)
Monocytes Absolute: 0.4 10*3/uL (ref 0.1–1.0)
Monocytes Relative: 9 %
Neutro Abs: 3 10*3/uL (ref 1.7–7.7)
Neutrophils Relative %: 66 %
Platelets: 171 10*3/uL (ref 150–400)
RBC: 4.26 MIL/uL (ref 4.22–5.81)
RDW: 12.1 % (ref 11.5–15.5)
WBC: 4.6 10*3/uL (ref 4.0–10.5)
nRBC: 0 % (ref 0.0–0.2)

## 2021-04-13 LAB — BASIC METABOLIC PANEL
Anion gap: 7 (ref 5–15)
BUN: 9 mg/dL (ref 8–23)
CO2: 28 mmol/L (ref 22–32)
Calcium: 9.1 mg/dL (ref 8.9–10.3)
Chloride: 97 mmol/L — ABNORMAL LOW (ref 98–111)
Creatinine, Ser: 0.71 mg/dL (ref 0.61–1.24)
GFR, Estimated: >60 mL/min (ref >60)
Glucose, Bld: 96 mg/dL (ref 70–99)
Potassium: 3.5 mmol/L (ref 3.5–5.1)
Sodium: 132 mmol/L — ABNORMAL LOW (ref 135–145)

## 2021-04-13 LAB — VITAMIN D 25 HYDROXY (VIT D DEFICIENCY, FRACTURES): Vit D, 25-Hydroxy: 26.54 ng/mL — ABNORMAL LOW (ref 30–100)

## 2021-04-13 LAB — MAGNESIUM: Magnesium: 1.9 mg/dL (ref 1.7–2.4)

## 2021-04-13 MED ORDER — MAGNESIUM SULFATE IN D5W 1-5 GM/100ML-% IV SOLN
1.0000 g | Freq: Once | INTRAVENOUS | Status: AC
  Administered 2021-04-13: 1 g via INTRAVENOUS
  Filled 2021-04-13: qty 100

## 2021-04-13 MED ORDER — VITAMIN D (ERGOCALCIFEROL) 1.25 MG (50000 UNIT) PO CAPS
50000.0000 [IU] | ORAL_CAPSULE | ORAL | Status: DC
  Administered 2021-04-13: 50000 [IU] via ORAL
  Filled 2021-04-13: qty 1

## 2021-04-13 MED ORDER — POTASSIUM CHLORIDE 20 MEQ PO PACK
40.0000 meq | PACK | Freq: Two times a day (BID) | ORAL | Status: DC
  Administered 2021-04-13 – 2021-04-14 (×3): 40 meq via ORAL
  Filled 2021-04-13 (×3): qty 2

## 2021-04-13 MED ORDER — DIAZEPAM 5 MG PO TABS
ORAL_TABLET | ORAL | Status: AC
  Administered 2021-04-13: 5 mg
  Filled 2021-04-13: qty 1

## 2021-04-13 NOTE — Progress Notes (Signed)
PROGRESS NOTE   Subjective/Complaints: Denies complaints Feeling well this morning Discussed staple removal tomorrow- nursing order placed Discussed results of lab work- supplements ordered   ROS: +constipation, denies pain  Objective:   No results found. Recent Labs    04/12/21 0600 04/13/21 0558  WBC 6.1 4.6  HGB 12.3* 13.3  HCT 35.1* 38.3*  PLT 166 171   Recent Labs    04/12/21 0600 04/13/21 0558  NA 133* 132*  K 3.4* 3.5  CL 98 97*  CO2 28 28  GLUCOSE 94 96  BUN 11 9  CREATININE 0.62 0.71  CALCIUM 8.7* 9.1    Intake/Output Summary (Last 24 hours) at 04/13/2021 1226 Last data filed at 04/13/2021 0827 Gross per 24 hour  Intake 360 ml  Output 350 ml  Net 10 ml        Physical Exam: Vital Signs Blood pressure (!) 151/84, pulse 60, temperature 98.2 F (36.8 C), resp. rate 16, height 5\' 8"  (1.727 m), SpO2 97 %. Gen: no distress, normal appearing HEENT: oral mucosa pink and moist, NCAT Cardio: Reg rate Chest: normal effort, normal rate of breathing Abd: soft, non-distended Ext: no edema Psych: pleasant, flat affect Neurological: Ox1-2 Skin: scalp incision with staples, some associated scab in hair, loose Neuro: lethargic. Doesn't follow commands except to open eyes for a few seconds, speech dysarthric, follows simple commands.  Left side sl weaker than right but doesn't engage fully. No focal sensory deficits. Mild left inattention Musculoskeletal: Full ROM, No pain with AROM or PROM in the neck, trunk, or extremities. Posture appropriate      Assessment/Plan: 1. Functional deficits which require 3+ hours per day of interdisciplinary therapy in a comprehensive inpatient rehab setting. Physiatrist is providing close team supervision and 24 hour management of active medical problems listed below. Physiatrist and rehab team continue to assess barriers to discharge/monitor patient progress toward  functional and medical goals  Care Tool:  Bathing    Body parts bathed by patient: Right arm, Left arm, Chest, Abdomen, Front perineal area, Right upper leg, Left upper leg, Right lower leg, Left lower leg, Face   Body parts bathed by helper: Buttocks     Bathing assist Assist Level: Minimal Assistance - Patient > 75%     Upper Body Dressing/Undressing Upper body dressing   What is the patient wearing?: Pull over shirt    Upper body assist Assist Level: Minimal Assistance - Patient > 75%    Lower Body Dressing/Undressing Lower body dressing      What is the patient wearing?: Pants     Lower body assist Assist for lower body dressing: Moderate Assistance - Patient 50 - 74%     Toileting Toileting    Toileting assist Assist for toileting: Independent with assistive device Assistive Device Comment: urinal   Transfers Chair/bed transfer  Transfers assist     Chair/bed transfer assist level: Contact Guard/Touching assist Chair/bed transfer assistive device: Programmer, multimedia   Ambulation assist      Assist level: Minimal Assistance - Patient > 75% Assistive device: Walker-rolling Max distance: >100 ft   Walk 10 feet activity   Assist  Assist level: Contact Guard/Touching assist Assistive device: Walker-rolling   Walk 50 feet activity   Assist    Assist level: Minimal Assistance - Patient > 75% Assistive device: Walker-rolling    Walk 150 feet activity   Assist    Assist level: Minimal Assistance - Patient > 75% Assistive device: Walker-rolling    Walk 10 feet on uneven surface  activity   Assist Walk 10 feet on uneven surfaces activity did not occur: Safety/medical concerns (decreased safety/spatial awareness)         Wheelchair     Assist        Wheelchair assist level: Supervision/Verbal cueing Max wheelchair distance: 204 ft    Wheelchair 50 feet with 2 turns activity    Assist         Assist Level: Supervision/Verbal cueing   Wheelchair 150 feet activity     Assist      Assist Level: Supervision/Verbal cueing   Blood pressure (!) 151/84, pulse 60, temperature 98.2 F (36.8 C), resp. rate 16, height 5\' 8"  (1.727 m), SpO2 97 %.  Medical Problem List and Plan: 1.  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  Follow-up oncology services. Pathology positive for malignant melanoma             -patient may shower             -ELOS/Goals: 13-20 days, supervision for PT, OT, SLP  -Continue PT, OT, and SLP  -Interdisciplinary Team Conference today   2.  Antithrombotics: -DVT/anticoagulation: Subcutaneous heparin.             -antiplatelet therapy: N/A 3. Pain Management: N/A 4. Mood: Xanax 0.25 mg 3 times daily as needed             -antipsychotic agents: N/A 5. Neuropsych: This patient is capable of making decisions on his own behalf. 6. Skin/Wound Care: Routine skin checks             -keep scalp clean and dry--stable in appearance today 7. Fluids/Electrolytes/Nutrition: encourage PO  6/10 I personally reviewed the patient's labs today.    -worsened hyponatremia today 128    -check urine sodium   -IV NS   -1500 cc FR   -recheck in AM  6/11- con't Fluid restriction of 1500 free water- suggest gatorade?- can receive IVFs, free water is the issue  6/13: Na 133- monitor as per Dr. Naaman Plummer  6/14: Na 132, repeat tomorrow  -protein supplement for low albumin 8.  Hypertension.  Norvasc 5 mg daily, Avapro 300 mg daily, HCTZ 25 mg daily.  Monitor with increased mobility             -bp controlled 6/10---avoid hypotension. May need to back off meds a bit  6/11- D/c Norvasc for now, BP is 80s/90s laying and worse sitting up- using TEDs, ACE wraps and Abd binder.   6/12- BP a little better- still soft- 100s/50s-  9.  Seizure prophylaxis.  Keppra 500  mg twice daily 10.  Glaucoma.  Followed by Dr. Satira Sark.  Continue eyedrops. 11.  History of asthma.  Continue inhalers as directed.  Check oxygen saturations every shift 12.  Constipation.               -miralax bid             -senna-s at bedtime  -sorbitol 6/8 with results  -magnesium gluconate 250mg  ordered  HS  -IV mag 1mg  on 6/14 since level is 1.9- can also help with HTN 13. Altered mental status  -doubt related to hyponatremia as it's been low for some time  -wbc's 21.9 today, steroids almost off   -UA negative, UCX pending   -CXR ordered  -CT head ordered  -could be related to 2300 hydrocodone given last night---observe for improvement as day goes on  -neuro checks 14. Pneumonia- HCAP  6/11- on Cefipime- WBC down to 12 from 21k  - will add Prednisone 20 mg daily x 4 days- first dose today, because CXR looks a little worse today than yesterday- will also schedule Duonebs q6 hours x 3 days- then change to prn.   6/12- WBC down to 9.4- still a little confused- not sure how much is baseline?- con't regimen as above.  Get pt midline for IV access.  15. Insomnia: magnesium gluconate 250mg  ordered HS 16. Hypokalemia: 37meq klor BID ordered sice responded minimally to 1x 79meq supplement, repeat tomorrow.  17. Vitamin D deficiency: ergocalciferol 50,000U once per week x7 weeks ordered    LOS: 7 days A FACE TO FACE EVALUATION WAS PERFORMED  Martha Clan P Johany Hansman 04/13/2021, 12:26 PM

## 2021-04-13 NOTE — Patient Care Conference (Signed)
Inpatient RehabilitationTeam Conference and Plan of Care Update Date: 04/13/2021   Time: 10:14 AM    Patient Name: Shannon Horton.      Medical Record Number: [id number]  Date of Birth: 1945/12/26 Sex: Male         Room/Bed: 4W15C/4W15C-01 Payor Info: Payor: [employer] MEDICARE / Plan: [employer] MEDICARE CHOICE PPO / Product Type: *No Product type* /    Admit Date/Time:  04/06/2021  4:18 PM  Primary Diagnosis:  Brain tumor [facility])  Hospital Problems: Principal Problem:   Brain tumor [facility])    Expected Discharge Date: Expected Discharge Date: 04/27/21  Team Members Present: Physician leading conference: Dr. Leeroy Cha Care Coodinator Present: Erlene Quan, BSW;Alessio Bogan Creig Hines, RN, BSN, [place] Nurse Present: Dorthula Nettles, RN PT Present: Apolinar Junes, PT OT Present: Laverle Hobby, OT SLP Present: Weston Anna, SLP PPS Coordinator present : Ileana Ladd, PT     Current Status/Progress Goal Weekly Team Focus  Bowel/Bladder   patient had episodes of urinary incontinence, LBM 6/10  Patient  willl regain urinary continence, and have BM today  scheduled toileting and continue bowel regimen   Swallow/Nutrition/ Hydration             ADL's   More lethargic/slow to initiate this week, now improving. min A overall for LB ADLs, supervision UB  supervision overall  cognitive retraining, ADLs, transfers, functional activity tolerance, d/c planning   Mobility   Min A overall, gait >200 ft, change in function and cognition mid week, improving in the last few days  Supervision overall, gait >500 ft on 6 Min Walk Test, 1 step no rail for home entry and 12 steps 1 rail to access second level of home  Orientation, safety awareness, motor planning, activity tolerance, balance, functional mobility, gait and stair training, path finding, posture, patient/caregiver education   Communication             Safety/Cognition/ Behavioral Observations  Mod-to-max verbal and visual cues for  processing, recall, sustained attention.  Supervision-Mod I  complex problem solving, recall and selective attention   Pain   Patient has been having headaches, relived with tylenol  Patient will have less episodes of headaches  assess patient for pain and administer pain meds when needed.   Skin   post operative wound dry and intact, old dried blood but not actively bleeding  Healthy wound healing without signs of infection  Keep wound clean and dry. encouraged turning in bed to maintain healthy skin integrity     Discharge Planning:  Discharging home with spouse to provide 24/7 assistance, Sons to provide PRN assistance   Team Discussion: Discontinue staples tomorrow, increasing K+ dose today, giving IV Mag today, Vit D supplement high dose weekly for 7 weeks. Continent B/B, incision to scalp is clean, and dry. Abrasion to left arm and redness on bottom. Patient on target to meet rehab goals: yes, more lethargic, slow to initiate, min assist with ADL transfers. Supervision goals. Min assist progressing to contact guard assist. Working on activity tolerance. He is on a regular diet, thin liquids. Requires extra time and cues.  *See Care Plan and progress notes for long and short-term goals.   Revisions to Treatment Plan:  MD making medication adjustments.  Teaching Needs: Family education, medication management, skin/wound care, transfer training, gait training, balance training, endurance training, stair training, safety awareness.  Current Barriers to Discharge: Decreased caregiver support, Medical stability, Home enviroment access/layout, Wound care, Lack of/limited family support, Weight, Medication compliance, Behavior, and  Nutritional means  Possible Resolutions to Barriers: Continue current medications, provide emotional support.     Medical Summary Current Status: Denies pain, insomnia, abraison left arm and redness on bottom, low magnesium, low vitamin D, low potassium,  staples in place over crani incision, lethargy, slow initiation  Barriers to Discharge: Medical stability;Wound care  Barriers to Discharge Comments: Denies pain, insomnia, abraison left arm and redness on bottom, low magnesium, low vitamin D, low potassium, staples in place over crani incision, lethargy, slow initiation Possible Resolutions to [employer]: staples may d/ced tomorrow, supplement K+ with higher dose today and repeat K+ tomorrow, IV magnesium supplement today and repeat magnesium level tomorrow, supplement with high dose ergocalciferol once per week for 7 weeks   Continued Need for Acute Rehabilitation Level of Care: The patient requires daily medical management by a physician with specialized training in physical medicine and rehabilitation for the following reasons: Direction of a multidisciplinary physical rehabilitation program to maximize functional independence : Yes Medical management of patient stability for increased activity during participation in an intensive rehabilitation regime.: Yes Analysis of laboratory values and/or radiology reports with any subsequent need for medication adjustment and/or medical intervention. : Yes   I attest that I was present, lead the team conference, and concur with the assessment and plan of the team.   Cristi Loron 04/13/2021, 1:13 PM

## 2021-04-13 NOTE — Progress Notes (Signed)
Speech Language Pathology Daily Session Note  Patient Details  Name: Shannon Horton. MRN: [id number] Date of Birth: 10/02/1946  Today's Date: 04/13/2021 SLP Individual Time: 1000-1100 SLP Individual Time Calculation (min): 60 min  Short Term Goals: Week 1: SLP Short Term Goal 1 (Week 1): Patient will recall new, functional daily information with Min verbal and visual cues SLP Short Term Goal 2 (Week 1): Pt will increase sustained attention for functional tasks to 15 minutes provided min A cues SLP Short Term Goal 3 (Week 1): Pt will complete mildly complex problem solving tasks with 75% accuracy and Min A cues  Skilled Therapeutic Interventions:   Skilled SLP treatment performed with focus on cognitive goals. Patient was oriented x4. Reported confusion in regards to location and situation upon waking up this morning. He reports utilizing orientation visual "you are at [facility]" that was placed in room which helped him orient to location. SLP facilitated simple verbal reasoning task (compare/contrast) with 100% accuracy and occasional verbal cues. Mildly complex abstract reasoning and problem solving task with Mod verbal cues and 50% accuracy. Simple calculations (addition and subtraction) with 50% accuracy and mod-to-max verbal cues. Overall, patient was able to sustain attention during 2-3 minute intervals prior to requiring verbal re-direction. Suspect accuracy toward end of session was impacted by cognitive fatigue and/or sleepiness as patient progressively required more verbal re-direction and support from clinician and was observed nodding off to sleep at times. SLP educated patient's spouse on current status and cognitive-communication strategies for improved attention, orientation, and memory. Patient was left in recliner chair with alarm activated and needs within reach. Continue with current plan of care.   Pain Pain Assessment Pain Scale: 0-10 Pain Score: 0-No  pain  Therapy/Group: Individual Therapy  Patty Sermons 04/13/2021, 12:17 PM

## 2021-04-13 NOTE — Progress Notes (Signed)
Patient ID: Shannon Horton., male   DOB: [birth year], 75 y.o.   MRN: [id number] Team Conference Report to Patient/Family  Team Conference discussion was reviewed with the patient and caregiver, including goals, any changes in plan of care and target discharge date.  Patient and caregiver express understanding and are in agreement.  The patient has a target discharge date of 04/27/21.  Covering for primary SW, Auria   SW met with patient, spouse and son, introduced self, provided patient with updates. Spouse has question about oncology follow up. No additional questions or concerns   Dyanne Iha 04/13/2021, 1:53 PM

## 2021-04-13 NOTE — Patient Care Conference (Signed)
Patient presented with functional decline in cognition since initial evaluation, where he was considered to have mild-to-moderate impairment. This decline was mos apparent around the time of recent diagnosis of pneumonia. Currently presenting with at least moderate impairments most notable in orientation (mostly upon waking), attention, information processing, and problem solving. Currently needing Mod-to-Max verbal and visual cues for most cognitive tasks. Overall alertness appears to be improving. Completed BSE on 6/10 and observation during noon meal on 6/13. Tolerating regular diet and thin liquids in low stim environment within minimal concerns for dysphagia at this time. Continuing to address cognitive goals to enhance attention, orientation, recall, and problem solving with goals for min-to-mod assist at this time.

## 2021-04-13 NOTE — Progress Notes (Signed)
Occupational Therapy Session Note  Patient Details  Name: Shannon Horton. MRN: [id number] Date of Birth: Jul 21, 1946  Today's Date: 04/13/2021 OT Individual Time: 4332-9518 OT Individual Time Calculation (min): 60 min   Session 2: OT Individual Time: 1345-1415 OT Individual Time Calculation (min): 30 min    Short Term Goals: Week 1:  OT Short Term Goal 1 (Week 1): Pt iwll don shirt sitting unsupported wiht S for components and sitting balance OT Short Term Goal 2 (Week 1): Pt will maintain dynamic sitting blaance with S and min cuing for midline orientation OT Short Term Goal 3 (Week 1): Pt will transfer to toilet with CGA and no more than MIN VC for RW management/proximity OT Short Term Goal 4 (Week 1): Pt will thread BLE into pants  Skilled Therapeutic Interventions/Progress Updates:    Pt received sleeping soundly but he awoke easily with verbal cueing. He requested to eat breakfast. Pt slow to initiate this morning, however more appropriately conversing with OT so likely just sleepiness. Pt was able to set up his breakfast tray with no assist. He required occasional cueing for attention to task. Pt also with working memory deficits, attempting to use his phone several times despite OT telling him it had died and required more time to charge. Pt transferred to EOB with supervision. He had increased difficulty with sitting balance, frequent perturbations L, which he self corrected. EOB BP was 139/70. He completed ambulatory transfer into the bathroom with min A and min cueing for RW management. Pt had a L LOB toward the L when turning requiring mod A to correct. Pt BP once on toilet was 143/72. Pt voided BM. He required min A for brief change and peri hygiene. He returned to the recliner with an extremely forward flexed posture, requiring very close guarding ( min A) and frequent cueing for upright posture. Mod cueing for sequencing turn to recliner. Pt was left sitting up with all  needs  met, chair alarm set.    Session 2: Pt received sitting in the recliner with his son and wife present. Discussed home equipment needs and demonstrated use of TTB and recommendations for home. Pt completed stand pivot transfer to the w/c with min A, mod cueing for technique. Pt was taken via w/c to the ADL apt. He returned demonstration of a transfer with the TTB, requiring min A for LE management and mod cueing. Pt returned to his room and was left supine with all needs met, bed alarm set.   Therapy Documentation Precautions:  Precautions Precautions: Fall Precaution Comments: R crani Restrictions Weight Bearing Restrictions: No  Therapy/Group: Individual Therapy  Curtis Sites 04/13/2021, 7:25 AM

## 2021-04-13 NOTE — Progress Notes (Signed)
Occupational Therapy Weekly Progress Note  Patient Details  Name: Shannon Horton. MRN: [id number] Date of Birth: May 31, 1946  Beginning of progress report period: April 07, 2021 End of progress report period: April 13, 2021   Patient has met 3 of 4 short term goals. Pt has had a fluctuating medical status with new PNA affecting cognition and initiation. Despite this pt continues to clear and make functional gains. He is able to complete UB ADLs with supervision and is min A for LB ADLs. He requires mod cueing for RW management and sequencing, as well as min A- CGA for ADL transfers.   Patient continues to demonstrate the following deficits: muscle weakness, decreased coordination and decreased motor planning, decreased initiation, decreased attention, decreased awareness, decreased problem solving, decreased safety awareness, decreased memory, and delayed processing, and decreased standing balance, decreased postural control, and decreased balance strategies and therefore will continue to benefit from skilled OT intervention to enhance overall performance with BADL and iADL.  Patient progressing toward long term goals..  Continue plan of care.  OT Short Term Goals Week 1:  OT Short Term Goal 1 (Week 1): Pt iwll don shirt sitting unsupported wiht S for components and sitting balance OT Short Term Goal 1 - Progress (Week 1): Met OT Short Term Goal 2 (Week 1): Pt will maintain dynamic sitting blaance with S and min cuing for midline orientation OT Short Term Goal 2 - Progress (Week 1): Met OT Short Term Goal 3 (Week 1): Pt will transfer to toilet with CGA and no more than MIN VC for RW management/proximity OT Short Term Goal 3 - Progress (Week 1): Progressing toward goal OT Short Term Goal 4 (Week 1): Pt will thread BLE into pants OT Short Term Goal 4 - Progress (Week 1): Met Week 2:  OT Short Term Goal 1 (Week 2): STG= LTG d/t ELOS  Shannon Horton 04/13/2021, 7:59 AM

## 2021-04-13 NOTE — Plan of Care (Signed)
  Problem: RH Problem Solving Goal: LTG Patient will demonstrate problem solving for (SLP) Description: LTG:  Patient will demonstrate problem solving for basic/complex daily situations with cues  (SLP) Flowsheets (Taken 04/13/2021 1617) LTG: Patient will demonstrate problem solving for (SLP): Basic daily situations LTG Patient will demonstrate problem solving for: Minimal Assistance - Patient > 75% Note: Downgraded due to slow progress.    Problem: RH Memory Goal: LTG Patient will use memory compensatory aids to (SLP) Description: LTG:  Patient will use memory compensatory aids to recall biographical/new, daily complex information with cues (SLP) Flowsheets (Taken 04/13/2021 1617) LTG: Patient will use memory compensatory aids to (SLP): Moderate Assistance - Patient 50 - 74% Note: Downgraded due to slow progress.    Problem: RH Attention Goal: LTG Patient will demonstrate this level of attention during functional activites (SLP) Description: LTG:  Patient will will demonstrate this level of attention during functional activites (SLP) Flowsheets (Taken 04/13/2021 1617) Patient will demonstrate during cognitive/linguistic activities the attention type of: Sustained Patient will demonstrate this level of attention during cognitive/linguistic activities in: Controlled LTG: Patient will demonstrate this level of attention during cognitive/linguistic activities with assistance of (SLP): Minimal Assistance - Patient > 75% Number of minutes patient will demonstrate attention during cognitive/linguistic activities: 25 Note: Downgraded due to slow progress.

## 2021-04-13 NOTE — Progress Notes (Signed)
Physical Therapy Session Note  Patient Details  Name: Shannon Horton. MRN: [id number] Date of Birth: 1945-12-31  Today's Date: 04/13/2021 PT Individual Time: 0903-1000 PT Individual Time Calculation (min): 57 min   Short Term Goals: Week 1:  PT Short Term Goal 1 (Week 1): Patient will perform basic transfers with supervison consistently. PT Short Term Goal 2 (Week 1): Patient will ambulate >400 ft with CGA using LRAD. PT Short Term Goal 3 (Week 1): Patient will improve by MCID on the [facility] Balance Test  Skilled Therapeutic Interventions/Progress Updates:     Patient in recliner in the room upon PT arrival. Patient alert and agreeable to PT session. Patient denied pain during session. Patient's glasses loose with lens out, PT repaired glasses for patient and patient cleaned them with a wash cloth with set-up assist after.   Therapeutic Activity: Transfers: Patient performed sit to/from stand x3 with CGA using RW. Provided verbal cues for hand placement, forward weight shift, completing turn, and reaching back to sit.   Gait Training:  Patient ambulated >100 x2 feet with CGA-min A on first trial and CGA on second trial following NMR, see below, using RW with blue tape on front of walker as visual cue for maintaining close proximity to walker with. Ambulated with decreased gait speed, decreased step length and height with intermittent shuffling gait, increased B hip and knee flexion in stance, forward trunk lean with increasing distance from RW with decreased attention, and downward head gaze. Provided verbal cues for erect posture, proximity to RW for safety, looking ahead, and increased step height.  Neuromuscular Re-ed: Patient performed the following lower extremity motor control activities in preparation for gait training: -10 min on NuStep using lower extremities only 20-30 SPM, WL 5 focused on reciprocal motion and lower extremity motor control with a salient task (patient is a  pervious bike rider a baseline), required cues for attention to task and maintaining SPM, tactile cues for controlled extension on R -sit to/from stand 2x5 with B upper extremities without AD with close supervision focused on forward weight shift and erect posture in standing -alternating step-taps on 7" step 2x5 focused on hamstring activatin and B foot clearance of step  Patient in recliner in the room at end of session with breaks locked, chair alarm set, and all needs within reach. MD rounded at end of session.   Therapy Documentation Precautions:  Precautions Precautions: Fall Precaution Comments: R crani Restrictions Weight Bearing Restrictions: No    Therapy/Group: Individual Therapy  Liah Morr L Sotiria Keast PT, DPT  04/13/2021, 12:21 PM

## 2021-04-14 ENCOUNTER — Telehealth: Payer: Self-pay | Admitting: Internal Medicine

## 2021-04-14 ENCOUNTER — Other Ambulatory Visit: Payer: Self-pay | Admitting: Radiation Therapy

## 2021-04-14 LAB — BASIC METABOLIC PANEL
Anion gap: 9 (ref 5–15)
BUN: 8 mg/dL (ref 8–23)
CO2: 28 mmol/L (ref 22–32)
Calcium: 8.8 mg/dL — ABNORMAL LOW (ref 8.9–10.3)
Chloride: 98 mmol/L (ref 98–111)
Creatinine, Ser: 0.7 mg/dL (ref 0.61–1.24)
GFR, Estimated: >60 mL/min (ref >60)
Glucose, Bld: 99 mg/dL (ref 70–99)
Potassium: 4 mmol/L (ref 3.5–5.1)
Sodium: 135 mmol/L (ref 135–145)

## 2021-04-14 LAB — MAGNESIUM: Magnesium: 2 mg/dL (ref 1.7–2.4)

## 2021-04-14 MED ORDER — POTASSIUM CHLORIDE 20 MEQ PO PACK
40.0000 meq | PACK | Freq: Every day | ORAL | Status: DC
  Administered 2021-04-15 – 2021-04-18 (×4): 40 meq via ORAL
  Filled 2021-04-14 (×4): qty 2

## 2021-04-14 NOTE — Progress Notes (Signed)
Staples removed from incision on scalp. Patient tolerated well. Incision intact, no bleeding.

## 2021-04-14 NOTE — Progress Notes (Signed)
Patient ID: Shannon Horton., male   DOB: 1946/10/18, 75 y.o.   MRN: [id number]  Spoke with [place] to confirm discharge date of 6/28. So she can arrange follow up appointments.

## 2021-04-14 NOTE — Progress Notes (Signed)
Speech Language Pathology Weekly Progress and Session Note  Patient Details  Name: Shannon Horton. MRN: [id number] Date of Birth: Aug 17, 1946  Beginning of progress report period: April 07, 2021 End of progress report period: April 14, 2021  Today's Date: 04/14/2021 SLP Individual Time: 0900-1000 SLP Individual Time Calculation (min): 60 min  Short Term Goals: Week 1: SLP Short Term Goal 1 (Week 1): Patient will recall new, functional daily information with Min verbal and visual cues SLP Short Term Goal 1 - Progress (Week 1): Not met SLP Short Term Goal 2 (Week 1): Pt will increase sustained attention for functional tasks to 15 minutes provided min A cues SLP Short Term Goal 2 - Progress (Week 1): Not met SLP Short Term Goal 3 (Week 1): Pt will complete mildly complex problem solving tasks with 75% accuracy and Min A cues SLP Short Term Goal 3 - Progress (Week 1): Not met    New Short Term Goals: Week 2: SLP Short Term Goal 1 (Week 2): Patient will utilize memory aides/strategies for recall of new information with Min A cues. SLP Short Term Goal 2 (Week 2): Pt will increase sustained attention for functional tasks to 10 minute intervals provided Mod A cues SLP Short Term Goal 3 (Week 2): Patient will utilize orientation aides/strategies to orient to time, situation, and location with Min A cues SLP Short Term Goal 4 (Week 2): Patient will complete basic problem solving tasks with 70% accuracy and Mod A cues  Weekly Progress Updates:  Patient has unfortunately not met any short-term goals this reporting period. Patient presented with decline in functional status secondary to onset of PNA. Patient currently requiring mod-to-max assist from a cognitive-linguistic perspective. Continues to demonstrate deficits in orientation, attention, basic problem solving, short-term recall, awareness, and insight into deficits. Patient benefits from a low stim environment, orientation aides, extended  processing time, and frequent verbal reinforcement and re-direction during functional cognitive tasks. BSE was completed and oropharyngeal swallow function deemed WFL with recommendations to continue regular diet and thin liquids. Patient continues to require intermittent supervision due to distractibility. Short-term and long-term goals were revised due to decline in functional status this week. Patient will continue to benefit from skilled SLP treatment to maximize functional independence.  Intensity: Minumum of 1-2 x/day, 30 to 90 minutes Frequency: 3 to 5 out of 7 days Duration/Length of Stay: 04/27/2021 Treatment/Interventions: Cueing hierarchy;Functional tasks;Patient/family education;Internal/external aids;Environmental controls;Therapeutic Activities   Daily Session  Skilled Therapeutic Interventions:  Skilled SLP treatment performed with focus on cogntive goals. SLP facilitated functional short-term recall activity involving memorization of grocery list containing 7 items with use of memory strategies including categorization, written list, and visualization. Patient recalled 2/7 items without cues after 5 minute delay progressing to 7/7 (100% accuracy) with Mod verbal cues (semantic, and first letter). Repeated task after further review of same information and repetition of memory strategies where patient was able to recall 4/7 without cues and 7/7 (100% accuracy) after provided with first letter cues after 5 minute delay. Completed activity with overall Mod support-to-max assist. Patient was able to sustain attention for approximately 3 minute intervals prior to requiring Min verbal re-direction. Patient exhibited limited ability to alternate attention from one topic to the next. Patient was left in chair with alarm activated and needs within reach. Continue with current plan of car.     Pain Pain Assessment Pain Scale: 0-10 Pain Score: 0-No pain  Therapy/Group: Individual  Therapy  Patty Sermons 04/14/2021, 12:45 PM

## 2021-04-14 NOTE — Progress Notes (Signed)
Occupational Therapy Session Note  Patient Details  Name: Shannon Horton. MRN: [id number] Date of Birth: [birth year]  Today's Date: 04/14/2021 OT Individual Time: 1050-1200 OT Individual Time Calculation (min): 70 min    Short Term Goals: Week 1:  OT Short Term Goal 1 (Week 1): Pt iwll don shirt sitting unsupported wiht S for components and sitting balance OT Short Term Goal 1 - Progress (Week 1): Met OT Short Term Goal 2 (Week 1): Pt will maintain dynamic sitting blaance with S and min cuing for midline orientation OT Short Term Goal 2 - Progress (Week 1): Met OT Short Term Goal 3 (Week 1): Pt will transfer to toilet with CGA and no more than MIN VC for RW management/proximity OT Short Term Goal 3 - Progress (Week 1): Progressing toward goal OT Short Term Goal 4 (Week 1): Pt will thread BLE into pants OT Short Term Goal 4 - Progress (Week 1): Met  Skilled Therapeutic Interventions/Progress Updates:     Pt received in recliner with son, Quita Skye, present and no report of pain  ADL:  Pt declines BADL this session but agreeable to shower tomorrow. BP 120/75 and O2 onRA 98%. Pt completes stand pivot transfer with MAX VC for upright posture and pivoting to w/c instead of walking to sink. Pt requires VC for initiation of grooming tasks at sink in standing with significantly flexed posture at sink with multimodal cuing for posture. Pt also requires VC for termination of oral care as pt completes 3x. Education provided to son about decreased cuing to allow pt to process and OT to observe pt performance.   Therapeutic activity Pt completes seated tasks at BITS: memory random sequence up to 5 with no A, alternating 1-A-2-B pattern up to 5-e with no errors then step by step cuing provided to up to 10-J in 4 min 46 sec; and maze test in 1 min 2 sec with 3 errors.   Pt completes standing balance activity focusing on dynamic balance, reaching and L trunk lengthening. Pt reaches for horse shoes on L  and places to high basketball hoop on R with LUE first round and RUE second round with min A for dynamic balance/cuing for knee and hip extension  Pt left at end of session in recliner in new room with exit alarm on, call light in reach and all needs met   Therapy Documentation Precautions:  Precautions Precautions: Fall Precaution Comments: R crani Restrictions Weight Bearing Restrictions: No General:   Vital Signs: Therapy Vitals Temp: 97.6 F (36.4 C) Temp Source: Oral Pulse Rate: (!) 49 Resp: 16 BP: (!) 144/80 Patient Position (if appropriate): Lying Oxygen Therapy SpO2: 97 % O2 Device: Room Air Pain:   ADL: ADL Eating: Set up Where Assessed-Eating: Bed level Grooming: Minimal assistance Where Assessed-Grooming: Standing at sink Upper Body Bathing: Minimal assistance (sitting balance) Where Assessed-Upper Body Bathing: Shower Lower Body Bathing: Minimal assistance Where Assessed-Lower Body Bathing: Shower Upper Body Dressing: Minimal assistance Where Assessed-Upper Body Dressing: Chair Lower Body Dressing: Moderate assistance Where Assessed-Lower Body Dressing: Chair Toileting: Minimal assistance Where Assessed-Toileting: Glass blower/designer: Psychiatric nurse Method: Counselling psychologist: Energy manager: Minimal assistance, Moderate cueing Social research officer, government Method: Heritage manager: Educational psychologist   Exercises:   Other Treatments:     Therapy/Group: Individual Therapy  Tonny Branch 04/14/2021, 6:57 AM

## 2021-04-14 NOTE — Telephone Encounter (Signed)
Scheduled appt per 6/15 inbasket msg. Called pt, no answer. Left msg with appt date and time.

## 2021-04-14 NOTE — Plan of Care (Signed)
Problem: RH Balance Goal: LTG Patient will maintain dynamic standing balance (PT) Description: LTG:  Patient will maintain dynamic standing balance with assistance during mobility activities (PT) Flowsheets (Taken 04/14/2021 0848) LTG: Pt will maintain dynamic standing balance during mobility activities with:: Supervision/Verbal cueing   Problem: Sit to Stand Goal: LTG:  Patient will perform sit to stand with assistance level (PT) Description: LTG:  Patient will perform sit to stand with assistance level (PT) Flowsheets (Taken 04/14/2021 0848) LTG: PT will perform sit to stand in preparation for functional mobility with assistance level: (Downgraded goal due to change in functional staus with PNA.) Contact Guard/Touching assist Note: Downgraded goal due to change in functional staus with PNA.   Problem: RH Bed Mobility Goal: LTG Patient will perform bed mobility with assist (PT) Description: LTG: Patient will perform bed mobility with assistance, with/without cues (PT). Flowsheets (Taken 04/14/2021 0848) LTG: Pt will perform bed mobility with assistance level of: Supervision/Verbal cueing   Problem: RH Bed to Chair Transfers Goal: LTG Patient will perform bed/chair transfers w/assist (PT) Description: LTG: Patient will perform bed to chair transfers with assistance (PT). Flowsheets (Taken 04/14/2021 0848) LTG: Pt will perform Bed to Chair Transfers with assistance level: (Downgraded goal due to change in functional staus with PNA.) Contact Guard/Touching assist Note: Downgraded goal due to change in functional staus with PNA.   Problem: RH Car Transfers Goal: LTG Patient will perform car transfers with assist (PT) Description: LTG: Patient will perform car transfers with assistance (PT). Flowsheets (Taken 04/14/2021 0848) LTG: Pt will perform car transfers with assist:: (Downgraded goal due to change in functional staus with PNA.) Contact Guard/Touching assist Note: Downgraded goal due to  change in functional staus with PNA.   Problem: RH Ambulation Goal: LTG Patient will ambulate in controlled environment (PT) Description: LTG: Patient will ambulate in a controlled environment, # of feet with assistance (PT). Flowsheets (Taken 04/14/2021 0848) LTG: Pt will ambulate in controlled environ  assist needed:: (Downgraded goal due to change in functional staus with PNA.) Contact Guard/Touching assist LTG: Ambulation distance in controlled environment: 300 ft using LRAD. Note: Downgraded goal due to change in functional staus with PNA. Goal: LTG Patient will ambulate in home environment (PT) Description: LTG: Patient will ambulate in home environment, # of feet with assistance (PT). Flowsheets Taken 04/14/2021 0848 LTG: Pt will ambulate in home environ  assist needed:: (Downgraded goal due to change in functional staus with PNA.) Contact Guard/Touching assist Taken 04/07/2021 1258 LTG: Ambulation distance in home environment: 50 ft using LRAD Note: Downgraded goal due to change in functional staus with PNA.   Problem: RH Stairs Goal: LTG Patient will ambulate up and down stairs w/assist (PT) Description: LTG: Patient will ambulate up and down # of stairs with assistance (PT) Flowsheets (Taken 04/14/2021 0848) LTG: Pt will ambulate up/down stairs assist needed:: Contact Guard/Touching assist LTG: Pt will  ambulate up and down number of stairs: (Downgraded goal due to change in functional staus with PNA.) 1 step no rail using LRAD to enter first level of home Note: Downgraded goal due to change in functional staus with PNA.   Problem: RH Awareness Goal: LTG: Patient will demonstrate awareness during functional activites type of (PT) Description: LTG: Patient will demonstrate awareness during functional activites type of (PT) Flowsheets (Taken 04/14/2021 0848) LTG: Patient will demonstrate awareness during functional activites type of (PT): (Downgraded goal due to change in functional  staus with PNA.) Minimal Assistance - Patient > 75% Note: Downgraded goal due to  change in functional staus with PNA.

## 2021-04-14 NOTE — Progress Notes (Addendum)
PROGRESS NOTE   Subjective/Complaints: Feeling better. Breathing still not back to normal. Able to sleep last night  ROS: Patient denies fever, rash, sore throat, blurred vision, nausea, vomiting, diarrhea, cough,   chest pain, joint or back pain, headache, or mood change.   Objective:   No results found. Recent Labs    04/12/21 0600 04/13/21 0558  WBC 6.1 4.6  HGB 12.3* 13.3  HCT 35.1* 38.3*  PLT 166 171   Recent Labs    04/13/21 0558 04/14/21 0510  NA 132* 135  K 3.5 4.0  CL 97* 98  CO2 28 28  GLUCOSE 96 99  BUN 9 8  CREATININE 0.71 0.70  CALCIUM 9.1 8.8*    Intake/Output Summary (Last 24 hours) at 04/14/2021 0914 Last data filed at 04/14/2021 9379 Gross per 24 hour  Intake 440 ml  Output 850 ml  Net -410 ml        Physical Exam: Vital Signs Blood pressure (!) 144/80, pulse (!) 49, temperature 97.6 F (36.4 C), temperature source Oral, resp. rate 16, height 5\' 8"  (1.727 m), SpO2 97 %. Constitutional: No distress . Vital signs reviewed. HEENT: EOMI, oral membranes moist Neck: supple Cardiovascular: RRR without murmur. No JVD    Respiratory/Chest: CTA Bilaterally without wheezes or rales. Normal effort. Still not a lot of air movement, shallow respiration  GI/Abdomen: BS +, non-tender, non-distended Ext: no clubbing, cyanosis, or edema Psych: pleasant and cooperative, more engaging today Skin: scalp incision with staples, some associated scab in hair, loose Neuro: alert, oriented to person, place, why he's here. Follows commands. Normal language and speech. Moving all 4 limbs spontaneously Musculoskeletal: Full ROM, No pain with AROM or PROM in the neck, trunk, or extremities. Posture appropriate      Assessment/Plan: 1. Functional deficits which require 3+ hours per day of interdisciplinary therapy in a comprehensive inpatient rehab setting. Physiatrist is providing close team supervision and 24  hour management of active medical problems listed below. Physiatrist and rehab team continue to assess barriers to discharge/monitor patient progress toward functional and medical goals  Care Tool:  Bathing    Body parts bathed by patient: Right arm, Left arm, Chest, Abdomen, Front perineal area, Right upper leg, Left upper leg, Right lower leg, Left lower leg, Face   Body parts bathed by helper: Buttocks     Bathing assist Assist Level: Minimal Assistance - Patient > 75%     Upper Body Dressing/Undressing Upper body dressing   What is the patient wearing?: Pull over shirt    Upper body assist Assist Level: Minimal Assistance - Patient > 75%    Lower Body Dressing/Undressing Lower body dressing      What is the patient wearing?: Pants     Lower body assist Assist for lower body dressing: Moderate Assistance - Patient 50 - 74%     Toileting Toileting    Toileting assist Assist for toileting: Independent with assistive device Assistive Device Comment: urinal   Transfers Chair/bed transfer  Transfers assist     Chair/bed transfer assist level: Contact Guard/Touching assist Chair/bed transfer assistive device: Programmer, multimedia   Ambulation assist  Assist level: Minimal Assistance - Patient > 75% Assistive device: Walker-rolling Max distance: >100 ft   Walk 10 feet activity   Assist     Assist level: Contact Guard/Touching assist Assistive device: Walker-rolling   Walk 50 feet activity   Assist    Assist level: Minimal Assistance - Patient > 75% Assistive device: Walker-rolling    Walk 150 feet activity   Assist    Assist level: Minimal Assistance - Patient > 75% Assistive device: Walker-rolling    Walk 10 feet on uneven surface  activity   Assist Walk 10 feet on uneven surfaces activity did not occur: Safety/medical concerns (decreased safety/spatial awareness)         Wheelchair     Assist         Wheelchair assist level: Supervision/Verbal cueing Max wheelchair distance: 204 ft    Wheelchair 50 feet with 2 turns activity    Assist        Assist Level: Supervision/Verbal cueing   Wheelchair 150 feet activity     Assist      Assist Level: Supervision/Verbal cueing   Blood pressure (!) 144/80, pulse (!) 49, temperature 97.6 F (36.4 C), temperature source Oral, resp. rate 16, height 5\' 8"  (1.727 m), SpO2 97 %.  Medical Problem List and Plan: 1.  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  Follow-up oncology services. Pathology positive for malignant melanoma             -patient may shower             -ELOS/Goals: 13-20 days, supervision for PT, OT, SLP  -Continue CIR therapies including PT, OT, and SLP  2.  Antithrombotics: -DVT/anticoagulation: Subcutaneous heparin.             -antiplatelet therapy: N/A 3. Pain Management: N/A 4. Mood: Xanax 0.25 mg 3 times daily as needed             -antipsychotic agents: N/A 5. Neuropsych: This patient is capable of making decisions on his own behalf. 6. Skin/Wound Care: Routine skin checks             -keep scalp clean and dry--stable in appearance today 7. Fluids/Electrolytes/Nutrition: encourage PO   6/15 sodium up to 135   -dc IVF, continue 1500cc FR  -protein supplement for low albumin 8.  Hypertension.  Norvasc 5 mg daily, Avapro 300 mg daily, HCTZ 25 mg daily.  Monitor with increased mobility             -bp controlled 6/10---avoid hypotension. May need to back off meds a bit  6/11- D/c Norvasc for now, BP is 80s/90s laying and worse sitting up- using TEDs, ACE wraps and Abd binder.   6/12- BP a little better- still soft- 100s/50s-  9.  Seizure prophylaxis.  Keppra 500 mg twice daily 10.  Glaucoma.  Followed by Dr. Satira Sark.  Continue eyedrops. 11.  History of asthma.  Continue inhalers as  directed.  Check oxygen saturations every shift 12.  Constipation.               -miralax bid             -senna-s at bedtime  -sorbitol 6/8 with results  -magnesium gluconate 250mg  ordered HS  -IV mag 1mg  on 6/14 since level is 1.9- can also help with HTN 13. Altered mental status  -improving with rx of pneumonia--still not  quite back to where he was 14. Pneumonia- HCAP  6/15 wbc's improved, clinically better   -completed 14 doses of cefepime   -observe off abx for now   -encouraged OOB, IS, etc 15. Insomnia: magnesium gluconate 250mg  ordered HS 16. Hypokalemia: improved. Reduce to 65meq kdur daily for now. Recheck friday 17. Vitamin D deficiency: ergocalciferol 50,000U once per week x7 weeks ordered    LOS: 8 days A FACE TO FACE EVALUATION WAS PERFORMED  [name] 04/14/2021, 9:14 AM

## 2021-04-14 NOTE — Progress Notes (Signed)
Physical Therapy Weekly Progress Note  Patient Details  Name: Shannon Horton. MRN: [id number] Date of Birth: [birth year]  Beginning of progress report period: April 07, 2021 End of progress report period: April 14, 2021  Today's Date: 04/14/2021 PT Individual Time: 0810-0900 and 1300-1340 PT Individual Time Calculation (min): 40 min   Patient has met 0 of 3 short term goals. Patient with decreased functional status this week due to onset of PNA with electrolyte imbalance. Patient currently performing all mobility at min A level, ambulating >200 ft using RW with increased intermittent L lean in sitting and standing. Was at min A-CGA ambulating >300 ft on evaluation on 6/8.  Patient continues to demonstrate the following deficits muscle weakness and muscle joint tightness, decreased cardiorespiratoy endurance, decreased coordination and decreased motor planning, decreased attention, decreased awareness, decreased problem solving, decreased safety awareness, and decreased memory, and decreased sitting balance, decreased standing balance, decreased postural control, hemiplegia, and decreased balance strategies and therefore will continue to benefit from skilled PT intervention to increase functional independence with mobility.  Patient not progressing toward long term goals.  See goal revision..  Plan of care revisions: downgraded goals to CGA overall for safety due to decline in functional status this week.  PT Short Term Goals Week 1:  PT Short Term Goal 1 (Week 1): Patient will perform basic transfers with supervison consistently. PT Short Term Goal 1 - Progress (Week 1): Progressing toward goal PT Short Term Goal 2 (Week 1): Patient will ambulate >400 ft with CGA using LRAD. PT Short Term Goal 2 - Progress (Week 1): Progressing toward goal PT Short Term Goal 3 (Week 1): Patient will improve by MCID on the Berg Balance Test PT Short Term Goal 3 - Progress (Week 1): Discontinued (comment)  (discontiued due to change in patient funcion this week) Week 2:  PT Short Term Goal 1 (Week 2): Patient will perform sit to from stand with CGA consistently using LRAD. PT Short Term Goal 2 (Week 2): Patinet will perform sitting balance with supervision consistently. PT Short Term Goal 3 (Week 2): Patient with ambulating >200 ft with CGA with only 2 cues for safe proximity to RW. PT Short Term Goal 4 (Week 2): Patient will perform 1 step using LRAD without rails with min A.  Skilled Therapeutic Interventions/Progress Updates:     Session 1: Patient in bed asleep upon PT arrival. Patient easily aroused to verbal stimulation and agreeable to initiate early PT session. Patient denied pain during session.  Vitals: BP 138/78, HR 48, SPO2 97%  Therapeutic Activity: Bed Mobility: Patient performed supine to/from sit with min A for trunk control. Provided verbal cues for initiation and forward trunk motion to reduce posterior lean. Patient sat EOB with min A for sitting balance due to L lean to take morning medications from RN. Threaded lower extremities through pants with total A with min A for balance in sitting. Donned B slippers with max A for energy/time management, as patient requested to go to the bathroom. Transfers: Patient performed sit to/from stand from bed, toilet, and recliner with min A for forward weight shift. Provided verbal cues for scooting forward, forward weight shift and hand placement. Patient was continent of bowl and continent/incontinent of urin in brief during toileting. Required total A for peri-card lower body clothing management.   Gait Training:  Patient ambulated 12 and 20 feet in the room to simulate home ambulation using RW with CGA-min A for balance and blue tap on front bar  of RW to encouraged close proximity with RW throughout. Ambulated with decreased gait speed, decreased step length and height, increased B hip and knee flexion in stance, forward trunk lean, and  downward head gaze. Provided verbal cues for erect posture, proximity to RW x3-4 each trial, and increased step height for improved foot clearance L>R.  Set patient up to eat breakfast sitting in the recliner. Patient only ate his bacon and drank a juice, reported he did not want his egg sandwich.   Reviewed patient's current functional level with mobility, changes since admission, and set new weekly goals and discussed LTGs and level of assist at d/c while patient ate.   Patient in recliner in the room at end of session with breaks locked, chair alarm set, and all needs within reach.   Session 2: Patient in bed upon PT arrival at 1415. Patient declined working with PT, stating "I just don't feel like it." Patient reported that he was waiting for his family, but not sure if they were coming. Offered transferring OOB to visit with family, going outside to great family, toileting, changing into his personal clothing, and ADLs, however patient declined all offers. Offered to return at 1500 min to see if family has arrived, patient in agreement.   Returned at 1500, patient in the bed upon PT arrival. Patient initially perseverative on showing PT a picture of his granddaughter. Patient agreeable to PT session to transfer OOB when he received a call that his son and granddaughter had arrived. Patient denied pain during session.  Therapeutic Activity: Bed Mobility: Patient performed supine to/from sit with min A for trunk control. Provided verbal cues for initiation and forward trunk motion to reduce posterior lean. Patient sat EOB with min A for sitting balance due to L lean to take morning medications from RN. Threaded lower extremities through pants with total A with min A for balance in sitting. Transfers: Patient performed sit to/from stand x1 with RW with min A to stand and mod A +2 to sit due to forward LOB while backing up to the w/c. Provided verbal cues for forward weight shift to stand, erect  posture in standing, hand placement, and safety awareness.  Gait Training:  Patient ambulated 20 feet using RW with min A. Ambulated as above.   Therapeutic Exercise: Patient performed the following exercises with verbal and tactile cues for proper technique. -10 min on NuStep using lower extremities only 20-30 SPM, WL 6 focused on reciprocal motion and lower extremity motor control with a salient task (patient is a pervious bike rider a baseline), required cues for attention to task and maintaining SPM, tactile cues for controlled extension on R  Patient in w/c with his son and granddaughter in the room at end of session with breaks locked, seat belt alarm set, and all needs within reach.   Therapy Documentation Precautions:  Precautions Precautions: Fall Precaution Comments: R crani Restrictions Weight Bearing Restrictions: No   Therapy/Group: Individual Therapy  Kaydyn Chism L Walida Cajas PT, DPT  04/14/2021, 4:26 PM

## 2021-04-15 NOTE — Progress Notes (Signed)
PROGRESS NOTE   Subjective/Complaints: More up beat today. I asked him about yesterday and not wanting to do therapy, potential depression.  He said that he was distressed about taking staples out yesterday, and that today he was feeling better.   ROS: Patient denies fever, rash, sore throat, blurred vision, nausea, vomiting, diarrhea, cough, shortness of breath or chest pain, joint or back pain, headache, or mood change.   Objective:   No results found. Recent Labs    04/13/21 0558  WBC 4.6  HGB 13.3  HCT 38.3*  PLT 171   Recent Labs    04/13/21 0558 04/14/21 0510  NA 132* 135  K 3.5 4.0  CL 97* 98  CO2 28 28  GLUCOSE 96 99  BUN 9 8  CREATININE 0.71 0.70  CALCIUM 9.1 8.8*    Intake/Output Summary (Last 24 hours) at 04/15/2021 1110 Last data filed at 04/15/2021 0851 Gross per 24 hour  Intake 478 ml  Output 300 ml  Net 178 ml        Physical Exam: Vital Signs Blood pressure (!) 153/79, pulse (!) 55, temperature 98.6 F (37 C), temperature source Oral, resp. rate 16, height 5\' 8"  (1.727 m), SpO2 99 %. Constitutional: No distress . Vital signs reviewed. HEENT: EOMI, oral membranes moist Neck: supple Cardiovascular: RRR without murmur. No JVD    Respiratory/Chest: CTA Bilaterally without wheezes or rales. Normal effort    GI/Abdomen: BS +, non-tender, non-distended Ext: no clubbing, cyanosis, or edema Psych: pleasant and cooperative Skin: scalp incision with staples, some associated scab in hair, loose Neuro: alert, oriented to person, place, why he's here. Follows commands. Normal language and speech. Moving all 4 limbs spontaneously Musculoskeletal: Full ROM, No pain with AROM or PROM in the neck, trunk, or extremities. Posture appropriate      Assessment/Plan: 1. Functional deficits which require 3+ hours per day of interdisciplinary therapy in a comprehensive inpatient rehab setting. Physiatrist is  providing close team supervision and 24 hour management of active medical problems listed below. Physiatrist and rehab team continue to assess barriers to discharge/monitor patient progress toward functional and medical goals  Care Tool:  Bathing    Body parts bathed by patient: Right arm, Left arm, Chest, Abdomen, Front perineal area, Right upper leg, Left upper leg, Right lower leg, Left lower leg, Face   Body parts bathed by helper: Buttocks     Bathing assist Assist Level: Minimal Assistance - Patient > 75%     Upper Body Dressing/Undressing Upper body dressing   What is the patient wearing?: Pull over shirt    Upper body assist Assist Level: Minimal Assistance - Patient > 75%    Lower Body Dressing/Undressing Lower body dressing      What is the patient wearing?: Pants     Lower body assist Assist for lower body dressing: Moderate Assistance - Patient 50 - 74%     Toileting Toileting    Toileting assist Assist for toileting: Independent with assistive device Assistive Device Comment: urinal   Transfers Chair/bed transfer  Transfers assist     Chair/bed transfer assist level: Contact Guard/Touching assist Chair/bed transfer assistive device: Environmental consultant  Ambulation   Ambulation assist      Assist level: Minimal Assistance - Patient > 75% Assistive device: Walker-rolling Max distance: >100 ft   Walk 10 feet activity   Assist     Assist level: Contact Guard/Touching assist Assistive device: Walker-rolling   Walk 50 feet activity   Assist    Assist level: Minimal Assistance - Patient > 75% Assistive device: Walker-rolling    Walk 150 feet activity   Assist    Assist level: Minimal Assistance - Patient > 75% Assistive device: Walker-rolling    Walk 10 feet on uneven surface  activity   Assist Walk 10 feet on uneven surfaces activity did not occur: Safety/medical concerns (decreased safety/spatial awareness)          Wheelchair     Assist        Wheelchair assist level: Supervision/Verbal cueing Max wheelchair distance: 204 ft    Wheelchair 50 feet with 2 turns activity    Assist        Assist Level: Supervision/Verbal cueing   Wheelchair 150 feet activity     Assist      Assist Level: Supervision/Verbal cueing   Blood pressure (!) 153/79, pulse (!) 55, temperature 98.6 F (37 C), temperature source Oral, resp. rate 16, height 5\' 8"  (1.727 m), SpO2 99 %.  Medical Problem List and Plan: 1.  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  Follow-up oncology services. Pathology positive for malignant melanoma             -patient may shower             -ELOS/Goals: 13-20 days, supervision for PT, OT, SLP  -Continue CIR therapies including PT, OT, and SLP   2.  Antithrombotics: -DVT/anticoagulation: Subcutaneous heparin.             -antiplatelet therapy: N/A 3. Pain Management: N/A 4. Mood: Xanax 0.25 mg 3 times daily as needed             -antipsychotic agents: N/A 5. Neuropsych: This patient is capable of making decisions on his own behalf. 6. Skin/Wound Care: Routine skin checks             -keep scalp clean and dry--stable in appearance today 7. Fluids/Electrolytes/Nutrition: encourage PO   6/16 sodium up to 135   -off IVF, continue 1500cc FR  -protein supplement for low albumin  -recheck labs 6/17 8.  Hypertension.  Norvasc 5 mg daily, Avapro 300 mg daily, HCTZ 25 mg daily.  Monitor with increased mobility             -bp controlled 6/10---avoid hypotension. May need to back off meds a bit  6/11- D/c Norvasc for now, BP is 80s/90s laying and worse sitting up- using TEDs, ACE wraps and Abd binder.   6/12- BP a little better- still soft- 100s/50s-  9.  Seizure prophylaxis.  Keppra 500 mg twice daily 10.  Glaucoma.  Followed by Dr. Satira Sark.  Continue  eyedrops. 11.  History of asthma.  Continue inhalers as directed.  Check oxygen saturations every shift 12.  Constipation.               -miralax bid             -senna-s at bedtime  -sorbitol 6/8 with results  -magnesium gluconate 250mg  ordered HS  -no bm since 6/14---if none today, will give rescue meds  tomorrow 13. Altered mental status  -improving with rx of pneumonia--still not quite back to where he was 14. Pneumonia- HCAP  6/16 wbc's improved, clinically better   -completed 14 doses of cefepime   -observe off abx for now   -encouraged OOB, IS, etc   -check cbc tomorrow 15. Insomnia: magnesium gluconate 250mg  ordered HS 16. Hypokalemia: improved. Reduced to 42meq kdur daily for now. Recheck friday 17. Vitamin D deficiency: ergocalciferol 50,000U once per week x7 weeks ordered    LOS: 9 days A FACE TO FACE EVALUATION WAS PERFORMED  [name] 04/15/2021, 11:10 AM

## 2021-04-15 NOTE — Progress Notes (Signed)
Visited the patient to remove his staples.  They were removed yesterday.  His incision is clean dry and intact, healing appropriately.  Cleared from my standpoint to shower and wash his hair.  Continue rehab, will follow-up in approximately 1 month    Elwin Sleight, DO Neurosurgeon

## 2021-04-15 NOTE — Progress Notes (Signed)
Occupational Therapy TBI Note  Patient Details  Name: Shannon Horton. MRN: [id number] Date of Birth: 1946-03-11  Today's Date: 04/15/2021 OT Individual Time: 0900-0930 OT Individual Time Calculation (min): 30 min   Today's Date: 04/15/2021 OT Individual Time: 1400-1500 OT Individual Time Calculation (min): 60 min   Short Term Goals: Week 1:  OT Short Term Goal 1 (Week 1): Pt iwll don shirt sitting unsupported wiht S for components and sitting balance OT Short Term Goal 1 - Progress (Week 1): Met OT Short Term Goal 2 (Week 1): Pt will maintain dynamic sitting blaance with S and min cuing for midline orientation OT Short Term Goal 2 - Progress (Week 1): Met OT Short Term Goal 3 (Week 1): Pt will transfer to toilet with CGA and no more than MIN VC for RW management/proximity OT Short Term Goal 3 - Progress (Week 1): Progressing toward goal OT Short Term Goal 4 (Week 1): Pt will thread BLE into pants OT Short Term Goal 4 - Progress (Week 1): Met  Skilled Therapeutic Interventions/Progress Updates:     Pt received in bed with 0 out of 10 pain  ADL:  Pt completes LB dressing with MAX A to don pants and significant L lean during threading BLE  Pt completes toileting with MAX A for components of toileting Pt completes toileting transfer with MOD A stand pivot with grab bar  MAX VC throughotu for upright posture in sitting/standing during tasks and seated on BSC SPT to recliner as stated above  Pt left at end of session in recliner with exit alarm on, call light in reach and all needs met   Session 2: Pt received in bed agreeale to OT, requiring encouragement to participate in shower. No pain during session ADL:  Pt completes bathing with MOD A for sitting balance statically or dynamically while washing with no A for actual bathing but constant cuing for midline orientation. Pt requires use of BUE on grab bar and straightening arms to maintain midline while OT washes hair Pt  completes UB dressing with MIN A for sitting balance Pt completes LB dressing with MIN A during threading Les and MIN A sit to stand while OT advances pants past sink. Mirror to adie midline orientation/posture Pt completes footwear with total A Pt completes shower/Tub transfer with MIN A stand pivot transfer  Pt left at end of session in bed with exit alarm on, call light in reach and all needs met    Therapy Documentation Precautions:  Precautions Precautions: Fall Precaution Comments: R crani Restrictions Weight Bearing Restrictions: No General:   Vital Signs: Therapy Vitals Temp: 98.6 F (37 C) Temp Source: Oral Pulse Rate: (!) 55 Resp: 16 BP: (!) 153/79 Patient Position (if appropriate): Lying Oxygen Therapy SpO2: 99 % Pain:   Agitated Behavior Scale: TBI      ADL: ADL Eating: Set up Where Assessed-Eating: Bed level Grooming: Minimal assistance Where Assessed-Grooming: Standing at sink Upper Body Bathing: Minimal assistance (sitting balance) Where Assessed-Upper Body Bathing: Shower Lower Body Bathing: Minimal assistance Where Assessed-Lower Body Bathing: Shower Upper Body Dressing: Minimal assistance Where Assessed-Upper Body Dressing: Chair Lower Body Dressing: Moderate assistance Where Assessed-Lower Body Dressing: Chair Toileting: Minimal assistance Where Assessed-Toileting: Glass blower/designer: Psychiatric nurse Method: Counselling psychologist: Energy manager: Minimal assistance, Moderate cueing Social research officer, government Method: Heritage manager: Educational psychologist   Exercises:   Other Treatments:  Therapy/Group: Individual Therapy  Tonny Branch 04/15/2021, 6:49 AM

## 2021-04-15 NOTE — Progress Notes (Signed)
Speech Language Pathology Daily Session Note  Patient Details  Name: Shannon H[place]. MRN: [id number] Date of Birth: [birth year]  Today's Date: 04/15/2021 SLP Individual Time: 0900-1000 SLP Individual Time Calculation (min): 60 min  Short Term Goals: Week 2: SLP Short Term Goal 1 (Week 2): Patient will utilize memory aides/strategies for recall of new information with Min A cues. SLP Short Term Goal 2 (Week 2): Pt will increase sustained attention for functional tasks to 10 minute intervals provided Mod A cues SLP Short Term Goal 3 (Week 2): Patient will utilize orientation aides/strategies to orient to time, situation, and location with Min A cues SLP Short Term Goal 4 (Week 2): Patient will complete basic problem solving tasks with 70% accuracy and Mod A cues  Skilled Therapeutic Interventions: Skilled SLP treatment performed with focus on cognitive goals. Patient was oriented to situation and location without cues. Required visual analog clock (including date and time) to orient to time. SLP facilitated counting money task with max verbal, verbal, and tactile cueing to arouse, as sleepiness impacted participation and accuracy. Transferred patient to wheelchair and to therapy gym to increase stimulation using BITS technology which improved arousal and participation. Patient completed 3 word recall and basic progressive sequencing task. Achieved selective attention for 3 minute intervals with 80% accuracy and mod verbal cues within mildly distracting environment (quiet background conversation). Patient benefited from short breaks (~2 minutes) between tasks. Patient was left in wheelchair with alarm activated and needs within reach. Continue current plan of care.   Pain Pain Assessment Pain Scale: 0-10 Pain Score: 0-No pain  Therapy/Group: Individual Therapy  Patty Sermons 04/15/2021, 12:11 PM

## 2021-04-15 NOTE — Progress Notes (Signed)
Physical Therapy Session Note  Patient Details  Name: Shannon H[place]. MRN: [id number] Date of Birth: 1945/12/24  Today's Date: 04/15/2021 PT Individual Time: 1100-1155 PT Individual Time Calculation (min): 55 min   Short Term Goals: Week 2:  PT Short Term Goal 1 (Week 2): Patient will perform sit to from stand with CGA consistently using LRAD. PT Short Term Goal 2 (Week 2): Patinet will perform sitting balance with supervision consistently. PT Short Term Goal 3 (Week 2): Patient with ambulating >200 ft with CGA with only 2 cues for safe proximity to RW. PT Short Term Goal 4 (Week 2): Patient will perform 1 step using LRAD without rails with min A.  Skilled Therapeutic Interventions/Progress Updates:     Patient in w/c in the room upon PT arrival. Patient alert and agreeable to PT session. Patient denied pain during session. Patient did not want to start with gait training or balance activities, however agreeable to initiating session on the NuStep.   Noted patient to be very internally distracted with flat affect. Offered to have patient participate in [place] group, patient declined, but wanted to be with the other patient's and listen to the music during session.   Therapeutic Activity: Transfers: Patient performed sit to/from stand x3 and stand pivot x1 with CGA with 1 trial requiring 2 attempts due to decreased forward weight shift. Provided verbal cues for hand placement, forward weight shift, and controlled descent x1, patient with poor recall of cues on second and third trial even with prompting for what to do with his hands.  Gait Training:  Patient ambulated 28 feet, 46 feet, and 10 feet with min progressing to mod A due to increased L lean and forward trunk flexion, flexed to 90 degrees on last trial. Ambulated with decreased gait speed, decreased step length and height with more shuffling gait today, increased B hip and knee flexion in stance L>R, forward trunk lean, and downward  head gaze. Provided verbal, tactile and facilitation cues for increased hip/trunk extension, proximity to RW for safety, and facilitation of AD for turns and backing up to the chair. Noted poor recall of technique and cues from previous sessions.  Neuromuscular Re-ed: Patient performed the following motor control activities: -NuStep using lower extremities x6 min and B upper and lower extremities x8 min, cued patient to maintain 20-30 SPM, WL 5 focused on reciprocal motion and lower extremity motor control with a salient task (patient is a previous bike rider at baseline), required cues every 1-2 min for sustained attention to task in increased environmental stimulation and maintaining SPM, and tactile cues for controlled extension on R  Patient in recliner in the room at end of session with breaks locked, chair alarm set, and all needs within reach.   Therapy Documentation Precautions:  Precautions Precautions: Fall Precaution Comments: R crani Restrictions Weight Bearing Restrictions: No    Therapy/Group: Individual Therapy  Jyair Kiraly L Kazimierz Springborn PT, DPT  04/15/2021, 7:37 PM

## 2021-04-16 DIAGNOSIS — F0631 Mood disorder due to known physiological condition with depressive features: Secondary | ICD-10-CM

## 2021-04-16 LAB — BASIC METABOLIC PANEL
Anion gap: 12 (ref 5–15)
BUN: 6 mg/dL — ABNORMAL LOW (ref 8–23)
CO2: 26 mmol/L (ref 22–32)
Calcium: 9.6 mg/dL (ref 8.9–10.3)
Chloride: 92 mmol/L — ABNORMAL LOW (ref 98–111)
Creatinine, Ser: 0.55 mg/dL — ABNORMAL LOW (ref 0.61–1.24)
GFR, Estimated: >60 mL/min (ref >60)
Glucose, Bld: 116 mg/dL — ABNORMAL HIGH (ref 70–99)
Potassium: 3.7 mmol/L (ref 3.5–5.1)
Sodium: 130 mmol/L — ABNORMAL LOW (ref 135–145)

## 2021-04-16 LAB — CBC
HCT: 40.8 % (ref 39.0–52.0)
Hemoglobin: 14.3 g/dL (ref 13.0–17.0)
MCH: 31.8 pg (ref 26.0–34.0)
MCHC: 35 g/dL (ref 30.0–36.0)
MCV: 90.7 fL (ref 80.0–100.0)
Platelets: 201 10*3/uL (ref 150–400)
RBC: 4.5 MIL/uL (ref 4.22–5.81)
RDW: 12 % (ref 11.5–15.5)
WBC: 4.8 10*3/uL (ref 4.0–10.5)
nRBC: 0 % (ref 0.0–0.2)

## 2021-04-16 MED ORDER — MIRTAZAPINE 15 MG PO TABS
7.5000 mg | ORAL_TABLET | Freq: Every day | ORAL | Status: DC
  Administered 2021-04-16 – 2021-04-17 (×2): 7.5 mg via ORAL
  Filled 2021-04-16 (×2): qty 1

## 2021-04-16 MED ORDER — AMLODIPINE BESYLATE 5 MG PO TABS
5.0000 mg | ORAL_TABLET | Freq: Every day | ORAL | Status: DC
  Administered 2021-04-16 – 2021-04-17 (×2): 5 mg via ORAL
  Filled 2021-04-16 (×2): qty 1

## 2021-04-16 NOTE — Consult Note (Signed)
Neuropsychological Consultation   Patient:   Shannon Horton.   DOB:   Jun 27, 1946  MR Number:  [id number]  Location:  [facility] [id number] [place] [address] Dept: [place]: [phone]           Date of Service:   04/16/2021  Start Time:   9 AM End Time:   10 AM  Provider/Observer:  Ilean Skill, Psy.D.       Clinical Neuropsychologist       Billing Code/Service: 37902  Chief Complaint:    Shannon Horton. 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  This was done in 2011.  Patient with negative nodal biopsy.  Patient had been using an assistive device for ambulation but is reported to have had multiple falls over the past 6 weeks.  Prior to that he was independent and active.  Patient presented on 03/25/2021 with persistent headache, balance deficits, left hemiparesis as well as shuffling gait.  MRI of brain showed a 3.8 cm enhancing and hemorrhagic mass in the right basal ganglia/frontal lobe region with a 5 mm left midline shift.  CT of chest and abdomen was notable for left upper pulmonary nodule, left hilar adenopathy right upper lobe pulmonary nodule left lower quadrant mass arising from the right adrenal gland, hepatic lesions.  CT-guided biopsy of adrenal mass on 5/31 was positive for malignancy.  Patient underwent right craniotomy resection of tumor 03/31/2021.  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  Patient has been maintained on Keppra for seizure prophylaxis.  Due to decreased functional mobility/left hemiparesis patient was admitted to the CIR program for therapeutic interventions to regain functional mobility.  Reason for Service:  Patient was referred for neuropsychological consultation due to ongoing difficulties including metastatic cancer as well  as the appearance of depressive symptomatology.  Below is the HPI for the current admission.  HPI: Shannon Horton is a 75 year old right-handed male history of glaucoma followed by Dr. Satira Sark, hypertension, tobacco use, remote  melanoma x2 removed from his right calf and left flank 2011 with negative nodal biopsy.  Per chart review patient lives with spouse.  Two-level home bed and bath main level with one-step to entry.  Independent with assistive device and reported multiple falls over the past 6 weeks.  Prior to 6 weeks ago patient independent and active.  Presented 03/25/2021 with persistent headaches balance deficits x6 weeks, left hemiparesis as well as shuffling gait.  MRI of the brain showed a 3.8 cm enhancing and hemorrhagic mass in the right basal ganglia/frontal lobe region with a 5 mm left midline shift.  CT of the chest and abdomen was notable for left upper pulmonary nodule, left hilar adenopathy right upper lobe pulmonary nodule left lower quadrant mass arising from the right adrenal gland, hepatic lesions.  CT-guided biopsy of the adrenal mass 03/30/2021 positive for malignancy.  Patient underwent right craniotomy resection of tumor 03/31/2021 per Dr. Reatha Armour.  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  Brain surgical tissue pathology remains pending with follow-up per oncology services Dr. Lindi Adie as well as Dr. Alen Blew..  Decadron protocol as indicated.  Maintained on Keppra for seizure prophylaxis.  Patient was cleared to begin subcutaneous heparin for DVT prophylaxis 04/02/2021.  Tolerating a regular consistency diet.  Therapy evaluations completed due to patient decreased functional  mobility/left hemiparesis was admitted for a comprehensive rehab program.  Current Status:  The patient was on the phone with his son discussing his upcoming anniversary and the plans that the son is making for he and his wife's anniversary after he is discharged from the unit.  Patient was  clearly having issues with some arousal noticed with significant somnolence at times.  The patient displayed slowed verbal responses but did show good mental status as far as awareness of situation and orientation.  The patient was sitting upright in his bed during the interaction today and there were times when he became somnolent but was able to arouse and reorient.  He attributed his closing his eyes to recently having drops put in his eyes for his glaucoma but it was clear the patient was somnolent at times.  We had a long discussion regarding the patient's coping with his metastatic cancer and now the significant impact on motor functioning and mobility.  The patient acknowledged some degree of depression about his current status and about what he expects to happen going forward as far as the metastatic cancer.  The patient was aware of pertinent medical information in general but was not able to provide specific details.  He did asked questions about his pneumonia specifically without prompting.  The patient reports that he is feeling like he is benefiting from therapeutic interventions with PT and OT and is gaining some increased functionality and strength.  He reports that his depression and frustration are likely having some impact on his motivation and effort levels but he reports that he does remain motivated to be able to get back home for events such as his upcoming anniversary party.  The patient's family is very engaged and he has good support network at home which she is quite thankful for.  The patient denies significant past history of depressive symptomatology and his depressive symptoms are likely to be very appropriate and reactive to his current significant medical issues and functional deterioration.  Behavioral Observation: [name].  presents as a 75 y.o.-year-old Right Caucasian Male who appeared his stated age. his dress was Appropriate and he was Well Groomed and his manners  were Appropriate to the situation.  his participation was indicative of Drowsy behaviors.  There were physical disabilities noted.  he displayed an appropriate level of cooperation and motivation.     Interactions:    Active Appropriate and Drowsy  Attention:   abnormal and attention span appeared shorter than expected for age  Memory:   abnormal; remote memory intact, recent memory impaired  Visuo-spatial:  not examined  Speech (Volume):  low  Speech:   normal; slowed verbal responses  Thought Process:  Coherent and Relevant  Though Content:  WNL; not suicidal and not homicidal the patient denies any suicidal ideation but does acknowledge his frustration with his deteriorating physical status.  Orientation:   person, place, and situation  Judgment:   Fair  Planning:   Poor  Affect:    Depressed  Mood:    Dysphoric  Insight:   Fair  Intelligence:   high  Medical History:   Past Medical History:  Diagnosis Date   Allergic rhinitis    gets shots per Dr. Velora Heckler   Allergy    sees Dr. Harold Hedge    Asthma    Glaucoma    sees Dr. 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  Family Med/Psych History:  Family History  Problem Relation Age of Onset    Hypertension Other    Colon polyps Mother    Hypertension Mother    Diabetes Father    Liver disease Maternal Uncle    Cancer Brother        throat and lung (smoker)   Colon cancer Neg Hx    Stomach cancer Neg Hx    Esophageal cancer Neg Hx    Pancreatic cancer Neg Hx     Impression/DX:  Dailyn Kempner. 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  This was done in 2011.  Patient with negative nodal biopsy.  Patient had been using an assistive device for ambulation but is reported to have had multiple falls over the past 6 weeks.  Prior to that he was independent and active.  Patient presented on 03/25/2021 with persistent headache, balance deficits, left hemiparesis as well as shuffling gait.  MRI of brain showed a 3.8 cm enhancing and hemorrhagic mass in the right basal ganglia/frontal lobe region with a 5 mm left midline shift.  CT of chest and abdomen was notable for left upper pulmonary nodule, left hilar adenopathy right upper lobe pulmonary nodule left lower quadrant mass arising from the right adrenal gland, hepatic lesions.  CT-guided biopsy of adrenal mass on 5/31 was positive for malignancy.  Patient underwent right craniotomy resection of tumor 03/31/2021.  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  Patient has been maintained on Keppra for seizure prophylaxis.  Due to decreased functional mobility/left hemiparesis patient was admitted to the CIR program for therapeutic interventions to regain functional mobility.  The patient was on the phone with his son discussing his upcoming anniversary and the plans that the son is making for he and his wife's anniversary after he is discharged from the unit.  Patient was clearly having issues with some arousal noticed with significant somnolence at times.  The patient displayed slowed verbal responses but did show good mental status as  far as awareness of situation and orientation.  The patient was sitting upright in his bed during the interaction today and there were times when he became somnolent but was able to arouse and reorient.  He attributed his closing his eyes to recently having drops put in his eyes for his glaucoma but it was clear the patient was somnolent at times.  We had a long discussion regarding the patient's coping with his metastatic cancer and now the significant impact on motor functioning and mobility.  The patient acknowledged some degree of depression about his current status and about what he expects to happen going forward as far as the metastatic cancer.  The patient was aware of  pertinent medical information in general but was not able to provide specific details.  He did asked questions about his pneumonia specifically without prompting.  The patient reports that he is feeling like he is benefiting from therapeutic interventions with PT and OT and is gaining some increased functionality and strength.  He reports that his depression and frustration are likely having some impact on his motivation and effort levels but he reports that he does remain motivated to be able to get back home for events such as his upcoming anniversary party.  The patient's family is very engaged and he has good support network at home which she is quite thankful for.  The patient denies significant past history of depressive symptomatology and his depressive symptoms are likely to be very appropriate and reactive to his current significant medical issues and functional deterioration.  Disposition/Plan:  Today we worked on coping and adjustment issues and reviewed any questions that the patient had.  We will need to keep an eye on his level of somnolence and depressive symptomatology.  The patient reports that his level of depression is not keeping him from being able to participate but is affecting the vigor with which he is engaging in  PT/OT and the way he is putting effort towards planning post discharge.  Diagnosis:    SOB (shortness of breath) - Plan: DG Chest 2 View, DG Chest 2 View, CANCELED: DG Chest 2 View, CANCELED: DG Chest 2 View  Pneumonia - Plan: DG Chest 2 View, DG Chest 2 View         Electronically Signed   _______________________ Ilean Skill, Psy.D. Clinical Neuropsychologist

## 2021-04-16 NOTE — Progress Notes (Signed)
Physical Therapy Session Note  Patient Details  Name: Shannon Horton. MRN: [id number] Date of Birth: 02/07/1946  Today's Date: 04/16/2021 PT Individual Time:802-859   57 min   Short Term Goals:  Week 2:  PT Short Term Goal 1 (Week 2): Patient will perform sit to from stand with CGA consistently using LRAD. PT Short Term Goal 2 (Week 2): Patinet will perform sitting balance with supervision consistently. PT Short Term Goal 3 (Week 2): Patient with ambulating >200 ft with CGA with only 2 cues for safe proximity to RW. PT Short Term Goal 4 (Week 2): Patient will perform 1 step using LRAD without rails with min A.   Skilled Therapeutic Interventions/Progress Updates:   Pt received supine in bed and agreeable to PT. PT assisted pt to don knee high Ted hose.  Supine>sit transfer with supervision assist through long sitting with increased time. Donning pants and shoes from EOB with max assist for time management. Sit>stand with min assist to pull pants to waist with UE support on RW. Stand pivot transfer to [facility] with min-mod assist and mod-max facilitation for erect posture and AD. Pt transported to rehab gym in [facility].   Gait training with min-mod assist 2x46f with mod-max cues and facilitation to improve trunk posture, increass step length, and keep BLE within BOS of RW. Noted improvement in AD management and step length with instruction with 25% retention following each instruction.   PT instructed pt in static balance training while engaged in pipe tree puzzle to complete low difficulty cross. With max multimodal instruction to improve attention to task, increased erect posture. Pt able to complete puzzle and dissemble cross. Picking playing cards off Board and handing to therapist on the L. Mod assist to prevent posterior LOB and increasing lateral lean with fatigue.   Pt returned to room and performed stand pivot transfer to bed with min A and RW improved posture with this transfer. Sit>supine  completed with supervision assist, and left supine in bed with call bell in reach and all needs met.          Therapy Documentation Precautions:  Precautions Precautions: Fall Precaution Comments: R crani Restrictions Weight Bearing Restrictions: No  Vital Signs: Therapy Vitals Temp: 98.4 F (36.9 C) Pulse Rate: (!) 51 Resp: 18 BP: (!) 177/77 Patient Position (if appropriate): Lying Oxygen Therapy SpO2: 96 % Pain: denies    Therapy/Group: Individual Therapy  ALorie Phenix6/17/2022, 8:05 AM

## 2021-04-16 NOTE — Progress Notes (Signed)
Occupational Therapy Session Note  Patient Details  Name: Shannon Horton. MRN: [id number] Date of Birth: Apr 30, 1946  Today's Date: 04/16/2021 OT Individual Time: 0034-9179 OT Individual Time Calculation (min): 29 min    Skilled Therapeutic Interventions/Progress Updates:    Pt greeted in the recliner with no c/o pain. Agreeable to start session by using the restroom. Difficult for him to sequence stand pivot without AD, Min A for sit<stand but then he sat back down. Mod A for stand pivot with pt initiating high squat pivot. Stand pivot<toilet completed with Min-Mod A and vcs. Full supervision while he voided as pt would shift his weight quite a bit and also attempt to stand without OT standing beside him. Pt with B+B void. Mod A for sit<stand with RW where OT assisted with hygiene + clothing, tasks verbal cues for improving upright posture due to kyphosis. Min-Mod A for stand pivot<w/c using device. After completing hand washing, pt remained sitting up in the w/c, all needs within reach and safety belt fastened. Tx focus placed on ADL retraining and functional transfers.   Therapy Documentation Precautions:  Precautions Precautions: Fall Precaution Comments: R crani Restrictions Weight Bearing Restrictions: No  Vital Signs: Therapy Vitals Temp: 98.5 F (36.9 C) Pulse Rate: (!) 57 Resp: 18 BP: (!) 155/79 Patient Position (if appropriate): Sitting Oxygen Therapy SpO2: 99 % O2 Device: Room Air  ADL: ADL Eating: Set up Where Assessed-Eating: Bed level Grooming: Minimal assistance Where Assessed-Grooming: Standing at sink Upper Body Bathing: Minimal assistance (sitting balance) Where Assessed-Upper Body Bathing: Shower Lower Body Bathing: Minimal assistance Where Assessed-Lower Body Bathing: Shower Upper Body Dressing: Minimal assistance Where Assessed-Upper Body Dressing: Chair Lower Body Dressing: Moderate assistance Where Assessed-Lower Body Dressing: Chair Toileting:  Minimal assistance Where Assessed-Toileting: Glass blower/designer: Psychiatric nurse Method: Counselling psychologist: Energy manager: Minimal assistance, Moderate cueing Social research officer, government Method: Heritage manager: Radio broadcast assistant     Therapy/Group: Individual Therapy  Dahl Higinbotham A Ewan Grau 04/16/2021, 3:24 PM

## 2021-04-16 NOTE — Progress Notes (Signed)
Occupational Therapy Session Note  Patient Details  Name: Shannon Horton. MRN: [id number] Date of Birth: 12-Oct-1946  Today's Date: 04/16/2021 OT Individual Time: 1115-1200 OT Individual Time Calculation (min): 45 min    Short Term Goals: Week 1:  OT Short Term Goal 1 (Week 1): Pt iwll don shirt sitting unsupported wiht S for components and sitting balance OT Short Term Goal 1 - Progress (Week 1): Met OT Short Term Goal 2 (Week 1): Pt will maintain dynamic sitting blaance with S and min cuing for midline orientation OT Short Term Goal 2 - Progress (Week 1): Met OT Short Term Goal 3 (Week 1): Pt will transfer to toilet with CGA and no more than MIN VC for RW management/proximity OT Short Term Goal 3 - Progress (Week 1): Progressing toward goal OT Short Term Goal 4 (Week 1): Pt will thread BLE into pants OT Short Term Goal 4 - Progress (Week 1): Met  Skilled Therapeutic Interventions/Progress Updates:     Pt received in bed agreeable to OT and no pain reported  Therapeutic activity Pt completes stand pivot transfers with initial transfer requring MAX A d/t posteirore L lean and increased flexed posture. Pt with improved transfers pushing up from arm rests of w/c and firm mat table later in session requiring MIN-MOD A. Pt completes seated and standing balnce activities initially staring with upward reaching for cones in prep for functional laundry unfolding/hanging task on high clothing rack. Pt requires intiially total multimodal cuing to lean to window/ball on R and maintain upright posture/lean back to touch therapist sitting behind pt for midline orientation in all planes. Decreased overtime to MOD cuing tactily. Pt with poor sitting balance/initiation of fixing especially during functional task.   Pt left at end of session in recliner with exit alarm on, call light in reach and all needs met   Therapy Documentation Precautions:  Precautions Precautions: Fall Precaution  Comments: R crani Restrictions Weight Bearing Restrictions: No General:   Vital Signs: Therapy Vitals Temp: 98.4 F (36.9 C) Pulse Rate: (!) 51 Resp: 18 BP: (!) 177/77 Patient Position (if appropriate): Lying Oxygen Therapy SpO2: 96 % Pain:   ADL: ADL Eating: Set up Where Assessed-Eating: Bed level Grooming: Minimal assistance Where Assessed-Grooming: Standing at sink Upper Body Bathing: Minimal assistance (sitting balance) Where Assessed-Upper Body Bathing: Shower Lower Body Bathing: Minimal assistance Where Assessed-Lower Body Bathing: Shower Upper Body Dressing: Minimal assistance Where Assessed-Upper Body Dressing: Chair Lower Body Dressing: Moderate assistance Where Assessed-Lower Body Dressing: Chair Toileting: Minimal assistance Where Assessed-Toileting: Glass blower/designer: Psychiatric nurse Method: Counselling psychologist: Energy manager: Minimal assistance, Moderate cueing Social research officer, government Method: Heritage manager: Educational psychologist   Exercises:   Other Treatments:     Therapy/Group: Individual Therapy  Tonny Branch 04/16/2021, 8:59 AM

## 2021-04-16 NOTE — Progress Notes (Signed)
Speech Language Pathology Daily Session Note  Patient Details  Name: Shannon Horton. MRN: [id number] Date of Birth: May 18, 1946  Today's Date: 04/16/2021 SLP Individual Time: 1445-1530 SLP Individual Time Calculation (min): 45 min  Short Term Goals: Week 2: SLP Short Term Goal 1 (Week 2): Patient will utilize memory aides/strategies for recall of new information with Min A cues. SLP Short Term Goal 2 (Week 2): Pt will increase sustained attention for functional tasks to 10 minute intervals provided Mod A cues SLP Short Term Goal 3 (Week 2): Patient will utilize orientation aides/strategies to orient to time, situation, and location with Min A cues SLP Short Term Goal 4 (Week 2): Patient will complete basic problem solving tasks with 70% accuracy and Mod A cues  Skilled Therapeutic Interventions: Skilled SLP session performed with focus on cognitive goals. Patient answered Facetime call from son at start of session. He required frequent verbal and tactile cueing to arouse and maintain simple conversational exchange secondary to lethargy. Only able to sustain attention for approximately 3 minutes. SLP facilitated hands-on sorting task with functional daily items (placing grocery items in fridge versus. pantry). Patient completed task for 15 minutes with 75% accuracy and mod-to-max A verbal cues for problem solving and Mod assist for sustained attention (goal met). Patient was transferred to bed at end of session with alarm activated and needs within reach. Continue with current plan of care.    Pain Pain Assessment Pain Scale: 0-10 Pain Score: 0-No pain Pain Type: Acute pain Pain Location: Head Pain Orientation: Right Pain Descriptors / Indicators: Headache Pain Frequency: Intermittent Pain Onset: On-going Patients Stated Pain Goal: 1 Pain Intervention(s): Medication (See eMAR) Multiple Pain Sites: No  Therapy/Group: Individual Therapy  Patty Sermons 04/16/2021, 4:28 PM

## 2021-04-16 NOTE — Progress Notes (Addendum)
PROGRESS NOTE   Subjective/Complaints: More up beat today. I asked him about yesterday and not wanting to do therapy, potential depression.  He said that he was distressed about taking staples out yesterday, and that today he was feeling better.   ROS: Patient denies fever, rash, sore throat, blurred vision, nausea, vomiting, diarrhea, cough, shortness of breath or chest pain, joint or back pain, headache, or mood change.   Objective:   No results found. Recent Labs    04/16/21 0511  WBC 4.8  HGB 14.3  HCT 40.8  PLT 201   Recent Labs    04/14/21 0510 04/16/21 0511  NA 135 130*  K 4.0 3.7  CL 98 92*  CO2 28 26  GLUCOSE 99 116*  BUN 8 6*  CREATININE 0.70 0.55*  CALCIUM 8.8* 9.6    Intake/Output Summary (Last 24 hours) at 04/16/2021 1132 Last data filed at 04/16/2021 0855 Gross per 24 hour  Intake 613 ml  Output 300 ml  Net 313 ml        Physical Exam: Vital Signs Blood pressure (!) 177/77, pulse (!) 51, temperature 98.4 F (36.9 C), resp. rate 18, height 5\' 8"  (1.727 m), SpO2 96 %. Constitutional: No distress . Vital signs reviewed. HEENT: EOMI, oral membranes moist Neck: supple Cardiovascular: RRR without murmur. No JVD    Respiratory/Chest: CTA Bilaterally without wheezes or rales. Normal effort    GI/Abdomen: BS +, non-tender, non-distended Ext: no clubbing, cyanosis, or edema Psych: pleasant and cooperative Skin: scalp incision with staples, some associated scab in hair, loose Neuro: alert, oriented to person, place, why he's here. Follows commands. Normal language and speech. Moving all 4 limbs spontaneously Musculoskeletal: Full ROM, No pain with AROM or PROM in the neck, trunk, or extremities. Posture appropriate      Assessment/Plan: 1. Functional deficits which require 3+ hours per day of interdisciplinary therapy in a comprehensive inpatient rehab setting. Physiatrist is providing close team  supervision and 24 hour management of active medical problems listed below. Physiatrist and rehab team continue to assess barriers to discharge/monitor patient progress toward functional and medical goals  Care Tool:  Bathing    Body parts bathed by patient: Right arm, Left arm, Chest, Abdomen, Front perineal area, Right upper leg, Left upper leg, Right lower leg, Left lower leg, Face   Body parts bathed by helper: Buttocks     Bathing assist Assist Level: Minimal Assistance - Patient > 75%     Upper Body Dressing/Undressing Upper body dressing   What is the patient wearing?: Pull over shirt    Upper body assist Assist Level: Minimal Assistance - Patient > 75%    Lower Body Dressing/Undressing Lower body dressing      What is the patient wearing?: Pants     Lower body assist Assist for lower body dressing: Moderate Assistance - Patient 50 - 74%     Toileting Toileting    Toileting assist Assist for toileting: Independent with assistive device Assistive Device Comment: urinal   Transfers Chair/bed transfer  Transfers assist     Chair/bed transfer assist level: Moderate Assistance - Patient 50 - 74% Chair/bed transfer assistive device: Environmental consultant  Locomotion Ambulation   Ambulation assist      Assist level: Moderate Assistance - Patient 50 - 74% Assistive device: Walker-rolling Max distance: 46 ft   Walk 10 feet activity   Assist     Assist level: Contact Guard/Touching assist Assistive device: Walker-rolling   Walk 50 feet activity   Assist    Assist level: Minimal Assistance - Patient > 75% Assistive device: Walker-rolling    Walk 150 feet activity   Assist    Assist level: Minimal Assistance - Patient > 75% Assistive device: Walker-rolling    Walk 10 feet on uneven surface  activity   Assist Walk 10 feet on uneven surfaces activity did not occur: Safety/medical concerns (decreased safety/spatial awareness)          Wheelchair     Assist        Wheelchair assist level: Supervision/Verbal cueing Max wheelchair distance: 204 ft    Wheelchair 50 feet with 2 turns activity    Assist        Assist Level: Supervision/Verbal cueing   Wheelchair 150 feet activity     Assist      Assist Level: Supervision/Verbal cueing   Blood pressure (!) 177/77, pulse (!) 51, temperature 98.4 F (36.9 C), resp. rate 18, height 5\' 8"  (1.727 m), SpO2 96 %.  Medical Problem List and Plan: 1.  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  Follow-up oncology services. Pathology positive for malignant melanoma             -patient may shower             -ELOS/Goals: 13-20 days, supervision for PT, OT, SLP  -Continue CIR therapies including PT, OT, and SLP   2.  Antithrombotics: -DVT/anticoagulation: Subcutaneous heparin.             -antiplatelet therapy: N/A 3. Pain Management: N/A 4. Mood: Xanax 0.25 mg 3 times daily as needed  -reactive depression:   -appreciate neuropsych input and treatment    -begin trial of remeron 7.5mg  qhs which should help appetite too             -antipsychotic agents: N/A 5. Neuropsych: This patient is capable of making decisions on his own behalf. 6. Skin/Wound Care: Routine skin checks             -keep scalp clean and dry--stable in appearance today 7. Fluids/Electrolytes/Nutrition: encourage PO   6/17 back down to 130 today   -off IVF, continue 1500cc FR  -protein supplement for low albumin  -began remeron as above to help with appetite  -labs Monday 8.  Hypertension.  Norvasc 5 mg daily, Avapro 300 mg daily, HCTZ 25 mg daily.  Monitor with increased mobility             -bp controlled 6/10---avoid hypotension. May need to back off meds a bit  6/11- D/c Norvasc for now, BP is 80s/90s laying and worse sitting up- using TEDs, ACE wraps and Abd  binder.   6/17 bp's trending back up. Resume norvasc 5mg   9.  Seizure prophylaxis.  Keppra 500 mg twice daily 10.  Glaucoma.  Followed by Dr. Satira Sark.  Continue eyedrops. 11.  History of asthma.  Continue inhalers as directed.  Check oxygen saturations every shift 12.  Constipation.               -miralax bid             -  senna-s at bedtime  -sorbitol 6/8 with results  -magnesium gluconate 250mg  ordered HS  -had small bm 6/16 but not consistently--give sorbitol today 13. Altered mental status  -improved after rx of pneumonia-   14. Pneumonia- HCAP  6/17    -completed 14 doses of cefepime   -observe off abx for now   -encouraged OOB, IS, etc   -cbc wnl today 15. Insomnia: magnesium gluconate 250mg  ordered HS 16. Hypokalemia: improved. Reduced to 62meq kdur daily for now.   -6/17 3.7 continue daily supplement---check labs Monday 17. Vitamin D deficiency: ergocalciferol 50,000U once per week x7 weeks ordered    LOS: 10 days A FACE TO FACE EVALUATION WAS PERFORMED  [name] 04/16/2021, 11:32 AM

## 2021-04-17 ENCOUNTER — Inpatient Hospital Stay (HOSPITAL_COMMUNITY): Payer: Medicare PPO

## 2021-04-17 LAB — CBC WITH DIFFERENTIAL/PLATELET
Abs Immature Granulocytes: 0.05 10*3/uL (ref 0.00–0.07)
Basophils Absolute: 0 10*3/uL (ref 0.0–0.1)
Basophils Relative: 0 %
Eosinophils Absolute: 0 10*3/uL (ref 0.0–0.5)
Eosinophils Relative: 0 %
HCT: 46.3 % (ref 39.0–52.0)
Hemoglobin: 16.5 g/dL (ref 13.0–17.0)
Immature Granulocytes: 1 %
Lymphocytes Relative: 17 %
Lymphs Abs: 1.1 10*3/uL (ref 0.7–4.0)
MCH: 31.7 pg (ref 26.0–34.0)
MCHC: 35.6 g/dL (ref 30.0–36.0)
MCV: 89 fL (ref 80.0–100.0)
Monocytes Absolute: 0.5 10*3/uL (ref 0.1–1.0)
Monocytes Relative: 8 %
Neutro Abs: 5 10*3/uL (ref 1.7–7.7)
Neutrophils Relative %: 74 %
Platelets: 224 10*3/uL (ref 150–400)
RBC: 5.2 MIL/uL (ref 4.22–5.81)
RDW: 11.9 % (ref 11.5–15.5)
WBC: 6.7 10*3/uL (ref 4.0–10.5)
nRBC: 0 % (ref 0.0–0.2)

## 2021-04-17 LAB — URINALYSIS, ROUTINE W REFLEX MICROSCOPIC
Bilirubin Urine: NEGATIVE
Glucose, UA: NEGATIVE mg/dL
Ketones, ur: 80 mg/dL — AB
Leukocytes,Ua: NEGATIVE
Nitrite: NEGATIVE
Protein, ur: NEGATIVE mg/dL
Specific Gravity, Urine: 1.011 (ref 1.005–1.030)
pH: 7 (ref 5.0–8.0)

## 2021-04-17 MED ORDER — SODIUM CHLORIDE 0.9 % IV SOLN: INTRAVENOUS | Status: DC

## 2021-04-17 MED ORDER — SODIUM CHLORIDE 0.9 % IV SOLN
1.0000 g | INTRAVENOUS | Status: DC
  Administered 2021-04-17 – 2021-04-18 (×2): 1 g via INTRAVENOUS
  Filled 2021-04-17 (×2): qty 1

## 2021-04-17 MED ORDER — AMLODIPINE BESYLATE 10 MG PO TABS
10.0000 mg | ORAL_TABLET | Freq: Every day | ORAL | Status: DC
  Administered 2021-04-18: 10 mg via ORAL
  Filled 2021-04-17: qty 1

## 2021-04-17 NOTE — Progress Notes (Signed)
Patient family states it is okay for Shannon Horton is allowed to have patient information shared with him

## 2021-04-17 NOTE — Progress Notes (Signed)
PROGRESS NOTE   Subjective/Complaints: Per LPN, required urinary cath, urine very cloudy, pt flushed this am  Spoke with family about mental status which they noted decline starting yesterday   ROS: Patient lethargic follows commands but does not answer consistently  Objective:   No results found. Recent Labs    04/16/21 0511  WBC 4.8  HGB 14.3  HCT 40.8  PLT 201    Recent Labs    04/16/21 0511  NA 130*  K 3.7  CL 92*  CO2 26  GLUCOSE 116*  BUN 6*  CREATININE 0.55*  CALCIUM 9.6     Intake/Output Summary (Last 24 hours) at 04/17/2021 1049 Last data filed at 04/17/2021 1043 Gross per 24 hour  Intake 657 ml  Output 1070 ml  Net -413 ml         Physical Exam: Vital Signs Blood pressure (!) 173/85, pulse (!) 58, temperature (!) 97.4 F (36.3 C), temperature source Oral, resp. rate 14, height 5\' 8"  (1.727 m), SpO2 98 %.  General: No acute distress Mood and affect are appropriate Heart: Regular rate and rhythm no rubs murmurs or extra sounds Lungs: poor insp effort breathing unlabored, no rales or wheezes Abdomen: Positive bowel sounds, soft nontender to palpation, nondistended Extremities: No clubbing, cyanosis, or edema Skin: No evidence of breakdown, no evidence of rash   Neuro: alert, oriented to person, place, why he's here. Follows commands. Normal language and speech. 4/5 in BIlateral delt, bi, tri, grip R HF, KE ADF 3- Left HF, KE ADF Musculoskeletal: Full ROM, No pain with AROM or PROM in the neck, trunk, or extremities. Posture appropriate      Assessment/Plan: 1. Functional deficits which require 3+ hours per day of interdisciplinary therapy in a comprehensive inpatient rehab setting. Physiatrist is providing close team supervision and 24 hour management of active medical problems listed below. Physiatrist and rehab team continue to assess barriers to discharge/monitor patient progress  toward functional and medical goals  Care Tool:  Bathing    Body parts bathed by patient: Right arm, Left arm, Chest, Abdomen, Front perineal area, Right upper leg, Left upper leg, Right lower leg, Left lower leg, Face   Body parts bathed by helper: Buttocks     Bathing assist Assist Level: Minimal Assistance - Patient > 75%     Upper Body Dressing/Undressing Upper body dressing   What is the patient wearing?: Pull over shirt    Upper body assist Assist Level: Minimal Assistance - Patient > 75%    Lower Body Dressing/Undressing Lower body dressing      What is the patient wearing?: Pants     Lower body assist Assist for lower body dressing: Moderate Assistance - Patient 50 - 74%     Toileting Toileting    Toileting assist Assist for toileting: Maximal Assistance - Patient 25 - 49% Assistive Device Comment: urinal   Transfers Chair/bed transfer  Transfers assist     Chair/bed transfer assist level: Moderate Assistance - Patient 50 - 74% Chair/bed transfer assistive device: Programmer, multimedia   Ambulation assist      Assist level: Moderate Assistance - Patient 50 - 74% Assistive device:  Walker-rolling Max distance: 46 ft   Walk 10 feet activity   Assist     Assist level: Contact Guard/Touching assist Assistive device: Walker-rolling   Walk 50 feet activity   Assist    Assist level: Minimal Assistance - Patient > 75% Assistive device: Walker-rolling    Walk 150 feet activity   Assist    Assist level: Minimal Assistance - Patient > 75% Assistive device: Walker-rolling    Walk 10 feet on uneven surface  activity   Assist Walk 10 feet on uneven surfaces activity did not occur: Safety/medical concerns (decreased safety/spatial awareness)         Wheelchair     Assist        Wheelchair assist level: Supervision/Verbal cueing Max wheelchair distance: 204 ft    Wheelchair 50 feet with 2 turns  activity    Assist        Assist Level: Supervision/Verbal cueing   Wheelchair 150 feet activity     Assist      Assist Level: Supervision/Verbal cueing   Blood pressure (!) 173/85, pulse (!) 58, temperature (!) 97.4 F (36.3 C), temperature source Oral, resp. rate 14, height 5\' 8"  (1.727 m), SpO2 98 %.  Medical Problem List and Plan: 1.  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  Follow-up oncology services. Pathology positive for malignant melanoma             -patient may shower             -ELOS/Goals: 13-20 days, supervision for PT, OT, SLP  -Continue CIR therapies including PT, OT, and SLP   2.  Antithrombotics: -DVT/anticoagulation: Subcutaneous heparin.             -antiplatelet therapy: N/A 3. Pain Management: N/A 4. Mood: Xanax 0.25 mg 3 times daily as needed  -reactive depression:   -appreciate neuropsych input and treatment    -begin trial of remeron 7.5mg  qhs which should help appetite too             -antipsychotic agents: N/A 5. Neuropsych: This patient is capable of making decisions on his own behalf. 6. Skin/Wound Care: Routine skin checks             -keep scalp clean and dry--stable in appearance today 7. Fluids/Electrolytes/Nutrition: encourage PO   6/17 back Na+ 130 today   -off IVF, continue 1500cc FR  -protein supplement for low albumin  -began remeron as above to help with appetite  -labs Monday 8.  Hypertension.  Norvasc 5 mg daily, Avapro 300 mg daily, HCTZ 25 mg daily.  Monitor with increased mobility             -bp controlled 6/10---avoid hypotension. May need to back off meds a bit  6/11- D/c Norvasc for now, BP is 80s/90s laying and worse sitting up- using TEDs, ACE wraps and Abd binder.   6/17 bp's trending back up. Resume norvasc 5mg   Poor fluid intake, Hypo Na+ d/c HCTZ Vitals:   04/17/21 0552 04/17/21 1021   BP: (!) 167/89 (!) 173/85  Pulse: (!) 54 (!) 58  Resp:  14  Temp:  (!) 97.4 F (36.3 C)  SpO2:  98%   6/18 still elevated increase amlodipine to 10mg  , red/c HCTZ due to poor intake an dHypo Na 9.  Seizure prophylaxis.  Keppra 500 mg twice daily 10.  Glaucoma.  Followed by Dr. Satira Sark.  Continue eyedrops. 11.  History of asthma.  Continue inhalers as directed.  Check oxygen saturations every shift 12.  Constipation.               -miralax bid             -senna-s at bedtime  -sorbitol 6/8 with results  -magnesium gluconate 250mg  ordered HS  -had small bm 6/16 but not consistently--give sorbitol today 13. Altered mental status  Worsened again, urine cloudy suspect UTI vs recurrent PNA W/u in progress, CXR , UA C and S Resume IVF 14. Pneumonia- HCAP  6/17    -completed 14 doses of cefepime   -observe off abx for now   -encouraged OOB, IS, etc   -cbc wnl today 15. Insomnia: magnesium gluconate 250mg  ordered HS 16. Hypokalemia: improved. Reduced to 84meq kdur daily for now.   -6/17 3.7 continue daily supplement---check labs Monday 17. Vitamin D deficiency: ergocalciferol 50,000U once per week x7 weeks ordered    LOS: 11 days A FACE TO FACE EVALUATION WAS PERFORMED  Charlett Blake 04/17/2021, 10:49 AM

## 2021-04-18 ENCOUNTER — Inpatient Hospital Stay (HOSPITAL_COMMUNITY): Payer: Medicare PPO

## 2021-04-18 ENCOUNTER — Inpatient Hospital Stay (HOSPITAL_COMMUNITY)
Admission: AD | Admit: 2021-04-18 | Discharge: 2021-04-21 | DRG: 054 | Disposition: A | Discharge status: Rehabilitation Facility | Payer: Medicare PPO | Source: Ambulatory Visit | Attending: Neurological Surgery | Admitting: Neurological Surgery

## 2021-04-18 DIAGNOSIS — Z85820 Personal history of malignant melanoma of skin: Secondary | ICD-10-CM | POA: Diagnosis not present

## 2021-04-18 DIAGNOSIS — D496 Neoplasm of unspecified behavior of brain: Secondary | ICD-10-CM | POA: Diagnosis not present

## 2021-04-18 DIAGNOSIS — Z515 Encounter for palliative care: Secondary | ICD-10-CM | POA: Diagnosis not present

## 2021-04-18 DIAGNOSIS — R471 Dysarthria and anarthria: Secondary | ICD-10-CM | POA: Diagnosis present

## 2021-04-18 DIAGNOSIS — G9340 Encephalopathy, unspecified: Secondary | ICD-10-CM | POA: Diagnosis not present

## 2021-04-18 DIAGNOSIS — Z79899 Other long term (current) drug therapy: Secondary | ICD-10-CM | POA: Diagnosis not present

## 2021-04-18 DIAGNOSIS — I615 Nontraumatic intracerebral hemorrhage, intraventricular: Secondary | ICD-10-CM | POA: Diagnosis not present

## 2021-04-18 DIAGNOSIS — Z66 Do not resuscitate: Secondary | ICD-10-CM | POA: Diagnosis not present

## 2021-04-18 DIAGNOSIS — R4182 Altered mental status, unspecified: Secondary | ICD-10-CM | POA: Diagnosis present

## 2021-04-18 DIAGNOSIS — Z809 Family history of malignant neoplasm, unspecified: Secondary | ICD-10-CM | POA: Diagnosis not present

## 2021-04-18 DIAGNOSIS — I10 Essential (primary) hypertension: Secondary | ICD-10-CM | POA: Diagnosis not present

## 2021-04-18 DIAGNOSIS — G8194 Hemiplegia, unspecified affecting left nondominant side: Secondary | ICD-10-CM | POA: Diagnosis present

## 2021-04-18 DIAGNOSIS — C711 Malignant neoplasm of frontal lobe: Secondary | ICD-10-CM | POA: Diagnosis not present

## 2021-04-18 DIAGNOSIS — E876 Hypokalemia: Secondary | ICD-10-CM | POA: Diagnosis present

## 2021-04-18 DIAGNOSIS — J45909 Unspecified asthma, uncomplicated: Secondary | ICD-10-CM | POA: Diagnosis present

## 2021-04-18 DIAGNOSIS — Z8249 Family history of ischemic heart disease and other diseases of the circulatory system: Secondary | ICD-10-CM

## 2021-04-18 DIAGNOSIS — Z91018 Allergy to other foods: Secondary | ICD-10-CM

## 2021-04-18 DIAGNOSIS — C439 Malignant melanoma of skin, unspecified: Secondary | ICD-10-CM | POA: Diagnosis not present

## 2021-04-18 DIAGNOSIS — C7931 Secondary malignant neoplasm of brain: Secondary | ICD-10-CM | POA: Diagnosis not present

## 2021-04-18 DIAGNOSIS — Z8371 Family history of colonic polyps: Secondary | ICD-10-CM | POA: Diagnosis not present

## 2021-04-18 DIAGNOSIS — E440 Moderate protein-calorie malnutrition: Secondary | ICD-10-CM | POA: Diagnosis not present

## 2021-04-18 DIAGNOSIS — R296 Repeated falls: Secondary | ICD-10-CM | POA: Diagnosis present

## 2021-04-18 DIAGNOSIS — C7971 Secondary malignant neoplasm of right adrenal gland: Secondary | ICD-10-CM | POA: Diagnosis not present

## 2021-04-18 DIAGNOSIS — R5383 Other fatigue: Secondary | ICD-10-CM | POA: Diagnosis not present

## 2021-04-18 DIAGNOSIS — R404 Transient alteration of awareness: Secondary | ICD-10-CM | POA: Diagnosis not present

## 2021-04-18 DIAGNOSIS — E871 Hypo-osmolality and hyponatremia: Secondary | ICD-10-CM | POA: Diagnosis present

## 2021-04-18 DIAGNOSIS — Z7189 Other specified counseling: Secondary | ICD-10-CM | POA: Diagnosis not present

## 2021-04-18 DIAGNOSIS — Z87891 Personal history of nicotine dependence: Secondary | ICD-10-CM

## 2021-04-18 DIAGNOSIS — H409 Unspecified glaucoma: Secondary | ICD-10-CM | POA: Diagnosis present

## 2021-04-18 DIAGNOSIS — I69222 Dysarthria following other nontraumatic intracranial hemorrhage: Secondary | ICD-10-CM | POA: Diagnosis not present

## 2021-04-18 DIAGNOSIS — G9389 Other specified disorders of brain: Secondary | ICD-10-CM | POA: Diagnosis not present

## 2021-04-18 DIAGNOSIS — I69254 Hemiplegia and hemiparesis following other nontraumatic intracranial hemorrhage affecting left non-dominant side: Secondary | ICD-10-CM | POA: Diagnosis not present

## 2021-04-18 LAB — CBC WITH DIFFERENTIAL/PLATELET
Abs Immature Granulocytes: 0.06 10*3/uL (ref 0.00–0.07)
Basophils Absolute: 0 10*3/uL (ref 0.0–0.1)
Basophils Relative: 0 %
Eosinophils Absolute: 0 10*3/uL (ref 0.0–0.5)
Eosinophils Relative: 1 %
HCT: 37.7 % — ABNORMAL LOW (ref 39.0–52.0)
Hemoglobin: 13.3 g/dL (ref 13.0–17.0)
Immature Granulocytes: 1 %
Lymphocytes Relative: 14 %
Lymphs Abs: 0.8 10*3/uL (ref 0.7–4.0)
MCH: 31.4 pg (ref 26.0–34.0)
MCHC: 35.3 g/dL (ref 30.0–36.0)
MCV: 88.9 fL (ref 80.0–100.0)
Monocytes Absolute: 0.4 10*3/uL (ref 0.1–1.0)
Monocytes Relative: 7 %
Neutro Abs: 4.4 10*3/uL (ref 1.7–7.7)
Neutrophils Relative %: 77 %
Platelets: 190 10*3/uL (ref 150–400)
RBC: 4.24 MIL/uL (ref 4.22–5.81)
RDW: 12.2 % (ref 11.5–15.5)
WBC: 5.7 10*3/uL (ref 4.0–10.5)
nRBC: 0 % (ref 0.0–0.2)

## 2021-04-18 LAB — URINE CULTURE: Culture: NO GROWTH

## 2021-04-18 MED ORDER — LATANOPROST 0.005 % OP SOLN
1.0000 [drp] | Freq: Every day | OPHTHALMIC | Status: DC
  Administered 2021-04-18 – 2021-04-20 (×3): 1 [drp] via OPHTHALMIC
  Filled 2021-04-18: qty 2.5

## 2021-04-18 MED ORDER — LEVETIRACETAM 500 MG PO TABS: 500.0000 mg | ORAL_TABLET | Freq: Two times a day (BID) | ORAL | Status: DC

## 2021-04-18 MED ORDER — AMLODIPINE BESYLATE 5 MG PO TABS
5.0000 mg | ORAL_TABLET | Freq: Every day | ORAL | Status: DC
  Administered 2021-04-19 – 2021-04-21 (×3): 5 mg via ORAL
  Filled 2021-04-18 (×3): qty 1

## 2021-04-18 MED ORDER — IRBESARTAN 300 MG PO TABS
300.0000 mg | ORAL_TABLET | Freq: Every day | ORAL | Status: DC
  Administered 2021-04-19 – 2021-04-21 (×3): 300 mg via ORAL
  Filled 2021-04-18 (×2): qty 1
  Filled 2021-04-18: qty 2

## 2021-04-18 MED ORDER — LEVETIRACETAM IN NACL 500 MG/100ML IV SOLN
500.0000 mg | Freq: Two times a day (BID) | INTRAVENOUS | Status: DC
  Administered 2021-04-18 – 2021-04-20 (×4): 500 mg via INTRAVENOUS
  Filled 2021-04-18 (×4): qty 100

## 2021-04-18 MED ORDER — ONDANSETRON HCL 4 MG/2ML IJ SOLN: 4.0000 mg | Freq: Four times a day (QID) | INTRAMUSCULAR | Status: DC | PRN

## 2021-04-18 MED ORDER — ALBUTEROL SULFATE HFA 108 (90 BASE) MCG/ACT IN AERS
2.0000 | INHALATION_SPRAY | RESPIRATORY_TRACT | Status: DC | PRN
  Filled 2021-04-18: qty 6.7

## 2021-04-18 MED ORDER — TELMISARTAN-HCTZ 80-25 MG PO TABS: 1.0000 | ORAL_TABLET | Freq: Every day | ORAL | Status: DC

## 2021-04-18 MED ORDER — SODIUM CHLORIDE 0.9 % IV SOLN: INTRAVENOUS | Status: DC

## 2021-04-18 MED ORDER — CHLORHEXIDINE GLUCONATE CLOTH 2 % EX PADS
6.0000 | MEDICATED_PAD | Freq: Every day | CUTANEOUS | Status: DC
  Administered 2021-04-19 – 2021-04-21 (×3): 6 via TOPICAL

## 2021-04-18 MED ORDER — DOCUSATE SODIUM 100 MG PO CAPS: 100.0000 mg | ORAL_CAPSULE | Freq: Two times a day (BID) | ORAL | Status: DC | PRN

## 2021-04-18 MED ORDER — ACETAMINOPHEN 325 MG PO TABS: 650.0000 mg | ORAL_TABLET | ORAL | Status: DC | PRN

## 2021-04-18 MED ORDER — HYDROCHLOROTHIAZIDE 25 MG PO TABS
25.0000 mg | ORAL_TABLET | Freq: Every day | ORAL | Status: DC
  Administered 2021-04-19 – 2021-04-21 (×3): 25 mg via ORAL
  Filled 2021-04-18 (×3): qty 1

## 2021-04-18 MED ORDER — POLYETHYLENE GLYCOL 3350 17 G PO PACK: 17.0000 g | PACK | Freq: Every day | ORAL | Status: DC | PRN

## 2021-04-18 MED ORDER — DEXAMETHASONE SODIUM PHOSPHATE 4 MG/ML IJ SOLN
1.0000 mg | Freq: Two times a day (BID) | INTRAMUSCULAR | Status: DC
  Administered 2021-04-18: 1 mg via INTRAVENOUS
  Filled 2021-04-18: qty 1

## 2021-04-18 MED ORDER — PANTOPRAZOLE SODIUM 40 MG PO TBEC: 40.0000 mg | DELAYED_RELEASE_TABLET | Freq: Every day | ORAL | Status: DC

## 2021-04-18 MED ORDER — HYDRALAZINE HCL 20 MG/ML IJ SOLN: 10.0000 mg | INTRAMUSCULAR | Status: DC | PRN

## 2021-04-18 MED ORDER — PANTOPRAZOLE SODIUM 40 MG IV SOLR
40.0000 mg | Freq: Every day | INTRAVENOUS | Status: DC
  Administered 2021-04-18 – 2021-04-19 (×2): 40 mg via INTRAVENOUS
  Filled 2021-04-18 (×2): qty 40

## 2021-04-18 MED ORDER — BUDESONIDE 0.25 MG/2ML IN SUSP
0.2500 mg | Freq: Two times a day (BID) | RESPIRATORY_TRACT | Status: DC
  Administered 2021-04-18 – 2021-04-21 (×6): 0.25 mg via RESPIRATORY_TRACT
  Filled 2021-04-18 (×6): qty 2

## 2021-04-18 MED ORDER — DEXAMETHASONE 0.5 MG PO TABS
1.0000 mg | ORAL_TABLET | Freq: Two times a day (BID) | ORAL | Status: DC
  Filled 2021-04-18: qty 2

## 2021-04-18 NOTE — Discharge Summary (Signed)
Physician Discharge Summary  Patient ID: Shannon Horton. MRN: [id number] DOB/AGE: 75-Apr-1947 75 y.o.  Admit date: 04/06/2021 Discharge date: 04/18/2021  Discharge Diagnoses:  Principal Problem:   Brain tumor [facility]) Active Problems:   Depression due to physical illness Hypertension Seizure prophylaxis Glaucoma History of asthma Pneumonia Tobacco use History of remote melanoma x2  Discharged Condition: Stable  Significant Diagnostic Studies: DG Chest 2 View  Result Date: 04/17/2021 CLINICAL DATA:  Cough EXAM: CHEST - 2 VIEW COMPARISON:  04/10/2021, CT from 03/25/2021 FINDINGS: Cardiac shadow is stable. Mild tortuous thoracic aorta is again seen. Bibasilar airspace opacity is noted slightly improved when compared with the prior exam. Fullness of the right hilum is noted related to vessel on end. Stable right upper lobe nodule is noted seen on previous CT. The known left upper lobe nodule is not as well appreciated on this exam as on prior CT. No sizable effusion is seen. No acute bony abnormality is noted. IMPRESSION: Improving bibasilar airspace opacity. Stable right upper lobe nodule unchanged from prior CT. The known left upper lobe sub solid nodule is not as well appreciated on this exam. Electronically Signed   By: Inez Catalina M.D.   On: 04/17/2021 15:33   DG Chest 2 View  Result Date: 04/10/2021 CLINICAL DATA:  Follow-up of pneumonia EXAM: CHEST - 2 VIEW COMPARISON:  1 day prior FINDINGS: Osteopenia. Hyperinflation. The Chin overlies the apices. Midline trachea. Normal heart size. Atherosclerosis in the transverse aorta. No pleural effusion or pneumothorax. Left greater than right base airspace disease, slightly increased. 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 IMPRESSION: Slightly increased left greater than right base airspace disease, suspicious for infection or aspiration. Underlying hyperinflation, suggesting COPD. Aortic Atherosclerosis (ICD10-I70.0).  Electronically Signed   By: Abigail Miyamoto M.D.   On: 04/10/2021 08:34   DG Chest 2 View  Result Date: 04/09/2021 CLINICAL DATA:  Shortness of breath EXAM: CHEST - 2 VIEW COMPARISON:  Mar 25, 2021 chest radiograph and chest CT FINDINGS: 1 cm nodular opacity in the left upper lobe is again noted and unchanged. Airspace opacity consistent with pneumonia in the lung bases is present. The heart size and pulmonary vascularity are normal. No adenopathy appreciable. There is aortic atherosclerosis. Sclerotic focus noted in the left proximal humerus, stable. IMPRESSION: 1.  Bibasilar airspace opacity consistent with bibasilar pneumonia. 2. 1 cm nodular opacity right upper lobe, unchanged. Neoplastic focus of concern. 3.  Heart size normal. 4.  Aortic Atherosclerosis (ICD10-I70.0). 5.  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 Electronically Signed   By: Lowella Grip III M.D.   On: 04/09/2021 12:59   CT HEAD WO CONTRAST  Addendum Date: 04/18/2021   ADDENDUM REPORT: 04/18/2021 11:50 ADDENDUM: Study discussed by telephone with Dr. Emelda Brothers on 04/18/2021 at 1139 hours. Electronically Signed   By: Genevie Ann M.D.   On: 04/18/2021 11:50   Result Date: 04/18/2021 CLINICAL DATA:  75 year old male with unexplained altered mental status. Right basal ganglia tumor resection earlier this month. Pathology revealing metastatic melanoma. EXAM: CT HEAD WITHOUT CONTRAST TECHNIQUE: Contiguous axial images were obtained from the base of the skull through the vertex without intravenous contrast. COMPARISON:  Postoperative head CT 04/09/2021 and earlier. FINDINGS: Brain: There is a small new volume of intraventricular hemorrhage layering within the left occipital horn on series 3, image 18. Right ventricle size appears slightly larger than before, but this might be related to decreased right hemisphere edema. No definite ventriculomegaly or transependymal edema. But no  other acute intracranial hemorrhage is  identified. Furthermore, MRI appearance on 04/01/2021 was suspicious for a small left choroid a plexus metastasis (same image as above). Stable CT appearance of postoperative changes in the right hemisphere tracking to the right lentiform. Regressed edema since 04/01/2021. No new intracranial mass or mass effect is evident. Normal basilar cisterns. No cortically based acute infarct identified. Vascular: Mild Calcified atherosclerosis at the skull base. No suspicious intracranial vascular hyperdensity. Skull: Stable right frontotemporal craniotomy. No acute osseous abnormality identified. Sinuses/Orbits: Visualized paranasal sinuses and mastoids are clear. Other: Regressed scalp postoperative changes. Orbits soft tissues remain negative. A small volume of right retro maxillary soft tissue gas has decreased and may be postoperative. IMPRESSION: 1. Positive for a small volume of acute left occipital horn IVH likely secondary to the small left choroid plexus metastasis suspected by MRI. No definite acute ventriculomegaly or transependymal edema. 2. Satisfactory noncontrast CT postoperative appearance of the right lentiform resection cavity. Electronically Signed: By: Genevie Ann M.D. On: 04/18/2021 11:28   CT HEAD WO CONTRAST  Result Date: 04/09/2021 CLINICAL DATA:  Altered mental status. EXAM: CT HEAD WITHOUT CONTRAST TECHNIQUE: Contiguous axial images were obtained from the base of the skull through the vertex without intravenous contrast. COMPARISON:  Head CT scan 03/25/2021.  Brain MRI 03/26/2021. FINDINGS: Brain: The patient has undergone right frontal craniotomy for resection of a right frontal lobe mass since the prior exams. There is a small amount of pneumocephalus and a tiny amount of hemorrhage at the surgical site. No hydrocephalus, midline shift or evidence of acute infarct. Vascular: No hyperdense vessel or unexpected calcification. Skull: No acute abnormality.  Right craniotomy defect noted.  Sinuses/Orbits: Negative. Other: None. IMPRESSION: Status post resection of a right frontal lobe mass. Small amount of pneumocephalus and a tiny amount of hemorrhage at the surgical site are consistent with postoperative change. No acute abnormality is identified. Electronically Signed   By: Inge Rise M.D.   On: 04/09/2021 12:42   CT Head Wo Contrast  Result Date: 03/25/2021 CLINICAL DATA:  Gait disorder. Intracranial hemorrhage is suspected. EXAM: CT HEAD WITHOUT CONTRAST TECHNIQUE: Contiguous axial images were obtained from the base of the skull through the vertex without intravenous contrast. COMPARISON:  None. FINDINGS: Brain: Large irregular focus of hyperdensity centered in the right basal ganglia with a large amount of vasogenic edema. Findings are compatible with intracranial hemorrhage. The irregular hemorrhagic lesion measures 4.1 x 2.8 x 3.4 cm. Hemorrhage extends down to the right temporal lobe region. There is 6 mm right to left midline shift. There is mild effacement of the right lateral ventricle. No significant intraventricular hemorrhage. No hydrocephalus. Vascular: No hyperdense vessel or unexpected calcification. Skull: Normal. Negative for fracture or focal lesion. Sinuses/Orbits: No acute finding. Other: None. IMPRESSION: 1. Large irregular intracranial hemorrhage centered in the right basal ganglia with a large amount of surrounding vasogenic edema. The morphology and vasogenic edema raises concern for an underlying hemorrhagic lesion. Recommend further characterization with MRI, with and without contrast, to evaluate for a neoplastic process. 2. 6 mm of midline shift. Critical Value/emergent results were called by telephone at the time of interpretation on 03/25/2021 at 8:48 am to provider [facility] , who verbally acknowledged these results. Electronically Signed   By: Markus Daft M.D.   On: 03/25/2021 08:54   MR BRAIN W WO CONTRAST  Result Date: 04/01/2021 CLINICAL DATA:  Postop  day 1 status post resection of a right basal ganglia region mass. Recent adrenal biopsy  demonstrating metastatic melanoma. EXAM: MRI HEAD WITHOUT AND WITH CONTRAST TECHNIQUE: Multiplanar, multiecho pulse sequences of the brain and surrounding structures were obtained without and with intravenous contrast. CONTRAST:  46mL GADAVIST GADOBUTROL 1 MMOL/ML IV SOLN COMPARISON:  03/26/2021 FINDINGS: Brain: Sequelae of interval right frontal craniotomy and mass resection are identified. There is a resection cavity in the right basal ganglia containing blood products with surgical tract extending through the anterolateral frontal lobe. There is a small amount of partially nodular intrinsic T1 hyperintensity along the margins of the resection cavity suggesting blood products. Faint enhancement is also present along the periphery of the resection cavity. There is a 1.8 cm region of intensely restricted diffusion immediately superior to the resection cavity involving portions of the right corona radiata and caudate nucleus compatible with an acute infarct. Small volume pneumocephalus is noted, and there is a small extra-axial fluid collection over the right frontal convexity subjacent to the craniotomy measuring 7 mm in thickness. There is moderate residual edema throughout the right basal ganglia region extending into the frontal greater than temporal lobes which has decreased from the prior study. 5 mm of leftward midline shift is unchanged. A 5 mm lesion in the right caudate head demonstrating both intrinsic T1 hyperintensity and enhancement is unchanged (series 10, image 33). The smaller enhancing focus in the right caudate nucleus slightly more inferiorly on the prior MRI is less conspicuous on today's study. There is 6 mm heterogeneously enhancing nodular focus in the choroid plexus in the left lateral ventricle at the level of the inferior atrium/posterior temporal horn (series 10, image 26) which is asymmetric compared to  the contralateral side. This is unchanged in size from the prior MRI, however there is new susceptibility artifact associated with this lesion consistent with hemorrhage, and there is new trace intraventricular hemorrhage layering in the occipital horn of the left lateral ventricle. Scattered small T2 hyperintensities in the cerebral white matter bilaterally are nonspecific but compatible with mild chronic small vessel ischemic disease. There is mild cerebral atrophy. Vascular: Major intracranial vascular flow voids are preserved. Skull and upper cervical spine: Right frontal craniotomy with gas and fluid in the scalp. Sinuses/Orbits: Bilateral cataract extraction. Paranasal sinuses and mastoid air cells are clear. Other: None. IMPRESSION: 1. Postoperative changes from interval right basal ganglia tumor resection as above. This will serve as a baseline for future examinations. 2. 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 3. Decreased edema in the right basal ganglia region. Unchanged 5 mm a midline shift. 4. Unchanged 5 mm right caudate lesion. 5. 6 mm nodular hemorrhagic lesion in the choroid plexus of the left lateral ventricle suspicious for a metastasis with trace intraventricular hemorrhage. Electronically Signed   By: Logan Bores M.D.   On: 04/01/2021 15:09   MR BRAIN W WO CONTRAST  Result Date: 03/27/2021 CLINICAL DATA:  Follow-up examination for brain mass. EXAM: MRI HEAD WITHOUT AND WITH CONTRAST TECHNIQUE: Multiplanar, multiecho pulse sequences of the brain and surrounding structures were obtained without and with intravenous contrast. CONTRAST:  7.9mL GADAVIST GADOBUTROL 1 MMOL/ML IV SOLN COMPARISON:  Recent brain MRI from 03/25/2021. FINDINGS: Brain: Previously identified hemorrhagic mass centered at the right basal ganglia again seen, not significantly changed measuring 3.3 x 3.2 x 2.6 cm on this exam. Scattered areas of intrinsic T1 hyperintensity with susceptibility artifact  consistent with hemorrhage. Few subcentimeter nodular foci of enhancement seen involving the adjacent right caudate and basal ganglia favored to reflect small satellite lesions rather than subacute ischemia (  series 9, images 128, 122). The primary mass both involves and splays the surrounding radiating white matter tracts. Prominent surrounding T2/FLAIR signal abnormality throughout the adjacent right frontotemporal region. Mass effect on the adjacent right lateral ventricle which is partially effaced. Associated right-to-left shift measures up to 5 mm. No hydrocephalus or trapping. Basilar cisterns remain patent. No other mass lesion or pathologic enhancement. Underlying mild chronic microvascular ischemic disease. No other evidence for acute or chronic intracranial hemorrhage. No extra-axial collection. Vascular: Major intracranial vascular flow voids are maintained. Skull and upper cervical spine: Craniocervical junction within normal limits. Bone marrow signal intensity normal. No focal marrow replacing lesion. No scalp soft tissue abnormality. Sinuses/Orbits: Patient status post bilateral ocular lens replacement. Globes orbital soft tissues demonstrate no acute finding. Paranasal sinuses are clear. No mastoid effusion. Inner ear structures within normal limits. Other: None. IMPRESSION: 1. Stable size and appearance of hemorrhagic mass centered at the right basal ganglia. Few adjacent nodular foci of enhancement at the right caudate have an appearance more typical of small satellite lesions rather than subacute ischemia on this exam. Given this, a primary CNS neoplasm would be favored, although an intracranial hemorrhagic metastasis remains on the differential. 2. Surrounding vasogenic edema with up to 5 mm of right-to-left shift, stable. 3. Examination will be used for intraoperative guidance purposes. Electronically Signed   By: Jeannine Boga M.D.   On: 03/27/2021 01:30   MR Brain W and Wo  Contrast  Result Date: 03/25/2021 CLINICAL DATA:  Metastatic disease evaluation. Headaches and balance issues for 3 weeks. Found to have intracranial hemorrhage. EXAM: MRI HEAD WITHOUT AND WITH CONTRAST TECHNIQUE: Multiplanar, multiecho pulse sequences of the brain and surrounding structures were obtained without and with intravenous contrast. CONTRAST:  61mL GADAVIST GADOBUTROL 1 MMOL/ML IV SOLN COMPARISON:  Same day CT head. FINDINGS: Brain: There is a solidly enhancing hemorrhagic mass centered in the right basal ganglia/frontal lobe that measures up to 3.4 x 3.8 by 2.9 cm (AP by transverse by craniocaudal). There are a few areas of intrinsic T1 hyperintensity within the lateral and medial aspect of the lesion and marked susceptibility artifact throughout the lesion. There is exuberant surrounding vasogenic edema with mass effect. There is partial effacement of the right lateral ventricle anteriorly and approximately 5 mm of leftward midline shift near the foramina [place]. No definite evidence of hydrocephalus. Basal cisterns are patent. No extra-axial fluid collection. Enhancing 6 mm focus of restricted diffusion anteromedial to the mass in the region of the caudate, likely an acute or eary subacute infarct. Additional patchy white matter T2/FLAIR hyperintensities likely represent chronic microvascular ischemic disease. No definite other enhancing lesions identified. Vascular: Major arterial flow voids are maintained at the skull base. Incidental high left parietal developmental venous anomaly. Skull and upper cervical spine: Normal marrow signal. Sinuses/Orbits: Clear sinuses.  Unremarkable orbits. Other: No mastoid effusions. IMPRESSION: 1. Large, 3.8 cm enhancing and hemorrhagic mass within the right basal ganglia/frontal lobe, which is concerning for a metastasis in this patient with a history of melanoma. High-grade glioma is a differential consideration. 2. Exuberant surrounding vasogenic edema and mass  effect with partial effacement of the right lateral ventricle and approximately 5 mm of leftward midline shift. 3. Enhancing 6 mm focus of restricted diffusion anteromedial to the mass in the region of the caudate, likely an acute or eary subacute infarct. Recommend attention on follow-up to exclude an additional lesion. 4. No other enhancing lesions identified. Electronically Signed   By: Margaretha Sheffield MD  On: 03/25/2021 12:16   CT CHEST ABDOMEN PELVIS W CONTRAST  Result Date: 03/26/2021 CLINICAL DATA:  Brain mass or lesion, unknown etiology EXAM: CT CHEST, ABDOMEN, AND PELVIS WITH CONTRAST TECHNIQUE: Multidetector CT imaging of the chest, abdomen and pelvis was performed following the standard protocol during bolus administration of intravenous contrast. CONTRAST:  67mL OMNIPAQUE IOHEXOL 300 MG/ML  SOLN COMPARISON:  None. FINDINGS: CT CHEST FINDINGS Cardiovascular: Normal heart size. No significant pericardial effusion. The thoracic aorta is normal in caliber. Mild atherosclerotic plaque of the thoracic aorta. At least 2 vessel coronary artery calcifications. The main pulmonary artery is normal in caliber. No central or proximal segmental pulmonary embolus. Mediastinum/Nodes: There is an enlarged 1.6 cm left hilar lymph node. No enlarged mediastinal or axillary lymph nodes. Thyroid gland, trachea, and esophagus demonstrate no significant findings. Tiny hiatal hernia. Lungs/Pleura: There is a round 1 cm right upper lobe pulmonary nodule (5:26). There is a subsolid 1.3 x 1.2 cm left upper lobe pulmonary nodule (5:52). Associated left upper lobe bronchial wall thickening. Musculoskeletal: No chest wall abnormality. No suspicious lytic or blastic osseous lesions. No acute displaced fracture. Multilevel degenerative changes of the spine. T4 vertebral body hemangioma. CT ABDOMEN PELVIS FINDINGS Hepatobiliary: The liver is enlarged measuring up to 18 cm. Several scattered hypodense subcentimeter hepatic lesions  too small to characterize. No gallstones, gallbladder wall thickening, or pericholecystic fluid. No biliary dilatation. Pancreas: No focal lesion. Normal pancreatic contour. No surrounding inflammatory changes. No main pancreatic ductal dilatation. Spleen: Normal in size without focal abnormality. Adrenals/Urinary Tract: No left adrenal nodularity. There is a 8.1 x 5.3 by 8 cm heterogeneous right upper quadrant lesion likely arising from the right adrenal gland that is noted to abut the right hepatic lobe (3:55) as well as the inferior vena cava (3:62). The lesion leads to mass effect on the inferior vena cava (7:55). Bilateral kidneys enhance symmetrically. Subcentimeter hypodensities are too small to characterize. No hydronephrosis. No hydroureter. The urinary bladder is unremarkable. Stomach/Bowel: Stomach is within normal limits. No evidence of bowel wall thickening or dilatation. Scattered colonic diverticulosis. Appendix appears normal. Vascular/Lymphatic: No abdominal aorta or iliac aneurysm. Mild atherosclerotic plaque of the aorta and its branches. Enlarged 1.1 cm mesenteric lymph node (3:84). Other lymph nodes are prominent but not enlarged. No pelvic, or inguinal lymphadenopathy. Reproductive: Prostate is unremarkable. Other: No intraperitoneal free fluid. No intraperitoneal free gas. No organized fluid collection. Musculoskeletal: No abdominal wall hernia or abnormality. No suspicious lytic or blastic osseous lesions. No acute displaced fracture. Grade 1 anterolisthesis of L5 on S1. Bilateral L5 pars interarticularis defects. IMPRESSION: 1. A 1.3 cm subsolid left upper lobe pulmonary nodule with associated bronchial wall thickening and possible endoluminal component. 2. Left hilar lymphadenopathy. 3. A 1 cm right upper lobe solid pulmonary nodule. 4. A 8.1 x 5.3 cm right upper quadrant mass likely arising from the right adrenal gland. The mass abuts the right hepatic lobe as well as the right inferior  cava leading to mass effect/narrowing of the IVC. 5. Enlarged 1.1 cm mesenteric lymph node. 6. Several scattered hypodense subcentimeter hepatic lesions too small to characterize. 7. Other imaging findings of potential clinical significance: Tiny hiatal hernia. Scattered colonic diverticulosis with no acute diverticulitis. 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 Aortic Atherosclerosis (ICD10-I70.0) including coronary artery calcifications. These results will be called to the ordering clinician or representative by the Radiologist Assistant, and communication documented in the PACS or [employer]. Electronically Signed  By: Iven Finn M.D.   On: 03/26/2021 00:30   CT BIOPSY  Result Date: 03/30/2021 INDICATION: 75 year old gentleman with history of melanoma presents to interventional radiology for biopsy of right adrenal mass. EXAM: CT-guided biopsy of right adrenal mass MEDICATIONS: None. ANESTHESIA/SEDATION: Moderate (conscious) sedation was employed during this procedure. A total of Versed 1 mg and Fentanyl 25 mcg was administered intravenously. Moderate Sedation Time: 17 minutes. The patient's level of consciousness and vital signs were monitored continuously by radiology nursing throughout the procedure under my direct supervision. COMPLICATIONS: SIR Level A - No therapy, no consequence. PROCEDURE: Informed written consent was obtained from the patient after a thorough discussion of the procedural risks, benefits and alternatives. All questions were addressed. Maximal Sterile Barrier Technique was utilized including caps, mask, sterile gowns, sterile gloves, sterile drape, hand hygiene and skin antiseptic. A timeout was performed prior to the initiation of the procedure. Patient position right lateral decubitus on the CT table. Following local lidocaine administration, 17 gauge introducer needle was advanced into the right adrenal mass utilizing CT  guidance. 4-18 gauge cores were obtained. Samples were sent to pathology in formalin. Postprocedure CT demonstrated mild retroperitoneal hematoma which did not significantly increased in size on sequential CT. No pneumothorax identified on postprocedure CT. IMPRESSION: CT-guided biopsy of right adrenal mass. Electronically Signed   By: Miachel Roux M.D.   On: 03/30/2021 12:56   DG Chest Portable 1 View  Result Date: 03/25/2021 CLINICAL DATA:  Unsteadiness, headaches, imbalance, intracranial hemorrhage EXAM: PORTABLE CHEST 1 VIEW COMPARISON:  Portable exam 1005 hours compared to 03/11/2013 FINDINGS: Normal heart size, mediastinal contours, and pulmonary vascularity. Atherosclerotic calcification aorta. Lungs clear. No pulmonary infiltrate, pleural effusion or pneumothorax. Probable small enchondroma proximal LEFT humerus. Osseous demineralization. IMPRESSION: No acute abnormalities. Aortic Atherosclerosis (ICD10-I70.0). Electronically Signed   By: Lavonia Dana M.D.   On: 03/25/2021 10:25    Labs:  Basic Metabolic Panel: Recent Labs  Lab 04/12/21 0600 04/13/21 0558 04/14/21 0510 04/16/21 0511  NA 133* 132* 135 130*  K 3.4* 3.5 4.0 3.7  CL 98 97* 98 92*  CO2 28 28 28 26   GLUCOSE 94 96 99 116*  BUN 11 9 8  6*  CREATININE 0.62 0.71 0.70 0.55*  CALCIUM 8.7* 9.1 8.8* 9.6  MG  --  1.9 2.0  --     CBC: Recent Labs  Lab 04/12/21 0600 04/13/21 0558 04/16/21 0511 04/17/21 1143  WBC 6.1 4.6 4.8 6.7  NEUTROABS 4.3 3.0  --  5.0  HGB 12.3* 13.3 14.3 16.5  HCT 35.1* 38.3* 40.8 46.3  MCV 90.0 89.9 90.7 89.0  PLT 166 171 201 224    CBG: No results for input(s): GLUCAP in the last 168 hours.  Family history.  Mother with colon polyps and hypertension Father with diabetes.  Denies any colon cancer or stomach cancer or esophageal cancer or pancreatic cancer  Brief HPI:   Shannon Shinault. is a 75 y.o. right-handed male with history of glaucoma followed by Dr. Satira Sark, hypertension tobacco use  remote melanoma x2 removed from right calf and left flank 2011 and negative nodal biopsy.  Per chart review lives with spouse two-level home independent with assistive device prior to admission with reported multiple falls over the past 6 weeks.  Presented 03/25/2021 with persistent headache and balance deficits x6 weeks left hemiparesis as well as shuffling gait.  MRI of the brain showed a 3.8 cm enhancing and hemorrhagic mass in the right basal ganglia frontal lobe  region with a 5 mm left midline shift.  CT of the chest abdomen was notable for left upper pulmonary nodule, left hilar adenopathy right upper lobe pulmonary nodule left lower quadrant mass arising from the right adrenal gland, hepatic lesions.  CT-guided biopsy of the adrenal mass 03/30/2021 positive for malignancy.  Patient underwent right craniotomy resection of tumor 03/31/2021 per Dr. Reatha Armour.  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  Brain surgical tissue pathology remain pending with follow-up oncology services Dr.Gudena as well as Dr. Alen Blew.  Decadron protocol as indicated.  Maintained on Keppra for seizure prophylaxis.  Patient cleared to begin subcutaneous heparin for DVT prophylaxis 04/02/2021.  Therapy evaluations completed due to patient decreased functional ability left side weakness was admitted for a comprehensive rehab program.   Hospital Course: Neng Albee. was admitted to rehab 04/06/2021 for inpatient therapies to consist of PT, ST and OT at least three hours five days a week. Past admission physiatrist, therapy team and rehab RN have worked together to provide customized collaborative inpatient rehab.  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  Follow-up per oncology services.  Pathology positive for malignant melanoma.  Patient was maintained on subcutaneous heparin for DVT  prophylaxis mood stabilization with trial of Remeron as well as Xanax as needed.  Bouts of hypokalemia/ Hyponatremia and had been maintained on IV fluids for a short time.  Blood pressure soft monitored.  Patient with altered mental status continued to be somewhat transient urine cloudy suspect UTI question recurrent pneumonia completed a course of 14 doses of cefepime.  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  No definite acute ventriculomegaly.  Due to these altered mental status changes neurosurgery follow-up and patient was discharged to 4 N. for ongoing medical care 04/18/2021   Blood pressures were monitored on TID basis and soft and monitored     Rehab course: During patient's stay in rehab weekly team conferences were held to monitor patient's progress, set goals and discuss barriers to discharge. At admission, patient required minimal assist under 20 feet rolling walker minimal assist sit to stand.  Set up eating set up upper body bathing mod assist lower body bathing moderate assist upper body dressing moderate assist lower body dressing  Physical exam.  Blood pressure 115/60 pulse 50 temperature 96.9 respirations 18 oxygen saturation 91% room air Constitutional.  No acute distress HEENT Head.  Craniotomy site clean and dry Eyes.  EOMs intact decreased visual acuity OD Neck.  Supple nontender no JVD without thyromegaly Cardiac regular rate rhythm not extra sounds or murmur heard Abdomen.  Soft nontender positive bowel sounds without rebound Respiratory effort normal no respiratory distress without wheeze Neurologic.  Alert makes eye contact with examiner provides name and age somewhat delay in processing follows commands.  Right upper extremity 4+/5.  Left upper extremity 4/5.  Right right lower extremity 4/5 proximal to distal.  Decreased fine motor on the  left.  Mild left inattention.  Fair insight and awareness.  He/She  has had improvement in activity tolerance, balance, postural control as well as ability to compensate for deficits. He/She has had improvement in functional use RUE/LUE  and RLE/LLE as well as improvement in awareness.  Supine to sit transfer supervision donning pants and shoes edge of bed max assist sit to  stand minimal assist to pull pants to waist.  Gait training 2x30 with mod max cues and facilitation to improve trunk posture.  Minimal assist upper body bathing minimal assist lower body bathing minimal assist upper dressing mod assist lower body dressing       Disposition: Discharged to acute care services    Diet: Regular  Special Instructions: No driving smoking or alcohol  Medications at time of discharge to acute services 1.  Tylenol as needed 2.  Xanax 0.25 mg 3 times daily as needed 3.  Norvasc 10 mg p.o. daily 4.Azopt ophthalmic solution 1 drop both eyes twice daily 5.  Rocephin 1 g daily 6.  Pepcid 20 mg p.o. daily 7.  Subcutaneous heparin 5000 units every 12 hours 8.  Avapro 300 mg p.o. daily 9.   Xalatan 1 drop both eyes bedtime 10.  Keppra 500 mg p.o. twice daily 11.  Magnesium gluconate 250 mg p.o. nightly 12.  Remeron 7.5 mg p.o. nightly 13.  Protonix 40 mg p.o. nightly 14.  Klor-Con 40 mg p.o. daily 15.  Vitamin D 50,000 units every 7 days  30-35 minutes were spent completing discharge summary and discharge planning    Follow-up Information     [name], MD Follow up.   Specialty: Physical Medicine and Rehabilitation Why: Follow-up as directed Contact information: [place] [address] [phone]         Dawley, Pieter Partridge C, DO Follow up.   Why: Call for appointment Contact information: [place] [address] [phone]         Wyatt Portela, MD Follow up.   Specialty: Oncology Why: Call for  appointment Contact information: [place] [address] [phone]         Nicholas Lose, MD Follow up.   Specialty: Hematology and Oncology Why: Call for appointment Contact information: [place] [address] [phone]                 Signed: Lavon Paganini [place] 04/18/2021, 1:15 PM

## 2021-04-18 NOTE — Progress Notes (Signed)
Physical Therapy Session Note  Patient Details  Name: Shannon Horton. MRN: [id number] Date of Birth: January 02, 1946  Today's Date: 04/18/2021  Short Term Goals: Week 2:  PT Short Term Goal 1 (Week 2): Patient will perform sit to from stand with CGA consistently using LRAD. PT Short Term Goal 2 (Week 2): Pat[place] will perform sitting balance with supervision consistently. PT Short Term Goal 3 (Week 2): Patient with ambulating >200 ft with CGA with only 2 cues for safe proximity to RW. PT Short Term Goal 4 (Week 2): Patient will perform 1 step using LRAD without rails with min A.  Skilled Therapeutic Interventions/Progress Updates:     Patient in bed asleep upon PT arrival. Very difficult to arouse, breathing deeply. He did open his eyes x2 to tactile and verbal stimulation without verbal response. When asked if he was okay, patient nodded yes without opening his eyes and returned to sleep immediately following. Vitals: BP 138/74, HR 71, SPO2 94%. Noted HR typically in the 50's for this patient. Discussed reduced arousal and HR with LPN, who reports that this is consistent since yesterday and that antibiotics have been started for UTI. Patient missed 60 min of skilled PT due to lethargy, LPN made aware. Will attempt to make-up missed time as able.    Therapy Documentation Precautions:  Precautions Precautions: Fall Precaution Comments: R crani Restrictions Weight Bearing Restrictions: No General: PT Amount of Missed Time (min): 60 Minutes PT Missed Treatment Reason: Patient fatigue (difficult to arouse)    Therapy/Group: Individual Therapy  Fergus Throne L Analise Glotfelty PT, DPT  04/18/2021, 9:31 AM

## 2021-04-18 NOTE — Progress Notes (Signed)
Informed NSG regarding no urine output for 3 hours, order for 1L NS fluid bolus at this time.

## 2021-04-18 NOTE — Progress Notes (Signed)
Patient wife notified this nurse of reduction in level of consciousness this nurse notified dr, Read D[employer], who was on call after pt required in and out catheterization for urinary retention UA C&S was collected and sent to lab with results significant for UTI. New orders received for Iv fluids and Ceftriaxone 1 gm IV Q 24 hours. Patient monitored for increased blood pressure with vitals Q 4 hours x 3 with blood pressures in the 270-350'K systolic. MD notified of increase

## 2021-04-18 NOTE — Progress Notes (Signed)
Discussed with pt's wife the decision to transfer back to acute care setting for closer monitoring and w/u.  Discussed that rehab consult team will monitor and determine return to rehab once further work up and tratement complete.

## 2021-04-18 NOTE — Progress Notes (Addendum)
Called by pt's wife via our answering service, asked me to evaluate the patient. He is on CTX for a suspected UTI, on exam he is obtunded, barely opens eyes to painful stim but does not respond, is able to reliably follow some simple commands and reliably squeeze / release to command with both hands and move BLE to command. PERRL with conjugate gaze, unable to assess facial symmetry / other CN function due to depressed mental status.  -will get a repeat CTH to r/o obvious structural pathology, given the melanoma he could be developing leptomeningeal disease and resulting hydrocephalus. He's afebrile, normotensive to hypertensive with normal HR, does not appear to have frank urosepsis. Will defer to primary team given he's under their care at this time, but if his mental status continues to be this poor I suspect he'll require transfer back to inpatient for further workup.  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 While there was some blood on his prior MRI in the L occipital horn, it was resolved by his most recent CT, so likely repeat hemorrhage. Discussed w/ Dr. Letta Pate, will transfer over to inpatient for further workup, will need a 4NP or 4N ICU bed.

## 2021-04-19 ENCOUNTER — Inpatient Hospital Stay (HOSPITAL_COMMUNITY): Payer: Medicare PPO

## 2021-04-19 LAB — COMPREHENSIVE METABOLIC PANEL
ALT: 38 U/L (ref 0–44)
AST: 20 U/L (ref 15–41)
Albumin: 2.6 g/dL — ABNORMAL LOW (ref 3.5–5.0)
Alkaline Phosphatase: 53 U/L (ref 38–126)
Anion gap: 7 (ref 5–15)
BUN: 16 mg/dL (ref 8–23)
CO2: 26 mmol/L (ref 22–32)
Calcium: 8.4 mg/dL — ABNORMAL LOW (ref 8.9–10.3)
Chloride: 95 mmol/L — ABNORMAL LOW (ref 98–111)
Creatinine, Ser: 0.63 mg/dL (ref 0.61–1.24)
GFR, Estimated: >60 mL/min (ref >60)
Glucose, Bld: 99 mg/dL (ref 70–99)
Potassium: 3 mmol/L — ABNORMAL LOW (ref 3.5–5.1)
Sodium: 128 mmol/L — ABNORMAL LOW (ref 135–145)
Total Bilirubin: 1.1 mg/dL (ref 0.3–1.2)
Total Protein: 4.6 g/dL — ABNORMAL LOW (ref 6.5–8.1)

## 2021-04-19 LAB — TSH: TSH: 2.112 u[IU]/mL (ref 0.350–4.500)

## 2021-04-19 LAB — LACTATE DEHYDROGENASE: LDH: 186 U/L (ref 98–192)

## 2021-04-19 LAB — AMMONIA: Ammonia: 25 umol/L (ref 9–35)

## 2021-04-19 MED ORDER — GADOBUTROL 1 MMOL/ML IV SOLN
7.0000 mL | Freq: Once | INTRAVENOUS | Status: AC | PRN
  Administered 2021-04-19: 7 mL via INTRAVENOUS

## 2021-04-19 MED ORDER — SODIUM CHLORIDE 0.9 % IV BOLUS: 1000.0000 mL | Freq: Once | INTRAVENOUS | Status: DC

## 2021-04-19 MED ORDER — DEXAMETHASONE SODIUM PHOSPHATE 4 MG/ML IJ SOLN
1.0000 mg | INTRAMUSCULAR | Status: DC
  Administered 2021-04-19 – 2021-04-20 (×2): 1 mg via INTRAVENOUS
  Filled 2021-04-19 (×2): qty 1

## 2021-04-19 MED ORDER — SODIUM CHLORIDE 1 G PO TABS
2.0000 g | ORAL_TABLET | Freq: Two times a day (BID) | ORAL | Status: DC
  Administered 2021-04-19 – 2021-04-21 (×4): 2 g via ORAL
  Filled 2021-04-19 (×5): qty 2

## 2021-04-19 MED ORDER — POTASSIUM CHLORIDE 10 MEQ/100ML IV SOLN
10.0000 meq | INTRAVENOUS | Status: AC
  Administered 2021-04-19 (×4): 10 meq via INTRAVENOUS
  Filled 2021-04-19 (×4): qty 100

## 2021-04-19 NOTE — Progress Notes (Signed)
Updated the wife via phone, answered her questions. She will be by this afternoon. He called her this morning with the help of his son.     Elwin Sleight, DO Neurosurgeon

## 2021-04-19 NOTE — Progress Notes (Signed)
Inpatient Rehabilitation Care Coordinator Discharge Note  The overall goal for the admission was met for:   Discharge location: D/c to acute hospital due to medical reasons.   Length of Stay: 12 days.  Discharge activity level: Min A- Max A  Home/community participation: Yes  Services provided included: MD, RD, PT, OT, SLP, RN, CM, TR, Pharmacy, Neuropsych, and SW  Financial Services: Private Insurance: [employer]  Choices offered to/list presented to:N/A  Follow-up services arranged: N/A  Comments (or additional information):  Patient/Family verbalized understanding of follow-up arrangements: Yes  Individual responsible for coordination of the follow-up plan: contact Alice #[phone]  Confirmed correct DME delivered: Rana Snare 04/19/2021    Rana Snare

## 2021-04-19 NOTE — Plan of Care (Signed)
Unplanned discharge back to ICU.  Dorthula Nettles, RN3, BSN, CBIS, [place], [facility], Inpatient Rehabilitation Office [phone] Cell [phone]

## 2021-04-19 NOTE — H&P (Signed)
Providing Compassionate, Quality Care - Together  NEUROSURGERY HISTORY & PHYSICAL   Shannon Horton. is an 75 y.o. male.   Chief Complaint: Altered mental status HPI: This is a 75 year old male with a history of metastatic melanoma, status post right frontal craniotomy for resection of tumor on 03/31/2021.  Postoperatively he did well and ultimately went to inpatient rehab.  Yesterday during inpatient rehab, his wife was concerned with his lethargy and unresponsiveness and therefore called our team to evaluate him.  He was not at his normal neurologic baseline, rather confused but intermittently following commands and lethargic.  Due to this he was transferred for work-up and evaluation back to the hospital.  Is currently on treatment for pneumonia and urinary infection with cefepime.  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  There is no overt hydrocephalus.  MRI brain revealed no acute infection, stable postoperative cavity with again left occipital ventricular hemorrhage that is nonobstructive.  Past Medical History:  Diagnosis Date   Allergic rhinitis    gets shots per Dr. Velora Heckler   Allergy    sees Dr. Harold Hedge    Asthma    Glaucoma    sees Dr. Satira Sark    Hypertension    Malignant melanoma [facility])    right lower leg, diagnosed on1/21/11    Past Surgical History:  Procedure Laterality Date   APPLICATION OF CRANIAL NAVIGATION N/A 03/31/2021   Procedure: APPLICATION OF CRANIAL NAVIGATION;  Surgeon: Karsten Ro, DO;  Location: [place];  Service: Neurosurgery;  Laterality: N/A;   CATARACT EXTRACTION     right ey, Dr. Satira Sark    COLONOSCOPY  12/15/2015   per Dr.Kaplan, clear, no repeats    CRANIOTOMY N/A 03/31/2021   Procedure: RIGHT FRONTAL CRANIOTOMY FOR TUMOR;  Surgeon: Karsten Ro, DO;  Location: [place];  Service: Neurosurgery;  Laterality: N/A;   ESOPHAGOGASTRODUODENOSCOPY     WITH  ESOPHAGEAL DILATION 2006 PER dR. kAPLAN   REMOVAL  of melanoma  12/08/09   from right calf; per Dr. Monica Becton   removal of melanoma from left abdomen  01/21/10    per Dr. Denice Bors CELL CARCINOMA EXCISION  2013   per Dr. Terrence Dupont, from top of scalp     Family History  Problem Relation Age of Onset   Hypertension Other    Colon polyps Mother    Hypertension Mother    Diabetes Father    Liver disease Maternal Uncle    Cancer Brother        throat and lung (smoker)   Colon cancer Neg Hx    Stomach cancer Neg Hx    Esophageal cancer Neg Hx    Pancreatic cancer Neg Hx    Social History:  reports that he has quit smoking. He has never used smokeless tobacco. He reports current alcohol use of about 5.0 - 10.0 standard drinks of alcohol per week. He reports that he does not use drugs.  Allergies:  Allergies  Allergen Reactions   Banana Rash    Medications Prior to Admission  Medication Sig Dispense Refill   albuterol (PROVENTIL HFA;VENTOLIN HFA) 108 (90 Base) MCG/ACT inhaler Inhale 2 puffs into the lungs every 4 (four) hours as needed for wheezing or shortness of breath. 8.5 Inhaler 0   amLODipine (NORVASC) 5 MG tablet Take 1 tablet (5 mg total) by mouth daily. 90 tablet 3   bimatoprost (LUMIGAN) 0.03 %  ophthalmic solution Place 1 drop into both eyes at bedtime.     brinzolamide (AZOPT) 1 % ophthalmic suspension Place 1 drop into both eyes 2 (two) times daily.     dexamethasone (DECADRON) 1 MG tablet Take 1 tablet (1 mg total) by mouth every 12 (twelve) hours. 6 tablet 0   Fluticasone Furoate (ARNUITY ELLIPTA) 200 MCG/ACT AEPB Inhale 1 puff into the lungs daily.     HYDROcodone-acetaminophen (NORCO/VICODIN) 5-325 MG tablet Take 1 tablet by mouth every 4 (four) hours as needed for moderate pain. 30 tablet 0   levETIRAcetam (KEPPRA) 500 MG tablet Take 1 tablet (500 mg total) by mouth 2 (two) times daily. 60 tablet 1   pantoprazole (PROTONIX) 40 MG tablet Take 1 tablet (40 mg  total) by mouth at bedtime. 30 tablet 0   telmisartan-hydrochlorothiazide (MICARDIS HCT) 80-25 MG tablet TAKE 1 TABLET BY MOUTH EVERY DAY 90 tablet 3    Results for orders placed or performed during the hospital encounter of 04/18/21 (from the past 48 hour(s))  CBC with Differential/Platelet     Status: Abnormal   Collection Time: 04/18/21 11:32 PM  Result Value Ref Range   WBC 5.7 4.0 - 10.5 K/uL   RBC 4.24 4.22 - 5.81 MIL/uL   Hemoglobin 13.3 13.0 - 17.0 g/dL   HCT 37.7 (L) 39.0 - 52.0 %   MCV 88.9 80.0 - 100.0 fL   MCH 31.4 26.0 - 34.0 pg   MCHC 35.3 30.0 - 36.0 g/dL   RDW 12.2 11.5 - 15.5 %   Platelets 190 150 - 400 K/uL   nRBC 0.0 0.0 - 0.2 %   Neutrophils Relative % 77 %   Neutro Abs 4.4 1.7 - 7.7 K/uL   Lymphocytes Relative 14 %   Lymphs Abs 0.8 0.7 - 4.0 K/uL   Monocytes Relative 7 %   Monocytes Absolute 0.4 0.1 - 1.0 K/uL   Eosinophils Relative 1 %   Eosinophils Absolute 0.0 0.0 - 0.5 K/uL   Basophils Relative 0 %   Basophils Absolute 0.0 0.0 - 0.1 K/uL   Immature Granulocytes 1 %   Abs Immature Granulocytes 0.06 0.00 - 0.07 K/uL    Comment: Performed at [facility], [address], [place], [place] [address]  Comprehensive metabolic panel     Status: Abnormal   Collection Time: 04/18/21 11:32 PM  Result Value Ref Range   Sodium 128 (L) 135 - 145 mmol/L   Potassium 3.0 (L) 3.5 - 5.1 mmol/L   Chloride 95 (L) 98 - 111 mmol/L   CO2 26 22 - 32 mmol/L   Glucose, Bld 99 70 - 99 mg/dL    Comment: Glucose reference range applies only to samples taken after fasting for at least 8 hours.   BUN 16 8 - 23 mg/dL   Creatinine, Ser 0.63 0.61 - 1.24 mg/dL   Calcium 8.4 (L) 8.9 - 10.3 mg/dL   Total Protein 4.6 (L) 6.5 - 8.1 g/dL   Albumin 2.6 (L) 3.5 - 5.0 g/dL   AST 20 15 - 41 U/L   ALT 38 0 - 44 U/L   Alkaline Phosphatase 53 38 - 126 U/L   Total Bilirubin 1.1 0.3 - 1.2 mg/dL   GFR, Estimated >60 >60 mL/min    Comment: (NOTE) Calculated using the CKD-EPI Creatinine  Equation (2021)    Anion gap 7 5 - 15    Comment: Performed at [facility], [place] [address] [place], [place] [address]  TSH     Status: None   Collection Time: 04/18/21 11:32 PM  Result Value Ref Range   TSH 2.112 0.350 - 4.500 uIU/mL    Comment: Performed by a 3rd Generation assay with a functional sensitivity of <=0.01 uIU/mL. Performed at [facility], [place] [address] [place], [place] [address]   Ammonia     Status: None   Collection Time: 04/18/21 11:32 PM  Result Value Ref Range   Ammonia 25 9 - 35 umol/L    Comment: Performed at [facility], [place] [address], [place], [place] [address]  Lactate dehydrogenase     Status: None   Collection Time: 04/18/21 11:32 PM  Result Value Ref Range   LDH 186 98 - 192 U/L    Comment: Performed at [facility], [place] [address], [place], [place] [address]   DG Chest 2 View  Result Date: 04/17/2021 CLINICAL DATA:  Cough EXAM: CHEST - 2 VIEW COMPARISON:  04/10/2021, CT from 03/25/2021 FINDINGS: Cardiac shadow is stable. Mild tortuous thoracic aorta is again seen. Bibasilar airspace opacity is noted slightly improved when compared with the prior exam. Fullness of the right hilum is noted related to vessel on end. Stable right upper lobe nodule is noted seen on previous CT. The known left upper lobe nodule is not as well appreciated on this exam as on prior CT. No sizable effusion is seen. No acute bony abnormality is noted. IMPRESSION: Improving bibasilar airspace opacity. Stable right upper lobe nodule unchanged from prior CT. The known left upper lobe sub solid nodule is not as well appreciated on this exam. Electronically Signed   By: Inez Catalina M.D.   On: 04/17/2021 15:33   CT HEAD WO CONTRAST  Addendum Date: 04/18/2021   ADDENDUM REPORT: 04/18/2021 11:50 ADDENDUM: Study discussed by telephone with Dr. Emelda Brothers on 04/18/2021 at 1139 hours. Electronically Signed   By: Genevie Ann M.D.   On: 04/18/2021 11:50   Result  Date: 04/18/2021 CLINICAL DATA:  75 year old male with unexplained altered mental status. Right basal ganglia tumor resection earlier this month. Pathology revealing metastatic melanoma. EXAM: CT HEAD WITHOUT CONTRAST TECHNIQUE: Contiguous axial images were obtained from the base of the skull through the vertex without intravenous contrast. COMPARISON:  Postoperative head CT 04/09/2021 and earlier. FINDINGS: Brain: There is a small new volume of intraventricular hemorrhage layering within the left occipital horn on series 3, image 18. Right ventricle size appears slightly larger than before, but this might be related to decreased right hemisphere edema. No definite ventriculomegaly or transependymal edema. But no other acute intracranial hemorrhage is identified. Furthermore, MRI appearance on 04/01/2021 was suspicious for a small left choroid a plexus metastasis (same image as above). Stable CT appearance of postoperative changes in the right hemisphere tracking to the right lentiform. Regressed edema since 04/01/2021. No new intracranial mass or mass effect is evident. Normal basilar cisterns. No cortically based acute infarct identified. Vascular: Mild Calcified atherosclerosis at the skull base. No suspicious intracranial vascular hyperdensity. Skull: Stable right frontotemporal craniotomy. No acute osseous abnormality identified. Sinuses/Orbits: Visualized paranasal sinuses and mastoids are clear. Other: Regressed scalp postoperative changes. Orbits soft tissues remain negative. A small volume of right retro maxillary soft tissue gas has decreased and may be postoperative. IMPRESSION: 1. Positive for a small volume of acute left occipital horn IVH likely secondary to the small left choroid plexus metastasis suspected by MRI. No definite acute ventriculomegaly or transependymal edema. 2. Satisfactory noncontrast  CT postoperative appearance of the right lentiform resection cavity. Electronically Signed: By: Genevie Ann M.D. On: 04/18/2021 11:28   MR BRAIN W WO CONTRAST  Result Date: 04/19/2021 CLINICAL DATA:  Mental status change. EXAM: MRI HEAD WITHOUT AND WITH CONTRAST TECHNIQUE: Multiplanar, multiecho pulse sequences of the brain and surrounding structures were obtained without and with intravenous contrast. CONTRAST:  54mL GADAVIST GADOBUTROL 1 MMOL/ML IV SOLN COMPARISON:  CT head April 18, 2021.  MRI head April 01, 2021. FINDINGS: Brain: Postoperative changes of right frontal craniotomy and mass resection. Peripheral enhancement and restricted diffusion along the resection cavity likely represents devitalized tissue. Decreased surrounding edema and regional mass effect with decreased conspicuity of blood products and developing encephalomalacia. Resolution of previously seen midline shift. Mild increase in ventricular size relative to prior MRI. As seen on recent CT head, acute left occipital horn intraventricular hemorrhage. Enhancing left choroid plexus lesion, which appears slightly increased in size (approximately 8 mm on series 20, image 17, previously 6 mm when remeasured) with more homogeneous enhancement. Additional more focal area of enhancement and restricted diffusion in the more superomedial corona radiata/caudate correlates with previously seen acute infarct in is compatible with evolving/subacute infarct. This region of enhancement partially obscures the previously seen right caudate enhancing lesion. Additional mild to moderate scattered T2/FLAIR hyperintensities within the white matter, likely related to chronic microvascular ischemic disease. Vascular: Major arterial flow voids are maintained at the skull base. Skull and upper cervical spine: Normal marrow signal. Sinuses/Orbits: Largely clear sinuses.  Unremarkable orbits. Other: No mastoid effusions. IMPRESSION: 1. Acute left occipital horn intraventricular hemorrhage, similar to recent CT head and increased from prior MRI. 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 2. Mild increase in ventricular size relative to prior MRI, which is likely in part related to atrophy and decreasing mass effect from the resected right frontal lobe mass. In the setting of intraventricular hemorrhage, recommend follow-up CT to ensure stability and exclude developing hydrocephalus. 3. Evolving changes of right basal ganglia tumor resection with decreased surrounding edema and mass effect. Resolution of midline shift. Irregular enhancement along the periphery of the resection cavity may represent evolving postoperative changes but warrants attention on follow-up. 4. Increased enhancement in the region of recently seen infarct superomedial to the resection cavity, most likely enhancing subacute infarct given findings on the prior MRI. This limits assessment for progressive tumor in this region and partially obscures the right caudate lesion. Recommend attention on short interval follow-up to ensure resolution and to better assess. Electronically Signed   By: Margaretha Sheffield MD   On: 04/19/2021 08:09    ROS Unable to obtain, poor historian  Blood pressure 124/74, pulse 68, temperature 98.7 F (37.1 C), temperature source Axillary, resp. rate 16, SpO2 94 %.  Physical Exam  Awake alert oriented x2, unsure of why he is back in the hospital.  Does not remember any acute events yesterday. Speech is slow but fluent, appropriately answers questions. PERRLA EOMI Follows commands x4 Strength 4+/5 throughout Positive drift on the left Incision clean dry and intact, healing well, no erythema or fluctuance   Assessment/Plan 75 year old male with  Metastatic melanoma, status post right frontal craniotomy AMS, likely due to metabolic  causes Hyponatremia Hypokalemia  -Salt tab replacement -Wean off of steroids -Potassium replacement -PT/OT eval -Appears to be back  to normal neurologic baseline without acute deficits -We will stepdown to floor status -We will call his wife to update -Imaging reviewed   Thank you for allowing me to participate in this patient's care.  Please do not hesitate to call with questions or concerns.   Elwin Sleight, [facility] Cell: [phone]

## 2021-04-19 NOTE — Progress Notes (Signed)
Pt transferred to 4NP03 from 4NICU. Wife at bedside. Pt alert and oriented to self, date, year, hospital, and verbalized that he had a brain tumor. Did think he was in [facility]. Vital signs gotten by Beverlee Nims, NT on admission to unit. Full assessment documented at this time.  Justice Rocher, RN

## 2021-04-20 LAB — BASIC METABOLIC PANEL
Anion gap: 9 (ref 5–15)
BUN: 10 mg/dL (ref 8–23)
CO2: 26 mmol/L (ref 22–32)
Calcium: 9 mg/dL (ref 8.9–10.3)
Chloride: 96 mmol/L — ABNORMAL LOW (ref 98–111)
Creatinine, Ser: 0.56 mg/dL — ABNORMAL LOW (ref 0.61–1.24)
GFR, Estimated: >60 mL/min (ref >60)
Glucose, Bld: 133 mg/dL — ABNORMAL HIGH (ref 70–99)
Potassium: 3.3 mmol/L — ABNORMAL LOW (ref 3.5–5.1)
Sodium: 131 mmol/L — ABNORMAL LOW (ref 135–145)

## 2021-04-20 LAB — GLUCOSE, CAPILLARY: Glucose-Capillary: 132 mg/dL — ABNORMAL HIGH (ref 70–99)

## 2021-04-20 LAB — CALCIUM, IONIZED: Calcium, Ionized, Serum: 5 mg/dL (ref 4.5–5.6)

## 2021-04-20 MED ORDER — HEPARIN SODIUM (PORCINE) 5000 UNIT/ML IJ SOLN
5000.0000 [IU] | Freq: Two times a day (BID) | INTRAMUSCULAR | Status: DC
  Administered 2021-04-20 – 2021-04-21 (×2): 5000 [IU] via SUBCUTANEOUS
  Filled 2021-04-20 (×2): qty 1

## 2021-04-20 MED ORDER — LEVETIRACETAM 500 MG PO TABS
500.0000 mg | ORAL_TABLET | Freq: Two times a day (BID) | ORAL | Status: DC
  Administered 2021-04-20: 500 mg via ORAL
  Filled 2021-04-20: qty 1

## 2021-04-20 MED ORDER — PANTOPRAZOLE SODIUM 40 MG PO TBEC
40.0000 mg | DELAYED_RELEASE_TABLET | Freq: Every day | ORAL | Status: DC
  Administered 2021-04-20: 40 mg via ORAL
  Filled 2021-04-20: qty 1

## 2021-04-20 MED ORDER — POTASSIUM CHLORIDE 10 MEQ/100ML IV SOLN
10.0000 meq | INTRAVENOUS | Status: AC
  Administered 2021-04-20 (×4): 10 meq via INTRAVENOUS
  Filled 2021-04-20 (×4): qty 100

## 2021-04-20 NOTE — PMR Pre-admission (Signed)
PMR Admission Coordinator Pre-Admission Assessment  Patient: Shannon Horton. is an 75 y.o., male MRN: [id number] DOB: 03-Feb-1946 Height:   Weight: 70.5 kg  Insurance Information HMO:     PPO: yes     PCP:      IPA:      80/20:      OTHER:  PRIMARY: [employer]      Policy#: [id number]      Subscriber: pt CM Name: Cora Collum     Phone#: [phone]      Fax#: [phone] Pre-Cert#: [id number]    f/u with [name] in 7 days  Employer:  Benefits:  Phone #: online     Name:  Eff. Date: 11/01/19     Deduct: none      Out of Pocket Max: $3300      Life Max: none CIR: $125 co pay per day days 1 until 10      SNF: no copay days 1 until 20; $188 cop y per day days 21 until 41; no copay days 42 until 100 Outpatient: $20 per visit     Co-Pay: visits per medical neccesity Home Health: 100%      Co-Pay: visits per medical neccesity DME: 80%     Co-Pay: 20% Providers: in network  SECONDARY: none      Policy#:      Phone#:   Development worker, community:       Phone#:   The Engineer, petroleum" for patients in Inpatient Rehabilitation Facilities with attached "Privacy Act [place] Records" was provided and verbally reviewed with: Patient and Family  Emergency Contact Information Contact Information     Name Relation Home Work Mobile   Burgher,Alice Spouse [phone]         Current Medical History  Patient Admitting Diagnosis s/p resection of right basal ganglia metastasis with hyponatremia and hypokalemia  History of Present Illness:  Shannon Horton is a 75 year old right-handed male with history of glaucoma followed by Dr. 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   Independent with assistive device and reported multiple falls over the past 6 weeks.  Prior to 6 weeks ago patient independent and active.  Presented 03/25/2021 with persistent headaches balance deficits x6  weeks, left hemiparesis as well as shuffling gait.  MRI of the brain showed a 3.8 cm enhancing and hemorrhagic mass in the right basal ganglia/frontal lobe region with a 5 mm left midline shift.  CT of the chest and abdomen was notable for left upper extremity pulmonary nodule, left hilar adenopathy right upper lobe pulmonary nodule left lower quadrant mass arising from the right adrenal gland, hepatic lesions.  CT-guided biopsy of the adrenal mass 03/30/2021 positive for malignancy.  Patient underwent right craniotomy resection of tumor 03/31/2021 per Dr. Reatha Armour.  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  Brain surgical tissue pathology remain pending follow-up per oncology services Dr.Gudena as well as Dr. Alen Horton.  Decadron protocol as indicated.  Maintained on Keppra for seizure prophylaxis.  Patient was cleared to begin subcutaneous heparin for DVT prophylaxis 04/02/2021.  Tolerating a regular consistency diet.  Therapy evaluations completed due to patient decreased functional mobility left side weakness was admitted to inpatient rehab services 04/06/2021.  Patient with slow progressive gains while on rehab services.  He did receive followed by neuropsychology for hospital course depression and emotional support provided.  Wife and family did note some altered  mental status changes 04/17/2021 with urinalysis completed negative nitrite, CBC unremarkable and latest chemistry sodium of 130.  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  No definite acute ventriculomegaly or transependymal edema.  Contact made to neurosurgery due to these acute findings patient was discharged to acute care services for ongoing monitoring.  MRI of the brain completed 04/19/2021 showing acute left occipital horn intraventricular hemorrhage similar to recent CT of the head.  Mild increase in ventricular size  relative to prior MRI.  Evolving changes of right basal ganglia tumor resection with decreased surrounding edema and mass-effect.  Resolution of midline shift.  Patient with ongoing conservative care and he was maintained on low-dose Decadron therapy.  He was cleared to continue his subcutaneous heparin as directed.  Placed on sodium chloride tablets for hyponatremia with latest sodium level 132.  Mental status improved ammonia level 25.    Complete NIHSS TOTAL: 2  Patient's medical record from [facility]  has been reviewed by the rehabilitation admission coordinator and physician.  Past Medical History  Past Medical History:  Diagnosis Date   Allergic rhinitis    gets shots per Dr. Velora Heckler   Allergy    sees Dr. Harold Hedge    Asthma    Glaucoma    sees Dr. Satira Sark    Hypertension    Malignant melanoma [facility])    right lower leg, diagnosed on1/21/11    Family History   family history includes Cancer in his brother; Colon polyps in his mother; Diabetes in his father; Hypertension in his mother and another family member; Liver disease in his maternal uncle.  Prior Rehab/Hospitalizations Has the patient had prior rehab or hospitalizations prior to admission? Yes CIR 6/7 until 04/18/2021 Has the patient had major surgery during 100 days prior to admission? Yes   Current Medications  Current Facility-Administered Medications:    0.9 %  sodium chloride infusion, , Intravenous, Continuous, Meyran, Ocie Cornfield, NP, Last Rate: 50 mL/hr at 04/20/21 0401, New Bag at 04/20/21 0401   acetaminophen (TYLENOL) tablet 650 mg, 650 mg, Oral, Q4H PRN, Meyran, Ocie Cornfield, NP   albuterol (VENTOLIN HFA) 108 (90 Base) MCG/ACT inhaler 2 puff, 2 puff, Inhalation, Q4H PRN, Meyran, Ocie Cornfield, NP   amLODipine (NORVASC) tablet 5 mg, 5 mg, Oral, Daily, Meyran, Ocie Cornfield, NP, 5 mg at 04/20/21 0934   budesonide (PULMICORT) nebulizer solution 0.25 mg, 0.25 mg, Nebulization, BID, Meyran,  Ocie Cornfield, NP, 0.25 mg at 04/21/21 2122   Chlorhexidine Gluconate Cloth 2 % PADS 6 each, 6 each, Topical, Q0600, Meyran, Ocie Cornfield, NP, 6 each at 04/21/21 [phone]   dexamethasone (DECADRON) injection 2 mg, 2 mg, Intravenous, Q12H, Dawley, Troy C, DO   docusate sodium (COLACE) capsule 100 mg, 100 mg, Oral, BID PRN, Meyran, Ocie Cornfield, NP   heparin injection 5,000 Units, 5,000 Units, Subcutaneous, Q12H, Dawley, Troy C, DO, 5,000 Units at 04/20/21 2116   hydrALAZINE (APRESOLINE) injection 10 mg, 10 mg, Intravenous, Q4H PRN, Meyran, Ocie Cornfield, NP   irbesartan (AVAPRO) tablet 300 mg, 300 mg, Oral, Daily, 300 mg at 04/20/21 0934 **AND** hydrochlorothiazide (HYDRODIURIL) tablet 25 mg, 25 mg, Oral, Daily, Dawley, Troy C, DO, 25 mg at 04/20/21 0934   latanoprost (XALATAN) 0.005 % ophthalmic solution 1 drop, 1 drop, Both Eyes, QHS, Meyran, Ocie Cornfield, NP, 1 drop at 04/20/21 2113   levETIRAcetam (KEPPRA) tablet 250 mg, 250 mg,  Oral, BID, Dawley, Troy C, DO   methylphenidate (RITALIN) tablet 5 mg, 5 mg, Oral, BID WC, Dawley, Troy C, DO   ondansetron (ZOFRAN) injection 4 mg, 4 mg, Intravenous, Q6H PRN, Meyran, Ocie Cornfield, NP   pantoprazole (PROTONIX) EC tablet 40 mg, 40 mg, Oral, Daily, Henri Medal, RPH, 40 mg at 04/20/21 2119   polyethylene glycol (MIRALAX / GLYCOLAX) packet 17 g, 17 g, Oral, Daily PRN, Meyran, Ocie Cornfield, NP   sodium chloride 0.9 % bolus 1,000 mL, 1,000 mL, Intravenous, Once, Meyran, Ocie Cornfield, NP   sodium chloride tablet 2 g, 2 g, Oral, BID WC, Dawley, Troy C, DO, 2 g at 04/20/21 1755  Patients Current Diet:  Diet Order             Diet regular Room service appropriate? Yes; Fluid consistency: Thin  Diet effective now                   Precautions / Restrictions Precautions Precautions: Fall Precaution Comments: R crani Restrictions Weight Bearing Restrictions: No   Has the patient had 2 or more falls or a fall with injury in the  past year? Yes  Prior Activity Level Community (5-7x/wk): wife drives recently  Prior Functional Level Self Care: Did the patient need help bathing, dressing, using the toilet or eating? Recent decline in function needing assistance. Went from  using cane progressing to RW for ambulation intermittently PTA, recent history of multiple falls over the past 6 weeks. Prior to 6 weeks ago, pt biking 20+ miles on bike.    Indoor Mobility: Did the patient need assistance with walking from room to room (with or without device)? Independent  Stairs: Did the patient need assistance with internal or external stairs (with or without device)? Independent  Functional Cognition: Did the patient need help planning regular tasks such as shopping or remembering to take medications? [place] / Equipment Home Equipment: Walker - 2 wheels, [place] - single point, Hand held shower head  Prior Device Use: Indicate devices/aids used by the patient prior to current illness, exacerbation or injury? cane  Current Functional Level Cognition  Overall Cognitive Status: Impaired/Different from baseline Current Attention Level: Focused Orientation Level: Oriented to person, Disoriented to place, Disoriented to time, Disoriented to situation Following Commands: Follows one step commands inconsistently Safety/Judgement: Decreased awareness of safety, Decreased awareness of deficits General Comments: Pt oriented to self;  name and eventually stating date 04/20/2021 due to it being his 53rd wedding anniversary. Pt stating "June 1st, 2024 and June 01, 2023" Pt tolerating session well and following 1 step commands with multimodal cues. Pt naming household objects (utensils and hair brush with ability to use for intended purpose.)    Extremity Assessment (includes Sensation/Coordination)  Upper Extremity Assessment: Generalized weakness  Lower Extremity Assessment: Generalized weakness    ADLs   Overall ADL's : Needs assistance/impaired Eating/Feeding: Set up, Supervision/ safety, Sitting Grooming: Wash/dry hands, Standing Upper Body Bathing: Moderate assistance, Sitting Lower Body Bathing: Maximal assistance, Sitting/lateral leans, Sit to/from stand Upper Body Dressing : Moderate assistance, Sitting Lower Body Dressing: Maximal assistance, Sitting/lateral leans, Sit to/from stand Toilet Transfer: Maximal assistance, Cueing for safety Toilet Transfer Details (indicate cue type and reason): unable to test Toileting- Clothing Manipulation and Hygiene: Maximal assistance, Sit to/from stand Functional mobility during ADLs: Maximal assistance, +2 for physical assistance, +2 for safety/equipment (Pt requiring assist to scoot towards HOB in supine and to scoot pt closer to middle of bed.) General  ADL Comments: Pt having s/s of lethargy and confusion, but able to follow commands with mobility and ADL tasks. Pt set-upA for self feeding as lunch tray had just come. Pt was fatigued and wanting to eat in bed. +2 assist required for transfer.    Mobility  Overal bed mobility: Needs Assistance Bed Mobility: Supine to Sit, Sit to Supine, Rolling Rolling: Max assist Supine to sit: Mod assist Sit to supine: Max assist General bed mobility comments: Pt with difficulty sequencing through bed mobility to EOB with modA and maxA for returning to supine.    Transfers  Overall transfer level: Needs assistance Equipment used: Rolling walker (2 wheeled) Transfers: Sit to/from Stand Sit to Stand: Mod assist, From elevated surface General transfer comment: DNT as meal tray came in    Ambulation / Gait / Stairs / Wheelchair Mobility       Posture / Balance Dynamic Sitting Balance Sitting balance - Comments: Posterior and left lateral lean. Requiring modA to maxA for lateral lean Balance Overall balance assessment: Needs assistance, History of Falls Sitting-balance support: Feet supported Sitting  balance-Leahy Scale: Poor Sitting balance - Comments: Posterior and left lateral lean. Requiring modA to maxA for lateral lean    Special needs/care consideration Fall risk due to decreased safety awareness   Previous Home Environment  Living Arrangements: Spouse/significant other Available Help at Discharge: Family, Available 24 hours/day Type of Home: [place]: One level, Other (Comment) Home Access: Stairs to enter Entrance Stairs-Rails: None Entrance Stairs-Number of Steps: 1 Bathroom Shower/Tub: Walk-in shower, Tub/shower unit (walk in shower has door, tub has curtain) Armed forces training and education officer: Yes How Accessible: Accessible via walker [place]: No  Discharge Living Setting Plans for Discharge Living Setting: Patient's home Type of Home at Discharge: House Discharge Home Layout: One level, Other (Comment) Discharge Home Access: Stairs to enter Entrance Stairs-Rails: None Entrance Stairs-Number of Steps: 1 Discharge Bathroom Shower/Tub: Walk-in shower, Tub/shower unit Discharge Bathroom Toilet: Standard Discharge Bathroom Accessibility: Yes How Accessible: Accessible via walker Does the patient have any problems obtaining your medications?: No  Social/Family/Support Systems Patient Roles: Spouse, Parent Contact Information: wife, [name] Anticipated Caregiver: Wife Anticipated Caregiver's Contact Information: see above Ability/Limitations of Caregiver: Wife will provide 24/7 care. Sons will provide asssistance PRN. Caregiver Availability: 24/7 Discharge Plan Discussed with Primary Caregiver: Yes Is Caregiver In Agreement with Plan?: Yes Does Caregiver/Family have Issues with Lodging/Transportation while Pt is in Rehab?: No  Goals Supervision with PT , OT and SLP ELOS 2 to 3 weeks  Decrease burden of Care through IP rehab admission: n/a  Possible need for SNF placement upon discharge: not anticipated  Patient Condition: I  have reviewed medical records from [facility], spoken with CM, and spouse. I met with patient at the bedside for inpatient rehabilitation assessment.  Patient will benefit from ongoing PT, OT, and SLP, can actively participate in 3 hours of therapy a day 5 days of the week, and can make measurable gains during the admission.  Patient will also benefit from the coordinated team approach during an Inpatient Acute Rehabilitation admission.  The patient will receive intensive therapy as well as Rehabilitation physician, nursing, social worker, and care management interventions.  Due to bladder management, bowel management, safety, skin/wound care, disease management, medication administration, pain management, and patient education the patient requires 24 hour a day rehabilitation nursing.  The patient is currently mod to max assist overall with mobility and basic ADLs.  Discharge setting and therapy post  discharge at home with home health is anticipated.  Patient has agreed to participate in the Acute Inpatient Rehabilitation Program and will admit today.  Preadmission Screen Completed By:  Cleatrice Burke, 04/21/2021 11:27 AM ______________________________________________________________________   Discussed status with Dr. Ranell Patrick on  04/21/2021 at 1127 and received approval for admission today.  Admission Coordinator:  Cleatrice Burke, RN, time  [id number] Date  04/21/2021   Assessment/Plan: Diagnosis: IVH Does the need for close, 24 hr/day Medical supervision in concert with the patient's rehab needs make it unreasonable for this patient to be served in a less intensive setting? Yes Co-Morbidities requiring supervision/potential complications: melanoma of trunk and right leg, essential HTN, allergic rhinitis, asthma, malignant frontal lobe tumor Due to bladder management, bowel management, safety, skin/wound care, disease management, medication administration, pain management, and patient  education, does the patient require 24 hr/day rehab nursing? Yes Does the patient require coordinated care of a physician, rehab nurse, PT, OT, and SLP to address physical and functional deficits in the context of the above medical diagnosis(es)? Yes Addressing deficits in the following areas: balance, endurance, locomotion, strength, transferring, bowel/bladder control, bathing, dressing, feeding, grooming, toileting, cognition, speech, language, and psychosocial support Can the patient actively participate in an intensive therapy program of at least 3 hrs of therapy 5 days a week? Yes The potential for patient to make measurable gains while on inpatient rehab is excellent Anticipated functional outcomes upon discharge from inpatient rehab: supervision PT, supervision OT, supervision SLP Estimated rehab length of stay to reach the above functional goals is: 2-3 weeks Anticipated discharge destination: Home 10. Overall Rehab/Functional Prognosis: excellent   MD Signature: Leeroy Cha, MD

## 2021-04-20 NOTE — Progress Notes (Signed)
   Providing Compassionate, Quality Care - Together  NEUROSURGERY PROGRESS NOTE   S: No issues overnight. Stable exam  O: EXAM:  BP 126/69 (BP Location: Left Arm)   Pulse 60   Temp 97.8 F (36.6 C)   Resp 18   Wt 70.5 kg   SpO2 97%   BMI 23.63 kg/m   Awake, alert, oriented x2 PERRL Speech fluent, appropriate  CNs grossly intact  4+/5 BUE/BLE  Incision c/d/i  ASSESSMENT:  75 y.o. male with   Metastatic Melanoma S/p crani for resection of right frontal tumor  2. Hyponatremia, improving 3. Hypokalemia, improving  PLAN: - cont Na tabs - rehab planning - ucx neg - sqh - will update wife    Thank you for allowing me to participate in this patient's care.  Please do not hesitate to call with questions or concerns.   Elwin Sleight, [facility] Cell: [phone]

## 2021-04-20 NOTE — Evaluation (Signed)
Occupational Therapy Evaluation Patient Details Name: Shannon Horton. MRN: [id number] DOB: 03-26-1946 Today's Date: 04/20/2021    History of Present Illness 75 yo male presents to [facility] on 5/26 with headaches, balance issues with falls x4-6 weeks. MRI brain shows Large, 3.8 cm enhancing and hemorrhagic mass within the right basal ganglia/frontal lobe with 68mm L midline shift, concern for metastasis. CT chest/abdomen/pelvis shows L upper pulmonary nodul, L hilar adenopathy, RUL pulmonary nodule, upper quadrant mass arising from R adrenal gland, hepatic lesions. s/p CT guided biopsy R adrenal mass on 5/31 with pathology + for malignant neoplasm, oncology following. s/p R frontal craniotomy 6/1. Discharged to CIR and then readmitted to acute after increased confusion and lethargy. CT brain revealed stable postsurgical resection cavity, small L IVH. PMH includes glaucoma, HTN, malignant melanoma 2011.   Clinical Impression   Pt PTA: prior to CIR, pt was independent and very active. Pt currently, having s/s of lethargy and confusion, but able to follow commands with mobility and ADL tasks. Pt set-upA for self feeding as lunch tray had just come. Pt was fatigued and wanting to eat in bed. Pt +2 assist required for transfer so unable to assist at this time. Pt briefly sitting EOB with modA to maxA for stability. Pt maxA+2 for scooting towards HOB and modA for rolling. Pt with bouts of confusion and finally able to say correct date with increased time and after corrections. Pt appears to be attentive to both side using BUEs for self feeding and looking to L when talking to OTR. Pt would greatly benefit from continued OT skilled services in CIR setting. OT following acutely.    Follow Up Recommendations  CIR    Equipment Recommendations  3 in 1 bedside commode    Recommendations for Other Services Rehab consult     Precautions / Restrictions Precautions Precautions: Fall Precaution Comments: R  crani Restrictions Weight Bearing Restrictions: No      Mobility Bed Mobility Overal bed mobility: Needs Assistance Bed Mobility: Supine to Sit;Sit to Supine;Rolling Rolling: Max assist   Supine to sit: Mod assist Sit to supine: Max assist   General bed mobility comments: Pt with difficulty sequencing through bed mobility to EOB with modA and maxA for returning to supine.    Transfers Overall transfer level: Needs assistance Equipment used: Rolling walker (2 wheeled) Transfers: Sit to/from Stand Sit to Stand: Mod assist;From elevated surface         General transfer comment: DNT as meal tray came in    Balance Overall balance assessment: Needs assistance;History of Falls Sitting-balance support: Feet supported Sitting balance-Leahy Scale: Poor Sitting balance - Comments: Posterior and left lateral lean. Requiring modA to maxA for lateral lean                                   ADL either performed or assessed with clinical judgement   ADL Overall ADL's : Needs assistance/impaired Eating/Feeding: Set up;Supervision/ safety;Sitting   Grooming: Wash/dry hands;Standing   Upper Body Bathing: Moderate assistance;Sitting   Lower Body Bathing: Maximal assistance;Sitting/lateral leans;Sit to/from stand   Upper Body Dressing : Moderate assistance;Sitting   Lower Body Dressing: Maximal assistance;Sitting/lateral leans;Sit to/from stand   Toilet Transfer: Maximal assistance;Cueing for safety Toilet Transfer Details (indicate cue type and reason): unable to test Toileting- Clothing Manipulation and Hygiene: Maximal assistance;Sit to/from stand       Functional mobility during ADLs: Maximal assistance;+2 for  physical assistance;+2 for safety/equipment (Pt requiring assist to scoot towards [place] in supine and to scoot pt closer to middle of bed.) General ADL Comments: Pt having s/s of lethargy and confusion, but able to follow commands with mobility and ADL  tasks. Pt set-upA for self feeding as lunch tray had just come. Pt was fatigued and wanting to eat in bed. +2 assist required for transfer.     Vision Baseline Vision/History: Wears glasses Wears Glasses: At all times Patient Visual Report: No change from baseline Vision Assessment?: Vision impaired- to be further tested in functional context;Yes Eye Alignment: Within Functional Limits Ocular Range of Motion: Within Functional Limits Additional Comments: Pt appears to be scanning well.     Perception Perception Perception Tested?: Yes Comments: Pt attending to both sides of body today   Praxis      Pertinent Vitals/Pain Pain Assessment: Faces Faces Pain Scale: No hurt Pain Intervention(s): Monitored during session     Hand Dominance Right   Extremity/Trunk Assessment Upper Extremity Assessment Upper Extremity Assessment: Generalized weakness   Lower Extremity Assessment Lower Extremity Assessment: Generalized weakness   Cervical / Trunk Assessment Cervical / Trunk Assessment: Kyphotic   Communication Communication Communication: No difficulties   Cognition Arousal/Alertness: Awake/alert Behavior During Therapy: Restless Overall Cognitive Status: Impaired/Different from baseline Area of Impairment: Attention;Safety/judgement;Problem solving;Orientation;Following commands;Awareness                 Orientation Level: Disoriented to;Place;Time;Situation Current Attention Level: Focused Memory: Decreased short-term memory Following Commands: Follows one step commands inconsistently Safety/Judgement: Decreased awareness of safety;Decreased awareness of deficits Awareness: Intellectual Problem Solving: Decreased initiation;Difficulty sequencing;Requires verbal cues General Comments: Pt oriented to self;  name and eventually stating date 04/20/2021 due to it being his 53rd wedding anniversary. Pt stating "June 1st, 2024 and June 01, 2023" Pt tolerating session well  and following 1 step commands with multimodal cues. Pt naming household objects (utensils and hair brush with ability to use for intended purpose.)   General Comments  VSS on RA    Exercises     Shoulder Instructions      Home Living Family/patient expects to be discharged to:: Private residence Living Arrangements: Spouse/significant other Available Help at Discharge: Family;Available 24 hours/day Type of Home: House Home Access: Stairs to enter [employer] of Steps: 1 Entrance Stairs-Rails: None Home Layout: One level;Other (Comment)     Bathroom Shower/Tub: Walk-in shower;Tub/shower unit (walk in shower has door, tub has curtain)   Bathroom Toilet: Standard Bathroom Accessibility: Yes   Home Equipment: Walker - 2 wheels;Cane - single point;Hand held shower head          Prior Functioning/Environment Level of Independence: Independent with assistive device(s)        Comments: Pt was working with CIR prior to this pt reports using cane progressing to RW for ambulation intermittently PTA, recent history of multiple falls over the past 6 weeks. Prior to 6 weeks ago, pt biking 20+ miles on bike.        OT Problem List: Decreased strength;Decreased activity tolerance;Impaired balance (sitting and/or standing);Impaired vision/perception;Decreased coordination;Decreased cognition;Decreased safety awareness;Decreased knowledge of use of DME or AE;Impaired UE functional use;Pain      OT Treatment/Interventions: Self-care/ADL training;Therapeutic exercise;Neuromuscular education;DME and/or AE instruction;Therapeutic activities;Cognitive remediation/compensation;Visual/perceptual remediation/compensation;Patient/family education;Balance training    OT Goals(Current goals can be found in the care plan section) Acute Rehab OT Goals Patient Stated Goal: to eat OT Goal Formulation: With patient/family Time For Goal Achievement: 05/04/21 Potential to Achieve Goals:  Good ADL Goals Pt Will Perform Grooming: with min guard assist;standing Pt Will Transfer to Toilet: stand pivot transfer;bedside commode;with mod assist Pt Will Perform Toileting - Clothing Manipulation and hygiene: with mod assist;sit to/from stand Additional ADL Goal #1: Pt will follow 1-2 step commands with minimal multimodal cues in a minimally distracting environment with 100% accuracy in 4/5 trials.  OT Frequency: Min 2X/week   Barriers to D/C:            Co-evaluation              AM-PAC OT "6 Clicks" Daily Activity     Outcome Measure Help from another person eating meals?: A Little Help from another person taking care of personal grooming?: A Little Help from another person toileting, which includes using toliet, bedpan, or urinal?: A Little Help from another person bathing (including washing, rinsing, drying)?: A Little Help from another person to put on and taking off regular upper body clothing?: None Help from another person to put on and taking off regular lower body clothing?: A Little 6 Click Score: 19   End of Session Nurse Communication: Mobility status  Activity Tolerance: Patient tolerated treatment well Patient left: in bed;with call bell/phone within reach;with bed alarm set;with family/visitor present  OT Visit Diagnosis: Unsteadiness on feet (R26.81);Muscle weakness (generalized) (M62.81);Repeated falls (R29.6);Other symptoms and signs involving cognitive function                Time: 1220-1250 OT Time Calculation (min): 30 min Charges:  OT General Charges $OT Visit: 1 Visit OT Evaluation $OT Eval Moderate Complexity: 1 Mod OT Treatments $Self Care/Home Management : 8-22 mins  Jefferey Pica, OTR/L Acute Rehabilitation Services Pager: [phone] Office: [phone]   Jefferey Pica 04/20/2021, 1:16 PM

## 2021-04-20 NOTE — Progress Notes (Signed)
Inpatient Rehabilitation Admissions Coordinator   Patient readmitted to acute from CIR. CIR 6/7 until 6/19. I will follow up after therapy evals and with CIR team and pt/family to assist with dispo.  Danne Baxter, RN, MSN Rehab Admissions Coordinator [phone] 04/20/2021 8:41 AM

## 2021-04-20 NOTE — Progress Notes (Addendum)
Inpatient Rehabilitation Admissions Coordinator   I met at bedside with patient and his wife. I discussed options of CIR readmit vs SNF depending on her preference, insurance approvals and patient's medical readiness to pursue rehab venues. She would like to discuss with Dr. Reatha Armour before proceeding further. I will notify him of her wishes.  Danne Baxter, RN, MSN Rehab Admissions Coordinator [phone] 04/20/2021 11:32 AM  I received call from pt's wife and she would like me to pursue CIR approval with his [facility] [employer]. I await OT eval and then will open case. She still would like to discuss with Dr. Reatha Armour his recommendations.  Danne Baxter, RN, MSN Rehab Admissions Coordinator [phone] 04/20/2021 11:58 AM

## 2021-04-20 NOTE — Evaluation (Signed)
Physical Therapy Evaluation Patient Details Name: Shannon Horton. MRN: [id number] DOB: 05-14-1946 Today's Date: 04/20/2021   History of Present Illness  75 yo male presents to [facility] on 5/26 with headaches, balance issues with falls x4-6 weeks. MRI brain shows Large, 3.8 cm enhancing and hemorrhagic mass within the right basal ganglia/frontal lobe with 27mm L midline shift, concern for metastasis. CT chest/abdomen/pelvis shows L upper pulmonary nodul, L hilar adenopathy, RUL pulmonary nodule, upper quadrant mass arising from R adrenal gland, hepatic lesions. s/p CT guided biopsy R adrenal mass on 5/31 with pathology + for malignant neoplasm, oncology following. s/p R frontal craniotomy 6/1. Discharged to CIR and then readmitted to acute after increased confusion and lethargy. CT brain revealed stable postsurgical resection cavity, small L IVH. PMH includes glaucoma, HTN, malignant melanoma 2011.  Clinical Impression  Pt re-admitted to acute from CIR. Pt with regression towards his physical therapy goals. Oriented to self only and follows one step commands inconsistently; requires multimodal cueing and repetition for completing motor tasks. Pt requiring mod-max assist for functional mobility. Demonstrates posterior and left lateral lean with sitting on side of bed. Able to stand, but unable to weight shift in order to take steps or pivot with one person assist safely. Pt presents with decreased cognition, poor balance, weakness, muscular joint tightness, and postural abnormalities. Recommend CIR to address deficits and maximize functional mobility.     Follow Up Recommendations CIR    Equipment Recommendations  3in1 (PT);Wheelchair (measurements PT);Wheelchair cushion (measurements PT)    Recommendations for Other Services       Precautions / Restrictions Precautions Precautions: Fall Precaution Comments: R crani Restrictions Weight Bearing Restrictions: No      Mobility  Bed  Mobility Overal bed mobility: Needs Assistance Bed Mobility: Supine to Sit;Sit to Supine     Supine to sit: Mod assist Sit to supine: Max assist   General bed mobility comments: Pt initiating sitting up on edge of bed with max multimodal cues and repetition. Able to bring BLE's off edge of bed, ultimately requiring use of bed to scoot hips forward and modA at trunk to boost upright. Pt requiring maxA to assist back into supine with trunk and BLE negotiation.    Transfers Overall transfer level: Needs assistance Equipment used: Rolling walker (2 wheeled) Transfers: Sit to/from Stand Sit to Stand: Mod assist;From elevated surface         General transfer comment: Heavy modA to rise from edge of bed with posterior lean and bilateral knee flexion. Pt unable to weight shift forward  Ambulation/Gait                Stairs            Wheelchair Mobility    Modified Rankin (Stroke Patients Only)       Balance Overall balance assessment: Needs assistance;History of Falls Sitting-balance support: Feet supported Sitting balance-Leahy Scale: Poor Sitting balance - Comments: Posterior and left lateral lean. Requiring min-maxA                                     Pertinent Vitals/Pain Pain Assessment: Faces Faces Pain Scale: No hurt    Home Living Family/patient expects to be discharged to:: Private residence Living Arrangements: Spouse/significant other Available Help at Discharge: Family;Available 24 hours/day Type of Home: House Home Access: Stairs to enter Entrance Stairs-Rails: None Entrance Stairs-Number of Steps: 1 Home Layout: One  level;Other (Comment) Home Equipment: Walker - 2 wheels;Cane - single point;Hand held shower head      Prior Function Level of Independence: Independent with assistive device(s)         Comments: pt reports using cane progressing to RW for ambulation intermittently PTA, recent history of multiple falls over  the past 6 weeks. Prior to 6 weeks ago, pt biking 20+ miles on bike.     Hand Dominance   Dominant Hand: Right    Extremity/Trunk Assessment   Upper Extremity Assessment Upper Extremity Assessment: Defer to OT evaluation    Lower Extremity Assessment Lower Extremity Assessment: Generalized weakness    Cervical / Trunk Assessment Cervical / Trunk Assessment: Kyphotic  Communication   Communication: No difficulties  Cognition Arousal/Alertness: Awake/alert Behavior During Therapy: Restless Overall Cognitive Status: Impaired/Different from baseline Area of Impairment: Attention;Safety/judgement;Problem solving;Orientation;Following commands;Awareness                 Orientation Level: Disoriented to;Place;Time;Situation Current Attention Level: Focused Memory: Decreased short-term memory Following Commands: Follows one step commands inconsistently Safety/Judgement: Decreased awareness of safety;Decreased awareness of deficits Awareness: Intellectual Problem Solving: Decreased initiation;Difficulty sequencing;Requires verbal cues General Comments: Pt oriented to self only. Able to correctly state the month is "june," when given options. Stating year was 2024. When asked when his birthday was, pt began talking about riding bicycles. Very poor awareness of safety/deficits, often falling back in the bed without righting reactions.      General Comments      Exercises     Assessment/Plan    PT Assessment Patient needs continued PT services  PT Problem List Decreased strength;Decreased mobility;Decreased safety awareness;Decreased activity tolerance;Decreased balance;Decreased knowledge of use of DME;Decreased cognition;Pain;Cardiopulmonary status limiting activity;Decreased knowledge of precautions       PT Treatment Interventions DME instruction;Therapeutic activities;Gait training;Therapeutic exercise;Patient/family education;Balance training;Stair  training;Functional mobility training;Neuromuscular re-education    PT Goals (Current goals can be found in the Care Plan section)  Acute Rehab PT Goals Patient Stated Goal: "to see his wife." PT Goal Formulation: With patient Time For Goal Achievement: 05/04/21 Potential to Achieve Goals: Fair    Frequency Min 4X/week   Barriers to discharge        Co-evaluation               AM-PAC PT "6 Clicks" Mobility  Outcome Measure Help needed turning from your back to your side while in a flat bed without using bedrails?: A Lot Help needed moving from lying on your back to sitting on the side of a flat bed without using bedrails?: A Lot Help needed moving to and from a bed to a chair (including a wheelchair)?: A Lot Help needed standing up from a chair using your arms (e.g., wheelchair or bedside chair)?: A Lot Help needed to walk in hospital room?: Total Help needed climbing 3-5 steps with a railing? : Total 6 Click Score: 10    End of Session Equipment Utilized During Treatment: Gait belt Activity Tolerance: Patient tolerated treatment well Patient left: in bed;with call bell/phone within reach;with bed alarm set Nurse Communication: Mobility status PT Visit Diagnosis: Other abnormalities of gait and mobility (R26.89);Difficulty in walking, not elsewhere classified (R26.2)    Time: 4098-1191 PT Time Calculation (min) (ACUTE ONLY): 29 min   Charges:   PT Evaluation $PT Eval Moderate Complexity: 1 Mod PT Treatments $Therapeutic Activity: 8-22 mins        Wyona Almas, PT, DPT Acute Rehabilitation Services Pager [phone] Office [phone]  Deno Etienne 04/20/2021, 10:17 AM

## 2021-04-21 ENCOUNTER — Inpatient Hospital Stay (HOSPITAL_COMMUNITY)
Admission: RE | Admit: 2021-04-21 | Discharge: 2021-05-01 | DRG: 057 | Disposition: A | Discharge status: Other Acute Inpatient Hospital | Payer: Medicare PPO | Source: Intra-hospital | Attending: Physical Medicine & Rehabilitation | Admitting: Physical Medicine & Rehabilitation

## 2021-04-21 ENCOUNTER — Encounter (HOSPITAL_COMMUNITY): Payer: Self-pay | Admitting: Physical Medicine & Rehabilitation

## 2021-04-21 ENCOUNTER — Other Ambulatory Visit: Payer: Self-pay

## 2021-04-21 DIAGNOSIS — R001 Bradycardia, unspecified: Secondary | ICD-10-CM | POA: Diagnosis not present

## 2021-04-21 DIAGNOSIS — Z7189 Other specified counseling: Secondary | ICD-10-CM | POA: Diagnosis not present

## 2021-04-21 DIAGNOSIS — Z515 Encounter for palliative care: Secondary | ICD-10-CM | POA: Diagnosis not present

## 2021-04-21 DIAGNOSIS — Z809 Family history of malignant neoplasm, unspecified: Secondary | ICD-10-CM | POA: Diagnosis not present

## 2021-04-21 DIAGNOSIS — Z7952 Long term (current) use of systemic steroids: Secondary | ICD-10-CM | POA: Diagnosis not present

## 2021-04-21 DIAGNOSIS — E440 Moderate protein-calorie malnutrition: Secondary | ICD-10-CM | POA: Insufficient documentation

## 2021-04-21 DIAGNOSIS — Z8249 Family history of ischemic heart disease and other diseases of the circulatory system: Secondary | ICD-10-CM | POA: Diagnosis not present

## 2021-04-21 DIAGNOSIS — D496 Neoplasm of unspecified behavior of brain: Secondary | ICD-10-CM | POA: Diagnosis not present

## 2021-04-21 DIAGNOSIS — C7931 Secondary malignant neoplasm of brain: Secondary | ICD-10-CM | POA: Diagnosis present

## 2021-04-21 DIAGNOSIS — Z79899 Other long term (current) drug therapy: Secondary | ICD-10-CM

## 2021-04-21 DIAGNOSIS — E871 Hypo-osmolality and hyponatremia: Secondary | ICD-10-CM | POA: Diagnosis present

## 2021-04-21 DIAGNOSIS — Z833 Family history of diabetes mellitus: Secondary | ICD-10-CM

## 2021-04-21 DIAGNOSIS — Z87891 Personal history of nicotine dependence: Secondary | ICD-10-CM | POA: Diagnosis not present

## 2021-04-21 DIAGNOSIS — J45909 Unspecified asthma, uncomplicated: Secondary | ICD-10-CM | POA: Diagnosis present

## 2021-04-21 DIAGNOSIS — I69222 Dysarthria following other nontraumatic intracranial hemorrhage: Principal | ICD-10-CM

## 2021-04-21 DIAGNOSIS — Z8371 Family history of colonic polyps: Secondary | ICD-10-CM | POA: Diagnosis not present

## 2021-04-21 DIAGNOSIS — R5383 Other fatigue: Secondary | ICD-10-CM | POA: Diagnosis not present

## 2021-04-21 DIAGNOSIS — E876 Hypokalemia: Secondary | ICD-10-CM | POA: Diagnosis not present

## 2021-04-21 DIAGNOSIS — Z91018 Allergy to other foods: Secondary | ICD-10-CM | POA: Diagnosis not present

## 2021-04-21 DIAGNOSIS — I69254 Hemiplegia and hemiparesis following other nontraumatic intracranial hemorrhage affecting left non-dominant side: Secondary | ICD-10-CM

## 2021-04-21 DIAGNOSIS — Z66 Do not resuscitate: Secondary | ICD-10-CM | POA: Diagnosis not present

## 2021-04-21 DIAGNOSIS — H409 Unspecified glaucoma: Secondary | ICD-10-CM | POA: Diagnosis present

## 2021-04-21 DIAGNOSIS — C439 Malignant melanoma of skin, unspecified: Secondary | ICD-10-CM | POA: Diagnosis present

## 2021-04-21 DIAGNOSIS — I615 Nontraumatic intracerebral hemorrhage, intraventricular: Secondary | ICD-10-CM

## 2021-04-21 DIAGNOSIS — Z7951 Long term (current) use of inhaled steroids: Secondary | ICD-10-CM | POA: Diagnosis not present

## 2021-04-21 DIAGNOSIS — G9340 Encephalopathy, unspecified: Secondary | ICD-10-CM | POA: Diagnosis not present

## 2021-04-21 DIAGNOSIS — I69219 Unspecified symptoms and signs involving cognitive functions following other nontraumatic intracranial hemorrhage: Secondary | ICD-10-CM

## 2021-04-21 DIAGNOSIS — I10 Essential (primary) hypertension: Secondary | ICD-10-CM | POA: Diagnosis not present

## 2021-04-21 DIAGNOSIS — C711 Malignant neoplasm of frontal lobe: Secondary | ICD-10-CM | POA: Diagnosis present

## 2021-04-21 DIAGNOSIS — R4182 Altered mental status, unspecified: Secondary | ICD-10-CM | POA: Diagnosis not present

## 2021-04-21 DIAGNOSIS — R404 Transient alteration of awareness: Secondary | ICD-10-CM | POA: Diagnosis not present

## 2021-04-21 LAB — BASIC METABOLIC PANEL
Anion gap: 11 (ref 5–15)
BUN: 8 mg/dL (ref 8–23)
CO2: 27 mmol/L (ref 22–32)
Calcium: 9.1 mg/dL (ref 8.9–10.3)
Chloride: 94 mmol/L — ABNORMAL LOW (ref 98–111)
Creatinine, Ser: 0.5 mg/dL — ABNORMAL LOW (ref 0.61–1.24)
GFR, Estimated: >60 mL/min (ref >60)
Glucose, Bld: 134 mg/dL — ABNORMAL HIGH (ref 70–99)
Potassium: 3.4 mmol/L — ABNORMAL LOW (ref 3.5–5.1)
Sodium: 132 mmol/L — ABNORMAL LOW (ref 135–145)

## 2021-04-21 LAB — CBC
HCT: 40.7 % (ref 39.0–52.0)
Hemoglobin: 14 g/dL (ref 13.0–17.0)
MCH: 31.4 pg (ref 26.0–34.0)
MCHC: 34.4 g/dL (ref 30.0–36.0)
MCV: 91.3 fL (ref 80.0–100.0)
Platelets: 221 10*3/uL (ref 150–400)
RBC: 4.46 MIL/uL (ref 4.22–5.81)
RDW: 12.6 % (ref 11.5–15.5)
WBC: 5.5 10*3/uL (ref 4.0–10.5)
nRBC: 0 % (ref 0.0–0.2)

## 2021-04-21 MED ORDER — SODIUM CHLORIDE 1 G PO TABS
2.0000 g | ORAL_TABLET | Freq: Two times a day (BID) | ORAL | Status: DC
  Administered 2021-04-21 – 2021-05-01 (×20): 2 g via ORAL
  Filled 2021-04-21 (×22): qty 2

## 2021-04-21 MED ORDER — METHYLPHENIDATE HCL 5 MG PO TABS
5.0000 mg | ORAL_TABLET | Freq: Two times a day (BID) | ORAL | Status: DC
  Administered 2021-04-22 – 2021-04-26 (×9): 5 mg via ORAL
  Filled 2021-04-21 (×9): qty 1

## 2021-04-21 MED ORDER — SODIUM CHLORIDE 1 G PO TABS: 2.0000 g | ORAL_TABLET | Freq: Two times a day (BID) | ORAL | 2 refills | Status: DC

## 2021-04-21 MED ORDER — LEVETIRACETAM 250 MG PO TABS
250.0000 mg | ORAL_TABLET | Freq: Two times a day (BID) | ORAL | Status: DC
  Administered 2021-04-21: 250 mg via ORAL
  Filled 2021-04-21 (×2): qty 1

## 2021-04-21 MED ORDER — DEXAMETHASONE 4 MG PO TABS
2.0000 mg | ORAL_TABLET | Freq: Two times a day (BID) | ORAL | Status: DC
  Administered 2021-04-21 – 2021-04-27 (×13): 2 mg via ORAL
  Filled 2021-04-21 (×13): qty 1

## 2021-04-21 MED ORDER — METHYLPHENIDATE HCL 5 MG PO TABS: 5.0000 mg | ORAL_TABLET | Freq: Two times a day (BID) | ORAL | 0 refills | Status: DC

## 2021-04-21 MED ORDER — HEPARIN SODIUM (PORCINE) 5000 UNIT/ML IJ SOLN
5000.0000 [IU] | Freq: Two times a day (BID) | INTRAMUSCULAR | Status: DC
  Administered 2021-04-21 – 2021-05-01 (×20): 5000 [IU] via SUBCUTANEOUS
  Filled 2021-04-21 (×20): qty 1

## 2021-04-21 MED ORDER — HYDROCHLOROTHIAZIDE 25 MG PO TABS
25.0000 mg | ORAL_TABLET | Freq: Every day | ORAL | Status: DC
  Administered 2021-04-22 – 2021-04-27 (×6): 25 mg via ORAL
  Filled 2021-04-21 (×6): qty 1

## 2021-04-21 MED ORDER — METHYLPHENIDATE HCL 5 MG PO TABS
5.0000 mg | ORAL_TABLET | Freq: Two times a day (BID) | ORAL | Status: DC
  Administered 2021-04-21: 5 mg via ORAL
  Filled 2021-04-21: qty 1

## 2021-04-21 MED ORDER — ACETAMINOPHEN 325 MG PO TABS
650.0000 mg | ORAL_TABLET | ORAL | Status: DC | PRN
  Administered 2021-04-26: 650 mg via ORAL
  Filled 2021-04-21 (×2): qty 2

## 2021-04-21 MED ORDER — LEVETIRACETAM 250 MG PO TABS: 250.0000 mg | ORAL_TABLET | Freq: Two times a day (BID) | ORAL | 2 refills | Status: DC

## 2021-04-21 MED ORDER — POLYETHYLENE GLYCOL 3350 17 G PO PACK
17.0000 g | PACK | Freq: Every day | ORAL | Status: DC | PRN
  Administered 2021-04-27: 17 g via ORAL
  Filled 2021-04-21: qty 1

## 2021-04-21 MED ORDER — DOCUSATE SODIUM 100 MG PO CAPS
100.0000 mg | ORAL_CAPSULE | Freq: Two times a day (BID) | ORAL | Status: DC | PRN
  Administered 2021-04-27: 100 mg via ORAL
  Filled 2021-04-21: qty 1

## 2021-04-21 MED ORDER — BRINZOLAMIDE 1 % OP SUSP
1.0000 [drp] | Freq: Two times a day (BID) | OPHTHALMIC | Status: DC
  Administered 2021-04-21 – 2021-05-01 (×20): 1 [drp] via OPHTHALMIC
  Filled 2021-04-21: qty 10

## 2021-04-21 MED ORDER — HEPARIN SODIUM (PORCINE) 5000 UNIT/ML IJ SOLN: 5000.0000 [IU] | Freq: Two times a day (BID) | INTRAMUSCULAR | Status: DC

## 2021-04-21 MED ORDER — LATANOPROST 0.005 % OP SOLN
1.0000 [drp] | Freq: Every day | OPHTHALMIC | Status: DC
  Administered 2021-04-21 – 2021-04-30 (×10): 1 [drp] via OPHTHALMIC
  Filled 2021-04-21: qty 2.5

## 2021-04-21 MED ORDER — PANTOPRAZOLE SODIUM 40 MG PO TBEC
40.0000 mg | DELAYED_RELEASE_TABLET | Freq: Every day | ORAL | Status: DC
  Administered 2021-04-21 – 2021-04-30 (×10): 40 mg via ORAL
  Filled 2021-04-21 (×10): qty 1

## 2021-04-21 MED ORDER — BUDESONIDE 0.25 MG/2ML IN SUSP
0.2500 mg | Freq: Two times a day (BID) | RESPIRATORY_TRACT | Status: DC
  Administered 2021-04-23 – 2021-05-01 (×14): 0.25 mg via RESPIRATORY_TRACT
  Filled 2021-04-21 (×22): qty 2

## 2021-04-21 MED ORDER — ENSURE ENLIVE PO LIQD
237.0000 mL | Freq: Two times a day (BID) | ORAL | Status: DC
  Administered 2021-04-22 (×2): 237 mL via ORAL

## 2021-04-21 MED ORDER — DEXAMETHASONE SODIUM PHOSPHATE 4 MG/ML IJ SOLN
2.0000 mg | Freq: Two times a day (BID) | INTRAMUSCULAR | Status: DC
  Administered 2021-04-21: 2 mg via INTRAVENOUS
  Filled 2021-04-21: qty 1

## 2021-04-21 MED ORDER — AMLODIPINE BESYLATE 5 MG PO TABS
5.0000 mg | ORAL_TABLET | Freq: Every day | ORAL | Status: DC
  Administered 2021-04-22 – 2021-05-01 (×10): 5 mg via ORAL
  Filled 2021-04-21 (×10): qty 1

## 2021-04-21 MED ORDER — IRBESARTAN 300 MG PO TABS
300.0000 mg | ORAL_TABLET | Freq: Every day | ORAL | Status: DC
  Administered 2021-04-22 – 2021-05-01 (×10): 300 mg via ORAL
  Filled 2021-04-21 (×10): qty 1

## 2021-04-21 MED ORDER — LEVETIRACETAM 250 MG PO TABS
250.0000 mg | ORAL_TABLET | Freq: Two times a day (BID) | ORAL | Status: AC
  Administered 2021-04-21 – 2021-04-26 (×11): 250 mg via ORAL
  Filled 2021-04-21 (×11): qty 1

## 2021-04-21 MED ORDER — ALBUTEROL SULFATE (2.5 MG/3ML) 0.083% IN NEBU: 2.5000 mg | INHALATION_SOLUTION | RESPIRATORY_TRACT | Status: DC | PRN

## 2021-04-21 MED ORDER — ALBUTEROL SULFATE HFA 108 (90 BASE) MCG/ACT IN AERS: 2.0000 | INHALATION_SPRAY | RESPIRATORY_TRACT | Status: DC | PRN

## 2021-04-21 MED ORDER — DEXAMETHASONE 1 MG PO TABS: 2.0000 mg | ORAL_TABLET | Freq: Two times a day (BID) | ORAL | 0 refills | Status: DC

## 2021-04-21 NOTE — Progress Notes (Signed)
Inpatient Rehabilitation Medication Review by a Pharmacist  A complete drug regimen review was completed for this patient to identify any potential clinically significant medication issues.  Clinically significant medication issues were identified:  No  Check AMION for pharmacist assigned to patient if future medication questions/issues arise during this admission.  Pharmacist comments: Restarted is prior to admission azopt eye drops  Time spent performing this drug regimen review (minutes):  10 minutes   Tad Moore 04/21/2021 3:36 PM

## 2021-04-21 NOTE — Progress Notes (Signed)
Izora Ribas, MD   Physician  Physical Medicine and Rehabilitation  PMR Pre-admission     Signed  Date of Service:  04/20/2021  2:45 PM       Related encounter: Admission (Current) from 04/18/2021 in [place]          Show:Clear all _0 Written_1 Templated_2 Copied  Added by: _3 Cristina Gong, RN_4 Ranell Patrick Clide Deutscher, MD   _5 [place] for details                                                                                                                                                                                                                                                                                                                                                                                                                                         PMR Admission Coordinator Pre-Admission Assessment   Patient: Shannon Horton. is an 75 y.o., male MRN: [id number] DOB: 04/26/1946 Height:   Weight: 70.5 kg   Insurance Information HMO:     PPO: yes     PCP:      IPA:      80/20:      OTHER: PRIMARY: [employer]      Policy#: [id number]      Subscriber: pt CM Name: Shannon Horton     Phone#: [phone]      Fax#: [phone]  Pre-Cert#: [id number]    f/u with [name] in 7 days  Employer: Benefits:  Phone #: online     Name: Eff. Date: 11/01/19     Deduct: none      Out of Pocket Max: $3300      Life Max: none CIR: $125 co pay per day days 1 until 10      SNF: no copay days 1 until 20; $188 cop y per day days 21 until 41; no copay days 42 until 100 Outpatient: $20 per visit     Co-Pay: visits per medical neccesity Home Health: 100%      Co-Pay: visits per medical neccesity DME: 80%     Co-Pay: 20% Providers: in network  SECONDARY: none      Policy#:      Phone#:   Publishing copy:       Phone#:   [employer]" for patients in Inpatient Rehabilitation Facilities with attached "Privacy Act [place] Records" was provided and verbally reviewed with: Patient and Family   Emergency Contact Information Contact Information       Name Relation Home Work Mobile    Scerbo,Shannon Horton [phone]               Current Medical History  Patient Admitting Diagnosis s/p resection of right basal ganglia metastasis with hyponatremia and hypokalemia   History of Present Illness:  Shannon Horton is a 75 year old right-handed male with history of glaucoma followed by Dr. 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   Independent with assistive device and reported multiple falls over the past 6 weeks.  Prior to 6 weeks ago patient independent and active.  Presented 03/25/2021 with persistent headaches balance deficits x6 weeks, left hemiparesis as well as shuffling gait.  MRI of the brain showed a 3.8 cm enhancing and hemorrhagic mass in the right basal ganglia/frontal lobe region with a 5 mm left midline shift.  CT of the chest and abdomen was notable for left upper extremity pulmonary nodule, left hilar adenopathy right upper lobe pulmonary nodule left lower quadrant mass arising from the right adrenal gland, hepatic lesions.  CT-guided biopsy of the adrenal mass 03/30/2021 positive for malignancy.  Patient underwent right craniotomy resection of tumor 03/31/2021 per Dr. Reatha Armour.  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  Brain surgical tissue pathology remain pending follow-up per oncology services Dr.Gudena as well as Dr. Alen Blew.  Decadron protocol as indicated.  Maintained on Keppra for seizure prophylaxis.  Patient was cleared to begin subcutaneous heparin for DVT prophylaxis 04/02/2021.  Tolerating a regular consistency diet.   Therapy evaluations completed due to patient decreased functional mobility left side weakness was admitted to inpatient rehab services 04/06/2021.  Patient with slow progressive gains while on rehab services.  He did receive followed by neuropsychology for hospital course depression and emotional support provided.  Wife and family did note some altered mental status changes 04/17/2021 with urinalysis completed negative nitrite, CBC unremarkable and latest chemistry sodium of 130.  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  No definite acute ventriculomegaly or transependymal edema.  Contact made to neurosurgery due to these acute findings patient was discharged to acute care services for ongoing monitoring.  MRI of the brain completed 04/19/2021 showing acute left occipital horn intraventricular  hemorrhage similar to recent CT of the head.  Mild increase in ventricular size relative to prior MRI.  Evolving changes of right basal ganglia tumor resection with decreased surrounding edema and mass-effect.  Resolution of midline shift.  Patient with ongoing conservative care and he was maintained on low-dose Decadron therapy.  He was cleared to continue his subcutaneous heparin as directed.  Placed on sodium chloride tablets for hyponatremia with latest sodium level 132.  Mental status improved ammonia level 25.     Complete NIHSS TOTAL: 2   Patient's [facility]  has been reviewed by the rehabilitation admission coordinator and physician.   Past Medical History      Past Medical History:  Diagnosis Date   Allergic rhinitis      gets shots per Dr. Velora Heckler   Allergy      sees Dr. Harold Hedge   Asthma     Glaucoma      sees Dr. Satira Sark   Hypertension     Malignant melanoma [facility])      right lower leg, diagnosed on1/21/11      Family History   family history includes  Cancer in his brother; Colon polyps in his mother; Diabetes in his father; Hypertension in his mother and another family member; Liver disease in his maternal uncle.   Prior Rehab/Hospitalizations Has the patient had prior rehab or hospitalizations prior to admission? Yes CIR 6/7 until 04/18/2021 Has the patient had major surgery during 100 days prior to admission? Yes              Current Medications   Current Facility-Administered Medications:   0.9 %  sodium chloride infusion, , Intravenous, Continuous, Meyran, Ocie Cornfield, NP, Last Rate: 50 mL/hr at 04/20/21 0401, New Bag at 04/20/21 0401   acetaminophen (TYLENOL) tablet 650 mg, 650 mg, Oral, Q4H PRN, Meyran, Ocie Cornfield, NP   albuterol (VENTOLIN HFA) 108 (90 Base) MCG/ACT inhaler 2 puff, 2 puff, Inhalation, Q4H PRN, Meyran, Ocie Cornfield, NP   amLODipine (NORVASC) tablet 5 mg, 5 mg, Oral, Daily, Meyran, Ocie Cornfield, NP, 5 mg at 04/20/21 0934   budesonide (PULMICORT) nebulizer solution 0.25 mg, 0.25 mg, Nebulization, BID, Meyran, Ocie Cornfield, NP, 0.25 mg at 04/21/21 8453   Chlorhexidine Gluconate Cloth 2 % PADS 6 each, 6 each, Topical, Q0600, Meyran, Ocie Cornfield, NP, 6 each at 04/21/21 [phone]   dexamethasone (DECADRON) injection 2 mg, 2 mg, Intravenous, Q12H, Dawley, Troy C, DO   docusate sodium (COLACE) capsule 100 mg, 100 mg, Oral, BID PRN, Meyran, Ocie Cornfield, NP   heparin injection 5,000 Units, 5,000 Units, Subcutaneous, Q12H, Dawley, Troy C, DO, 5,000 Units at 04/20/21 2116   hydrALAZINE (APRESOLINE) injection 10 mg, 10 mg, Intravenous, Q4H PRN, Meyran, Ocie Cornfield, NP   irbesartan (AVAPRO) tablet 300 mg, 300 mg, Oral, Daily, 300 mg at 04/20/21 0934 **AND** hydrochlorothiazide (HYDRODIURIL) tablet 25 mg, 25 mg, Oral, Daily, Dawley, Troy C, DO, 25 mg at 04/20/21 0934   latanoprost (XALATAN) 0.005 % ophthalmic solution 1 drop, 1 drop, Both Eyes, QHS, Meyran, Ocie Cornfield, NP, 1 drop at 04/20/21 2113    levETIRAcetam (KEPPRA) tablet 250 mg, 250 mg, Oral, BID, Dawley, Troy C, DO   methylphenidate (RITALIN) tablet 5 mg, 5 mg, Oral, BID WC, Dawley, Troy C, DO   ondansetron (ZOFRAN) injection 4 mg, 4 mg, Intravenous, Q6H PRN, Meyran, Ocie Cornfield, NP   pantoprazole (PROTONIX) EC tablet 40 mg, 40 mg, Oral, Daily, Henri Medal, RPH,  40 mg at 04/20/21 2119   polyethylene glycol (MIRALAX / GLYCOLAX) packet 17 g, 17 g, Oral, Daily PRN, Meyran, Ocie Cornfield, NP   sodium chloride 0.9 % bolus 1,000 mL, 1,000 mL, Intravenous, Once, Meyran, Ocie Cornfield, NP   sodium chloride tablet 2 g, 2 g, Oral, BID WC, Dawley, Troy C, DO, 2 g at 04/20/21 1755   Patients Current Diet:  Diet Order                  Diet regular Room service appropriate? Yes; Fluid consistency: Thin  Diet effective now                         Precautions / Restrictions Precautions Precautions: Fall Precaution Comments: R crani Restrictions Weight Bearing Restrictions: No    Has the patient had 2 or more falls or a fall with injury in the past year? Yes   Prior Activity Level Community (5-7x/wk): wife drives recently   Prior Functional Level Self Care: Did the patient need help bathing, dressing, using the toilet or eating? Recent decline in function needing assistance. Went from  using cane progressing to RW for ambulation intermittently PTA, recent history of multiple falls over the past 6 weeks. Prior to 6 weeks ago, pt biking 20+ miles on bike.     Indoor Mobility: Did the patient need assistance with walking from room to room (with or without device)? Independent   Stairs: Did the patient need assistance with internal or external stairs (with or without device)? Independent   Functional Cognition: Did the patient need help planning regular tasks such as shopping or remembering to take medications? [place] / Equipment Home Equipment: Walker - 2 wheels, Maili - single point, Hand  held shower head   Prior Device Use: Indicate devices/aids used by the patient prior to current illness, exacerbation or injury? cane   Current Functional Level Cognition   Overall Cognitive Status: Impaired/Different from baseline Current Attention Level: Focused Orientation Level: Oriented to person, Disoriented to place, Disoriented to time, Disoriented to situation Following Commands: Follows one step commands inconsistently Safety/Judgement: Decreased awareness of safety, Decreased awareness of deficits General Comments: Pt oriented to self;  name and eventually stating date 04/20/2021 due to it being his 53rd wedding anniversary. Pt stating "June 1st, 2024 and June 01, 2023" Pt tolerating session well and following 1 step commands with multimodal cues. Pt naming household objects (utensils and hair brush with ability to use for intended purpose.)    Extremity Assessment (includes Sensation/Coordination)   Upper Extremity Assessment: Generalized weakness  Lower Extremity Assessment: Generalized weakness     ADLs   Overall ADL's : Needs assistance/impaired Eating/Feeding: Set up, Supervision/ safety, Sitting Grooming: Wash/dry hands, Standing Upper Body Bathing: Moderate assistance, Sitting Lower Body Bathing: Maximal assistance, Sitting/lateral leans, Sit to/from stand Upper Body Dressing : Moderate assistance, Sitting Lower Body Dressing: Maximal assistance, Sitting/lateral leans, Sit to/from stand Toilet Transfer: Maximal assistance, Cueing for safety Toilet Transfer Details (indicate cue type and reason): unable to test Toileting- Clothing Manipulation and Hygiene: Maximal assistance, Sit to/from stand Functional mobility during ADLs: Maximal assistance, +2 for physical assistance, +2 for safety/equipment (Pt requiring assist to scoot towards HOB in supine and to scoot pt closer to middle of bed.) General ADL Comments: Pt having s/s of lethargy and confusion, but able to follow  commands with mobility and ADL tasks. Pt set-upA for self feeding as lunch tray had just  come. Pt was fatigued and wanting to eat in bed. +2 assist required for transfer.     Mobility   Overal bed mobility: Needs Assistance Bed Mobility: Supine to Sit, Sit to Supine, Rolling Rolling: Max assist Supine to sit: Mod assist Sit to supine: Max assist General bed mobility comments: Pt with difficulty sequencing through bed mobility to EOB with modA and maxA for returning to supine.     Transfers   Overall transfer level: Needs assistance Equipment used: Rolling walker (2 wheeled) Transfers: Sit to/from Stand Sit to Stand: Mod assist, From elevated surface General transfer comment: DNT as meal tray came in     Ambulation / Gait / Stairs / Wheelchair Mobility         Posture / Balance Dynamic Sitting Balance Sitting balance - Comments: Posterior and left lateral lean. Requiring modA to maxA for lateral lean Balance Overall balance assessment: Needs assistance, History of Falls Sitting-balance support: Feet supported Sitting balance-Leahy Scale: Poor Sitting balance - Comments: Posterior and left lateral lean. Requiring modA to maxA for lateral lean     Special needs/care consideration Fall risk due to decreased safety awareness    Previous Home Environment  Living Arrangements: Horton/significant other Available Help at Discharge: Family, Available 24 hours/day Type of Home: [place]: One level, Other (Comment) Home Access: Stairs to enter Entrance Stairs-Rails: None Entrance Stairs-Number of Steps: 1 Bathroom Shower/Tub: Walk-in shower, Tub/shower unit (walk in shower has door, tub has curtain) [employer] forces training and education officer: Yes How Accessible: Accessible via walker [place]: No   Discharge Living Setting Plans for Discharge Living Setting: Patient's home Type of Home at Discharge: House Discharge Home Layout: One level, Other  (Comment) Discharge Home Access: Stairs to enter Entrance Stairs-Rails: None Entrance Stairs-Number of Steps: 1 Discharge Bathroom Shower/Tub: Walk-in shower, Tub/shower unit Discharge Bathroom Toilet: Standard Discharge Bathroom Accessibility: Yes How Accessible: Accessible via walker Does the patient have any problems obtaining your medications?: No   Social/Family/Support Systems Patient Roles: Horton, Parent Contact Information: wife, [name] Anticipated Caregiver: Wife Anticipated Caregiver's Contact Information: see above Ability/Limitations of Caregiver: Wife will provide 24/7 care. Sons will provide asssistance PRN. Caregiver Availability: 24/7 Discharge Plan Discussed with Primary Caregiver: Yes Is Caregiver In Agreement with Plan?: Yes Does Caregiver/Family have Issues with Lodging/Transportation while Pt is in Rehab?: No   Goals Supervision with PT , OT and SLP ELOS 2 to 3 weeks   Decrease burden of Care through IP rehab admission: n/a   Possible need for SNF placement upon discharge: not anticipated   Patient Condition: I have reviewed medical records from [facility], spoken with CM, and Horton. I met with patient at the bedside for inpatient rehabilitation assessment.  Patient will benefit from ongoing PT, OT, and SLP, can actively participate in 3 hours of therapy a day 5 days of the week, and can make measurable gains during the admission.  Patient will also benefit from the coordinated team approach during an Inpatient Acute Rehabilitation admission.  The patient will receive intensive therapy as well as Rehabilitation physician, nursing, social worker, and care management interventions.  Due to bladder management, bowel management, safety, skin/wound care, disease management, medication administration, pain management, and patient education the patient requires 24 hour a day rehabilitation nursing.  The patient is currently mod to max assist overall with mobility  and basic ADLs.  Discharge setting and therapy post discharge at home with home health is anticipated.  Patient has agreed to participate  in the Acute Inpatient Rehabilitation Program and will admit today.   Preadmission Screen Completed By:  Cleatrice Burke, 04/21/2021 11:27 AM ______________________________________________________________________   Discussed status with Dr. Ranell Patrick on  04/21/2021 at 1127 and received approval for admission today.   Admission Coordinator:  Cleatrice Burke, RN, time  3736 Date  04/21/2021   Assessment/Plan: Diagnosis: IVH Does the need for close, 24 hr/day Medical supervision in concert with the patient's rehab needs make it unreasonable for this patient to be served in a less intensive setting? Yes Co-Morbidities requiring supervision/potential complications: melanoma of trunk and right leg, essential HTN, allergic rhinitis, asthma, malignant frontal lobe tumor Due to bladder management, bowel management, safety, skin/wound care, disease management, medication administration, pain management, and patient education, does the patient require 24 hr/day rehab nursing? Yes Does the patient require coordinated care of a physician, rehab nurse, PT, OT, and SLP to address physical and functional deficits in the context of the above medical diagnosis(es)? Yes Addressing deficits in the following areas: balance, endurance, locomotion, strength, transferring, bowel/bladder control, bathing, dressing, feeding, grooming, toileting, cognition, speech, language, and psychosocial support Can the patient actively participate in an intensive therapy program of at least 3 hrs of therapy 5 days a week? Yes The potential for patient to make measurable gains while on inpatient rehab is excellent Anticipated functional outcomes upon discharge from inpatient rehab: supervision PT, supervision OT, supervision SLP Estimated rehab length of stay to reach the above functional  goals is: 2-3 weeks Anticipated discharge destination: Home 10. Overall Rehab/Functional Prognosis: excellent     MD Signature: Leeroy Cha, MD          Revision History                             Note Details  Author Izora Ribas, MD File Time 04/21/2021 11:32 AM  Author Type Physician Status Signed  Last Editor Izora Ribas, MD Service Physical Medicine and Rehabilitation

## 2021-04-21 NOTE — Progress Notes (Signed)
Inpatient Rehabilitation Admissions Coordinator   I have insurance approval and can admit to [facility] today. I met with patient at bedside with his sister, contacted Dr. Reatha Armour, and spoke with his wife by phone. I will make the arrangements to admit today.  Danne Baxter, RN, MSN Rehab Admissions Coordinator [phone] 04/21/2021 11:20 AM

## 2021-04-21 NOTE — Progress Notes (Signed)
INPATIENT REHABILITATION ADMISSION NOTE   Arrival Method: via bed      Mental Orientation: Oriented to person   Assessment: Completed   Skin: Abrasion to right arm, bruising to abdomen   IV'S: Right anterior forearm   Pain: No pain reported   Tubes and Drains: N/A   Safety Measures: Bed alarm activated   Vital Signs: Completed   Height and Weight: Completed   Rehab Orientation: Completed   Family: Notified    Notes: N/A

## 2021-04-21 NOTE — Progress Notes (Signed)
Physical Therapy Treatment Patient Details Name: Shannon Horton. MRN: [id number] DOB: September 23, 1946 Today's Date: 04/21/2021    History of Present Illness 75 yo male presents to [place] on 5/26 with headaches, balance issues with falls x4-6 weeks. MRI brain shows Large, 3.8 cm enhancing and hemorrhagic mass within the right basal ganglia/frontal lobe with 52mm L midline shift, concern for metastasis. CT chest/abdomen/pelvis shows L upper pulmonary nodul, L hilar adenopathy, RUL pulmonary nodule, upper quadrant mass arising from R adrenal gland, hepatic lesions. s/p CT guided biopsy R adrenal mass on 5/31 with pathology + for malignant neoplasm, oncology following. s/p R frontal craniotomy 6/1. Discharged to CIR and then readmitted to acute after increased confusion and lethargy. CT brain revealed stable postsurgical resection cavity, small L IVH. PMH includes glaucoma, HTN, malignant melanoma 2011.    PT Comments    Pt progressing towards his physical therapy goals, demonstrating improved command following and initiation today. Received with bowel incontinence; pt assisted in rolling to R/L for peri care. Pt requiring min-mod assist for bed mobility. Stood from edge of bed x 3 with recliner in front to promote anterior weight shift. Demonstrates "crouched" posture with increased bilateral knee flexion. Continue to recommend comprehensive inpatient rehab (CIR) for post-acute therapy needs.    Follow Up Recommendations  CIR     Equipment Recommendations  3in1 (PT);Wheelchair (measurements PT);Wheelchair cushion (measurements PT)    Recommendations for Other Services       Precautions / Restrictions Precautions Precautions: Fall Precaution Comments: R crani Restrictions Weight Bearing Restrictions: No    Mobility  Bed Mobility Overal bed mobility: Needs Assistance Bed Mobility: Supine to Sit;Sit to Supine;Rolling Rolling: Min assist   Supine to sit: Min assist Sit to supine: Mod  assist   General bed mobility comments: Pt initiating, bringing BLE's off edge of bed, bringing trunk ~25% upright, with assist for last 75% to obtain midline. Assist for BLE's back into bed    Transfers Overall transfer level: Needs assistance Equipment used:  (back of recliner chair) Transfers: Sit to/from Stand Sit to Stand: Mod assist;From elevated surface;+2 physical assistance         General transfer comment: ModA + 2 to rise from edge of bed, pt pulling up with BUE's from back of recliner chair handles. Max multimodal cues for bilateral knee extension and upright posture.  Ambulation/Gait                 Stairs             Wheelchair Mobility    Modified Rankin (Stroke Patients Only)       Balance Overall balance assessment: Needs assistance;History of Falls Sitting-balance support: Feet supported Sitting balance-Leahy Scale: Poor Sitting balance - Comments: Posterior lean, improving to supervision with anterior support   Standing balance support: Bilateral upper extremity supported Standing balance-Leahy Scale: Poor                              Cognition Arousal/Alertness: Awake/alert Behavior During Therapy: Flat affect Overall Cognitive Status: Impaired/Different from baseline Area of Impairment: Attention;Safety/judgement;Problem solving;Following commands;Awareness;Orientation                 Orientation Level: Disoriented to;Place;Time;Situation Current Attention Level: Sustained Memory: Decreased short-term memory Following Commands: Follows one step commands with increased time Safety/Judgement: Decreased awareness of safety;Decreased awareness of deficits Awareness: Intellectual Problem Solving: Decreased initiation;Difficulty sequencing;Requires verbal cues General Comments: Improved command following and  initiation today. Asking what city we were in and seemed surprised to hear [place].      Exercises Other  Exercises Other Exercises: Sitting: forward "sit ups," with cues for "nose to recliner chair," which was in front of him x 6    General Comments        Pertinent Vitals/Pain Pain Assessment: Faces Faces Pain Scale: No hurt    Home Living                      Prior Function            PT Goals (current goals can now be found in the care plan section) Acute Rehab PT Goals Patient Stated Goal: did not state PT Goal Formulation: With patient Time For Goal Achievement: 05/04/21 Potential to Achieve Goals: Fair Progress towards PT goals: Progressing toward goals    Frequency    Min 4X/week      PT Plan Current plan remains appropriate    Co-evaluation              AM-PAC PT "6 Clicks" Mobility   Outcome Measure  Help needed turning from your back to your side while in a flat bed without using bedrails?: A Little Help needed moving from lying on your back to sitting on the side of a flat bed without using bedrails?: A Little Help needed moving to and from a bed to a chair (including a wheelchair)?: A Lot Help needed standing up from a chair using your arms (e.g., wheelchair or bedside chair)?: A Lot Help needed to walk in hospital room?: Total Help needed climbing 3-5 steps with a railing? : Total 6 Click Score: 12    End of Session Equipment Utilized During Treatment: Gait belt Activity Tolerance: Patient tolerated treatment well Patient left: in bed;with call bell/phone within reach;with bed alarm set Nurse Communication: Mobility status PT Visit Diagnosis: Other abnormalities of gait and mobility (R26.89);Difficulty in walking, not elsewhere classified (R26.2)     Time: 6803-2122 PT Time Calculation (min) (ACUTE ONLY): 20 min  Charges:  $Therapeutic Activity: 8-22 mins                     Wyona Almas, PT, DPT Acute Rehabilitation Services Pager [phone] Office [phone]    Deno Etienne 04/21/2021, 3:23 PM

## 2021-04-21 NOTE — Care Management Important Message (Signed)
Important Message  Patient Details  Name: Shannon Horton. MRN: [id number] Date of Birth: 03/28/1946   Medicare Important Message Given:  Yes     Guila Owensby 04/21/2021, 4:03 PM

## 2021-04-21 NOTE — Discharge Summary (Addendum)
Physician Discharge Summary  Patient ID: Shannon Horton. MRN: [id number] DOB/AGE: [birth year] 75 y.o.  Admit date: 04/18/2021 Discharge date: 04/21/2021  Admission Diagnoses:  AMS Metastatic melanoma, s/p right frontal crani for tumor Hyponatremia Hypokalemia  Discharge Diagnoses:  Same Active Problems:   IVH (intraventricular hemorrhage) [facility])   Discharged Condition: Stable  Hospital Course:  Abraham Entwistle. is a 75 y.o. male that presented while in CIR with lethargy and lack of responsiveness. He was transferred for evaluation. 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 No hydrocephalus or acute finding. His Na and K were low and replaced. His mentation improved and he became more responsive and interactive in order to work with therapies. His incision was healing well. PT/OT eval and agreed for return to CIR. His electrolytes were improving. His mentation was significantly improved and he was tolerating a normal diet and having normal bowel and bladder function.  Treatments: Electrolyte replacement  Discharge Exam: Blood pressure 135/76, pulse (!) 55, temperature 97.6 F (36.4 C), temperature source Axillary, resp. rate 15, weight 70.5 kg, SpO2 100 %. Awake, alert, oriented x2 Speech fluent CN grossly intact 4/5 BUE/BLE Wound c/d/I  Disposition:  Discharge disposition: 90-DC/txfr to inpt rehab facility with planned acute care hosp IP admission       Allergies as of 04/21/2021       Reactions   Banana Rash        Medication List     TAKE these medications    albuterol 108 (90 Base) MCG/ACT inhaler Commonly known as: VENTOLIN HFA Inhale 2 puffs into the lungs every 4 (four) hours as needed for wheezing or shortness of breath.   amLODipine 5 MG tablet Commonly known as: NORVASC Take 1 tablet (5 mg total) by mouth daily.   Arnuity Ellipta 200 MCG/ACT Aepb Generic drug: Fluticasone Furoate Inhale 1 puff into the  lungs daily.   bimatoprost 0.03 % ophthalmic solution Commonly known as: LUMIGAN Place 1 drop into both eyes at bedtime.   brinzolamide 1 % ophthalmic suspension Commonly known as: AZOPT Place 1 drop into both eyes 2 (two) times daily.   dexamethasone 1 MG tablet Commonly known as: DECADRON Take 2 tablets (2 mg total) by mouth every 12 (twelve) hours. What changed: how much to take   HYDROcodone-acetaminophen 5-325 MG tablet Commonly known as: NORCO/VICODIN Take 1 tablet by mouth every 4 (four) hours as needed for moderate pain.   levETIRAcetam 250 MG tablet Commonly known as: KEPPRA Take 1 tablet (250 mg total) by mouth 2 (two) times daily. What changed:  medication strength how much to take   methylphenidate 5 MG tablet Commonly known as: RITALIN Take 1 tablet (5 mg total) by mouth 2 (two) times daily with breakfast and lunch.   pantoprazole 40 MG tablet Commonly known as: PROTONIX Take 1 tablet (40 mg total) by mouth at bedtime.   sodium chloride 1 g tablet Take 2 tablets (2 g total) by mouth 2 (two) times daily with a meal.   telmisartan-hydrochlorothiazide 80-25 MG tablet Commonly known as: MICARDIS HCT TAKE 1 TABLET BY MOUTH EVERY DAY        Follow-up Information     Noel Rodier C, DO Follow up in 1 month(s).   Contact information: [address] [place] [address] [phone]                 Signed: Theodoro Doing Saliou Barnier 04/22/2021, 12:05 PM  ADDENDUM: corrected DC  date changed to 04/21/2021

## 2021-04-21 NOTE — Progress Notes (Signed)
[name], MD   Physician  Physical Medicine and Rehabilitation  Consult Note     Signed  Date of Service:  04/02/2021  1:15 PM       Related encounter: ED to Hosp-Admission (Discharged) from 03/25/2021 in [place] All Collapse All     Show:Clear all [x] Written[x] Templated[] Copied  Added by: [x] [name], MD   [] Hover for details                                                                                                                                                                                                                                                                                                                                                                             Physical Medicine and Rehabilitation Consult Reason for Consult:balance problems. Left sided weakness Referring Physician: [place]     HPI: Shannon Horton. is a 75 y.o. male who presented to [facility] on May 26 with headaches and balance deficits for 4 to 6 weeks.  MRI revealed a 3.8 cm enhancing and hemorrhagic mass in the right basal ganglia/frontal lobe region with 5 mm left midline shift.  CT of the chest and abdomen was notable for left upper pulmonary nodule, left hilar adenopathy right upper lobe pulmonary nodule left lower quadrant mass arising from the right adrenal gland, hepatic lesions.  CT-guided biopsy of the adrenal mass on 5/31 was positive for malignancy.  Oncology is following.  Patient underwent a right frontal craniotomy on 6/1.     ROS     Past Medical History:  Diagnosis Date   Allergic rhinitis  gets shots per Dr. Velora Heckler   Allergy      sees Dr. Harold Hedge   Asthma     Glaucoma      sees Dr. Satira Sark   Hypertension     Malignant melanoma [facility])      right lower  leg, diagnosed on1/21/11         Past Surgical History:  Procedure Laterality Date   APPLICATION OF CRANIAL NAVIGATION N/A 03/31/2021    Procedure: APPLICATION OF CRANIAL NAVIGATION;  Surgeon: Karsten Ro, DO;  Location: [place];  Service: Neurosurgery;  Laterality: N/A;   CATARACT EXTRACTION        right ey, Dr. Satira Sark   COLONOSCOPY   12/15/2015    per Dr.Kaplan, clear, no repeats   CRANIOTOMY N/A 03/31/2021    Procedure: RIGHT FRONTAL CRANIOTOMY FOR TUMOR;  Surgeon: Karsten Ro, DO;  Location: [place];  Service: Neurosurgery;  Laterality: N/A;   ESOPHAGOGASTRODUODENOSCOPY        WITH ESOPHAGEAL DILATION 2006 PER dR. kAPLAN   REMOVAL  of melanoma   12/08/09    from right calf; per Dr. Monica Becton   removal of melanoma from left abdomen   01/21/10    per Dr. Denice Bors CELL CARCINOMA EXCISION   2013    per Dr. Terrence Dupont, from top of scalp          Family History  Problem Relation Age of Onset   Hypertension Other     Colon polyps Mother     Hypertension Mother     Diabetes Father     Liver disease Maternal Uncle     Cancer Brother          throat and lung (smoker)   Colon cancer Neg Hx     Stomach cancer Neg Hx     Esophageal cancer Neg Hx     Pancreatic cancer Neg Hx      Social History:  reports that he has quit smoking. He has never used smokeless tobacco. He reports current alcohol use of about 5.0 - 10.0 standard drinks of alcohol per week. He reports that he does not use drugs. Allergies:      Allergies  Allergen Reactions   Banana Rash          Medications Prior to Admission  Medication Sig Dispense Refill   acetaminophen (TYLENOL) 500 MG tablet Take 1,000 mg by mouth every 6 (six) hours as needed for mild pain.       albuterol (PROVENTIL HFA;VENTOLIN HFA) 108 (90 Base) MCG/ACT inhaler Inhale 2 puffs into the lungs every 4 (four) hours as needed for wheezing or shortness of breath. 8.5 Inhaler 0   amLODipine (NORVASC) 5 MG tablet Take 1 tablet (5 mg total)  by mouth daily. 90 tablet 3   aspirin 325 MG EC tablet Take 325 mg by mouth every 6 (six) hours as needed for pain (alt with ibuprofen).       aspirin 81 MG EC tablet Take 81 mg by mouth daily.       bimatoprost (LUMIGAN) 0.03 % ophthalmic solution Place 1 drop into both eyes at bedtime.       brinzolamide (AZOPT) 1 % ophthalmic suspension Place 1 drop into both eyes 2 (two) times daily.       Coenzyme Q10 200 MG capsule Take 200 mg by mouth daily.       EPINEPHrine 0.3 mg/0.3 mL IJ SOAJ injection Inject 0.3 mg into the muscle  See admin instructions.       Fluticasone Furoate (ARNUITY ELLIPTA) 200 MCG/ACT AEPB Inhale 1 puff into the lungs daily.       ibuprofen (ADVIL) 200 MG tablet Take 200-400 mg by mouth every 6 (six) hours as needed for headache.       Multiple Vitamin (MULTIVITAMIN) tablet Take 1 tablet by mouth daily. One A Day       telmisartan-hydrochlorothiazide (MICARDIS HCT) 80-25 MG tablet TAKE 1 TABLET BY MOUTH EVERY DAY 90 tablet 3   UNABLE TO FIND Allergy shots at [facility] every month          Home: [place] expects to be discharged to:: Private residence Living Arrangements: Spouse/significant other Available Help at Discharge: Family,Available 24 hours/day Type of Home: House Home Access: Stairs to enter [employer] of Steps: 1 Entrance Stairs-Rails: Right Home Layout: Two level,Able to live on main level with bedroom/bathroom Bathroom Shower/Tub: Multimedia programmer: Standard Bathroom Accessibility: Yes Home Equipment: Environmental consultant - 2 wheels,Cane - single point,Hand held shower head  Functional History: Prior Function Level of Independence: Independent with assistive device(s) Comments: pt reports using cane progressing to RW for ambulation intermittently PTA, recent history of multiple falls over the past 6 weeks. Prior to 6 weeks ago, pt biking 20+ miles on bike. Functional Status:  Mobility: Bed  Mobility Overal bed mobility: Needs Assistance Bed Mobility: Supine to Sit Supine to sit: Min assist,HOB elevated,+2 for safety/equipment General bed mobility comments: min assist for completion of trunk elevation off of bed, progression LEs to EOB. Increased time, sequential cuing needed. Transfers Overall transfer level: Needs assistance Equipment used: 2 person hand held assist Transfers: Sit to/from [place] to Stand: Mod assist,+2 physical assistance Stand pivot transfers: Mod assist,+2 physical assistance General transfer comment: Mod +2 for power up, rise, steadying, and slow eccentric lower into recliner; cues for upright posture and "big steps" when pivoting to recliner as pt with shuffling-type gait. Ambulation/Gait General Gait Details: transfer only   ADL: ADL Overall ADL's : Needs assistance/impaired Eating/Feeding: Set up,Supervision/ safety,Sitting Grooming: Set up,Supervision/safety,Sitting Upper Body Bathing: Set up,Sitting Lower Body Bathing: Moderate assistance,Sit to/from stand Upper Body Dressing : Minimal assistance,Sitting Lower Body Dressing: Moderate assistance,Sit to/from stand Toilet Transfer: Moderate assistance,+2 for physical assistance,Stand-pivot (took several steps) Toileting- Clothing Manipulation and Hygiene: Moderate assistance Functional mobility during ADLs: Moderate assistance,+2 for safety/equipment   Cognition: Cognition Overall Cognitive Status: Impaired/Different from baseline Orientation Level: Oriented X4 Cognition Arousal/Alertness: Awake/alert Behavior During Therapy: Flat affect Overall Cognitive Status: Impaired/Different from baseline Area of Impairment: Attention,Following commands,Safety/judgement,Problem solving Current Attention Level: Sustained Memory: Decreased short-term memory Following Commands: Follows one step commands with increased time Safety/Judgement: Decreased awareness of  safety,Decreased awareness of deficits Awareness: Emergent Problem Solving: Slow processing,Decreased initiation,Difficulty sequencing,Requires verbal cues,Requires tactile cues General Comments: Pt with increased processing time to initate tasks and verbally respond to PT/OT. Pt with heavy L lateral leaning sitting EOB, requires repeated cuing to both be aware of and correct this. Step-by-step cuing for mobility tasks   Blood pressure (!) 127/59, pulse 64, temperature (!) 97.4 F (36.3 C), temperature source Oral, resp. rate 13, height 5\' 8"  (1.727 m), weight 70.5 kg, SpO2 97 %. Physical Exam   Lab Results Last 24 Hours  No results found for this or any previous visit (from the past 24 hour(s)).    Imaging Results (Last 48 hours)  MR BRAIN W WO CONTRAST   Result Date: 04/01/2021 CLINICAL  DATA:  Postop day 1 status post resection of a right basal ganglia region mass. Recent adrenal biopsy demonstrating metastatic melanoma. EXAM: MRI HEAD WITHOUT AND WITH CONTRAST TECHNIQUE: Multiplanar, multiecho pulse sequences of the brain and surrounding structures were obtained without and with intravenous contrast. CONTRAST:  24mL GADAVIST GADOBUTROL 1 MMOL/ML IV SOLN COMPARISON:  03/26/2021 FINDINGS: Brain: Sequelae of interval right frontal craniotomy and mass resection are identified. There is a resection cavity in the right basal ganglia containing blood products with surgical tract extending through the anterolateral frontal lobe. There is a small amount of partially nodular intrinsic T1 hyperintensity along the margins of the resection cavity suggesting blood products. Faint enhancement is also present along the periphery of the resection cavity. There is a 1.8 cm region of intensely restricted diffusion immediately superior to the resection cavity involving portions of the right corona radiata and caudate nucleus compatible with an acute infarct. Small volume pneumocephalus is noted, and there is a small  extra-axial fluid collection over the right frontal convexity subjacent to the craniotomy measuring 7 mm in thickness. There is moderate residual edema throughout the right basal ganglia region extending into the frontal greater than temporal lobes which has decreased from the prior study. 5 mm of leftward midline shift is unchanged. A 5 mm lesion in the right caudate head demonstrating both intrinsic T1 hyperintensity and enhancement is unchanged (series 10, image 33). The smaller enhancing focus in the right caudate nucleus slightly more inferiorly on the prior MRI is less conspicuous on today's study. There is 6 mm heterogeneously enhancing nodular focus in the choroid plexus in the left lateral ventricle at the level of the inferior atrium/posterior temporal horn (series 10, image 26) which is asymmetric compared to the contralateral side. This is unchanged in size from the prior MRI, however there is new susceptibility artifact associated with this lesion consistent with hemorrhage, and there is new trace intraventricular hemorrhage layering in the occipital horn of the left lateral ventricle. Scattered small T2 hyperintensities in the cerebral white matter bilaterally are nonspecific but compatible with mild chronic small vessel ischemic disease. There is mild cerebral atrophy. Vascular: Major intracranial vascular flow voids are preserved. Skull and upper cervical spine: Right frontal craniotomy with gas and fluid in the scalp. Sinuses/Orbits: Bilateral cataract extraction. Paranasal sinuses and mastoid air cells are clear. Other: None. IMPRESSION: 1. Postoperative changes from interval right basal ganglia tumor resection as above. This will serve as a baseline for future examinations. 2. 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 3. Decreased edema in the right basal ganglia region. Unchanged 5 mm a midline shift. 4. Unchanged 5 mm right caudate lesion. 5. 6 mm nodular hemorrhagic lesion in the  choroid plexus of the left lateral ventricle suspicious for a metastasis with trace intraventricular hemorrhage. Electronically Signed   By: Logan Bores M.D.   On: 04/01/2021 15:09      Assessment/Plan: Diagnosis: Left hemiparesis, visual deficits, and functional deficits d/t metastatic right basal ganglia mass s/p resection. Primary carcinoma is unknown. Does the need for close, 24 hr/day medical supervision in concert with the patient's rehab needs make it unreasonable for this patient to be served in a less intensive setting? Yes Co-Morbidities requiring supervision/potential complications: pain mgt, oncology considerations, HTN Due to bladder management, bowel management, safety, skin/wound care, disease management, medication administration, pain management and patient education, does the patient require 24 hr/day rehab nursing? Yes Does the patient require coordinated care of a physician, rehab nurse, therapy disciplines  of PT, OT , SLP to address physical and functional deficits in the context of the above medical diagnosis(es)? Yes Addressing deficits in the following areas: balance, endurance, locomotion, strength, transferring, bowel/bladder control, bathing, dressing, feeding, grooming, toileting and psychosocial support Can the patient actively participate in an intensive therapy program of at least 3 hrs of therapy per day at least 5 days per week? Yes The potential for patient to make measurable gains while on inpatient rehab is good Anticipated functional outcomes upon discharge from inpatient rehab are supervision  with PT, supervision with OT, supervision with SLP. Estimated rehab length of stay to reach the above functional goals is: 13-20 days Anticipated discharge destination: Home Overall Rehab/Functional Prognosis: good   RECOMMENDATIONS: This patient's condition is appropriate for continued rehabilitative care in the following setting: CIR Patient has agreed to participate  in recommended program. Potentially Note that insurance prior authorization may be required for reimbursement for recommended care.   Comment: [name], MD, [place] [facility] 04/02/2021      [name], MD 04/02/2021             Routing History                        Note Details  Author [name], MD File Time 04/02/2021  1:21 PM  Author Type Physician Status Signed  Last Editor [name], MD Service Physical Medicine and Rehabilitation

## 2021-04-21 NOTE — Progress Notes (Signed)
   Providing Compassionate, Quality Care - Together  NEUROSURGERY PROGRESS NOTE   S: No issues overnight. Sleepy this am  O: EXAM:  BP 136/86 (BP Location: Left Arm)   Pulse 63   Temp 97.8 F (36.6 C) (Axillary)   Resp 18   Wt 70.5 kg   SpO2 98%   BMI 23.63 kg/m   Lethargic, opens eyes to voice, falls back asleep States one word  Incision c/d/i MAE equally   ASSESSMENT:  75 y.o. male with   Metastatic Melanoma S/p crani for resection of right frontal tumor   2. Hyponatremia, improving 3. Hypokalemia, improving   PLAN: - cont Na tabs - rehab planning - may benefit from stimulant to be interactive for therapies, weaning keppra and increasing decadron    Thank you for allowing me to participate in this patient's care.  Please do not hesitate to call with questions or concerns.   Elwin Sleight, [facility] Cell: [phone]

## 2021-04-21 NOTE — H&P (Signed)
Physical Medicine and Rehabilitation Admission H&P     HPI: Shannon Horton is a 75 year old right-handed male with history of glaucoma followed by Dr. 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  Per chart review lives with spouse.  Two-level home bed and bath main level one-step to entry.  Independent with assistive device and reported multiple falls over the past 6 weeks.  Prior to 6 weeks ago patient independent and active.  Presented 03/25/2021 with persistent headaches balance deficits x6 weeks, left hemiparesis as well as shuffling gait.  MRI of the brain showed a 3.8 cm enhancing and hemorrhagic mass in the right basal ganglia/frontal lobe region with a 5 mm left midline shift.  CT of the chest and abdomen was notable for left upper extremity pulmonary nodule, left hilar adenopathy right upper lobe pulmonary nodule left lower quadrant mass arising from the right adrenal gland, hepatic lesions.  CT-guided biopsy of the adrenal mass 03/30/2021 positive for malignancy.  Patient underwent right craniotomy resection of tumor 03/31/2021 per Dr. Reatha Armour.  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  Brain surgical tissue pathology remain pending follow-up per oncology services Dr.Gudena as well as Dr. Alen Blew.  Decadron protocol as indicated.  Maintained on Keppra for seizure prophylaxis.  Patient was cleared to begin subcutaneous heparin for DVT prophylaxis 04/02/2021.  Tolerating a regular consistency diet.  Therapy evaluations completed due to patient decreased functional mobility left side weakness was admitted to inpatient rehab services 04/06/2021.  Patient with slow progressive gains while on rehab services.  He did receive followed by neuropsychology for hospital course depression and emotional support provided.  Wife and family did note some altered mental status changes 04/17/2021  with urinalysis completed negative nitrite, CBC unremarkable and latest chemistry sodium of 130.  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  No definite acute ventriculomegaly or transependymal edema.  Contact made to neurosurgery due to these acute findings patient was discharged to acute care services for ongoing monitoring.  MRI of the brain completed 04/19/2021 showing acute left occipital horn intraventricular hemorrhage similar to recent CT of the head.  Mild increase in ventricular size relative to prior MRI.  Evolving changes of right basal ganglia tumor resection with decreased surrounding edema and mass-effect.  Resolution of midline shift.  Patient with ongoing conservative care and he was maintained on low-dose Decadron therapy.  He was cleared to continue his subcutaneous heparin as directed.  Placed on sodium chloride tablets for hyponatremia with latest sodium level 132.  Mental status improved ammonia level 25.  Patient's therapy is resumed and was readmitted back to inpatient rehab services for comprehensive therapies. Demonstrating dysarthric speech.   Review of Systems  Constitutional:  Positive for malaise/fatigue.  HENT:  Negative for hearing loss.   Eyes:  Positive for blurred vision.  Respiratory:  Negative for cough and shortness of breath.   Cardiovascular:  Negative for chest pain, palpitations and leg swelling.  Gastrointestinal:  Positive for constipation. Negative for heartburn, nausea and vomiting.  Genitourinary:  Negative for dysuria, flank pain and hematuria.  Musculoskeletal:  Positive for falls and myalgias.  Skin:  Negative for rash.  Neurological:  Positive for dizziness and headaches.  All other systems reviewed and are negative. Past Medical History:  Diagnosis Date   Allergic  rhinitis    gets shots per Dr. Velora Heckler   Allergy    sees Dr. Harold Hedge     Asthma    Glaucoma    sees Dr. Satira Sark    Hypertension    Malignant melanoma [facility])    right lower leg, diagnosed on1/21/11   Past Surgical History:  Procedure Laterality Date   APPLICATION OF CRANIAL NAVIGATION N/A 03/31/2021   Procedure: APPLICATION OF CRANIAL NAVIGATION;  Surgeon: Karsten Ro, DO;  Location: [place];  Service: Neurosurgery;  Laterality: N/A;   CATARACT EXTRACTION     right ey, Dr. Satira Sark    COLONOSCOPY  12/15/2015   per Dr.Kaplan, clear, no repeats    CRANIOTOMY N/A 03/31/2021   Procedure: RIGHT FRONTAL CRANIOTOMY FOR TUMOR;  Surgeon: Karsten Ro, DO;  Location: [place];  Service: Neurosurgery;  Laterality: N/A;   ESOPHAGOGASTRODUODENOSCOPY     WITH ESOPHAGEAL DILATION 2006 PER dR. kAPLAN   REMOVAL  of melanoma  12/08/09   from right calf; per Dr. Monica Becton   removal of melanoma from left abdomen  01/21/10    per Dr. Denice Bors CELL CARCINOMA EXCISION  2013   per Dr. Terrence Dupont, from top of scalp    Family History  Problem Relation Age of Onset   Hypertension Other    Colon polyps Mother    Hypertension Mother    Diabetes Father    Liver disease Maternal Uncle    Cancer Brother        throat and lung (smoker)   Colon cancer Neg Hx    Stomach cancer Neg Hx    Esophageal cancer Neg Hx    Pancreatic cancer Neg Hx    Social History:  reports that he has quit smoking. He has never used smokeless tobacco. He reports current alcohol use of about 5.0 - 10.0 standard drinks of alcohol per week. He reports that he does not use drugs. Allergies:  Allergies  Allergen Reactions   Banana Rash   Medications Prior to Admission  Medication Sig Dispense Refill   albuterol (PROVENTIL HFA;VENTOLIN HFA) 108 (90 Base) MCG/ACT inhaler Inhale 2 puffs into the lungs every 4 (four) hours as needed for wheezing or shortness of breath. 8.5 Inhaler 0   amLODipine (NORVASC) 5 MG tablet Take 1 tablet (5 mg total) by mouth daily. 90 tablet 3   bimatoprost (LUMIGAN) 0.03  % ophthalmic solution Place 1 drop into both eyes at bedtime.     brinzolamide (AZOPT) 1 % ophthalmic suspension Place 1 drop into both eyes 2 (two) times daily.     dexamethasone (DECADRON) 1 MG tablet Take 1 tablet (1 mg total) by mouth every 12 (twelve) hours. 6 tablet 0   Fluticasone Furoate (ARNUITY ELLIPTA) 200 MCG/ACT AEPB Inhale 1 puff into the lungs daily.     HYDROcodone-acetaminophen (NORCO/VICODIN) 5-325 MG tablet Take 1 tablet by mouth every 4 (four) hours as needed for moderate pain. 30 tablet 0   levETIRAcetam (KEPPRA) 500 MG tablet Take 1 tablet (500 mg total) by mouth 2 (two) times daily. 60 tablet 1   pantoprazole (PROTONIX) 40 MG tablet Take 1 tablet (40 mg total) by mouth at bedtime. 30 tablet 0   telmisartan-hydrochlorothiazide (MICARDIS HCT) 80-25 MG tablet TAKE 1 TABLET BY MOUTH EVERY DAY 90 tablet 3    Drug Regimen Review Drug regimen was reviewed and remains appropriate with no significant issues identified  Home: Home Living Family/patient expects to be discharged to:: Private residence Living  Arrangements: Spouse/significant other Available Help at Discharge: Family, Available 24 hours/day Type of Home: House Home Access: Stairs to enter [employer] of Steps: 1 Entrance Stairs-Rails: None Home Layout: One level, Other (Comment) Bathroom Shower/Tub: Walk-in shower, Tub/shower unit (walk in shower has door, tub has curtain) Biochemist, clinical: Standard Bathroom Accessibility: Yes Home Equipment: Environmental consultant - 2 wheels, Cane - single point, Hand held shower head   Functional History: Prior Function Level of Independence: Independent with assistive device(s) Comments: Pt was working with CIR prior to this pt reports using cane progressing to RW for ambulation intermittently PTA, recent history of multiple falls over the past 6 weeks. Prior to 6 weeks ago, pt biking 20+ miles on bike.  Functional Status:  Mobility: Bed Mobility Overal bed mobility: Needs  Assistance Bed Mobility: Supine to Sit, Sit to Supine, Rolling Rolling: Max assist Supine to sit: Mod assist Sit to supine: Max assist General bed mobility comments: Pt with difficulty sequencing through bed mobility to EOB with modA and maxA for returning to supine. Transfers Overall transfer level: Needs assistance Equipment used: Rolling walker (2 wheeled) Transfers: Sit to/from Stand Sit to Stand: Mod assist, From elevated surface General transfer comment: DNT as meal tray came in      ADL: ADL Overall ADL's : Needs assistance/impaired Eating/Feeding: Set up, Supervision/ safety, Sitting Grooming: Wash/dry hands, Standing Upper Body Bathing: Moderate assistance, Sitting Lower Body Bathing: Maximal assistance, Sitting/lateral leans, Sit to/from stand Upper Body Dressing : Moderate assistance, Sitting Lower Body Dressing: Maximal assistance, Sitting/lateral leans, Sit to/from stand Toilet Transfer: Maximal assistance, Cueing for safety Toilet Transfer Details (indicate cue type and reason): unable to test Toileting- Clothing Manipulation and Hygiene: Maximal assistance, Sit to/from stand Functional mobility during ADLs: Maximal assistance, +2 for physical assistance, +2 for safety/equipment (Pt requiring assist to scoot towards HOB in supine and to scoot pt closer to middle of bed.) General ADL Comments: Pt having s/s of lethargy and confusion, but able to follow commands with mobility and ADL tasks. Pt set-upA for self feeding as lunch tray had just come. Pt was fatigued and wanting to eat in bed. +2 assist required for transfer.  Cognition: Cognition Overall Cognitive Status: Impaired/Different from baseline Orientation Level: Oriented to person, Disoriented to place, Disoriented to time, Disoriented to situation Cognition Arousal/Alertness: Awake/alert Behavior During Therapy: Restless Overall Cognitive Status: Impaired/Different from baseline Area of Impairment: Attention,  Safety/judgement, Problem solving, Orientation, Following commands, Awareness Orientation Level: Disoriented to, Place, Time, Situation Current Attention Level: Focused Memory: Decreased short-term memory Following Commands: Follows one step commands inconsistently Safety/Judgement: Decreased awareness of safety, Decreased awareness of deficits Awareness: Intellectual Problem Solving: Decreased initiation, Difficulty sequencing, Requires verbal cues General Comments: Pt oriented to self;  name and eventually stating date 04/20/2021 due to it being his 53rd wedding anniversary. Pt stating "June 1st, 2024 and June 01, 2023" Pt tolerating session well and following 1 step commands with multimodal cues. Pt naming household objects (utensils and hair brush with ability to use for intended purpose.)  Physical Exam: Blood pressure 136/86, pulse 63, temperature 97.8 F (36.6 C), temperature source Axillary, resp. rate 18, weight 70.5 kg, SpO2 98 %. Gen: no distress, normal appearing HEENT: oral mucosa pink and moist, NCAT Cardio: Reg rate Chest: normal effort, normal rate of breathing Abd: soft, non-distended Ext: no edema Psych: pleasant, normal affect Skin: intact Neuro: Musculoskeletal:  Neurological:     Comments: Patient is alert. He provides his name and age but needed multiple cues for place  and situation.  Limited awareness of deficits.  He does follow simple commands- 4/5 strength in upper extremities, moving right leg but will not move left. Dysarthric speech- difficult to understand. Makes eye contact with me and his sister  Results for orders placed or performed during the hospital encounter of 04/18/21 (from the past 48 hour(s))  Glucose, capillary     Status: Abnormal   Collection Time: 04/19/21  3:32 PM  Result Value Ref Range   Glucose-Capillary 132 (H) 70 - 99 mg/dL    Comment: Glucose reference range applies only to samples taken after fasting for at least 8 hours.  Basic  metabolic panel     Status: Abnormal   Collection Time: 04/20/21  2:24 AM  Result Value Ref Range   Sodium 131 (L) 135 - 145 mmol/L   Potassium 3.3 (L) 3.5 - 5.1 mmol/L   Chloride 96 (L) 98 - 111 mmol/L   CO2 26 22 - 32 mmol/L   Glucose, Bld 133 (H) 70 - 99 mg/dL    Comment: Glucose reference range applies only to samples taken after fasting for at least 8 hours.   BUN 10 8 - 23 mg/dL   Creatinine, Ser 0.56 (L) 0.61 - 1.24 mg/dL   Calcium 9.0 8.9 - 10.3 mg/dL   GFR, Estimated >60 >60 mL/min    Comment: (NOTE) Calculated using the CKD-EPI Creatinine Equation (2021)    Anion gap 9 5 - 15    Comment: Performed at [facility] [address], [place], [place] [address]  Basic metabolic panel     Status: Abnormal   Collection Time: 04/21/21  3:59 AM  Result Value Ref Range   Sodium 132 (L) 135 - 145 mmol/L   Potassium 3.4 (L) 3.5 - 5.1 mmol/L   Chloride 94 (L) 98 - 111 mmol/L   CO2 27 22 - 32 mmol/L   Glucose, Bld 134 (H) 70 - 99 mg/dL    Comment: Glucose reference range applies only to samples taken after fasting for at least 8 hours.   BUN 8 8 - 23 mg/dL   Creatinine, Ser 0.50 (L) 0.61 - 1.24 mg/dL   Calcium 9.1 8.9 - 10.3 mg/dL   GFR, Estimated >60 >60 mL/min    Comment: (NOTE) Calculated using the CKD-EPI Creatinine Equation (2021)    Anion gap 11 5 - 15    Comment: Performed at [facility] [address], [place], [place] [address]   No results found.     Medical Problem List and Plan: 1.  Left hemiparesis with visual deficits secondary to metastatic melanoma status postcraniotomy for resection of right frontal tumor 03/31/2021.  Pathology positive for malignant melanoma  -patient may shower  -ELOS/Goals: MinA 10-14 days 2.  Impaired mobility/s/p craniotomy: -DVT/anticoagulation: Continue Subcutaneous heparin 5000 units every 12 hours until cleared by NSGY. Please initiate heparin education to family.   -antiplatelet therapy: N/A 3. Pain Management:  Tylenol as needed 4. Impaired attention: Continue Ritalin 5 mg twice daily.  Provide emotional support  -antipsychotic agents: N/A 5. Neuropsych: This patient is not capable of making decisions on his own behalf. 6. Skin/Wound Care: Routine skin checks 7. Fluids/Electrolytes/Nutrition: Routine in and outs with follow-up chemistries 8.  Seizure prophylaxis.  Continue Keppra 500 mg twice daily until 6/27.  9.  Hypertension.  Norvasc 5 mg daily, Avapro 300 mg daily, HCTZ 25 mg daily.  Monitor with increased mobility 10.  Hyponatremia.  Sodium chloride tablets 2 g twice  daily.  Follow-up chemistries 11.  History of glaucoma.  Followed by Dr. Satira Sark.  Continue eyedrops as directed. 12.  History of asthma.  Continue inhalers as directed  I have personally performed a face to face diagnostic evaluation, including, but not limited to relevant history and physical exam findings, of this patient and developed relevant assessment and plan.  Additionally, I have reviewed and concur with the physician assistant's documentation above.  Leeroy Cha, MD  [place], PA-C 04/21/2021

## 2021-04-22 DIAGNOSIS — C439 Malignant melanoma of skin, unspecified: Secondary | ICD-10-CM | POA: Diagnosis not present

## 2021-04-22 LAB — COMPREHENSIVE METABOLIC PANEL
ALT: 33 U/L (ref 0–44)
AST: 18 U/L (ref 15–41)
Albumin: 3.3 g/dL — ABNORMAL LOW (ref 3.5–5.0)
Alkaline Phosphatase: 66 U/L (ref 38–126)
Anion gap: 9 (ref 5–15)
BUN: 10 mg/dL (ref 8–23)
CO2: 29 mmol/L (ref 22–32)
Calcium: 9.6 mg/dL (ref 8.9–10.3)
Chloride: 95 mmol/L — ABNORMAL LOW (ref 98–111)
Creatinine, Ser: 0.8 mg/dL (ref 0.61–1.24)
GFR, Estimated: >60 mL/min (ref >60)
Glucose, Bld: 148 mg/dL — ABNORMAL HIGH (ref 70–99)
Potassium: 3.5 mmol/L (ref 3.5–5.1)
Sodium: 133 mmol/L — ABNORMAL LOW (ref 135–145)
Total Bilirubin: 1.1 mg/dL (ref 0.3–1.2)
Total Protein: 5.6 g/dL — ABNORMAL LOW (ref 6.5–8.1)

## 2021-04-22 LAB — CBC WITH DIFFERENTIAL/PLATELET
Abs Immature Granulocytes: 0.03 10*3/uL (ref 0.00–0.07)
Basophils Absolute: 0 10*3/uL (ref 0.0–0.1)
Basophils Relative: 0 %
Eosinophils Absolute: 0 10*3/uL (ref 0.0–0.5)
Eosinophils Relative: 0 %
HCT: 39.9 % (ref 39.0–52.0)
Hemoglobin: 14.4 g/dL (ref 13.0–17.0)
Immature Granulocytes: 1 %
Lymphocytes Relative: 16 %
Lymphs Abs: 0.7 10*3/uL (ref 0.7–4.0)
MCH: 32.1 pg (ref 26.0–34.0)
MCHC: 36.1 g/dL — ABNORMAL HIGH (ref 30.0–36.0)
MCV: 88.9 fL (ref 80.0–100.0)
Monocytes Absolute: 0.3 10*3/uL (ref 0.1–1.0)
Monocytes Relative: 6 %
Neutro Abs: 3.5 10*3/uL (ref 1.7–7.7)
Neutrophils Relative %: 77 %
Platelets: 231 10*3/uL (ref 150–400)
RBC: 4.49 MIL/uL (ref 4.22–5.81)
RDW: 12.2 % (ref 11.5–15.5)
WBC: 4.5 10*3/uL (ref 4.0–10.5)
nRBC: 0 % (ref 0.0–0.2)

## 2021-04-22 MED ORDER — ENSURE ENLIVE PO LIQD
237.0000 mL | Freq: Three times a day (TID) | ORAL | Status: DC
  Administered 2021-04-22 – 2021-05-01 (×20): 237 mL via ORAL

## 2021-04-22 MED ORDER — ADULT MULTIVITAMIN W/MINERALS CH
1.0000 | ORAL_TABLET | Freq: Every day | ORAL | Status: DC
  Administered 2021-04-22 – 2021-05-01 (×10): 1 via ORAL
  Filled 2021-04-22 (×10): qty 1

## 2021-04-22 NOTE — H&P (Signed)
Physical Medicine and Rehabilitation Admission H&P   CC: IVH post-craniotomy for malignant melanoma  HPI: Shannon Horton is a 75 year old right-handed male with history of glaucoma followed by Dr. 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  Per chart review lives with spouse.  Two-level home bed and bath main level one-step to entry.  Independent with assistive device and reported multiple falls over the past 6 weeks.  Prior to 6 weeks ago patient independent and active.  Presented 03/25/2021 with persistent headaches balance deficits x6 weeks, left hemiparesis as well as shuffling gait.  MRI of the brain showed a 3.8 cm enhancing and hemorrhagic mass in the right basal ganglia/frontal lobe region with a 5 mm left midline shift.  CT of the chest and abdomen was notable for left upper extremity pulmonary nodule, left hilar adenopathy right upper lobe pulmonary nodule left lower quadrant mass arising from the right adrenal gland, hepatic lesions.  CT-guided biopsy of the adrenal mass 03/30/2021 positive for malignancy.  Patient underwent right craniotomy resection of tumor 03/31/2021 per Dr. Reatha Armour.  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  Brain surgical tissue pathology remain pending follow-up per oncology services Dr.Gudena as well as Dr. Alen Blew.  Decadron protocol as indicated.  Maintained on Keppra for seizure prophylaxis.  Patient was cleared to begin subcutaneous heparin for DVT prophylaxis 04/02/2021.  Tolerating a regular consistency diet.  Therapy evaluations completed due to patient decreased functional mobility left side weakness was admitted to inpatient rehab services 04/06/2021.  Patient with slow progressive gains while on rehab services.  He did receive followed by neuropsychology for hospital course depression and emotional support provided.  Wife and family did note  some altered mental status changes 04/17/2021 with urinalysis completed negative nitrite, CBC unremarkable and latest chemistry sodium of 130.  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  No definite acute ventriculomegaly or transependymal edema.  Contact made to neurosurgery due to these acute findings patient was discharged to acute care services for ongoing monitoring.  MRI of the brain completed 04/19/2021 showing acute left occipital horn intraventricular hemorrhage similar to recent CT of the head.  Mild increase in ventricular size relative to prior MRI.  Evolving changes of right basal ganglia tumor resection with decreased surrounding edema and mass-effect.  Resolution of midline shift.  Patient with ongoing conservative care and he was maintained on low-dose Decadron therapy.  He was cleared to continue his subcutaneous heparin as directed.  Placed on sodium chloride tablets for hyponatremia with latest sodium level 132.  Mental status improved ammonia level 25.  Patient's therapy is resumed and was readmitted back to inpatient rehab services for comprehensive therapies. Demonstrating dysarthric speech.   Review of Systems  Constitutional:  Positive for malaise/fatigue.  HENT:  Negative for hearing loss.   Eyes:  Positive for blurred vision.  Respiratory:  Negative for cough and shortness of breath.   Cardiovascular:  Negative for chest pain, palpitations and leg swelling.  Gastrointestinal:  Positive for constipation. Negative for heartburn, nausea and vomiting.  Genitourinary:  Negative for dysuria, flank pain and hematuria.  Musculoskeletal:  Positive for falls and myalgias.  Skin:  Negative for rash.  Neurological:  Positive for dizziness and headaches.  All other systems reviewed and are negative. Past Medical History:  Diagnosis Date   Allergic rhinitis    gets shots per  Dr. Velora Heckler   Allergy    sees Dr. Harold Hedge    Asthma    Glaucoma    sees Dr. Satira Sark    Hypertension    Malignant melanoma [facility])    right lower leg, diagnosed on1/21/11   Past Surgical History:  Procedure Laterality Date   APPLICATION OF CRANIAL NAVIGATION N/A 03/31/2021   Procedure: APPLICATION OF CRANIAL NAVIGATION;  Surgeon: Karsten Ro, DO;  Location: [place];  Service: Neurosurgery;  Laterality: N/A;   CATARACT EXTRACTION     right ey, Dr. Satira Sark    COLONOSCOPY  12/15/2015   per Dr.Kaplan, clear, no repeats    CRANIOTOMY N/A 03/31/2021   Procedure: RIGHT FRONTAL CRANIOTOMY FOR TUMOR;  Surgeon: Karsten Ro, DO;  Location: [facility];  Service: Neurosurgery;  Laterality: N/A;   ESOPHAGOGASTRODUODENOSCOPY     WITH ESOPHAGEAL DILATION 2006 PER dR. kAPLAN   REMOVAL  of melanoma  12/08/09   from right calf; per Dr. Monica Becton   removal of melanoma from left abdomen  01/21/10    per Dr. Denice Bors CELL CARCINOMA EXCISION  2013   per Dr. Terrence Dupont, from top of scalp    Family History  Problem Relation Age of Onset   Hypertension Other    Colon polyps Mother    Hypertension Mother    Diabetes Father    Liver disease Maternal Uncle    Cancer Brother        throat and lung (smoker)   Colon cancer Neg Hx    Stomach cancer Neg Hx    Esophageal cancer Neg Hx    Pancreatic cancer Neg Hx    Social History:  reports that he quit smoking about 30 years ago. His smoking use included cigarettes. He has never used smokeless tobacco. He reports current alcohol use of about 5.0 - 10.0 standard drinks of alcohol per week. He reports that he does not use drugs. Allergies:  Allergies  Allergen Reactions   Banana Rash   Medications Prior to Admission  Medication Sig Dispense Refill   albuterol (PROVENTIL HFA;VENTOLIN HFA) 108 (90 Base) MCG/ACT inhaler Inhale 2 puffs into the lungs every 4 (four) hours as needed for wheezing or shortness of breath. 8.5 Inhaler 0   amLODipine  (NORVASC) 5 MG tablet Take 1 tablet (5 mg total) by mouth daily. 90 tablet 3   bimatoprost (LUMIGAN) 0.03 % ophthalmic solution Place 1 drop into both eyes at bedtime.     brinzolamide (AZOPT) 1 % ophthalmic suspension Place 1 drop into both eyes 2 (two) times daily.     dexamethasone (DECADRON) 1 MG tablet Take 2 tablets (2 mg total) by mouth every 12 (twelve) hours. 6 tablet 0   Fluticasone Furoate (ARNUITY ELLIPTA) 200 MCG/ACT AEPB Inhale 1 puff into the lungs daily.     HYDROcodone-acetaminophen (NORCO/VICODIN) 5-325 MG tablet Take 1 tablet by mouth every 4 (four) hours as needed for moderate pain. 30 tablet 0   levETIRAcetam (KEPPRA) 250 MG tablet Take 1 tablet (250 mg total) by mouth 2 (two) times daily. 60 tablet 2   methylphenidate (RITALIN) 5 MG tablet Take 1 tablet (5 mg total) by mouth 2 (two) times daily with breakfast and lunch. 60 tablet 0   pantoprazole (PROTONIX) 40 MG tablet Take 1 tablet (40 mg total) by mouth at bedtime. 30 tablet 0   sodium chloride 1 g tablet Take 2 tablets (  2 g total) by mouth 2 (two) times daily with a meal. 60 tablet 2   telmisartan-hydrochlorothiazide (MICARDIS HCT) 80-25 MG tablet TAKE 1 TABLET BY MOUTH EVERY DAY 90 tablet 3    Drug Regimen Review Drug regimen was reviewed and remains appropriate with no significant issues identified  Home: Home Living Family/patient expects to be discharged to:: Private residence Living Arrangements: Spouse/significant other   Functional History:    Functional Status:  Mobility: Bed Mobility Overal bed mobility: Needs Assistance Bed Mobility: Supine to Sit, Sit to Supine, Rolling Rolling: Min assist Supine to sit: Min assist Sit to supine: Mod assist General bed mobility comments: Pt initiating, bringing BLE's off edge of bed, bringing trunk ~25% upright, with assist for last 75% to obtain midline. Assist for BLE's back into bed Transfers Overall transfer level: Needs assistance Equipment used:  (back of  recliner chair) Transfers: Sit to/from Stand Sit to Stand: Mod assist, From elevated surface, +2 physical assistance General transfer comment: ModA + 2 to rise from edge of bed, pt pulling up with BUE's from back of recliner chair handles. Max multimodal cues for bilateral knee extension and upright posture.      ADL:    Cognition: Cognition Overall Cognitive Status: Impaired/Different from baseline Orientation Level: Oriented to person Cognition Arousal/Alertness: Awake/alert Behavior During Therapy: Flat affect Overall Cognitive Status: Impaired/Different from baseline Area of Impairment: Attention, Safety/judgement, Problem solving, Following commands, Awareness, Orientation Orientation Level: Disoriented to, Place, Time, Situation Current Attention Level: Sustained Memory: Decreased short-term memory Following Commands: Follows one step commands with increased time Safety/Judgement: Decreased awareness of safety, Decreased awareness of deficits Awareness: Intellectual Problem Solving: Decreased initiation, Difficulty sequencing, Requires verbal cues General Comments: Improved command following and initiation today. Asking what city we were in and seemed surprised to hear [place].  Physical Exam: Blood pressure (!) 144/76, pulse (!) 59, temperature 97.8 F (36.6 C), resp. rate 16, height 5\' 8"  (1.727 m), weight 67.1 kg, SpO2 96 %. Gen: no distress, normal appearing HEENT: oral mucosa pink and moist, NCAT Cardio: Reg rate Chest: normal effort, normal rate of breathing Abd: soft, non-distended Ext: no edema Psych: pleasant, normal affect Skin: intact Neuro: Musculoskeletal:  Neurological:     Comments: Patient is alert. He provides his name and age but needed multiple cues for place and situation.  Limited awareness of deficits.  He does follow simple commands- 4/5 strength in upper extremities, moving right leg but will not move left. Dysarthric speech- difficult to  understand. Makes eye contact with me and his sister  Results for orders placed or performed during the hospital encounter of 04/21/21 (from the past 48 hour(s))  CBC     Status: None   Collection Time: 04/21/21  3:59 AM  Result Value Ref Range   WBC 5.5 4.0 - 10.5 K/uL   RBC 4.46 4.22 - 5.81 MIL/uL   Hemoglobin 14.0 13.0 - 17.0 g/dL   HCT 40.7 39.0 - 52.0 %   MCV 91.3 80.0 - 100.0 fL   MCH 31.4 26.0 - 34.0 pg   MCHC 34.4 30.0 - 36.0 g/dL   RDW 12.6 11.5 - 15.5 %   Platelets 221 150 - 400 K/uL   nRBC 0.0 0.0 - 0.2 %    Comment: Performed at [facility], [place] [address], [place], [place] [address]  Comprehensive metabolic panel     Status: Abnormal   Collection Time: 04/22/21  4:33 AM  Result Value Ref Range   Sodium 133 (  L) 135 - 145 mmol/L   Potassium 3.5 3.5 - 5.1 mmol/L   Chloride 95 (L) 98 - 111 mmol/L   CO2 29 22 - 32 mmol/L   Glucose, Bld 148 (H) 70 - 99 mg/dL    Comment: Glucose reference range applies only to samples taken after fasting for at least 8 hours.   BUN 10 8 - 23 mg/dL   Creatinine, Ser 0.80 0.61 - 1.24 mg/dL   Calcium 9.6 8.9 - 10.3 mg/dL   Total Protein 5.6 (L) 6.5 - 8.1 g/dL   Albumin 3.3 (L) 3.5 - 5.0 g/dL   AST 18 15 - 41 U/L   ALT 33 0 - 44 U/L   Alkaline Phosphatase 66 38 - 126 U/L   Total Bilirubin 1.1 0.3 - 1.2 mg/dL   GFR, Estimated >60 >60 mL/min    Comment: (NOTE) Calculated using the CKD-EPI Creatinine Equation (2021)    Anion gap 9 5 - 15    Comment: Performed at [facility], [place] [address] [place], [place] [address]  CBC WITH DIFFERENTIAL     Status: Abnormal   Collection Time: 04/22/21  4:33 AM  Result Value Ref Range   WBC 4.5 4.0 - 10.5 K/uL   RBC 4.49 4.22 - 5.81 MIL/uL   Hemoglobin 14.4 13.0 - 17.0 g/dL   HCT 39.9 39.0 - 52.0 %   MCV 88.9 80.0 - 100.0 fL   MCH 32.1 26.0 - 34.0 pg   MCHC 36.1 (H) 30.0 - 36.0 g/dL   RDW 12.2 11.5 - 15.5 %   Platelets 231 150 - 400 K/uL   nRBC 0.0 0.0 - 0.2 %   Neutrophils  Relative % 77 %   Neutro Abs 3.5 1.7 - 7.7 K/uL   Lymphocytes Relative 16 %   Lymphs Abs 0.7 0.7 - 4.0 K/uL   Monocytes Relative 6 %   Monocytes Absolute 0.3 0.1 - 1.0 K/uL   Eosinophils Relative 0 %   Eosinophils Absolute 0.0 0.0 - 0.5 K/uL   Basophils Relative 0 %   Basophils Absolute 0.0 0.0 - 0.1 K/uL   Immature Granulocytes 1 %   Abs Immature Granulocytes 0.03 0.00 - 0.07 K/uL    Comment: Performed at [place] [address] [place], [place] [address]   No results found.     Medical Problem List and Plan: 1.  Left hemiparesis with visual deficits secondary to metastatic melanoma status postcraniotomy for resection of right frontal tumor 03/31/2021.  Pathology positive for malignant melanoma  -patient may shower  -ELOS/Goals: MinA 10-14 days 2.  Impaired mobility/s/p craniotomy: -DVT/anticoagulation: Continue Subcutaneous heparin 5000 units every 12 hours until cleared by NSGY. Please initiate heparin education to family.   -antiplatelet therapy: N/A 3. Pain Management: Tylenol as needed 4. Impaired attention: Continue Ritalin 5 mg twice daily.  Provide emotional support  -antipsychotic agents: N/A 5. Neuropsych: This patient is not capable of making decisions on his own behalf. 6. Skin/Wound Care: Routine skin checks 7. Fluids/Electrolytes/Nutrition: Routine in and outs with follow-up chemistries 8.  Seizure prophylaxis.  Continue Keppra 500 mg twice daily until 6/27.  9.  Hypertension.  Norvasc 5 mg daily, Avapro 300 mg daily, HCTZ 25 mg daily.  Monitor with increased mobility 10.  Hyponatremia.  Sodium chloride tablets 2 g twice daily.  Follow-up chemistries 11.  History of glaucoma.  Followed by Dr. Satira Sark.  Continue eyedrops as directed. 12.  History of asthma.  Continue inhalers as directed  I have personally performed a face to face diagnostic evaluation, including, but not limited to relevant history and physical exam findings, of this patient and developed  relevant assessment and plan.  Additionally, I have reviewed and concur with the physician assistant's documentation above.  Leeroy Cha, MD  Lavon Paganini Angiulli, PA-C

## 2021-04-22 NOTE — Discharge Instructions (Signed)
Inpatient Rehab Discharge Instructions  Shannon Horton. Discharge date and time: No discharge date for patient encounter.   Activities/Precautions/ Functional Status: Activity: activity as tolerated Diet: regular diet Wound Care: Routine skin checks Functional status:  ___ No restrictions     ___ Walk up steps independently ___ 24/7 supervision/assistance   ___ Walk up steps with assistance ___ Intermittent supervision/assistance  ___ Bathe/dress independently ___ Walk with walker     __x_ Bathe/dress with assistance ___ Walk Independently    ___ Shower independently ___ Walk with assistance    ___ Shower with assistance ___ No alcohol     ___ Return to work/school ________  Special Instructions: No driving smoking or alcohol   My questions have been answered and I understand these instructions. I will adhere to these goals and the provided educational materials after my discharge from the hospital.  Patient/Caregiver Signature _______________________________ Date __________  Clinician Signature _______________________________________ Date __________  Please bring this form and your medication list with you to all your follow-up doctor's appointments.

## 2021-04-22 NOTE — Progress Notes (Signed)
Palliative:  Consult received and chart reviewed. Spoke briefly with wife - meeting scheduled for 6/24 at 3:30 PM.  Juel Burrow, DNP, AGNP-C Palliative Medicine Team Team Phone # [phone]  Pager # [phone]  NO CHARGE

## 2021-04-22 NOTE — Progress Notes (Signed)
Initial Nutrition Assessment  DOCUMENTATION CODES:   Non-severe (moderate) malnutrition in context of chronic illness  INTERVENTION:   -Increase Ensure Enlive po to TID, each supplement provides 350 kcal and 20 grams of protein  -Magic cup TID with meals, each supplement provides 290 kcal and 9 grams of protein  -MVI with minerals daily  NUTRITION DIAGNOSIS:   Moderate Malnutrition related to chronic illness (metastatic melanoma) as evidenced by mild fat depletion, moderate fat depletion, mild muscle depletion, moderate muscle depletion, percent weight loss.  GOAL:   Patient will meet greater than or equal to 90% of their needs  MONITOR:   PO intake, Supplement acceptance, Labs, Weight trends, Skin, I & O's  REASON FOR ASSESSMENT:   Malnutrition Screening Tool    ASSESSMENT:   Shannon Horton is a 75 year old right-handed male with history of glaucoma followed by Dr. Satira Sark hypertension, tobacco use as well as remote melanoma x2 removed from his right calf and left flank 2011 with negative nodal biopsy  Pt admitted with left hemiparesis with visual deficits secondary to metastatic melanoma status postcraniotomy for resection of right frontal tumor 03/31/2021.  Pathology positive for malignant melanoma  Reviewed I/O's: -490 ml x 24 hours  UOP: 850 ml x 24 hours  Spoke with pt at bedside, who was pleasant and in good spirits today. He reports that his appetite has improved and really enjoys the hospital food ("it's so much better than the food at the hospital in [facility]!"). Pt reports that he did not eat all of his breakfast, as he was interrupted by therapy. Noted meal completion 5-100%.   Per pt, he had a good appetite, consuming 3 meals per day, however, unable to provide diet recall.   Pt unsure of UBW, but endorses wt loss over the past several months. He does not know why he has lost weight. Noted pt has experienced a 8.8% wt loss over the past months, which is  significant for time frame.   Discussed importance of good meal and supplement intake to promote healing. Pt has already consumed and Ensure supplement today is amenable to continue them.   Medications reviewed and include decadron and keppra.   Labs reviewed: Na: 133, CBGS: 132 (inpatient orders for glycemic control are none).    NUTRITION - FOCUSED PHYSICAL EXAM:  Flowsheet Row Most Recent Value  Orbital Region Moderate depletion  Upper Arm Region Moderate depletion  Thoracic and Lumbar Region Mild depletion  Buccal Region Mild depletion  Temple Region Moderate depletion  Clavicle Bone Region Moderate depletion  Clavicle and Acromion Bone Region Moderate depletion  Scapular Bone Region Moderate depletion  Dorsal Hand Moderate depletion  Patellar Region Moderate depletion  Anterior Thigh Region Moderate depletion  Posterior Calf Region Moderate depletion  Edema (RD Assessment) None  Hair Reviewed  Eyes Reviewed  Mouth Reviewed  Skin Reviewed  Nails Reviewed       Diet Order:   Diet Order             Diet regular Room service appropriate? Yes; Fluid consistency: Thin  Diet effective now                   EDUCATION NEEDS:   Education needs have been addressed  Skin:  Skin Assessment: Skin Integrity Issues: Skin Integrity Issues:: Incisions Incisions: closed rt head.  Last BM:  04/20/21  Height:   Ht Readings from Last 1 Encounters:  04/21/21 5\' 8"  (1.727 m)    Weight:  Wt Readings from Last 1 Encounters:  04/21/21 67.1 kg    Ideal Body Weight:  70 kg  BMI:  Body mass index is 22.49 kg/m.  Estimated Nutritional Needs:   Kcal:  2100-2300  Protein:  120-135 grams  Fluid:  > 2 L    Loistine Chance, RD, LDN, [place] Registered Dietitian II Certified Diabetes Care and Education Specialist Please refer to [facility] for RD and/or RD on-call/weekend/after hours pager

## 2021-04-22 NOTE — Evaluation (Signed)
Occupational Therapy Assessment and Plan  Patient Details  Name: Shannon Horton. MRN: [id number] Date of Birth: [birth year]  OT Diagnosis: abnormal posture, altered mental status, cognitive deficits, hemiplegia affecting non-dominant side, and muscle weakness (generalized) Rehab Potential:   ELOS: 2-2.5   Today's Date: 04/22/2021 OT Individual Time: 0900-1000 OT Individual Time Calculation (min): 60 min     TIME 1400-1430 30  Hospital Problem: Active Problems:   Malignant melanoma (HCC)   Past Medical History:  Past Medical History:  Diagnosis Date   Allergic rhinitis    gets shots per Dr. Velora Heckler   Allergy    sees Dr. Harold Hedge    Asthma    Glaucoma    sees Dr. Satira Sark    Hypertension    Malignant melanoma [facility])    right lower leg, diagnosed on1/21/11   Past Surgical History:  Past Surgical History:  Procedure Laterality Date   APPLICATION OF CRANIAL NAVIGATION N/A 03/31/2021   Procedure: APPLICATION OF CRANIAL NAVIGATION;  Surgeon: Karsten Ro, DO;  Location: [place];  Service: Neurosurgery;  Laterality: N/A;   CATARACT EXTRACTION     right ey, Dr. Satira Sark    COLONOSCOPY  12/15/2015   per Dr.Kaplan, clear, no repeats    CRANIOTOMY N/A 03/31/2021   Procedure: RIGHT FRONTAL CRANIOTOMY FOR TUMOR;  Surgeon: Karsten Ro, DO;  Location: [place];  Service: Neurosurgery;  Laterality: N/A;   ESOPHAGOGASTRODUODENOSCOPY     WITH ESOPHAGEAL DILATION 2006 PER dR. kAPLAN   REMOVAL  of melanoma  12/08/09   from right calf; per Dr. Monica Becton   removal of melanoma from left abdomen  01/21/10    per Dr. Denice Bors CELL CARCINOMA EXCISION  2013   per Dr. Terrence Dupont, from top of scalp     Assessment & Plan Clinical Impression: 75 yo male presents to [facility] on 5/26 with headaches, balance issues with falls x4-6 weeks. MRI brain shows Large, 3.8 cm enhancing and hemorrhagic mass within the right basal ganglia/frontal lobe with 75m L midline shift, concern for metastasis. CT  chest/abdomen/pelvis shows L upper pulmonary nodul, L hilar adenopathy, RUL pulmonary nodule, upper quadrant mass arising from R adrenal gland, hepatic lesions. s/p CT guided biopsy R adrenal mass on 5/31 with pathology + for malignant neoplasm, oncology following. s/p R frontal craniotomy 6/1. Discharged to CIR and then readmitted to acute after increased confusion and lethargy. CT brain revealed stable postsurgical resection cavity, small L IVH. PMH includes glaucoma, HTN, malignant melanoma 2011.   Patient currently requires max with basic self-care skills secondary to muscle weakness, decreased cardiorespiratoy endurance, impaired timing and sequencing, unbalanced muscle activation, motor apraxia, and decreased coordination, decreased visual perceptual skills, decreased midline orientation, decreased attention to left, and ideational apraxia, decreased initiation, decreased attention, decreased awareness, decreased problem solving, decreased safety awareness, decreased memory, and delayed processing, and decreased sitting balance, decreased standing balance, decreased postural control, hemiplegia, and decreased balance strategies.  Prior to hospitalization, patient could complete BADL with independent .  Patient will benefit from skilled intervention to decrease level of assist with basic self-care skills and increase independence with basic self-care skills prior to discharge home with care partner.  Anticipate patient will require 24 hour supervision and minimal physical assistance and follow up home health.  OT - End of Session Activity Tolerance: Tolerates 30+ min activity with multiple rests Endurance Deficit: Yes OT Assessment OT Barriers to Discharge: Incontinence;Neurogenic Bowel & Bladder;Lack of/limited family support OT Patient demonstrates impairments in  the following area(s): Balance;Cognition;Endurance;Motor;Nutrition;Perception;Safety;Vision;Sensory OT Basic ADL's Functional  Problem(s): Grooming;Bathing;Dressing;Toileting OT Transfers Functional Problem(s): Toilet;Tub/Shower OT Plan OT Intensity: Minimum of 1-2 x/day, 45 to 90 minutes OT Frequency: 5 out of 7 days OT Duration/Estimated Length of Stay: 2-2.5 OT Treatment/Interventions: Balance/vestibular training;Discharge planning;Functional electrical stimulation;Pain management;Self Care/advanced ADL retraining;Therapeutic Activities;UE/LE Coordination activities;Cognitive remediation/compensation;Disease mangement/prevention;Functional mobility training;Patient/family education;Skin care/wound managment;Therapeutic Exercise;Visual/perceptual remediation/compensation;UE/LE Strength taining/ROM;Splinting/orthotics;Psychosocial support;Neuromuscular re-education;DME/adaptive equipment instruction;Community reintegration OT Self Feeding Anticipated Outcome(s): S OT Basic Self-Care Anticipated Outcome(s): MIN OT Toileting Anticipated Outcome(s): MIN OT Bathroom Transfers Anticipated Outcome(s): MIN OT Recommendation Patient destination: Home Follow Up Recommendations: Home health OT Equipment Recommended: 3 in 1 bedside comode;Tub/shower seat   OT Evaluation Precautions/Restrictions  Precautions Precautions: Fall Precaution Comments: R crani Restrictions Weight Bearing Restrictions: No General Chart Reviewed: Yes Family/Caregiver Present: No Vital Signs  Pain Pain Assessment Pain Scale: 0-10 Pain Score: 0-No pain Home Living/Prior Functioning Home Living Family/patient expects to be discharged to:: Private residence Living Arrangements: Spouse/significant other Available Help at Discharge: Family, Available 24 hours/day Type of Home: House Home Access: Stairs to enter Technical brewer of Steps: 1 Entrance Stairs-Rails: None Home Layout: One level, Other (Comment) Bathroom Shower/Tub: Walk-in shower, Tub/shower unit (walk in shower has door, tub has curtain) Engineer, mining: Yes  Lives With: Spouse IADL History 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 Prior to 6 weeks ago, pt biking 20+ miles on bike. (all information taken from chart from previous evaluation) Vision Baseline Vision/History: Wears glasses Wears Glasses: At all times Patient Visual Report: No change from baseline Vision Assessment?: Vision impaired- to be further tested in functional context Perception  Inattention/Neglect: Does not attend to left side of body Comments: mild L inattention Praxis Praxis: Impaired Praxis Impairment Details: Motor planning;Perseveration Cognition Overall Cognitive Status: Impaired/Different from baseline Arousal/Alertness: Awake/alert Orientation Level: Person;Place;Situation Person: Oriented Place: Disoriented Situation: Oriented Year: 2022 Month: June Day of Week: Correct Memory: Impaired Memory Impairment: Storage deficit;Retrieval deficit;Decreased short term memory;Decreased recall of new information Decreased Short Term Memory: Verbal basic;Functional basic Immediate Memory Recall: Sock;Blue;Bed Memory Recall Sock: Not able to recall Memory Recall Blue: Not able to recall Memory Recall Bed: Not able to recall Attention: Sustained Sustained Attention: Impaired Sustained Attention Impairment: Verbal basic;Functional basic Awareness: Impaired Awareness Impairment: Intellectual impairment Problem Solving: Impaired Problem Solving Impairment: Verbal basic;Functional basic Executive Function: Sequencing;Self Monitoring;Self Correcting Sequencing: Impaired Sequencing Impairment: Verbal basic;Functional  basic Self Monitoring: Impaired Self Monitoring Impairment: Verbal basic;Functional basic Self Correcting: Impaired Self Correcting Impairment: Verbal basic;Functional basic Safety/Judgment: Impaired Sensation Sensation Light Touch: Appears Intact Proprioception: Impaired Detail Proprioception Impaired Details: Impaired LLE Coordination Gross Motor Movements are Fluid and Coordinated: No Fine Motor Movements are Fluid and Coordinated: No Coordination and Movement Description: decreased coordination with all functional mobility Motor  Motor Motor: Hemiplegia Motor - Skilled Clinical Observations: mild L hemi  Trunk/Postural Assessment  Cervical Assessment Cervical Assessment: Exceptions to WFL (hea d forward) Thoracic Assessment Thoracic Assessment: Exceptions to [facility] (significant kyphosis with forward flexion in sitting) Lumbar Assessment Lumbar Assessment: Exceptions to [facility] (posterior pelvic tilt) Postural Control Postural Control: Deficits on evaluation (delayed L >R)  Balance Dynamic Sitting Balance Dynamic Sitting - Balance Support: Right upper extremity supported;Left upper extremity supported;During functional activity Dynamic Sitting -  Level of Assistance: 3: Mod assist Sitting balance - Comments: Posterior lean, improving to min A-CGA with anterior support Dynamic Standing Balance Dynamic Standing - Balance Support: During functional activity;Bilateral upper extremity supported Dynamic Standing - Level of Assistance: 2: Max assist Extremity/Trunk Assessment      Care Tool Care Tool Self Care Eating        Oral Care    Oral Care Assist Level: Contact Guard/Toucning assist    Bathing   Body parts bathed by patient: Right arm;Left arm;Chest;Abdomen;Right upper leg;Left upper leg;Face Body parts bathed by helper: Right lower leg;Left lower leg Body parts n/a: Buttocks;Front perineal area Assist Level: Maximal Assistance - Patient 24 - 49% (for sitting balance  seated at sink)    Upper Body Dressing(including orthotics)   What is the patient wearing?: Pull over shirt   Assist Level: Minimal Assistance - Patient > 75%    Lower Body Dressing (excluding footwear)   What is the patient wearing?: Pants;Underwear/pull up Assist for lower body dressing: Maximal Assistance - Patient 25 - 49%    Putting on/Taking off footwear   What is the patient wearing?: Non-skid slipper socks Assist for footwear: Moderate Assistance - Patient 50 - 74%       Care Tool Toileting Toileting activity         Care Tool Bed Mobility Roll left and right activity   Roll left and right assist level: Moderate Assistance - Patient 50 - 74%    Sit to lying activity   Sit to lying assist level: Minimal Assistance - Patient > 75%    Lying to sitting edge of bed activity   Lying to sitting edge of bed assist level: Moderate Assistance - Patient 50 - 74%     Care Tool Transfers Sit to stand transfer   Sit to stand assist level: Maximal Assistance - Patient 25 - 49%    Chair/bed transfer   Chair/bed transfer assist level: Maximal Assistance - Patient 25 - 49%     Toilet transfer   Assist Level: Maximal Assistance - Patient 24 - 49%     Care Tool Cognition Expression of Ideas and Wants Expression of Ideas and Wants: Frequent difficulty - frequently exhibits difficulty with expressing needs and ideas   Understanding Verbal and Non-Verbal Content Understanding Verbal and Non-Verbal Content: Sometimes understands - understands only basic conversations or simple, direct phrases. Frequently requires cues to understand   Memory/Recall Ability *first 3 days only Memory/Recall Ability *first 3 days only: Current season    Refer to Care Plan for Long Term Goals  SHORT TERM GOAL WEEK 1 OT Short Term Goal 1 (Week 1): pt will don shirt in supported sitting with S OT Short Term Goal 2 (Week 1): Pt will thread BLE into pants wiht no more than MIN A for sitting balance  during supported sitting OT Short Term Goal 3 (Week 1): Pt will transfer to toilet wiht MOD A OT Short Term Goal 4 (Week 1): Pt will terminate oral care with no more than 2 VC  Recommendations for other services: Neuropsych   Skilled Therapeutic Intervention Session 1: Focus of session on initial evaluation/ADL performance. Pt familiar with this clinician and completes BADL at sit to stand level at sink as written below in ADL section. Of note since last admission midline orientation decreased as well as poor tolerance to upright/transfers. Pt requires lateral supports and minimal tiltling back to maintain position at ADL. Pt also demo significantly impaired termiation skill impacting sequencing  during ADL tasks. Exited session with pt seated in bed, exit alarm on and call light in reach   Session 2: Pt received in bed agreeable to OT. Pt and wife present. Focus of session on EOB dressing, sitting balance, midline orientation, cervical extension and grooming. Pt completes donning shirt EOB eith up to MAX A for sitting balance. Pt rests on R elbow to improve midline orientation. Pt able to sit statically with CGA fading to S for 2 min, however when provided with comb increased to min A with tacltile cues. Pt elects to return to elbow for adjustment. SPT to TIS with MAX A overall and L lean. Exited session with pt seated in TIS, exit alarm on and call light in reach   ADL ADL Eating: Set up Grooming: Minimal assistance Where Assessed-Grooming: Sitting at sink Upper Body Bathing: Minimal assistance Where Assessed-Upper Body Bathing: Wheelchair;Sitting at sink Lower Body Bathing: Moderate assistance Where Assessed-Lower Body Bathing: Wheelchair;Sitting at sink Upper Body Dressing: Minimal assistance Where Assessed-Upper Body Dressing: Sitting at sink;Wheelchair Lower Body Dressing: Moderate assistance Where Assessed-Lower Body Dressing: Sitting at sink;Standing at sink Mobility  Bed  Mobility Bed Mobility: Supine to Sit;Rolling Right;Rolling Left;Sit to Supine Rolling Right: Moderate Assistance - Patient 50-74% Rolling Left: Moderate Assistance - Patient 50-74% Supine to Sit: Moderate Assistance - Patient 50-74% Sit to Supine: Minimal Assistance - Patient > 75% Transfers Sit to Stand: Minimal Assistance - Patient > 75% (Stedy) Stand to Sit: Minimal Assistance - Patient > 75% Charlaine Dalton)   Discharge Criteria: Patient will be discharged from OT if patient refuses treatment 3 consecutive times without medical reason, if treatment goals not met, if there is a change in medical status, if patient makes no progress towards goals or if patient is discharged from hospital.  The above assessment, treatment plan, treatment alternatives and goals were discussed and mutually agreed upon: by patient  Tonny Branch 04/22/2021, 1:00 PM

## 2021-04-22 NOTE — Evaluation (Signed)
Speech Language Pathology Assessment and Plan  Patient Details  Name: Shannon Horton. MRN: [id number] Date of Birth: [birth year]  SLP Diagnosis: Cognitive Impairments  Rehab Potential: Good ELOS: 2-2.5    Today's Date: 04/22/2021 SLP Individual Time: 1100-1200 SLP Individual Time Calculation (min): 60 min   Hospital Problem: Active Problems:   Malignant melanoma (HCC)  Past Medical History:  Past Medical History:  Diagnosis Date   Allergic rhinitis    gets shots per Dr. Velora Heckler   Allergy    sees Dr. Harold Hedge    Asthma    Glaucoma    sees Dr. Satira Sark    Hypertension    Malignant melanoma [facility])    right lower leg, diagnosed on1/21/11   Past Surgical History:  Past Surgical History:  Procedure Laterality Date   APPLICATION OF CRANIAL NAVIGATION N/A 03/31/2021   Procedure: APPLICATION OF CRANIAL NAVIGATION;  Surgeon: Karsten Ro, DO;  Location: [place];  Service: Neurosurgery;  Laterality: N/A;   CATARACT EXTRACTION     right ey, Dr. Satira Sark    COLONOSCOPY  12/15/2015   per Dr.Kaplan, clear, no repeats    CRANIOTOMY N/A 03/31/2021   Procedure: RIGHT FRONTAL CRANIOTOMY FOR TUMOR;  Surgeon: Karsten Ro, DO;  Location: [place];  Service: Neurosurgery;  Laterality: N/A;   ESOPHAGOGASTRODUODENOSCOPY     WITH ESOPHAGEAL DILATION 2006 PER dR. kAPLAN   REMOVAL  of melanoma  12/08/09   from right calf; per Dr. Monica Becton   removal of melanoma from left abdomen  01/21/10    per Dr. Denice Bors CELL CARCINOMA EXCISION  2013   per Dr. Terrence Dupont, from top of scalp     Assessment / Plan / Recommendation Clinical Impression 75 yo male presents to [facility] on 5/26 with headaches, balance issues with falls x4-6 weeks. MRI brain shows Large, 3.8 cm enhancing and hemorrhagic mass within the right basal ganglia/frontal lobe with 93m L midline shift, concern for metastasis. CT chest/abdomen/pelvis shows L upper pulmonary nodul, L hilar adenopathy, RUL pulmonary nodule, upper quadrant  mass arising from R adrenal gland, hepatic lesions. s/p CT guided biopsy R adrenal mass on 5/31 with pathology + for malignant neoplasm, oncology following. s/p R frontal craniotomy 6/1. Discharged to CIR and then readmitted to acute after increased confusion and lethargy. CT brain revealed stable postsurgical resection cavity, small L IVH. PMH includes glaucoma, HTN, malignant melanoma 2011.   Patient presents with moderate-to-severe cognitive impairments as evidenced by score of 20/30 on the SLUMS (score of 27+/30 considered normal) and further informal cognitive assessment. Impairments can be characterized by difficulty with sustained attention, working memory, short-term memory, orientation, problem solving, and awareness. Although patient performed relatively well during the structured assessment, breakdown occurred during casual interactions and conversation bringing more attention to deficits. For example, patient was inconsistently oriented during session. He responded to most orientation questions accurately (month, year, location) however exhibited inconsistencies with location and situation, as [place] and felt confident he saw multiple staff members there. He was not easily re-directable during these instances and told SLP they were incorrect. Also exhibited confusion about conversations that did not take place with SLP and perseverated on calling his spouse on the phone. Expressive/receptive language appears intact. Patient benefits from repetition of verbal commands and questions, and additional processing time which appears attributed to decreased attention and working memory.   Oropharyngeal swallow appears  WFL. Patient tolerated regular solids and thin liquids with no overt s/sx of aspiration. Mastication appeared functional. Very mild oral residue noted post swallows, although patient independently cleared with  liquid rinse. Recommend continuation of current diet and thin liquids as tolerated. Set-up assist needed due to difficulty opening packages. Dysphagia intervention does not appear clinically indicated at this time.   Patient would benefit from skilled ST services to address noted deficits, and to maximize cognitive functions and independence. Anticipate patient will require 24/7 supervision at discharge. Spouse was called by phone per patient's request and notified of results of evaluation.     Skilled Therapeutic Interventions          Administered [place] Mental Status Exam (SLUMS), performed BSE, provided education on results of evaluation, spoke with spouse on phone with update on results.    SLP Assessment  Patient will need skilled Speech Lanaguage Pathology Services during CIR admission    Recommendations  SLP Diet Recommendations: Thin;Age appropriate regular solids Medication Administration: Whole meds with liquid Supervision: Patient able to self feed Compensations: Minimize environmental distractions;Slow rate;Small sips/bites Postural Changes and/or Swallow Maneuvers: Seated upright 90 degrees Oral Care Recommendations: Oral care BID Patient destination: Home Follow up Recommendations: Home Health SLP;24 hour supervision/assistance Equipment Recommended: None recommended by SLP    SLP Frequency 3 to 5 out of 7 days   SLP Duration  SLP Intensity  SLP Treatment/Interventions 2-2.5  Minumum of 1-2 x/day, 30 to 90 minutes  Cueing hierarchy;Functional tasks;Patient/family education;Internal/external aids;Environmental controls;Therapeutic Activities    Pain Pain Assessment Pain Scale: 0-10 Pain Score: 0-No pain Faces Pain Scale: No hurt  Prior Functioning Cognitive/Linguistic Baseline: Within functional limits Type of Home: House  Lives With: Spouse Available Help at Discharge: Family;Available 24 hours/day  SLP Evaluation Cognition Overall Cognitive  Status: Impaired/Different from baseline Arousal/Alertness: Awake/alert Orientation Level: Oriented to person;Disoriented to person;Disoriented to place;Disoriented to time;Disoriented to situation Attention: Sustained Sustained Attention: Impaired Sustained Attention Impairment: Verbal basic;Functional basic Memory: Impaired Memory Impairment: Storage deficit;Retrieval deficit;Decreased short term memory;Decreased recall of new information Decreased Short Term Memory: Verbal basic;Functional basic Awareness: Impaired Awareness Impairment: Intellectual impairment Problem Solving: Impaired Problem Solving Impairment: Verbal basic;Functional basic Sequencing Impairment: Verbal basic;Functional basic Self Monitoring: Impaired Self Monitoring Impairment: Verbal basic;Functional basic Self Correcting: Impaired Self Correcting Impairment: Verbal basic;Functional basic Safety/Judgment: Impaired  Comprehension Auditory Comprehension Overall Auditory Comprehension: Impaired Interfering Components: Working memory;Processing speed EffectiveTechniques: Extra processing time;Repetition;Increased volume Expression Expression Primary Mode of Expression: Verbal Verbal Expression Overall Verbal Expression: Appears within functional limits for tasks assessed Oral Motor Motor Speech Overall Motor Speech: Appears within functional limits for tasks assessed  Care Tool Care Tool Cognition Expression of Ideas and Wants Expression of Ideas and Wants: Frequent difficulty - frequently exhibits difficulty with expressing needs and ideas   Understanding Verbal and Non-Verbal Content Understanding Verbal and Non-Verbal Content: Sometimes understands - understands only basic conversations or simple, direct phrases. Frequently requires cues to understand   Memory/Recall Ability *first 3 days only Memory/Recall Ability *first 3 days only: Current season    Bedside Swallowing Assessment General Date of  Onset: 03/31/21 Previous Swallow Assessment: 04/09/2021 Diet Prior to this Study: Regular;Thin liquids Oral Cavity - Dentition: Adequate natural dentition Self-Feeding Abilities: Needs set up Patient Positioning: Upright in bed Baseline Vocal Quality: Normal  Oral Care Assessment Does patient have any of the following "high(er) risk" factors?: None of the above Does patient have any of the following "at risk" factors?: None of the above Ice  Chips Ice chips: Not tested Thin Liquid Thin Liquid: Within functional limits Nectar Thick Nectar Thick Liquid: Not tested Honey Thick Honey Thick Liquid: Not tested Puree Puree: Not tested Solid Solid: Within functional limits Presentation: Self Fed Oral Phase Functional Implications: Oral residue Other Comments: cleared oral residue with independent liquid rinse BSE Assessment Risk for Aspiration Impact on safety and function: Mild aspiration risk Other Related Risk Factors: Cognitive impairment  Short Term Goals: Week 1: SLP Short Term Goal 1 (Week 1): Patient will consistently orient to place, time, and situation with Mod A verbal cues SLP Short Term Goal 2 (Week 1): Patient will utilize external memory aids to recall new, daily information with Min A verbal cues SLP Short Term Goal 3 (Week 1): Patient will attend to basic functional tasks for 10 minute intervals with Mod A verbal cues for re-direction SLP Short Term Goal 4 (Week 1): Patient will complete basic tasks with Mod A verbal cues for funcitonal problem solving  Refer to Care Plan for Long Term Goals  Recommendations for other services: None   Discharge Criteria: Patient will be discharged from SLP if patient refuses treatment 3 consecutive times without medical reason, if treatment goals not met, if there is a change in medical status, if patient makes no progress towards goals or if patient is discharged from hospital.  The above assessment, treatment plan, treatment  alternatives and goals were discussed and mutually agreed upon: by patient and by family  Patty Sermons 04/22/2021, 4:59 PM

## 2021-04-22 NOTE — Evaluation (Signed)
Physical Therapy Assessment and Plan  Patient Details  Name: Shannon Horton. MRN: [id number] Date of Birth: 31-Jul-1946  PT Diagnosis: Abnormal posture, Abnormality of gait, Cognitive deficits, Coordination disorder, Difficulty walking, Edema, Hemiplegia non-dominant, and Muscle weakness Rehab Potential: Fair ELOS: 2-2.5 weeks   Today's Date: 04/22/2021 PT Individual Time: 0800-0900 PT Individual Time Calculation (min): 60 min    Hospital Problem: Active Problems:   Malignant melanoma (HCC)   Past Medical History:  Past Medical History:  Diagnosis Date   Allergic rhinitis    gets shots per Dr. Velora Heckler   Allergy    sees Dr. Harold Hedge    Asthma    Glaucoma    sees Dr. Satira Sark    Hypertension    Malignant melanoma [facility])    right lower leg, diagnosed on1/21/11   Past Surgical History:  Past Surgical History:  Procedure Laterality Date   APPLICATION OF CRANIAL NAVIGATION N/A 03/31/2021   Procedure: APPLICATION OF CRANIAL NAVIGATION;  Surgeon: Karsten Ro, DO;  Location: [place];  Service: Neurosurgery;  Laterality: N/A;   CATARACT EXTRACTION     right ey, Dr. Satira Sark    COLONOSCOPY  12/15/2015   per Dr.Kaplan, clear, no repeats    CRANIOTOMY N/A 03/31/2021   Procedure: RIGHT FRONTAL CRANIOTOMY FOR TUMOR;  Surgeon: Karsten Ro, DO;  Location: [place];  Service: Neurosurgery;  Laterality: N/A;   ESOPHAGOGASTRODUODENOSCOPY     WITH ESOPHAGEAL DILATION 2006 PER dR. kAPLAN   REMOVAL  of melanoma  12/08/09   from right calf; per Dr. Monica Becton   removal of melanoma from left abdomen  01/21/10    per Dr. Denice Bors CELL CARCINOMA EXCISION  2013   per Dr. Terrence Dupont, from top of scalp     Assessment & Plan Clinical Impression: Patient is a 75 y.o. year old right-handed male with history of glaucoma followed by Dr. 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  Per chart review  lives with spouse.  Two-level home bed and bath main level one-step to entry.  Independent with assistive device and reported multiple falls over the past 6 weeks.  Prior to 6 weeks ago patient independent and active.  Presented 03/25/2021 with persistent headaches balance deficits x6 weeks, left hemiparesis as well as shuffling gait.  MRI of the brain showed a 3.8 cm enhancing and hemorrhagic mass in the right basal ganglia/frontal lobe region with a 5 mm left midline shift.  CT of the chest and abdomen was notable for left upper extremity pulmonary nodule, left hilar adenopathy right upper lobe pulmonary nodule left lower quadrant mass arising from the right adrenal gland, hepatic lesions.  CT-guided biopsy of the adrenal mass 03/30/2021 positive for malignancy.  Patient underwent right craniotomy resection of tumor 03/31/2021 per Dr. Reatha Armour.  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  Brain surgical tissue pathology remain pending follow-up per oncology services Dr.Gudena as well as Dr. Alen Blew.  Decadron protocol as indicated.  Maintained on Keppra for seizure prophylaxis.  Patient was cleared to begin subcutaneous heparin for DVT prophylaxis 04/02/2021.  Tolerating a regular consistency diet.  Therapy evaluations completed due to patient decreased functional mobility left side weakness was admitted to inpatient rehab services 04/06/2021.  Patient with slow progressive gains while on rehab services.  He did receive followed by neuropsychology for hospital course depression and emotional support provided.  Wife and  family did note some altered mental status changes 04/17/2021 with urinalysis completed negative nitrite, CBC unremarkable and latest chemistry sodium of 130.  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  No definite acute ventriculomegaly or transependymal edema.  Contact made  to neurosurgery due to these acute findings patient was discharged to acute care services for ongoing monitoring.  MRI of the brain completed 04/19/2021 showing acute left occipital horn intraventricular hemorrhage similar to recent CT of the head.  Mild increase in ventricular size relative to prior MRI.  Evolving changes of right basal ganglia tumor resection with decreased surrounding edema and mass-effect.  Resolution of midline shift.  Patient with ongoing conservative care and he was maintained on low-dose Decadron therapy.  He was cleared to continue his subcutaneous heparin as directed.  Placed on sodium chloride tablets for hyponatremia with latest sodium level 132.  Mental status improved ammonia level 25.  Patient's therapy is resumed and was readmitted back to inpatient rehab services for comprehensive therapies. Demonstrating dysarthric speech.  Patient transferred to CIR on 04/21/2021 .   Patient currently requires max with mobility secondary to muscle weakness and muscle joint tightness, decreased cardiorespiratoy endurance, impaired timing and sequencing, decreased coordination, and decreased motor planning, decreased midline orientation and decreased attention to left, decreased initiation, decreased attention, decreased awareness, decreased problem solving, decreased safety awareness, decreased memory, and delayed processing, and decreased sitting balance, decreased standing balance, decreased postural control, hemiplegia, decreased balance strategies, and difficulty maintaining precautions.  Prior to hospitalization, patient was independent  with mobility and lived with Spouse in a House home.  Home access is 1Stairs to enter.  Patient will benefit from skilled PT intervention to maximize safe functional mobility, minimize fall risk, and decrease caregiver burden for planned discharge home with 24 hour assist.  Anticipate patient will benefit from follow up [facility] at discharge.  PT - End of  Session Activity Tolerance: Tolerates 30+ min activity with multiple rests Endurance Deficit: Yes PT Assessment Rehab Potential (ACUTE/IP ONLY): Fair PT Barriers to Discharge: Lack of/limited family support;Behavior;Incontinence PT Patient demonstrates impairments in the following area(s): Balance;Behavior;Edema;Endurance;Motor;Nutrition;Pain;Perception;Safety;Sensory;Skin Integrity PT Transfers Functional Problem(s): Bed Mobility;Bed to Chair;Car;Furniture PT Locomotion Functional Problem(s): Ambulation;Wheelchair Mobility;Stairs PT Plan PT Intensity: Minimum of 1-2 x/day ,45 to 90 minutes PT Frequency: Total of 15 hours over 7 days of combined therapies PT Duration Estimated Length of Stay: 2-2.5 weeks PT Treatment/Interventions: Ambulation/gait training;Cognitive remediation/compensation;DME/adaptive equipment instruction;Discharge planning;Functional mobility training;Pain management;Psychosocial support;Splinting/orthotics;Therapeutic Activities;UE/LE Strength taining/ROM;Visual/perceptual remediation/compensation;Wheelchair propulsion/positioning;UE/LE Coordination activities;Therapeutic Exercise;Stair training;Skin care/wound management;Patient/family education;Neuromuscular re-education;Functional electrical stimulation;Disease management/prevention;Community reintegration;Balance/vestibular training PT Transfers Anticipated Outcome(s): min A using LRAD PT Locomotion Anticipated Outcome(s): min A 25 ft using LRAD PT Recommendation Recommendations for Other Services: Neuropsych consult Therapeutic Recreation Interventions: Stress management Follow Up Recommendations: Home health PT Patient destination: Home Equipment Recommended: To be determined   PT Evaluation Precautions/Restrictions Precautions Precautions: Fall Precaution Comments: R crani Restrictions Weight Bearing Restrictions: No Home Living/Prior Functioning Home Living Available Help at Discharge: Family;Available  24 hours/day Type of Home: House Home Access: Stairs to enter [employer] of Steps: 1 Entrance Stairs-Rails: None Home Layout: One level;Other (Comment) Bathroom Shower/Tub: Walk-in shower;Tub/shower unit (walk in shower has door, tub has curtain) Bathroom Toilet: Standard Bathroom Accessibility: Yes  Lives With: Spouse Prior Function Level of Independence: Independent with basic ADLs;Independent with homemaking with ambulation Comments: pt reports using cane progressing to RW for ambulation intermittently PTA,  recent history of multiple falls over the past 6 weeks. Prior to 6 weeks ago, pt biking 20+ miles on bike. (all information taken from chart from previous evaluation) Vision/Perception  Perception Inattention/Neglect: Does not attend to left side of body Comments: mild L inattention Praxis Praxis: Impaired Praxis Impairment Details: Motor planning;Perseveration  Cognition Overall Cognitive Status: Impaired/Different from baseline Arousal/Alertness: Awake/alert Orientation Level: Oriented to person;Disoriented to place;Oriented to situation;Oriented to time Attention: Sustained Sustained Attention: Impaired Sustained Attention Impairment: Verbal basic;Functional basic Memory: Impaired Memory Impairment: Storage deficit;Retrieval deficit;Decreased short term memory;Decreased recall of new information Decreased Short Term Memory: Verbal basic;Functional basic Immediate Memory Recall: Sock;Blue;Bed Memory Recall Sock: Not able to recall Memory Recall Blue: Not able to recall Memory Recall Bed: Not able to recall Awareness: Impaired Awareness Impairment: Intellectual impairment Problem Solving: Impaired Problem Solving Impairment: Verbal basic;Functional basic Executive Function: Sequencing;Self Monitoring;Self Correcting Sequencing: Impaired Sequencing Impairment: Verbal basic;Functional basic Self Monitoring: Impaired Self Monitoring Impairment: Verbal  basic;Functional basic Self Correcting: Impaired Self Correcting Impairment: Verbal basic;Functional basic Safety/Judgment: Impaired Sensation Sensation Light Touch: Appears Intact Proprioception: Impaired Detail Proprioception Impaired Details: Impaired LLE Coordination Gross Motor Movements are Fluid and Coordinated: No Fine Motor Movements are Fluid and Coordinated: No Coordination and Movement Description: decreased coordination with all functional mobility Motor  Motor Motor: Hemiplegia Motor - Skilled Clinical Observations: mild L hemi   Trunk/Postural Assessment  Cervical Assessment Cervical Assessment: Exceptions to WFL (hea d forward) Thoracic Assessment Thoracic Assessment: Exceptions to [facility] (significant kyphosis with forward flexion in sitting) Lumbar Assessment Lumbar Assessment: Exceptions to [facility] (posterior pelvic tilt) Postural Control Postural Control: Deficits on evaluation (delayed L >R)  Balance Dynamic Sitting Balance Dynamic Sitting - Balance Support: Right upper extremity supported;Left upper extremity supported;During functional activity Dynamic Sitting - Level of Assistance: 3: Mod assist Sitting balance - Comments: Posterior lean, improving to min A-CGA with anterior support Dynamic Standing Balance Dynamic Standing - Balance Support: During functional activity;Bilateral upper extremity supported Dynamic Standing - Level of Assistance: 2: Max assist Extremity Assessment  RLE Assessment RLE Assessment: Exceptions to [facility] Active Range of Motion (AROM) Comments: tight hamstrings and heel cords General Strength Comments: Grossly at least 3-/5 with functional mobility LLE Assessment LLE Assessment: Exceptions to [facility] Active Range of Motion (AROM) Comments: tight hamstrings and heel cords General Strength Comments: Grossly at least 2+/5 throughout with functional mobility  Care Tool Care Tool Bed Mobility Roll left and right activity   Roll left and  right assist level: Moderate Assistance - Patient 50 - 74%    Sit to lying activity   Sit to lying assist level: Minimal Assistance - Patient > 75%    Lying to sitting edge of bed activity   Lying to sitting edge of bed assist level: Moderate Assistance - Patient 50 - 74%     Care Tool Transfers Sit to stand transfer   Sit to stand assist level: Maximal Assistance - Patient 25 - 49%    Chair/bed transfer   Chair/bed transfer assist level: Maximal Assistance - Patient 25 - 49%     Toilet transfer   Assist Level: Maximal Assistance - Patient 24 - 49%    Car transfer   Car transfer assist level: Maximal Assistance - Patient 25 - 49%      Care Tool Locomotion Ambulation Ambulation activity did not occur: Safety/medical concerns (patient unable to stand >5 sec with max A before LOB and total A to sit)        Walk 10  feet activity Walk 10 feet activity did not occur: Safety/medical concerns       Walk 50 feet with 2 turns activity Walk 50 feet with 2 turns activity did not occur: Safety/medical concerns      Walk 150 feet activity Walk 150 feet activity did not occur: Safety/medical concerns      Walk 10 feet on uneven surfaces activity Walk 10 feet on uneven surfaces activity did not occur: Safety/medical concerns      Stairs Stair activity did not occur: Safety/medical concerns        Walk up/down 1 step activity Walk up/down 1 step or curb (drop down) activity did not occur: Safety/medical concerns     Walk up/down 4 steps activity did not occuR: Safety/medical concerns  Walk up/down 4 steps activity      Walk up/down 12 steps activity Walk up/down 12 steps activity did not occur: Safety/medical concerns      Pick up small objects from floor Pick up small object from the floor (from standing position) activity did not occur: Safety/medical concerns      Wheelchair     Wheelchair activity did not occur: Safety/medical concerns (patient with significant forward  lean and needed PT to block him in sitting for safety, used TIS w/c)      Wheel 50 feet with 2 turns activity Wheelchair 50 feet with 2 turns activity did not occur: Safety/medical concerns    Wheel 150 feet activity Wheelchair 150 feet activity did not occur: Safety/medical concerns      Refer to Care Plan for Long Term Goals  SHORT TERM GOAL WEEK 1 PT Short Term Goal 1 (Week 1): Patient will perform basic transfers with mod A consistently. PT Short Term Goal 2 (Week 1): Patient will perform bed mobility with min A. PT Short Term Goal 3 (Week 1): Patient will perform sitting balance with mod A consistently. PT Short Term Goal 4 (Week 1): Patient will initiate gait training.  Recommendations for other services: Neuropsych  Skilled Therapeutic Intervention Evaluation completed (see details above and below) with education on PT POC and goals and individual treatment initiated with focus on functional mobility/transfers, LE strength, dynamic standing balance/coordination, ambulation, stair navigation, simulated car transfers, and improved endurance with activity Patient provided with 16"x16" manual wheelchair then changed to 18"x18" TIS w/c  with Roho cushion and adjustments made to head rest and back rest to promote optimal seating posture and pressure distribution. Patient also provided with [facility] for use in room for transfers with nursing staff for patient.  Patient attempting to exit bed on his own when NT and PT arrived, reports that he needed to go to the bathroom. Patient alert and agreeable to PT session. Patient denied pain during session. Patient reported that he thought that he was in a facility at [facility], reoriented patient to place and patient was able to state correct situation. Patient stated that he remembered this therapist from his recent admission to CIR, but did not recognize any of the gyms and reported that he had never been here before.   Therapeutic Activity: Bed  Mobility: Patient performed rolling R/L with mod A and mod cues for initiation, and supine to/from sit with mod-min A with significant posterior lean when coming to sitting EOB. Able to progress from max A to CGA in sitting balance with facilitation for forward weight shift and mod cues for placing hands on knees.  Transfers: Patient performed squat pivot bed<>w/c and w/c<>drop down BSC over the toilet  with use of grab bar and arm rests with max A due to motor planning deficits and significant forward flexed posture. He performed sit to/from stand x1 with max A a significant flexed posture <5 seconds before patient initiated sitting due to posterior LOB. Patient performed sit to/from stand and dependent transfer bed>w/c with min A in the [place], recommend use of [place] for transfers with nursing, safety plan updated. Provided verbal cues for hand placement and hip extension during transfer. Patient was successful with voiding on BSC over the toilet. Required +2 assist for peri-care and placed clean incontinence brief in w/c to don in sitting due to max A transfer back to w/c from [facility].   Instructed pt in results of PT evaluation as detailed above, PT POC, rehab potential, rehab goals, and discharge recommendations. Additionally discussed CIR's policies regarding fall safety and use of chair alarm and/or quick release belt. Pt verbalized understanding and in agreement. Will update pt's family members as they become available.   Patient in [place] w/c when handed off to [place], [place], at end of session.Marland Kitchen   Discharge Criteria: Patient will be discharged from PT if patient refuses treatment 3 consecutive times without medical reason, if treatment goals not met, if there is a change in medical status, if patient makes no progress towards goals or if patient is discharged from hospital.  The above assessment, treatment plan, treatment alternatives and goals were discussed and mutually agreed upon: No family  available/patient unable  Doreene Burke PT, DPT  04/22/2021, 12:51 PM

## 2021-04-22 NOTE — Progress Notes (Signed)
Patient ID: [name], male   DOB: 01/18/1946, 75 y.o.   MRN: [id number]  This SW familiar with patient. Pt is a return patient. Please see assessment completed on 6/9. SW will follow up with patient and his wife to confirm there will be no barriers to discharge.   Loralee Pacas, MSW, [place] Office: [phone] Cell: [phone] Fax: [phone]

## 2021-04-22 NOTE — Progress Notes (Signed)
Inpatient Rehabilitation  Patient information reviewed and entered into eRehab system by Bali Lyn Iyla Balzarini, OTR/L.   Information including medical coding, functional ability and quality indicators will be reviewed and updated through discharge.    

## 2021-04-22 NOTE — Progress Notes (Signed)
PROGRESS NOTE   Subjective/Complaints: No complaints this morning Agree with Colletta [place] regarding palliative care consult, placed Speech and strength much better today!  ROS: denies pain   Objective:   No results found. Recent Labs    04/21/21 0359 04/22/21 0433  WBC 5.5 4.5  HGB 14.0 14.4  HCT 40.7 39.9  PLT 221 231   Recent Labs    04/21/21 0359 04/22/21 0433  NA 132* 133*  K 3.4* 3.5  CL 94* 95*  CO2 27 29  GLUCOSE 134* 148*  BUN 8 10  CREATININE 0.50* 0.80  CALCIUM 9.1 9.6    Intake/Output Summary (Last 24 hours) at 04/22/2021 1142 Last data filed at 04/22/2021 1123 Gross per 24 hour  Intake 860 ml  Output 850 ml  Net 10 ml        Physical Exam: Vital Signs Blood pressure (!) 144/76, pulse (!) 59, temperature 97.8 F (36.6 C), resp. rate 16, height 5\' 8"  (1.727 m), weight 67.1 kg, SpO2 96 %. Gen: no distress, normal appearing HEENT: oral mucosa pink and moist, NCAT Cardio: Bradycardia Chest: normal effort, normal rate of breathing Abd: soft, non-distended Ext: no edema Psych: pleasant, normal affect Skin: intact Neurological:    Comments: Patient is alert. He provides his name and age but needed multiple cues for place and situation.  Limited awareness of deficits.  He does follow simple commands- 4/5 strength throughout. Speech much clearer. Makes eye contact with me and his sister MSK: kyphotic posture.    Assessment/Plan: 1. Functional deficits which require 3+ hours per day of interdisciplinary therapy in a comprehensive inpatient rehab setting. Physiatrist is providing close team supervision and 24 hour management of active medical problems listed below. Physiatrist and rehab team continue to assess barriers to discharge/monitor patient progress toward functional and medical goals  Care Tool:  Bathing              Bathing assist       Upper Body Dressing/Undressing Upper  body dressing   What is the patient wearing?: Hospital gown only    Upper body assist Assist Level: Minimal Assistance - Patient > 75%    Lower Body Dressing/Undressing Lower body dressing      What is the patient wearing?: Incontinence brief     Lower body assist Assist for lower body dressing: 2 Helpers     Toileting Toileting    Toileting assist Assist for toileting: Maximal Assistance - Patient 25 - 49%     Transfers Chair/bed transfer  Transfers assist     Chair/bed transfer assist level: Maximal Assistance - Patient 25 - 49%     Locomotion Ambulation   Ambulation assist              Walk 10 feet activity   Assist           Walk 50 feet activity   Assist           Walk 150 feet activity   Assist           Walk 10 feet on uneven surface  activity   Assist           Wheelchair  Assist               Wheelchair 50 feet with 2 turns activity    Assist            Wheelchair 150 feet activity     Assist          Blood pressure (!) 144/76, pulse (!) 59, temperature 97.8 F (36.6 C), resp. rate 16, height 5\' 8"  (1.727 m), weight 67.1 kg, SpO2 96 %.    Medical Problem List and Plan: 1.  Left hemiparesis with visual deficits secondary to metastatic melanoma status postcraniotomy for resection of right frontal tumor 03/31/2021.  Pathology positive for malignant melanoma             -patient may shower             -ELOS/Goals: MinA 10-14 days 2.  Impaired mobility/s/p craniotomy: -DVT/anticoagulation: Continue Subcutaneous heparin 5000 units every 12 hours until cleared by NSGY. Please initiate heparin education to family.             -antiplatelet therapy: N/A 3. Pain Management: Tylenol as needed 4. Impaired attention: Continue Ritalin 5 mg twice daily.  Provide emotional support             -antipsychotic agents: N/A 5. Neuropsych: This patient is not capable of making decisions on his own  behalf. 6. Skin/Wound Care: Routine skin checks 7. Fluids/Electrolytes/Nutrition: Routine in and outs with follow-up chemistries 8.  Seizure prophylaxis.  Continue Keppra 500 mg twice daily until 6/27. 9.  Hypertension.  Norvasc 5 mg daily, Avapro 300 mg daily, HCTZ 25 mg daily.  Monitor with increased mobility 10.  Hyponatremia.  Sodium chloride tablets 2 g twice daily.  Improving, repeat Monday 11.  History of glaucoma.  Followed by Dr. Satira Sark.  Continue eyedrops as directed. 12.  History of asthma.  Continue inhalers as directed 13. Bradycardia: continue to monitor TID  LOS: 1 days A FACE TO FACE EVALUATION WAS PERFORMED  Shannon Horton 04/22/2021, 11:42 AM

## 2021-04-23 DIAGNOSIS — Z7189 Other specified counseling: Secondary | ICD-10-CM

## 2021-04-23 DIAGNOSIS — Z66 Do not resuscitate: Secondary | ICD-10-CM

## 2021-04-23 DIAGNOSIS — E440 Moderate protein-calorie malnutrition: Secondary | ICD-10-CM | POA: Insufficient documentation

## 2021-04-23 DIAGNOSIS — Z515 Encounter for palliative care: Secondary | ICD-10-CM

## 2021-04-23 LAB — BASIC METABOLIC PANEL
Anion gap: 9 (ref 5–15)
BUN: 14 mg/dL (ref 8–23)
CO2: 30 mmol/L (ref 22–32)
Calcium: 9.5 mg/dL (ref 8.9–10.3)
Chloride: 95 mmol/L — ABNORMAL LOW (ref 98–111)
Creatinine, Ser: 0.77 mg/dL (ref 0.61–1.24)
GFR, Estimated: >60 mL/min (ref >60)
Glucose, Bld: 81 mg/dL (ref 70–99)
Potassium: 3 mmol/L — ABNORMAL LOW (ref 3.5–5.1)
Sodium: 134 mmol/L — ABNORMAL LOW (ref 135–145)

## 2021-04-23 LAB — IRON AND TIBC
Iron: 132 ug/dL (ref 45–182)
Saturation Ratios: 39 % (ref 17.9–39.5)
TIBC: 342 ug/dL (ref 250–450)
UIBC: 210 ug/dL

## 2021-04-23 LAB — MAGNESIUM: Magnesium: 2 mg/dL (ref 1.7–2.4)

## 2021-04-23 NOTE — Progress Notes (Signed)
Patient ID: Shannon Horton., male   DOB: 1946/05/15, 75 y.o.   MRN: [id number]  SW met with pt wife to review statement of service, and HHA list left in room. SW informed will f/u to discuss HHA preference. She intends to speak with her son Shannon Horton to get guidance.  Loralee Pacas, MSW, [place] Office: [phone] Cell: [phone] Fax: [phone]

## 2021-04-23 NOTE — Care Management (Signed)
[facility] Individual Statement of Services  Patient Name:  Shannon Horton.  Date:  04/23/2021  Welcome to the [facility].  Our goal is to provide you with an individualized program based on your diagnosis and situation, designed to meet your specific needs.  With this comprehensive rehabilitation program, you will be expected to participate in at least 3 hours of rehabilitation therapies Monday-Friday, with modified therapy programming on the weekends.  Your rehabilitation program will include the following services:  Physical Therapy (PT), Occupational Therapy (OT), Speech Therapy (ST), 24 hour per day rehabilitation nursing, Therapeutic Recreaction (TR), Psychology, Neuropsychology, Care Coordinator, Rehabilitation Medicine, [place], and Other  Weekly team conferences will be held on Tuesdays to discuss your progress.  Your Inpatient Rehabilitation Care Coordinator will talk with you frequently to get your input and to update you on team discussions.  Team conferences with you and your family in attendance may also be held.  Expected length of stay: 2-2.5 weeks    Overall anticipated outcome: Minimal Assistance  Depending on your progress and recovery, your program may change. Your Inpatient Rehabilitation Care Coordinator will coordinate services and will keep you informed of any changes. Your Inpatient Rehabilitation Care Coordinator's name and contact numbers are listed  below.  The following services may also be recommended but are not provided by the [facility] will be made to provide these services after discharge if needed.  Arrangements include referral to agencies that provide these services.  Your insurance has been verified to be:  [employer]  Your primary doctor is:  Alysia Penna  Pertinent information will be shared with your doctor and your insurance company.  Inpatient Rehabilitation Care Coordinator:  Cathleen Corti [phone] or [phone]  Information discussed with and copy given to patient by: Rana Snare, 04/23/2021, 9:39 AM

## 2021-04-23 NOTE — Progress Notes (Signed)
Occupational Therapy Session Note  Patient Details  Name: Shannon A Shew Jr. MRN: [id number] Date of Birth: 12/24/1945  Today's Date: 04/23/2021 OT Individual Time: 0800-0900 OT Individual Time Calculation (min): 60 min    Short Term Goals: Week 1:  OT Short Term Goal 1 (Week 1): pt will don shirt in supported sitting with S OT Short Term Goal 2 (Week 1): Pt will thread BLE into pants wiht no more than MIN A for sitting balance during supported sitting OT Short Term Goal 3 (Week 1): Pt will transfer to toilet wiht MOD A OT Short Term Goal 4 (Week 1): Pt will terminate oral care with no more than 2 VC  Skilled Therapeutic Interventions/Progress Updates:     Pt received in bed with 0 out of 10 pain. Pt agreeable to OT tx but initially confused thinking we were at [employer] in the hospital. Reoriented to place/situation/POC as pt requesting to go relax at home.   ADL:  Pt completes LB dressing with MOD A sit to stand and improved sitting banace initially at EOB, however with pulling up pants at EOB from ankles to knees pt leans L/posteriorly requiring MOD A to sit up at midline. A to advance pants past hips. Pt completes footwear with S seated in TIS slightly tilted back to improve sitting balnae  Therapeutic activity Beginning of session spent reorienting pt, pt wanting ot call wife. Pt unable to recall number and perseverative on not having cell phone. Pt able to state date based on schedule but unable to report to wife schedule despite being written on paper. Ot requires total cuing/answers wifes questions d/t cognition.  Transfer training via stand pivot with MOD-MAX A with OT "dancing" with pt reaching around OT shoulders and looking over head to encourage upright posture. Pt with increased hip flexions when stepping requiring Max facilitation into extension. Pt completes 2x10 sit ups to large wedge with BUE on head and pressing shoulders back to encourage thoracic extension and  chest wall expansion. Lateral taps in B directions 2x5 each side with tactile cues for lateral extension to midline.  Pt left at end of session in TIS with exit alarm on, at RN station for supervision and all needs met   Therapy Documentation Precautions:  Precautions Precautions: Fall Precaution Comments: R crani Restrictions Weight Bearing Restrictions: No General:   Vital Signs: Therapy Vitals Temp: 97.8 F (36.6 C) Temp Source: Oral Pulse Rate: 61 Resp: 16 BP: 127/75 Patient Position (if appropriate): Lying Oxygen Therapy SpO2: 95 % O2 Device: Room Air Pain:   ADL: ADL Eating: Set up Grooming: Minimal assistance Where Assessed-Grooming: Sitting at sink Upper Body Bathing: Minimal assistance Where Assessed-Upper Body Bathing: Wheelchair, Sitting at sink Lower Body Bathing: Moderate assistance Where Assessed-Lower Body Bathing: Wheelchair, Sitting at sink Upper Body Dressing: Minimal assistance Where Assessed-Upper Body Dressing: Sitting at sink, Wheelchair Lower Body Dressing: Moderate assistance Where Assessed-Lower Body Dressing: Sitting at sink, Standing at sink Vision   Perception    Praxis   Exercises:   Other Treatments:     Therapy/Group: Individual Therapy  Stephanie M Schlosser 04/23/2021, 6:48 AM 

## 2021-04-23 NOTE — Consult Note (Signed)
Consultation Note Date: 04/23/2021   Patient Name: Shannon Horton.  DOB: 24-Feb-1946  MRN: [id number]  Age / Sex: 75 y.o., male  PCP: Shannon Morale, MD Referring Physician: [name], MD  Reason for Consultation: Establishing goals of care  HPI/Patient Profile: 75 y.o. male  with past medical history of glaucoma, hypertension, tobacco use, and melanoma x2 removed from his right calf and left flank 2011 admitted on 04/21/2021 with multiple falls prior to admission, left hemiparesis, shuffling gait, and persistent headaches.  MRI of the brain showed a 3.8 cm enhancing and hemorrhagic mass in the right basal ganglia/frontal lobe region with a 5 mm left midline shift.  CT of the chest and abdomen was notable for left upper lobe pulmonary nodule, left hilar adenopathy, right upper lobe pulmonary nodule, left lower quadrant mass arising from the right adrenal gland, and hepatic lesions. CT-guided biopsy of the adrenal mass 03/30/2021 positive for malignancy.  Patient underwent right craniotomy resection of tumor 03/31/2021 per Shannon Horton.  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 Patient with ongoing left hemiparesis and visual deficits secondary to metastatic melanoma. Impaired mobility and cognition also continues. PMT consulted for Shannon Horton discussions.   Clinical Assessment and Goals of Care: I have reviewed medical records including EPIC notes, labs and imaging, assessed the patient and then met with his spouse Shannon Horton to discuss diagnosis prognosis, GOC, EOL wishes, disposition and options.  I introduced Palliative Medicine as specialized medical care for people living with serious illness. It focuses on providing relief from the symptoms and stress of a serious illness. The goal is to improve quality of life for both the patient and the family.  We discussed a brief life review of the patient. Shannon Horton tells me patient is an avid  cyclist - has always been very active. They have 3 sons - all in [place].   As far as functional and nutritional status, wife reports he was doing great until about April of this year - this is when difficulty balancing began and his decline started.     We discussed patient's current illness and what it means in the larger context of patient's on-going co-morbidities.  Natural disease trajectory and expectations at EOL were discussed. We discussed his metastatic cancer. We discussed her plans to meet with oncology outpatient. She shares her eagerness to meet with oncology. We discussed that his treatment options will likely be palliative rather than curative. She expressed understanding and questions if patient will be able to tolerate any form of treatment.   I attempted to elicit values and goals of care important to the patient.  Wife does share that patient has a living will. Wife tells me it would be important for patient to be at home. However, she also expresses some fear about caring for him at home. She shares that he is very weak - has essentially been bed bound for one month and she fears she will not be able to provide the level of care he will need at home. She is open to discharge to a SNF if his function does not improve.   The difference between aggressive medical intervention and comfort care was considered in light of the patient's goals of care.   Advance directives, concepts specific to code status, artificial feeding and hydration, and rehospitalization were considered and discussed.  I completed a MOST form today. Wife outlined their wishes for the following treatment decisions:  Cardiopulmonary Resuscitation: Do Not Attempt Resuscitation (DNR/No CPR)  Medical Interventions: Limited Additional Interventions: Use medical treatment, IV fluids and cardiac monitoring as indicated, DO NOT USE intubation or mechanical ventilation. May consider use of less invasive airway support such as  BiPAP or CPAP. Also provide comfort measures. Transfer to the hospital if indicated. Avoid intensive care.   Antibiotics: Antibiotics if indicated  IV Fluids: IV fluids if indicated  Feeding Tube: No feeding tube   Encouraged wife to consider DNR/DNI status understanding evidenced based poor outcomes in similar hospitalized patients, as the cause of the arrest is likely associated with chronic/terminal disease rather than a reversible acute cardio-pulmonary event. Wife agrees and feels that DNR status is in line with patient's goals.   Discussed with wife the importance of continued conversation with family and the medical providers regarding overall plan of care and treatment options, ensuring decisions are within the context of the patient's values and GOCs.    Briefly introduced hospice support at home.   Questions and concerns were addressed. The family was encouraged to call with questions or concerns.   Primary Decision Maker NEXT OF KIN wife Shannon Horton Giovanetti   SUMMARY OF RECOMMENDATIONS   - code status changed to DNR - MOST completed as above: okay with future hospitalizations for now, NO feeding tube **Wife requests that medical team speak frankly with her about expectations moving forward so that she can plan adequately - she fears she will not be able to provide the level of support the patient will need at home** - PMT will follow  Code Status/Advance Care Planning: DNR  Additional Recommendations (Limitations, Scope, Preferences): No Artificial Feeding  Discharge Planning: To Be Determined      Primary Diagnoses: Present on Admission:  Malignant melanoma ([place])   I have reviewed the medical record, interviewed the patient and family, and examined the patient. The following aspects are pertinent.  Past Medical History:  Diagnosis Date   Allergic rhinitis    gets shots per Shannon Horton   Allergy    sees Shannon Horton    Asthma    Glaucoma    sees Shannon Horton     Hypertension    Malignant melanoma [facility])    right lower leg, diagnosed on1/21/11   Social History   Socioeconomic History   Marital status: Married    Spouse name: Not on file   Number of children: Not on file   Years of education: Not on file   Highest education level: Not on file  Occupational History   Not on file  Tobacco Use   Smoking status: Former    Pack years: 0.00    Types: Cigarettes    Quit date: 1992    Years since quitting: 30.4   Smokeless tobacco: Never   Tobacco comments:    quit 30 years ago  Vaping Use   Vaping Use: Never used  Substance and Sexual Activity   Alcohol use: Yes    Alcohol/week: 5.0 - 10.0 standard drinks    Types: 5 - 10 Standard drinks or equivalent per week    Comment: 1-2 beers/day   Drug use: No   Sexual activity: Not Currently  Other Topics Concern   Not on file  Social History Narrative   Right handed   One story home   Drinks caffeinew   Social Determinants of Health   Financial Resource Strain: Low Risk    Difficulty of Paying Living Expenses: Not hard at all  Food Insecurity: No Food Insecurity   Worried About Running  Out of Food in the Last Year: Never true   Ran Out of Food in the Last Year: Never true  Transportation Needs: No Transportation Needs   Lack of Transportation (Medical): No   Lack of Transportation (Non-Medical): No  Physical Activity: Sufficiently Active   Days of Exercise per Week: 4 days   Minutes of Exercise per Session: 40 min  Stress: No Stress Concern Present   Feeling of Stress : Not at all  Social Connections: Moderately Integrated   Frequency of Communication with Friends and Family: Three times a week   Frequency of Social Gatherings with Friends and Family: More than three times a week   Attends Religious Services: Never   Marine scientist or Organizations: Yes   Attends Archivist Meetings: Never   Marital Status: Married   Family History  Problem Relation Age of Onset    Hypertension Other    Colon polyps Mother    Hypertension Mother    Diabetes Father    Liver disease Maternal Uncle    Cancer Brother        throat and lung (smoker)   Colon cancer Neg Hx    Stomach cancer Neg Hx    Esophageal cancer Neg Hx    Pancreatic cancer Neg Hx    Scheduled Meds:  amLODipine  5 mg Oral Daily   brinzolamide  1 drop Both Eyes BID   budesonide  0.25 mg Nebulization BID   dexamethasone  2 mg Oral Q12H   feeding supplement  237 mL Oral TID BM   heparin  5,000 Units Subcutaneous BID   irbesartan  300 mg Oral Daily   And   hydrochlorothiazide  25 mg Oral Daily   latanoprost  1 drop Both Eyes QHS   levETIRAcetam  250 mg Oral BID   methylphenidate  5 mg Oral BID WC   multivitamin with minerals  1 tablet Oral Daily   pantoprazole  40 mg Oral Daily   sodium chloride  2 g Oral BID WC   Continuous Infusions: PRN Meds:.acetaminophen, albuterol, docusate sodium, polyethylene glycol Allergies  Allergen Reactions   Banana Rash   Review of Systems  Unable to perform ROS: Mental status change   Physical Exam Constitutional:      General: He is not in acute distress.    Comments: lethargic  Pulmonary:     Effort: Pulmonary effort is normal.  Skin:    General: Skin is warm and dry.  Neurological:     Mental Status: He is disoriented.    Vital Signs: BP (!) 116/44 (BP Location: Left Arm)   Pulse 73   Temp 98.6 F (37 C)   Resp 19   Ht '5\' 8"'  (1.727 m)   Wt 67.1 kg   SpO2 99%   BMI 22.49 kg/m  Pain Scale: 0-10   Pain Score: 0-No pain   SpO2: SpO2: 99 % O2 Device:SpO2: 99 % O2 Flow Rate: .   IO: Intake/output summary:  Intake/Output Summary (Last 24 hours) at 04/23/2021 1405 Last data filed at 04/23/2021 1350 Gross per 24 hour  Intake 240 ml  Output 700 ml  Net -460 ml    LBM: Last BM Date: 04/20/21 Baseline Weight: Weight: 67.1 kg Most recent weight: Weight: 67.1 kg     Palliative Assessment/Data: PPS 50%   Flowsheet Rows     Flowsheet Row Most Recent Value  Intake Tab   Referral Department --  [rehab]  Unit at Time of  Referral --  [rehab]  Palliative Care Primary Diagnosis Cancer  Date Notified 04/22/21  Palliative Care Type Return patient Palliative Care  Reason for referral Clarify Goals of Care  Date of Admission 04/21/21  Date first seen by Palliative Care 04/22/21  # of days Palliative referral response time 0 Day(s)  # of days IP prior to Palliative referral 1  Clinical Assessment   Psychosocial & Spiritual Assessment   Palliative Care Outcomes        Time Total: 80 minutes Greater than 50%  of this time was spent counseling and coordinating care related to the above assessment and plan.  Juel Burrow, DNP, AGNP-C Palliative Medicine Team [phone] Pager: [phone]

## 2021-04-23 NOTE — Progress Notes (Signed)
Physical Therapy Session Note  Patient Details  Name: Shannon Horton. MRN: [id number] Date of Birth: Mar 26, 1946  Today's Date: 04/23/2021 PT Individual Time: 4287-6811 PT Individual Time Calculation (min): 30 min   Short Term Goals: Week 1:  PT Short Term Goal 1 (Week 1): Patient will perform basic transfers with mod A consistently. PT Short Term Goal 2 (Week 1): Patient will perform bed mobility with min A. PT Short Term Goal 3 (Week 1): Patient will perform sitting balance with mod A consistently. PT Short Term Goal 4 (Week 1): Patient will initiate gait training.  Skilled Therapeutic Interventions/Progress Updates:    Pt received sitting in TIS w/c at nurses station - handoff of care to PT. Pt's wife arriving and requesting to join session. Pt denies any pain and wheeled to ortho gym for time management. Pt able to scoot forwards towards edge of w/c with minA and cues for reciprocal scooting. Completed squat<>pivot transfer with min/modA from TIS w/c to mat table and able to laterally scoot along mat table with CGA. Worked on repeated sit<>stands with therapist on stool and pt's arm's on therapists shoulders, minA for powering to rise and modA for standing balance due to strong posterior bias. Provided simple/direct verbal cues such as "butt forward and shoulders back" to promote full upright posture due to significant thoracic kyphosis and "sinking" position. He was able to initiate but unable to maintain this posture.  Next, completed seated ball toss with unweighted ball to challenge sitting balance - pt with x1 large LOB forwards during this activity needing modA for recovery - had wife sit next to him for reminder of session for safety concerns. 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 Next, worked on seated rotations in sitting to  promote gross spinal segment mobility and reduce stiffness to allow improved functional mobility. Pt completed squat<>pivot transfer with min.modA back to his TIS w/c and was returned to room where he remained seated with safety belt alarm on and wife at bedside.   Of note, pt quite verbose during session and had difficulty completing his thoughts - he needed gentle redirection throughout for task attenuation as he was easily distracted with his thoughts.   Therapy Documentation Precautions:  Precautions Precautions: Fall Precaution Comments: R crani Restrictions Weight Bearing Restrictions: No General:    Therapy/Group: Individual Therapy  [name] 04/23/2021, 7:54 AM

## 2021-04-23 NOTE — Progress Notes (Signed)
PROGRESS NOTE   Subjective/Complaints: Patient's chart reviewed- No issues reported overnight Vitals signs stable  Palliative care meeting scheduled with wife today at 3:30pm.  ROS: denies pain, constipation, +fatigue   Objective:   No results found. Recent Labs    04/21/21 0359 04/22/21 0433  WBC 5.5 4.5  HGB 14.0 14.4  HCT 40.7 39.9  PLT 221 231   Recent Labs    04/21/21 0359 04/22/21 0433  NA 132* 133*  K 3.4* 3.5  CL 94* 95*  CO2 27 29  GLUCOSE 134* 148*  BUN 8 10  CREATININE 0.50* 0.80  CALCIUM 9.1 9.6    Intake/Output Summary (Last 24 hours) at 04/23/2021 0827 Last data filed at 04/23/2021 0343 Gross per 24 hour  Intake 840 ml  Output 700 ml  Net 140 ml        Physical Exam: Vital Signs Blood pressure 127/75, pulse 61, temperature 97.8 F (36.6 C), temperature source Oral, resp. rate 16, height 5\' 8"  (1.727 m), weight 67.1 kg, SpO2 95 %. Gen: no distress, normal appearing HEENT: oral mucosa pink and moist, NCAT Cardio: Reg rate Chest: normal effort, normal rate of breathing Abd: soft, non-distended Ext: no edema Psych: pleasant, normal affect Skin: IV in right arm Neurological:    Comments: Patient is alert. He provides his name and age but needed multiple cues for place and situation.  Limited awareness of deficits.  He does follow simple commands- 4/5 strength throughout. Speech much clearer. Makes eye contact with me and his sister MSK: kyphotic posture.    Assessment/Plan: 1. Functional deficits which require 3+ hours per day of interdisciplinary therapy in a comprehensive inpatient rehab setting. Physiatrist is providing close team supervision and 24 hour management of active medical problems listed below. Physiatrist and rehab team continue to assess barriers to discharge/monitor patient progress toward functional and medical goals  Care Tool:  Bathing    Body parts bathed by  patient: Right arm, Left arm, Chest, Abdomen, Right upper leg, Left upper leg, Face   Body parts bathed by helper: Right lower leg, Left lower leg Body parts n/a: Buttocks, Front perineal area   Bathing assist Assist Level: Maximal Assistance - Patient 24 - 49% (for sitting balance seated at sink)     Upper Body Dressing/Undressing Upper body dressing   What is the patient wearing?: Pull over shirt    Upper body assist Assist Level: Minimal Assistance - Patient > 75%    Lower Body Dressing/Undressing Lower body dressing      What is the patient wearing?: Incontinence brief     Lower body assist Assist for lower body dressing: Maximal Assistance - Patient 25 - 49%     Toileting Toileting    Toileting assist Assist for toileting: Maximal Assistance - Patient 25 - 49% Assistive Device Comment: Urinal   Transfers Chair/bed transfer  Transfers assist     Chair/bed transfer assist level: Maximal Assistance - Patient 25 - 49%     Locomotion Ambulation   Ambulation assist   Ambulation activity did not occur: Safety/medical concerns (patient unable to stand >5 sec with max A before LOB and total A to sit)  Walk 10 feet activity   Assist  Walk 10 feet activity did not occur: Safety/medical concerns        Walk 50 feet activity   Assist Walk 50 feet with 2 turns activity did not occur: Safety/medical concerns         Walk 150 feet activity   Assist Walk 150 feet activity did not occur: Safety/medical concerns         Walk 10 feet on uneven surface  activity   Assist Walk 10 feet on uneven surfaces activity did not occur: Safety/medical concerns         Wheelchair     Assist     Wheelchair activity did not occur: Safety/medical concerns (patient with significant forward lean and needed PT to block him in sitting for safety, used TIS w/c)         Wheelchair 50 feet with 2 turns activity    Assist    Wheelchair 50  feet with 2 turns activity did not occur: Safety/medical concerns       Wheelchair 150 feet activity     Assist  Wheelchair 150 feet activity did not occur: Safety/medical concerns       Blood pressure 127/75, pulse 61, temperature 97.8 F (36.6 C), temperature source Oral, resp. rate 16, height 5\' 8"  (1.727 m), weight 67.1 kg, SpO2 95 %.    Medical Problem List and Plan: 1.  Left hemiparesis with visual deficits secondary to metastatic melanoma status postcraniotomy for resection of right frontal tumor 03/31/2021.  Pathology positive for malignant melanoma             -patient may shower             -ELOS/Goals: MinA 10-14 days  -Continue CIR 2.  Impaired mobility/s/p craniotomy: -DVT/anticoagulation: Continue Subcutaneous heparin 5000 units every 12 hours until cleared by NSGY. Please initiate heparin education to family.             -antiplatelet therapy: N/A 3. Pain Management: Tylenol as needed 4. Impaired attention: Continue Ritalin 5 mg twice daily.  Provide emotional support             -antipsychotic agents: N/A 5. Neuropsych: This patient is not capable of making decisions on his own behalf. 6. Skin/Wound Care: Routine skin checks 7. Fluids/Electrolytes/Nutrition: Routine in and outs with follow-up chemistries 8.  Seizure prophylaxis.  Continue Keppra 500 mg twice daily until 6/27. 9.  Hypertension.  Norvasc 5 mg daily, Avapro 300 mg daily, HCTZ 25 mg daily.  Monitor with increased mobility 10.  Hyponatremia.  Sodium chloride tablets 2 g twice daily.  Improving, repeat Monday 11.  History of glaucoma.  Followed by Dr. Satira Sark.  Continue eyedrops as directed. 12.  History of asthma.  Continue inhalers as directed 13. Bradycardia: continue to monitor TID 14. Hypomagnesemia: check level today.   LOS: 2 days A FACE TO FACE EVALUATION WAS PERFORMED  Shannon Horton Izora Benn 04/23/2021, 8:27 AM
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
## 2021-04-23 NOTE — Progress Notes (Signed)
Physical Therapy Session Note  Patient Details  Name: Shannon Horton. MRN: [id number] Date of Birth: September 14, 1946  Today's Date: 04/23/2021 PT Individual Time: 0915-1015 PT Individual Time Calculation (min): 60 min   Short Term Goals: Week 1:  PT Short Term Goal 1 (Week 1): Patient will perform basic transfers with mod A consistently. PT Short Term Goal 2 (Week 1): Patient will perform bed mobility with min A. PT Short Term Goal 3 (Week 1): Patient will perform sitting balance with mod A consistently. PT Short Term Goal 4 (Week 1): Patient will initiate gait training.  Skilled Therapeutic Interventions/Progress Updates: Pt presents sitting in TIS, reclined at nursing station.  Pt agreeable to therapy.  Pt wheeled to small gym for energy conservation.  Pt transferred sit to stand and then SPT w/c > mat table w/ mod A.  Pt performed seated reaching and stacking cones, crossing midline w/ occasional LOB to left.  Pt performed overhead shoulder flexion holding beach ball, encouraging maintaining elbow extension and then following ball overhead w/ eyes.  Improved trunk extension, but also LOB posteriorly x 30% of time.  Pt performed squat pivot mat table > w/c w/ mod A and verbal cues for hand placement.  Pt wheeled to main gym for // bars.  Pt transferred sit to stand w/ mod A and verbal cues for initiation.  Pt amb forward and back in bars w/ mod A and no blocking of knees although close proximity to L knee or safety.  Pt then transferred sit to stand to RW w/ mod A and amb 25' w/ mod A and verbal cues for step length (too long of a step) and for posture.  Improved gait pattern when upright posture.  Pt performed 1 trial w/ turn to return to seat.  Pt returned to nurses station w/ TIS reclined and chair alarm on.     Therapy Documentation Precautions:  Precautions Precautions: Fall Precaution Comments: R crani Restrictions Weight Bearing Restrictions: No General:   Vital Signs: Oxygen  Therapy O2 Device: Room Air Pain:no c/o   Mobility:   Locomotion :    Trunk/Postural Assessment :    Balance:   Exercises:   Other Treatments:      Therapy/Group: Individual Therapy  Ladoris Gene 04/23/2021, 10:23 AM

## 2021-04-24 MED ORDER — POTASSIUM CHLORIDE 20 MEQ PO PACK
40.0000 meq | PACK | Freq: Two times a day (BID) | ORAL | Status: DC
  Administered 2021-04-24 – 2021-04-26 (×5): 40 meq via ORAL
  Filled 2021-04-24 (×5): qty 2

## 2021-04-24 NOTE — IPOC Note (Signed)
Overall Plan of Care (IPOC) Patient Details Name: Shannon Horton. MRN: [id number] DOB: [birth year]  Admitting Diagnosis: <principal problem not specified>  Hospital Problems: Active Problems:   Malignant melanoma ([place])   Malnutrition of moderate degree     Functional Problem List: Nursing Bladder, Bowel, Endurance, Medication Management, Pain, Safety, Nutrition  PT Balance, Behavior, Edema, Endurance, Motor, Nutrition, Pain, Perception, Safety, Sensory, Skin Integrity  OT Balance, Cognition, Endurance, Motor, Nutrition, Perception, Safety, Vision, Sensory  SLP Cognition  TR         Basic ADL's: OT Grooming, Bathing, Dressing, Toileting     Advanced  ADL's: OT       Transfers: PT Bed Mobility, Bed to Chair, Car, Manufacturing systems engineer, Metallurgist: PT Ambulation, Emergency planning/management officer, Stairs     Additional Impairments: OT    SLP Social Cognition   Attention, Memory, Problem Solving, Awareness  TR      Anticipated Outcomes Item Anticipated Outcome  Self Feeding S  Swallowing      Basic self-care  MIN  Toileting  MIN   Bathroom Transfers MIN  Bowel/Bladder  supervision assist  Transfers  min A using LRAD  Locomotion  min A 25 ft using LRAD  Communication     Cognition  Min A  Pain  < 3  Safety/Judgment  supervision assist and no falls   Therapy Plan: PT Intensity: Minimum of 1-2 x/day ,45 to 90 minutes PT Frequency: Total of 15 hours over 7 days of combined therapies PT Duration Estimated Length of Stay: 2-2.5 weeks OT Intensity: Minimum of 1-2 x/day, 45 to 90 minutes OT Frequency: 5 out of 7 days OT Duration/Estimated Length of Stay: 2-2.5 SLP Intensity: Minumum of 1-2 x/day, 30 to 90 minutes SLP Frequency: 3 to 5 out of 7 days SLP Duration/Estimated Length of Stay: 2-2.5   Due to the current state of emergency, patients may not be receiving their 3-hours of Medicare-mandated therapy.   Team Interventions: Nursing  Interventions Patient/Family Education, Bladder Management, Bowel Management, Disease Management/Prevention, Pain Management, Medication Management, Discharge Planning, Psychosocial Support  PT interventions Ambulation/gait training, Cognitive remediation/compensation, DME/adaptive equipment instruction, Discharge planning, Functional mobility training, Pain management, Psychosocial support, Splinting/orthotics, Therapeutic Activities, UE/LE Strength taining/ROM, Visual/perceptual remediation/compensation, Wheelchair propulsion/positioning, UE/LE Coordination activities, Therapeutic Exercise, Stair training, Skin care/wound management, Patient/family education, Neuromuscular re-education, Functional electrical stimulation, Disease management/prevention, Academic librarian, Training and development officer  OT Interventions Training and development officer, Discharge planning, Functional electrical stimulation, Pain management, Self Care/advanced ADL retraining, Therapeutic Activities, UE/LE Coordination activities, Cognitive remediation/compensation, Disease mangement/prevention, Functional mobility training, Patient/family education, Skin care/wound managment, Therapeutic Exercise, Visual/perceptual remediation/compensation, UE/LE Strength taining/ROM, Splinting/orthotics, Psychosocial support, Neuromuscular re-education, DME/adaptive equipment instruction, Community reintegration  SLP Interventions Cueing hierarchy, Functional tasks, Patient/family education, Internal/external aids, Environmental controls, Therapeutic Activities  TR Interventions    SW/CM Interventions Psychosocial Support, Discharge Planning, Patient/Family Education   Barriers to Discharge MD  Medical stability  Nursing Decreased caregiver support, Home environment access/layout, Incontinence, Lack of/limited family support, Medication compliance, Behavior, Nutrition means 1 level home, 1 step to enter, spouse to provide 24/7 care, sons to  assist prn  PT Lack of/limited family support, Behavior, Incontinence    OT Incontinence, Neurogenic Bowel & Bladder, Lack of/limited family support    SLP      SW       Team Discharge Planning: Destination: PT-Home ,OT- Home , SLP-Home Projected Follow-up: PT-Home health PT, OT-  Home health OT, SLP-Home Health SLP, 24 hour supervision/assistance Projected Equipment  Needs: PT-To be determined, OT- 3 in 1 bedside comode, Tub/shower seat, SLP-None recommended by SLP Equipment Details: PT- , OT-  Patient/family involved in discharge planning: PT- Patient, Patient unable/family or caregiver not available,  OT-Patient, SLP-Family member/caregiver, Patient  MD ELOS: 10-14 days Medical Rehab Prognosis:  Excellent Assessment: Shannon Horton is a 75 year old man admitted to CIR with left hemiparesis with visual deficits secondary to metastatic melanoma status postcraniotomy for resection of right frontal tumor 03/31/2021.  Pathology positive for malignant melanoma. Active medical issues include hypomagnesemia and hyponatremia, HTN, history of glaucoma. Medications are being managed, and labs and vitals are being monitored regularly.     See Team Conference Notes for weekly updates to the plan of care

## 2021-04-24 NOTE — Progress Notes (Signed)
Palliative:  Stopped by briefly to check on patient - no family at bedside - appears he is video chatting a family member. He smiles and waves at me but I do not interrupt his time with family.   Signed DNR placed on chart.  Called wife to update and offer support - no answer. Voice mail left with call back number if she has any further needs.  Juel Burrow, DNP, AGNP-C Palliative Medicine Team Team Phone # [phone]  Pager # [phone]  NO CHARGE

## 2021-04-24 NOTE — Progress Notes (Signed)
PROGRESS NOTE   Subjective/Complaints: Patient's chart reviewed- No issues reported overnight Systolic BP elevated to 989Q K+ 3.0 yesterday: 31meq BID started, repeat K+ on 6/26.  Appreciate palliative care consult, code status changed to DNR.   ROS: denies pain, constipation, +fatigue   Objective:   No results found. Recent Labs    04/22/21 0433  WBC 4.5  HGB 14.4  HCT 39.9  PLT 231   Recent Labs    04/22/21 0433 04/23/21 1134  NA 133* 134*  K 3.5 3.0*  CL 95* 95*  CO2 29 30  GLUCOSE 148* 81  BUN 10 14  CREATININE 0.80 0.77  CALCIUM 9.6 9.5    Intake/Output Summary (Last 24 hours) at 04/24/2021 1194 Last data filed at 04/23/2021 2031 Gross per 24 hour  Intake 320 ml  Output 600 ml  Net -280 ml        Physical Exam: Vital Signs Blood pressure (!) 141/79, pulse 66, temperature 98.6 F (37 C), resp. rate 17, height 5\' 8"  (1.727 m), weight 67.1 kg, SpO2 99 %. Gen: no distress, normal appearing HEENT: oral mucosa pink and moist, NCAT Cardio: Reg rate Chest: normal effort, normal rate of breathing Abd: soft, non-distended Ext: no edema Psych: pleasant, normal affect Skin: IV in right arm Neurological:    Comments: Patient is alert. He provides his name and age but needed multiple cues for place and situation.  Limited awareness of deficits.  He does follow simple commands- 4/5 strength throughout. Speech much clearer. Makes eye contact with me and his sister MSK: kyphotic posture.    Assessment/Plan: 1. Functional deficits which require 3+ hours per day of interdisciplinary therapy in a comprehensive inpatient rehab setting. Physiatrist is providing close team supervision and 24 hour management of active medical problems listed below. Physiatrist and rehab team continue to assess barriers to discharge/monitor patient progress toward functional and medical goals  Care Tool:  Bathing    Body parts  bathed by patient: Right arm, Left arm, Chest, Abdomen, Right upper leg, Left upper leg, Face   Body parts bathed by helper: Right lower leg, Left lower leg Body parts n/a: Buttocks, Front perineal area   Bathing assist Assist Level: Maximal Assistance - Patient 24 - 49% (for sitting balance seated at sink)     Upper Body Dressing/Undressing Upper body dressing   What is the patient wearing?: Pull over shirt    Upper body assist Assist Level: Minimal Assistance - Patient > 75%    Lower Body Dressing/Undressing Lower body dressing      What is the patient wearing?: Incontinence brief     Lower body assist Assist for lower body dressing: Maximal Assistance - Patient 25 - 49%     Toileting Toileting    Toileting assist Assist for toileting: Maximal Assistance - Patient 25 - 49% Assistive Device Comment: urinal   Transfers Chair/bed transfer  Transfers assist     Chair/bed transfer assist level: Moderate Assistance - Patient 50 - 74% Chair/bed transfer assistive device:  (none, squat pivot.)   Locomotion Ambulation   Ambulation assist   Ambulation activity did not occur: Safety/medical concerns (patient unable to stand >5 sec with  max A before LOB and total A to sit)  Assist level: Moderate Assistance - Patient 50 - 74% Assistive device: Walker-rolling Max distance: 25   Walk 10 feet activity   Assist  Walk 10 feet activity did not occur: Safety/medical concerns  Assist level: Moderate Assistance - Patient - 50 - 74% Assistive device: Walker-rolling   Walk 50 feet activity   Assist Walk 50 feet with 2 turns activity did not occur: Safety/medical concerns         Walk 150 feet activity   Assist Walk 150 feet activity did not occur: Safety/medical concerns         Walk 10 feet on uneven surface  activity   Assist Walk 10 feet on uneven surfaces activity did not occur: Safety/medical concerns         Wheelchair     Assist      Wheelchair activity did not occur: Safety/medical concerns (patient with significant forward lean and needed PT to block him in sitting for safety, used TIS w/c)         Wheelchair 50 feet with 2 turns activity    Assist    Wheelchair 50 feet with 2 turns activity did not occur: Safety/medical concerns       Wheelchair 150 feet activity     Assist  Wheelchair 150 feet activity did not occur: Safety/medical concerns       Blood pressure (!) 141/79, pulse 66, temperature 98.6 F (37 C), resp. rate 17, height 5\' 8"  (1.727 m), weight 67.1 kg, SpO2 99 %.    Medical Problem List and Plan: 1.  Left hemiparesis with visual deficits secondary to metastatic melanoma status postcraniotomy for resection of right frontal tumor 03/31/2021.  Pathology positive for malignant melanoma             -patient may shower             -ELOS/Goals: MinA 10-14 days  -Continue CIR 2.  Impaired mobility/s/p craniotomy: -DVT/anticoagulation: Continue Subcutaneous heparin 5000 units every 12 hours until cleared by NSGY. Please initiate heparin education to family.             -antiplatelet therapy: N/A 3. Pain Management: Tylenol as needed 4. Impaired attention: Continue Ritalin 5 mg twice daily.  Provide emotional support             -antipsychotic agents: N/A 5. Neuropsych: This patient is not capable of making decisions on his own behalf. 6. Skin/Wound Care: Routine skin checks 7. Fluids/Electrolytes/Nutrition: Routine in and outs with follow-up chemistries 8.  Seizure prophylaxis.  Continue Keppra 500 mg twice daily until 6/27. 9.  Hypertension.  Norvasc 5 mg daily, Avapro 300 mg daily, HCTZ 25 mg daily.  Monitor with increased mobility 10.  Hyponatremia.  Sodium chloride tablets 2 g twice daily.  Improving, repeat Monday 11.  History of glaucoma.  Followed by Dr. Satira Sark.  Continue eyedrops as directed. 12.  History of asthma.  Continue inhalers as directed 13. Bradycardia: continue to  monitor TID, normalized. 14. Hypomagnesemia: magnesium normalized to 2.  15. Hypokalemia: K+ 3.0 on 6/24, start klor 73meq BID and repeat on 6/26.    LOS: 3 days A FACE TO FACE EVALUATION WAS PERFORMED  Izora Ribas 04/24/2021, 6:39 AM

## 2021-04-24 NOTE — Progress Notes (Signed)
Occupational Therapy Session Note  Patient Details  Name: Shannon Horton. MRN: [id number] Date of Birth: [birth year]  Today's Date: 04/24/2021 OT Individual Time: 1100-1200 OT Individual Time Calculation (min): 60 min    Short Term Goals: Week 1:  OT Short Term Goal 1 (Week 1): pt will don shirt in supported sitting with S OT Short Term Goal 2 (Week 1): Pt will thread BLE into pants wiht no more than MIN A for sitting balance during supported sitting OT Short Term Goal 3 (Week 1): Pt will transfer to toilet wiht MOD A OT Short Term Goal 4 (Week 1): Pt will terminate oral care with no more than 2 VC  Skilled Therapeutic Interventions/Progress Updates:     Pt received in bed with NT present finishing brief change and no pain  ADL:   Pt completes UB dressing with S slightly dumped in TIS for postural control assistance Pt completes LB dressing with MIN A for forward L LOB in TIS when threading BLE and A to advance pants past hips in standing with Lean and no awareness/iniitation to fix Pt completes footwear with MIN A to pull heel up in back of L shoe. Pt unaware heel still down despite cuing  Therapeutic activity Transfer training SPT OOB with MOD A to power up this date. But posture wilting as stepping to chair.  MOD A squat pivot to R with facilaition at hips. Pt completes lateral scooting across EOM with VC for upright posture/head hips relationship. Pt sits on EOM near wall and clasps hands behind head, holds eye contact with OT for 30 second rounds, leans R elbow into wall for midline orientation with max cuing.  3 SB transfers with CGA improving to A after board placement with VC for attention to LUE on arm rest! Pt pleased with this transfer.  Pt left at end of session in TIS at RN station with exit alarm on, call light in reach and all needs met   Therapy Documentation Precautions:  Precautions Precautions: Fall Precaution Comments: R crani Restrictions Weight  Bearing Restrictions: No General:   Vital Signs: Therapy Vitals Temp: 98.6 F (37 C) Pulse Rate: 66 Resp: 17 BP: (!) 141/79 Patient Position (if appropriate): Lying Oxygen Therapy SpO2: 99 % O2 Device: Room Air Pain:   ADL: ADL Eating: Set up Grooming: Minimal assistance Where Assessed-Grooming: Sitting at sink Upper Body Bathing: Minimal assistance Where Assessed-Upper Body Bathing: Wheelchair, Sitting at sink Lower Body Bathing: Moderate assistance Where Assessed-Lower Body Bathing: Wheelchair, Sitting at sink Upper Body Dressing: Minimal assistance Where Assessed-Upper Body Dressing: Sitting at sink, Wheelchair Lower Body Dressing: Moderate assistance Where Assessed-Lower Body Dressing: Sitting at sink, Standing at sink Vision   Perception    Praxis   Exercises:   Other Treatments:     Therapy/Group: Individual Therapy  Tonny Branch 04/24/2021, 6:58 AM

## 2021-04-24 NOTE — Progress Notes (Signed)
Physical Therapy Session Note  Patient Details  Name: Shannon Horton. MRN: [id number] Date of Birth: 05/10/1946  Today's Date: 04/24/2021 PT Individual Time: 1300-1345 PT Individual Time Calculation (min): 45 min   Short Term Goals: Week 1:  PT Short Term Goal 1 (Week 1): Patient will perform basic transfers with mod A consistently. PT Short Term Goal 2 (Week 1): Patient will perform bed mobility with min A. PT Short Term Goal 3 (Week 1): Patient will perform sitting balance with mod A consistently. PT Short Term Goal 4 (Week 1): Patient will initiate gait training. Week 2:    Week 3:     Skilled Therapeutic Interventions/Progress Updates:   Pain:  Pt w/no c/o pain.  Treatment to tolerance.  Rest breaks and repositioning as needed.  Pt initially supine and agreeable to treatment session w/focus on mobility.  Wife at bedside and encouraging patient. Pt supine to sit w/min assist, additional time.  Sit to stand w/+2 HHA min assist for transition, stand pivot transfer requires mod assist due to poor sequencing and motor planning.  Pt transported to gym for continued session. Sit to stand in parallel bars w/min assist.  Pt stands w/post R bias at hips and ant L bias at trunk but is able to correct to midline w/cues, maintains for brief period then gradually returns to this position, diffuclty attending to task/self distracts w/conversation. Gait in parallel bars x 38ft, postural deviations persist and pt w/post lean, moderate assist and max cues for safety/attention to task/posture. Worked on pregait postural control w/single stepping forward/reverse w/RLE and return to upright. Pt c/o discomfort from headrest, therapist adjusted for improved comfort. Pt wife in for last 15 min of session to observe.   At end of session, therapist provided pt w/choices for snack, declined mult items but agreed to "strawberry milkshake ([employer]).  Also agreed to strawberry yogurt.  Wife assisted pt  w/consuming.  (nursing notified therapist that pt ate very little for breakfast or lunch today.) Pt left oob in wc w/alarm belt set and needs in reach      Therapy Documentation Precautions:  Precautions Precautions: Fall Precaution Comments: R crani Restrictions Weight Bearing Restrictions: No    Therapy/Group: Individual Therapy Callie Fielding, [place] 04/24/2021, 4:55 PM

## 2021-04-24 NOTE — Progress Notes (Addendum)
Speech Language Pathology Daily Session Note  Patient Details  Name: Shannon Horton. MRN: [id number] Date of Birth: 03/07/1946  Today's Date: 04/24/2021 SLP Individual Time: 2548-6282 SLP Individual Time Calculation (min): 33 min SLP Missed Time: 27 Minutes Missed Time Reason: Other (Comment) (meeting with family via Portal in room)  Short Term Goals: Week 1: SLP Short Term Goal 1 (Week 1): Patient will consistently orient to place, time, and situation with Mod A verbal cues SLP Short Term Goal 2 (Week 1): Patient will utilize external memory aids to recall new, daily information with Min A verbal cues SLP Short Term Goal 3 (Week 1): Patient will attend to basic functional tasks for 10 minute intervals with Mod A verbal cues for re-direction SLP Short Term Goal 4 (Week 1): Patient will complete basic tasks with Mod A verbal cues for funcitonal problem solving  Skilled Therapeutic Interventions: Pt seen for skilled ST with focus on cognitive goals. When SLP entered room, pt leaning far to L reaching for Portal on night stand. Room adjusted so patient can operate and visualize Portal without risk of leaning/falling out of bed. Pt able to recall name of usual SLP and demonstrates increased awareness into rationale behind hospitalization and diagnosis. SLP facilitating simple problem solving with tray set up for AM meal by providing min A verbal cues. Pt son calling on Portal at this time and SLP providing Min A assist to answer phone call. Pt and son wanting to eat breakfast together, SLP will attempt to make up minutes as able. Pt left in bed with alarm set and all needs within reach. Cont ST POC.   Pain Pain Assessment Pain Scale: 0-10 Pain Score: 0-No pain  Therapy/Group: Individual Therapy  Dewaine Conger 04/24/2021, 8:35 AM

## 2021-04-24 NOTE — Plan of Care (Signed)
  Problem: RH BOWEL ELIMINATION Goal: RH STG MANAGE BOWEL WITH ASSISTANCE Description: STG Manage Bowel with Supervision Assistance. Outcome: Not Progressing; incontinence   Problem: RH BLADDER ELIMINATION Goal: RH STG MANAGE BLADDER WITH ASSISTANCE Description: STG Manage Bladder With Supervision Assistance Outcome: Not Progressing; incontinence

## 2021-04-25 LAB — BASIC METABOLIC PANEL
Anion gap: 7 (ref 5–15)
BUN: 13 mg/dL (ref 8–23)
CO2: 27 mmol/L (ref 22–32)
Calcium: 9.3 mg/dL (ref 8.9–10.3)
Chloride: 97 mmol/L — ABNORMAL LOW (ref 98–111)
Creatinine, Ser: 0.59 mg/dL — ABNORMAL LOW (ref 0.61–1.24)
GFR, Estimated: >60 mL/min (ref >60)
Glucose, Bld: 122 mg/dL — ABNORMAL HIGH (ref 70–99)
Potassium: 4.3 mmol/L (ref 3.5–5.1)
Sodium: 131 mmol/L — ABNORMAL LOW (ref 135–145)

## 2021-04-25 NOTE — Plan of Care (Signed)
  Problem: RH BLADDER ELIMINATION Goal: RH STG MANAGE BLADDER WITH ASSISTANCE Description: STG Manage Bladder With Supervision Assistance Outcome: Not Progressing; incontinence at times   Problem: RH SAFETY Goal: RH STG ADHERE TO SAFETY PRECAUTIONS W/ASSISTANCE/DEVICE Description: STG Adhere to Safety Precautions With cues and reminders. Outcome: Not Progressing; telesitter

## 2021-04-25 NOTE — Progress Notes (Signed)
Physical Therapy Session Note  Patient Details  Name: Shannon Horton. MRN: [id number] Date of Birth: Oct 19, 1946  Today's Date: 04/25/2021 PT Individual Time: 1300-1405 PT Individual Time Calculation (min): 65 min   Short Term Goals: Week 1:  PT Short Term Goal 1 (Week 1): Patient will perform basic transfers with mod A consistently. PT Short Term Goal 2 (Week 1): Patient will perform bed mobility with min A. PT Short Term Goal 3 (Week 1): Patient will perform sitting balance with mod A consistently. PT Short Term Goal 4 (Week 1): Patient will initiate gait training.  Skilled Therapeutic Interventions/Progress Updates:     Patient in bed with his wife and son in the room upon PT arrival. Patient alert and agreeable to PT session. Patient denied pain during session. Focused session on family education and incorporation in therapy session for improved participation on his Birthday.   Therapeutic Activity: Bed Mobility: Patient performed supine to sit with min A progressing to mod A for sitting balance due to L posterior lean. Provided verbal cues for initiation and max multimodal cues to reduce posterior bias in sitting. Provided visual target for patient's shoulder to therapist's on the R and progressed to hands on knees with supervision >1 min.  Transfers: Patient performed sit to/from stand x2 without AD with min/mod A, however patient quickly returned to sitting after each trial with mild posterior bias, improved with RW x2 with patient able to maintain standing and perform transfer with min A. Performed sit to stand x1 using L rail with min A as well. Provided mod verbal and tactile cues for hand placement and forward weight shift.  Gait Training:  Patient ambulated 18 feet using RW with min-mod A +2, second assist from patient's son for patient's comfort. He also ambulated 15 feet using L rail with his wife ahead of him as visual target/motivation to progress. Ambulated with crouched  gait, forward trunk flexion, and decreased L weight shift. Provided max multimodal cues for looking ahead at his wife, increased knee/hip extension L>R, and facilitation for L weight shift.  Neuromuscular Re-ed: Patient performed the following standing balance activities: -standing in front of a mirror with RW 2x1-2 min focused on midline orientation, visual feedback and multimodal cues provided for patient to self-correct posterior and L lean, L knee/hip extension, forward rotation of L pelvis, and sustaining symmetry in standing with min A for balance -standing with his wife with HHA attempting to sway to jazz music and progressed to stepping with mod A for support from therapist x3 min -standing at L rail progressing from min a to supervision >1 min with visual distraction from his wife in front of him to promote erect posture  Patient in TIS w/c with his family in the room ready to open birthday gifts at end of session with breaks locked, seat belt alarm set, and all needs within reach.   Therapy Documentation Precautions:  Precautions Precautions: Fall Precaution Comments: R crani Restrictions Weight Bearing Restrictions: No    Therapy/Group: Individual Therapy  Dajia Gunnels L Barbarita Hutmacher PT, DPT  04/25/2021, 4:32 PM

## 2021-04-25 NOTE — Progress Notes (Signed)
Physical Therapy Session Note  Patient Details  Name: Shannon Horton. MRN: [id number] Date of Birth: 02-Nov-1945  Today's Date: 04/25/2021 PT Individual Time: 1415-1510 PT Individual Time Calculation (min): 55 min   Short Term Goals: Week 1:  PT Short Term Goal 1 (Week 1): Patient will perform basic transfers with mod A consistently. PT Short Term Goal 2 (Week 1): Patient will perform bed mobility with min A. PT Short Term Goal 3 (Week 1): Patient will perform sitting balance with mod A consistently. PT Short Term Goal 4 (Week 1): Patient will initiate gait training.  Skilled Therapeutic Interventions/Progress Updates:    Pt received seated in TIS chair in room, agreeable to PT session. No complaints of pain. Dependent transport via [place] chair to/from therapy gym. Sit to stand in // bars with min A, max cueing for anterior lean with transfer. Use of mirror in standing for visual feedback for upright posture. Pt tends to lean laterally to the L and posteriorly. Pt requires max cueing to correct posterior lean in standing. Pt reports urgent urge to urinate. Returned to patient room, pt already incontinent of urine in brief and shorts. Pt unable to further void once urinal placed. Sit to stand with mod A to sink for dependent pericare and brief/clothing change with assist from a 2nd person. Pt returned to therapy gym. Sit to stand from long end of // bars with use of mirror for visual feedback. Pt exhibits trunk, hip, and knee flexion in standing, requires max cueing for upright posture but unable to maintain. Standing alt L/R 1" step-taps in standing, exhibits increase in whole body flexion with standing activity. Pt left seated in TIS chair in room with needs in reach, quick release belt and chair alarm in place, wife present, telesitter present at end of session.  Therapy Documentation Precautions:  Precautions Precautions: Fall Precaution Comments: R crani Restrictions Weight Bearing  Restrictions: No    Therapy/Group: Individual Therapy   Excell Seltzer, PT, DPT, CSRS  04/25/2021, 5:14 PM
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
## 2021-04-26 DIAGNOSIS — I10 Essential (primary) hypertension: Secondary | ICD-10-CM

## 2021-04-26 DIAGNOSIS — E440 Moderate protein-calorie malnutrition: Secondary | ICD-10-CM

## 2021-04-26 DIAGNOSIS — D496 Neoplasm of unspecified behavior of brain: Secondary | ICD-10-CM

## 2021-04-26 MED ORDER — METHYLPHENIDATE HCL 5 MG PO TABS
10.0000 mg | ORAL_TABLET | Freq: Two times a day (BID) | ORAL | Status: DC
  Administered 2021-04-26 – 2021-04-29 (×6): 10 mg via ORAL
  Filled 2021-04-26 (×6): qty 2

## 2021-04-26 MED ORDER — POTASSIUM CHLORIDE 20 MEQ PO PACK
20.0000 meq | PACK | Freq: Two times a day (BID) | ORAL | Status: DC
  Administered 2021-04-26 – 2021-05-01 (×10): 20 meq via ORAL
  Filled 2021-04-26 (×10): qty 1

## 2021-04-26 NOTE — Progress Notes (Signed)
PROGRESS NOTE   Subjective/Complaints: No problems over night. Appears to have slept. Slow to arouse this morning  ROS: Limited due to cognitive/behavioral   Objective:   No results found. No results for input(s): WBC, HGB, HCT, PLT in the last 72 hours.  Recent Labs    04/23/21 1134 04/25/21 0638  NA 134* 131*  K 3.0* 4.3  CL 95* 97*  CO2 30 27  GLUCOSE 81 122*  BUN 14 13  CREATININE 0.77 0.59*  CALCIUM 9.5 9.3    Intake/Output Summary (Last 24 hours) at 04/26/2021 1041 Last data filed at 04/26/2021 0840 Gross per 24 hour  Intake 238 ml  Output 875 ml  Net -637 ml        Physical Exam: Vital Signs Blood pressure 109/70, pulse 60, temperature (!) 97.5 F (36.4 C), resp. rate 20, height 5\' 8"  (1.727 m), weight 67.1 kg, SpO2 99 %. Constitutional: No distress . Vital signs reviewed. HEENT: EOMI, oral membranes moist Neck: supple Cardiovascular: RRR without murmur. No JVD    Respiratory/Chest: CTA Bilaterally without wheezes or rales. Normal effort    GI/Abdomen: BS +, non-tender, non-distended Ext: no clubbing, cyanosis, or edema Psych: pleasant and cooperative Skin: IV in right arm Neurological:    Comments: pt was more alert as our meeting progressed. Followed simple commands. Shannon Horton Shannon Horton was in hospital. Moves all 4's. 4/5 strength throughout. Speech clear when Shannon Horton phonates.   MSK: kyphotic posture. No pain.   Assessment/Plan: 1. Functional deficits which require 3+ hours per day of interdisciplinary therapy in a comprehensive inpatient rehab setting. Physiatrist is providing close team supervision and 24 hour management of active medical problems listed below. Physiatrist and rehab team continue to assess barriers to discharge/monitor patient progress toward functional and medical goals  Care Tool:  Bathing    Body parts bathed by patient: Right arm, Left arm, Chest, Abdomen, Right upper leg, Left upper  leg, Face   Body parts bathed by helper: Right lower leg, Left lower leg Body parts n/a: Buttocks, Front perineal area   Bathing assist Assist Level: Maximal Assistance - Patient 24 - 49% (for sitting balance seated at sink)     Upper Body Dressing/Undressing Upper body dressing   What is the patient wearing?: Pull over shirt    Upper body assist Assist Level: Minimal Assistance - Patient > 75%    Lower Body Dressing/Undressing Lower body dressing      What is the patient wearing?: Incontinence brief     Lower body assist Assist for lower body dressing: Maximal Assistance - Patient 25 - 49%     Toileting Toileting    Toileting assist Assist for toileting: Maximal Assistance - Patient 25 - 49% Assistive Device Comment: urinal   Transfers Chair/bed transfer  Transfers assist     Chair/bed transfer assist level: Moderate Assistance - Patient 50 - 74% Chair/bed transfer assistive device: Armrests   Locomotion Ambulation   Ambulation assist   Ambulation activity did not occur: Safety/medical concerns (patient unable to stand >5 sec with max A before LOB and total A to sit)  Assist level: 2 helpers Assistive device: Walker-rolling Max distance: 18 ft   Walk  10 feet activity   Assist  Walk 10 feet activity did not occur: Safety/medical concerns  Assist level: 2 helpers Assistive device: Walker-rolling   Walk 50 feet activity   Assist Walk 50 feet with 2 turns activity did not occur: Safety/medical concerns         Walk 150 feet activity   Assist Walk 150 feet activity did not occur: Safety/medical concerns         Walk 10 feet on uneven surface  activity   Assist Walk 10 feet on uneven surfaces activity did not occur: Safety/medical concerns         Wheelchair     Assist Will patient use wheelchair at discharge?: Yes (Per PT long term goals) Type of Wheelchair: Manual Wheelchair activity did not occur: Safety/medical concerns  (patient with significant forward lean and needed PT to block him in sitting for safety, used TIS w/c)         Wheelchair 50 feet with 2 turns activity    Assist    Wheelchair 50 feet with 2 turns activity did not occur: Safety/medical concerns       Wheelchair 150 feet activity     Assist  Wheelchair 150 feet activity did not occur: Safety/medical concerns       Blood pressure 109/70, pulse 60, temperature (!) 97.5 F (36.4 C), resp. rate 20, height 5\' 8"  (1.727 m), weight 67.1 kg, SpO2 99 %.    Medical Problem List and Plan: 1.  Left hemiparesis with visual deficits secondary to metastatic melanoma status postcraniotomy for resection of right frontal tumor 03/31/2021.  Pathology positive for malignant melanoma             -patient may shower             -ELOS/Goals: MinA 10-14 days  -Continue CIR therapies including PT, OT, and SLP  2.  Impaired mobility/s/p craniotomy: -DVT/anticoagulation: Continue Subcutaneous heparin 5000 units every 12 hours until cleared by NSGY. Please initiate heparin education to family.             -antiplatelet therapy: N/A 3. Pain Management: Tylenol as needed 4. Impaired attention: Continue Ritalin 5 mg twice daily--increase to 10mg                 -antipsychotic agents: N/A 5. Neuropsych: This patient is not capable of making decisions on his own behalf. 6. Skin/Wound Care: Routine skin checks 7. Fluids/Electrolytes/Nutrition: Routine in and outs with follow-up chemistries 8.  Seizure prophylaxis.  Continue Keppra 500 mg twice daily until 6/27---dc. 9.  Hypertension.  Norvasc 5 mg daily, Avapro 300 mg daily, HCTZ 25 mg daily.     -bp well controlled 10.  Hyponatremia.  Sodium chloride tablets 2 g twice daily.  Improving, repeat Monday 11.  History of glaucoma.  Followed by Dr. Satira Sark.  Continue eyedrops as directed. 12.  History of asthma.  Continue inhalers as directed 13. Bradycardia: continue to monitor TID, normalized. 60's 14.  Hypomagnesemia: magnesium normalized to 2.  15. Hypokalemia: K+ 4.3 on 6/26.  Continue supplementation  LOS: 5 days A FACE TO FACE EVALUATION WAS PERFORMED  [name] 04/26/2021, 10:41 AM

## 2021-04-26 NOTE — Progress Notes (Signed)
Physical Therapy Session Note  Patient Details  Name: Shannon Horton. MRN: [id number] Date of Birth: 09/06/1946  Today's Date: 04/26/2021 PT Individual Time: 1510-1535 PT Individual Time Calculation (min): 25 min   Short Term Goals: Week 1:  PT Short Term Goal 1 (Week 1): Patient will perform basic transfers with mod A consistently. PT Short Term Goal 2 (Week 1): Patient will perform bed mobility with min A. PT Short Term Goal 3 (Week 1): Patient will perform sitting balance with mod A consistently. PT Short Term Goal 4 (Week 1): Patient will initiate gait training.  Skilled Therapeutic Interventions/Progress Updates:     Patient in TIS w/c handed off from OT, reports patient incontinent of bladder, upon PT arrival. Patient alert and agreeable to PT session. Patient denied pain during session.  Patient verbose and perseverative of phrases throughout session, required mod cues to redirect to mobility.   Therapeutic Activity: Bed Mobility: Patient performed sit to supine with min A due to laying horizontally in the bed initially. Provided max multimodal cues for spatial awareness and initiation. Patient performed rolling in bed with min A and mod cues for sequencing to doff pants and change incontinence brief and perform peri-care with total A.  Transfers: Patient performed stand pivot TIS w/c>bed with mod A to facilitate turn with use of bed rail on L to promote forward weight shift and directional awareness. Provided mod multimodal cues for hand placement on rail and pivoting.  Patient in bed with Portal communication device to answer call from family set up in front of him at end of session with breaks locked, bed alarm set, and all needs within reach.   Therapy Documentation Precautions:  Precautions Precautions: Fall Precaution Comments: R crani Restrictions Weight Bearing Restrictions: No    Therapy/Group: Individual Therapy  Jessey Huyett L Maison Kestenbaum PT, DPT  04/26/2021,  5:11 PM

## 2021-04-26 NOTE — Progress Notes (Signed)
Speech Language Pathology Daily Session Note  Patient Details  Name: Shannon Horton. MRN: [id number] Date of Birth: 01/24/1946  Today's Date: 04/26/2021 SLP Individual Time: 3612-2449 SLP Individual Time Calculation (min): 40 min  Short Term Goals: Week 1: SLP Short Term Goal 1 (Week 1): Patient will consistently orient to place, time, and situation with Mod A verbal cues SLP Short Term Goal 2 (Week 1): Patient will utilize external memory aids to recall new, daily information with Min A verbal cues SLP Short Term Goal 3 (Week 1): Patient will attend to basic functional tasks for 10 minute intervals with Mod A verbal cues for re-direction SLP Short Term Goal 4 (Week 1): Patient will complete basic tasks with Mod A verbal cues for funcitonal problem solving  Skilled Therapeutic Interventions: Skilled treatment session focused on cognitive goals. Upon arrival, patient was watching his "portal device." Patient required extra time and Min verbal cues to navigate controls to turn off the portal and also to demonstrate use. Patient appeared confused throughout session and reporting that his nurse was actually his niece. SLP attempted to reorient patient without success. Despite confusion, patient recalled that he is having a visitor today and asking appropriate questions about his therapy schedule. Patient demonstrated improved attention to functional conversation today with an increase in processing speed noted. Patient left upright in bed with alarm on and all needs within reach. Continue with current plan of care.      Pain Pain Assessment Pain Scale: Faces Faces Pain Scale: Hurts little more Pain Type: Acute pain Pain Location: Head Pain Descriptors / Indicators: Headache Pain Frequency: Intermittent Pain Onset: On-going Pain Intervention(s): Medication (See eMAR)  Therapy/Group: Individual Therapy  Shannon Horton 04/26/2021, 3:44 PM

## 2021-04-26 NOTE — Progress Notes (Signed)
Occupational Therapy Session Note  Patient Details  Name: Shannon Horton. MRN: [id number] Date of Birth: May 02, 1946  Today's Date: 04/26/2021 OT Individual Time: 1420-1510 OT Individual Time Calculation (min): 50 min    Short Term Goals: Week 1:  OT Short Term Goal 1 (Week 1): pt will don shirt in supported sitting with S OT Short Term Goal 2 (Week 1): Pt will thread BLE into pants wiht no more than MIN A for sitting balance during supported sitting OT Short Term Goal 3 (Week 1): Pt will transfer to toilet wiht MOD A OT Short Term Goal 4 (Week 1): Pt will terminate oral care with no more than 2 VC  Skilled Therapeutic Interventions/Progress Updates:    Pt received supine, with wife, PA and MD friend present discussing case. Pt required mod cueing for bed mobility to EOB and mod A. Pt unaware of urinary incontinence. He required mod A to stand from EOB for max A brief change. Mod A to pull up pants in standing with posterior bias and B knee flexion. Cueing throughout session for glute activation and hip rotation to promote more upright posture. Pt completed stand pivot transfer to the TIS w/c with mod A. In the therapy gym he completed standing level mini squat activity to increase glute and BLE strength needed for increased independence with transfers and for upright posture. He required mod A overall in standing. Pt with slow processing throughout session. Pt returned to his room and reported need to urinate. He was quickly incontinent. He was passed off to PT and NT in room.   Therapy Documentation Precautions:  Precautions Precautions: Fall Precaution Comments: R crani Restrictions Weight Bearing Restrictions: No   Therapy/Group: Individual Therapy  Curtis Sites 04/26/2021, 6:34 AM

## 2021-04-27 DIAGNOSIS — R404 Transient alteration of awareness: Secondary | ICD-10-CM

## 2021-04-27 DIAGNOSIS — G9340 Encephalopathy, unspecified: Secondary | ICD-10-CM

## 2021-04-27 DIAGNOSIS — R4182 Altered mental status, unspecified: Secondary | ICD-10-CM

## 2021-04-27 DIAGNOSIS — R5383 Other fatigue: Secondary | ICD-10-CM

## 2021-04-27 DIAGNOSIS — Z87891 Personal history of nicotine dependence: Secondary | ICD-10-CM

## 2021-04-27 LAB — URINALYSIS, COMPLETE (UACMP) WITH MICROSCOPIC
Bacteria, UA: NONE SEEN
Bilirubin Urine: NEGATIVE
Glucose, UA: NEGATIVE mg/dL
Hgb urine dipstick: NEGATIVE
Ketones, ur: NEGATIVE mg/dL
Leukocytes,Ua: NEGATIVE
Nitrite: NEGATIVE
Protein, ur: NEGATIVE mg/dL
Specific Gravity, Urine: 1.013 (ref 1.005–1.030)
pH: 7 (ref 5.0–8.0)

## 2021-04-27 NOTE — Consult Note (Signed)
[facility] Consult Note  Patient Care Team: Laurey Morale, MD as PCP - General [place], Jennefer Bravo, MD as Consulting Physician (Dermatology) Marygrace Drought, MD as Consulting Physician (Ophthalmology) Harold Hedge, Darrick Grinder, MD as Consulting Physician (Allergy and Immunology) Elveria Rising, DDS (Dentistry)  CHIEF COMPLAINTS/PURPOSE OF CONSULTATION:  Altered Mental Status  HISTORY OF PRESENTING ILLNESS:  Shannon Horton. 75 y.o. male presented back to rehabilitation after course of re-admission in [facility] for hemorrhagic conversion melanoma metastases.  Providers and family are concerned that patient continues to be confused, drowsy, and participates poorly in rehab sessions.  It is difficult to say if these symptoms have accumulated over days or weeks, but there appears to be some degree of fluctuation.  It seems he was more clear and cogent immediately following surgery, but this was prior to known hemorrhagic conversion.  Patient has no explicit complaints at bedside, most of history provided by family.  MEDICAL HISTORY:  Past Medical History:  Diagnosis Date   Allergic rhinitis    gets shots per Dr. Velora Heckler   Allergy    sees Dr. Harold Hedge    Asthma    Glaucoma    sees Dr. Satira Sark    Hypertension    Malignant melanoma [facility])    right lower leg, diagnosed on1/21/11    SURGICAL HISTORY: Past Surgical History:  Procedure Laterality Date   APPLICATION OF CRANIAL NAVIGATION N/A 03/31/2021   Procedure: APPLICATION OF CRANIAL NAVIGATION;  Surgeon: Karsten Ro, DO;  Location: [facility];  Service: Neurosurgery;  Laterality: N/A;   CATARACT EXTRACTION     right ey, Dr. Satira Sark    COLONOSCOPY  12/15/2015   per Dr.Kaplan, clear, no repeats    CRANIOTOMY N/A 03/31/2021   Procedure: RIGHT FRONTAL CRANIOTOMY FOR TUMOR;  Surgeon: Karsten Ro, DO;  Location: [place];  Service: Neurosurgery;  Laterality: N/A;   ESOPHAGOGASTRODUODENOSCOPY     WITH ESOPHAGEAL  DILATION 2006 PER dR. kAPLAN   REMOVAL  of melanoma  12/08/09   from right calf; per Dr. Monica Becton   removal of melanoma from left abdomen  01/21/10    per Dr. Denice Bors CELL CARCINOMA EXCISION  2013   per Dr. Terrence Dupont, from top of scalp     SOCIAL HISTORY: Social History   Socioeconomic History   Marital status: Married    Spouse name: Not on file   Number of children: Not on file   Years of education: Not on file   Highest education level: Not on file  Occupational History   Not on file  Tobacco Use   Smoking status: Former    Pack years: 0.00    Types: Cigarettes    Quit date: 1992    Years since quitting: 30.5   Smokeless tobacco: Never   Tobacco comments:    quit 30 years ago  Vaping Use   Vaping Use: Never used  Substance and Sexual Activity   Alcohol use: Yes    Alcohol/week: 5.0 - 10.0 standard drinks    Types: 5 - 10 Standard drinks or equivalent per week    Comment: 1-2 beers/day   Drug use: No   Sexual activity: Not Currently  Other Topics Concern   Not on file  Social History Narrative   Right handed   One story home   Drinks caffeinew   Social Determinants of Health   Financial Resource Strain: Low Risk    Difficulty of Paying Living Expenses:  Not hard at all  Food Insecurity: No Food Insecurity   Worried About Charity fundraiser in the Last Year: Never true   Ran Out of Food in the Last Year: Never true  Transportation Needs: No Transportation Needs   Lack of Transportation (Medical): No   Lack of Transportation (Non-Medical): No  Physical Activity: Sufficiently Active   Days of Exercise per Week: 4 days   Minutes of Exercise per Session: 40 min  Stress: No Stress Concern Present   Feeling of Stress : Not at all  Social Connections: Moderately Integrated   Frequency of Communication with Friends and Family: Three times a week   Frequency of Social Gatherings with Friends and Family: More than three times a week   Attends  Religious Services: Never   Marine scientist or Organizations: Yes   Attends Archivist Meetings: Never   Marital Status: Married  Human resources officer Violence: Not At Risk   Fear of Current or Ex-Partner: No   Emotionally Abused: No   Physically Abused: No   Sexually Abused: No    FAMILY HISTORY: Family History  Problem Relation Age of Onset   Hypertension Other    Colon polyps Mother    Hypertension Mother    Diabetes Father    Liver disease Maternal Uncle    Cancer Brother        throat and lung (smoker)   Colon cancer Neg Hx    Stomach cancer Neg Hx    Esophageal cancer Neg Hx    Pancreatic cancer Neg Hx     ALLERGIES:  is allergic to banana.  MEDICATIONS:  Current Facility-Administered Medications  Medication Dose Route Frequency Provider Last Rate Last Admin   acetaminophen (TYLENOL) tablet 650 mg  650 mg Oral Q4H PRN Cathlyn Parsons, PA-C   650 mg at 04/26/21 1404   albuterol (PROVENTIL) (2.5 MG/3ML) 0.083% nebulizer solution 2.5 mg  2.5 mg Nebulization Q4H PRN [name], MD       amLODipine (NORVASC) tablet 5 mg  5 mg Oral Daily Cathlyn Parsons, PA-C   5 mg at 04/27/21 0759   brinzolamide (AZOPT) 1 % ophthalmic suspension 1 drop  1 drop Both Eyes BID [name] T, MD   1 drop at 04/27/21 0759   budesonide (PULMICORT) nebulizer solution 0.25 mg  0.25 mg Nebulization BID Cathlyn Parsons, PA-C   0.25 mg at 04/27/21 0725   dexamethasone (DECADRON) tablet 2 mg  2 mg Oral Q12H AngiulliLavon Paganini, PA-C   2 mg at 04/27/21 0757   docusate sodium (COLACE) capsule 100 mg  100 mg Oral BID PRN Cathlyn Parsons, PA-C       feeding supplement (ENSURE ENLIVE / ENSURE PLUS) liquid 237 mL  237 mL Oral TID BM [name], MD   237 mL at 04/26/21 2012   heparin injection 5,000 Units  5,000 Units Subcutaneous BID Cathlyn Parsons, PA-C   5,000 Units at 04/27/21 0757   irbesartan (AVAPRO) tablet 300 mg  300 mg Oral Daily Cathlyn Parsons, PA-C    300 mg at 04/27/21 0757   latanoprost (XALATAN) 0.005 % ophthalmic solution 1 drop  1 drop Both Eyes QHS AngiulliLavon Paganini, PA-C   1 drop at 04/26/21 2104   methylphenidate (RITALIN) tablet 10 mg  10 mg Oral BID WC [name], MD   10 mg at 04/27/21 1141   multivitamin with minerals tablet 1 tablet  1  tablet Oral Daily [name], MD   1 tablet at 04/27/21 0757   pantoprazole (PROTONIX) EC tablet 40 mg  40 mg Oral Daily Cathlyn Parsons, PA-C   40 mg at 04/26/21 2016   polyethylene glycol (MIRALAX / GLYCOLAX) packet 17 g  17 g Oral Daily PRN Angiulli, Lavon Paganini, PA-C       potassium chloride (KLOR-CON) packet 20 mEq  20 mEq Oral BID [name] T, MD   20 mEq at 04/27/21 0757   sodium chloride tablet 2 g  2 g Oral BID WC AngiulliLavon Paganini, PA-C   2 g at 04/27/21 0757    REVIEW OF SYSTEMS:   Limited by mentation  PHYSICAL EXAMINATION: Vitals:   04/27/21 0727 04/27/21 1431  BP:  97/68  Pulse:  68  Resp:  18  Temp:  97.8 F (36.6 C)  SpO2: 97% 94%   KPS: 50. General: Sleepy Head: Craniotomy scar noted, dry and intact. EENT: No conjunctival injection or scleral icterus. Oral mucosa moist Lungs: Resp effort normal Cardiac: Regular rate and rhythm Abdomen: Soft, non-distended abdomen Skin: No rashes cyanosis or petechiae. Extremities: No clubbing or edema  NEUROLOGIC EXAM: Mental Status: Drowsy, arouses to voice stim.  Doesn't maintain alertness without some stimulation.  Oriented to self and environment.  Fluency is grossly intact, able to follow simple commands. Fails with two step commands or higher order cognitive processing. Cranial Nerves: Visual acuity is grossly normal. Visual fields are full. Extra-ocular movements intact. No ptosis. Face is symmetric, tongue midline. Motor: Tone and bulk are normal. Arms and legs antigravity.  Noted coarse asterixis and poly-mini-myclonus on fixed posturing. Reflexes are symmetric, no pathologic reflexes  present. Sensory: Intact grossly Gait: Deferred   LABORATORY DATA:  I have reviewed the data as listed Lab Results  Component Value Date   WBC 4.5 04/22/2021   HGB 14.4 04/22/2021   HCT 39.9 04/22/2021   MCV 88.9 04/22/2021   PLT 231 04/22/2021   Recent Labs    09/10/20 1045 02/17/21 1108 03/25/21 1003 04/07/21 0544 04/09/21 0529 04/18/21 2332 04/20/21 0224 04/22/21 0433 04/23/21 1134 04/25/21 0638  NA 136 135   < > 131*   < > 128*   < > 133* 134* 131*  K 4.5 3.8   < > 4.1   < > 3.0*   < > 3.5 3.0* 4.3  CL 98 95*   < > 95*   < > 95*   < > 95* 95* 97*  CO2 29 30   < > 28   < > 26   < > 29 30 27   GLUCOSE 107* 104*   < > 117*   < > 99   < > 148* 81 122*  BUN 11 8   < > 17   < > 16   < > 10 14 13   CREATININE 0.82 0.78   < > 0.74   < > 0.63   < > 0.80 0.77 0.59*  CALCIUM 10.1 9.8   < > 8.8*   < > 8.4*   < > 9.6 9.5 9.3  GFRNONAA 87  --    < > >60   < > >60   < > >60 >60 >60  GFRAA 101  --   --   --   --   --   --   --   --   --   PROT 6.6 6.5   < > 4.8*  --  4.6*  --  5.6*  --   --   ALBUMIN  --  4.2   < > 2.8*  --  2.6*  --  3.3*  --   --   AST 22 17   < > 16  --  20  --  18  --   --   ALT 16 13   < > 27  --  38  --  33  --   --   ALKPHOS  --  77   < > 57  --  53  --  66  --   --   BILITOT 0.8 0.8   < > 0.9  --  1.1  --  1.1  --   --   BILIDIR 0.1 0.2  --   --   --   --   --   --   --   --   IBILI 0.7  --   --   --   --   --   --   --   --   --    < > = values in this interval not displayed.    RADIOGRAPHIC STUDIES: I have personally reviewed the radiological images as listed and agreed with the findings in the report. DG Chest 2 View  Result Date: 04/17/2021 CLINICAL DATA:  Cough EXAM: CHEST - 2 VIEW COMPARISON:  04/10/2021, CT from 03/25/2021 FINDINGS: Cardiac shadow is stable. Mild tortuous thoracic aorta is again seen. Bibasilar airspace opacity is noted slightly improved when compared with the prior exam. Fullness of the right hilum is noted related to vessel on  end. Stable right upper lobe nodule is noted seen on previous CT. The known left upper lobe nodule is not as well appreciated on this exam as on prior CT. No sizable effusion is seen. No acute bony abnormality is noted. IMPRESSION: Improving bibasilar airspace opacity. Stable right upper lobe nodule unchanged from prior CT. The known left upper lobe sub solid nodule is not as well appreciated on this exam. Electronically Signed   By: Inez Catalina M.D.   On: 04/17/2021 15:33   DG Chest 2 View  Result Date: 04/10/2021 CLINICAL DATA:  Follow-up of pneumonia EXAM: CHEST - 2 VIEW COMPARISON:  1 day prior FINDINGS: Osteopenia. Hyperinflation. The Chin overlies the apices. Midline trachea. Normal heart size. Atherosclerosis in the transverse aorta. No pleural effusion or pneumothorax. Left greater than right base airspace disease, slightly increased. 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 IMPRESSION: Slightly increased left greater than right base airspace disease, suspicious for infection or aspiration. Underlying hyperinflation, suggesting COPD. Aortic Atherosclerosis (ICD10-I70.0). Electronically Signed   By: Abigail Miyamoto M.D.   On: 04/10/2021 08:34   DG Chest 2 View  Result Date: 04/09/2021 CLINICAL DATA:  Shortness of breath EXAM: CHEST - 2 VIEW COMPARISON:  Mar 25, 2021 chest radiograph and chest CT FINDINGS: 1 cm nodular opacity in the left upper lobe is again noted and unchanged. Airspace opacity consistent with pneumonia in the lung bases is present. The heart size and pulmonary vascularity are normal. No adenopathy appreciable. There is aortic atherosclerosis. Sclerotic focus noted in the left proximal humerus, stable. IMPRESSION: 1.  Bibasilar airspace opacity consistent with bibasilar pneumonia. 2. 1 cm nodular opacity right upper lobe, unchanged. Neoplastic focus of concern. 3.  Heart size normal. 4.  Aortic Atherosclerosis (ICD10-I70.0). 5.  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 Electronically Signed   By: Lowella Grip III  M.D.   On: 04/09/2021 12:59   CT HEAD WO CONTRAST  Addendum Date: 04/18/2021   ADDENDUM REPORT: 04/18/2021 11:50 ADDENDUM: Study discussed by telephone with Dr. Emelda Brothers on 04/18/2021 at 1139 hours. Electronically Signed   By: Genevie Ann M.D.   On: 04/18/2021 11:50   Result Date: 04/18/2021 CLINICAL DATA:  75 year old male with unexplained altered mental status. Right basal ganglia tumor resection earlier this month. Pathology revealing metastatic melanoma. EXAM: CT HEAD WITHOUT CONTRAST TECHNIQUE: Contiguous axial images were obtained from the base of the skull through the vertex without intravenous contrast. COMPARISON:  Postoperative head CT 04/09/2021 and earlier. FINDINGS: Brain: There is a small new volume of intraventricular hemorrhage layering within the left occipital horn on series 3, image 18. Right ventricle size appears slightly larger than before, but this might be related to decreased right hemisphere edema. No definite ventriculomegaly or transependymal edema. But no other acute intracranial hemorrhage is identified. Furthermore, MRI appearance on 04/01/2021 was suspicious for a small left choroid a plexus metastasis (same image as above). Stable CT appearance of postoperative changes in the right hemisphere tracking to the right lentiform. Regressed edema since 04/01/2021. No new intracranial mass or mass effect is evident. Normal basilar cisterns. No cortically based acute infarct identified. Vascular: Mild Calcified atherosclerosis at the skull base. No suspicious intracranial vascular hyperdensity. Skull: Stable right frontotemporal craniotomy. No acute osseous abnormality identified. Sinuses/Orbits: Visualized paranasal sinuses and mastoids are clear. Other: Regressed scalp postoperative changes. Orbits soft tissues remain negative. A small volume of right retro maxillary soft tissue gas has decreased and may be postoperative.  IMPRESSION: 1. Positive for a small volume of acute left occipital horn IVH likely secondary to the small left choroid plexus metastasis suspected by MRI. No definite acute ventriculomegaly or transependymal edema. 2. Satisfactory noncontrast CT postoperative appearance of the right lentiform resection cavity. Electronically Signed: By: Genevie Ann M.D. On: 04/18/2021 11:28   CT HEAD WO CONTRAST  Result Date: 04/09/2021 CLINICAL DATA:  Altered mental status. EXAM: CT HEAD WITHOUT CONTRAST TECHNIQUE: Contiguous axial images were obtained from the base of the skull through the vertex without intravenous contrast. COMPARISON:  Head CT scan 03/25/2021.  Brain MRI 03/26/2021. FINDINGS: Brain: The patient has undergone right frontal craniotomy for resection of a right frontal lobe mass since the prior exams. There is a small amount of pneumocephalus and a tiny amount of hemorrhage at the surgical site. No hydrocephalus, midline shift or evidence of acute infarct. Vascular: No hyperdense vessel or unexpected calcification. Skull: No acute abnormality.  Right craniotomy defect noted. Sinuses/Orbits: Negative. Other: None. IMPRESSION: Status post resection of a right frontal lobe mass. Small amount of pneumocephalus and a tiny amount of hemorrhage at the surgical site are consistent with postoperative change. No acute abnormality is identified. Electronically Signed   By: Inge Rise M.D.   On: 04/09/2021 12:42   MR BRAIN W WO CONTRAST  Result Date: 04/19/2021 CLINICAL DATA:  Mental status change. EXAM: MRI HEAD WITHOUT AND WITH CONTRAST TECHNIQUE: Multiplanar, multiecho pulse sequences of the brain and surrounding structures were obtained without and with intravenous contrast. CONTRAST:  84mL GADAVIST GADOBUTROL 1 MMOL/ML IV SOLN COMPARISON:  CT head April 18, 2021.  MRI head April 01, 2021. FINDINGS: Brain: Postoperative changes of right frontal craniotomy and mass resection. Peripheral enhancement and restricted  diffusion along the resection cavity likely represents devitalized tissue. Decreased surrounding edema and regional mass effect with decreased conspicuity of blood products and developing encephalomalacia. Resolution  of previously seen midline shift. Mild increase in ventricular size relative to prior MRI. As seen on recent CT head, acute left occipital horn intraventricular hemorrhage. Enhancing left choroid plexus lesion, which appears slightly increased in size (approximately 8 mm on series 20, image 17, previously 6 mm when remeasured) with more homogeneous enhancement. Additional more focal area of enhancement and restricted diffusion in the more superomedial corona radiata/caudate correlates with previously seen acute infarct in is compatible with evolving/subacute infarct. This region of enhancement partially obscures the previously seen right caudate enhancing lesion. Additional mild to moderate scattered T2/FLAIR hyperintensities within the white matter, likely related to chronic microvascular ischemic disease. Vascular: Major arterial flow voids are maintained at the skull base. Skull and upper cervical spine: Normal marrow signal. Sinuses/Orbits: Largely clear sinuses.  Unremarkable orbits. Other: No mastoid effusions. IMPRESSION: 1. Acute left occipital horn intraventricular hemorrhage, similar to recent CT head and increased from prior MRI. 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 2. Mild increase in ventricular size relative to prior MRI, which is likely in part related to atrophy and decreasing mass effect from the resected right frontal lobe mass. In the setting of intraventricular hemorrhage, recommend follow-up CT to ensure stability and exclude developing hydrocephalus. 3. Evolving changes of right  basal ganglia tumor resection with decreased surrounding edema and mass effect. Resolution of midline shift. Irregular enhancement along the periphery of the resection cavity may represent evolving postoperative changes but warrants attention on follow-up. 4. Increased enhancement in the region of recently seen infarct superomedial to the resection cavity, most likely enhancing subacute infarct given findings on the prior MRI. This limits assessment for progressive tumor in this region and partially obscures the right caudate lesion. Recommend attention on short interval follow-up to ensure resolution and to better assess. Electronically Signed   By: Margaretha Sheffield MD   On: 04/19/2021 08:09   MR BRAIN W WO CONTRAST  Result Date: 04/01/2021 CLINICAL DATA:  Postop day 1 status post resection of a right basal ganglia region mass. Recent adrenal biopsy demonstrating metastatic melanoma. EXAM: MRI HEAD WITHOUT AND WITH CONTRAST TECHNIQUE: Multiplanar, multiecho pulse sequences of the brain and surrounding structures were obtained without and with intravenous contrast. CONTRAST:  64mL GADAVIST GADOBUTROL 1 MMOL/ML IV SOLN COMPARISON:  03/26/2021 FINDINGS: Brain: Sequelae of interval right frontal craniotomy and mass resection are identified. There is a resection cavity in the right basal ganglia containing blood products with surgical tract extending through the anterolateral frontal lobe. There is a small amount of partially nodular intrinsic T1 hyperintensity along the margins of the resection cavity suggesting blood products. Faint enhancement is also present along the periphery of the resection cavity. There is a 1.8 cm region of intensely restricted diffusion immediately superior to the resection cavity involving portions of the right corona radiata and caudate nucleus compatible with an acute infarct. Small volume pneumocephalus is noted, and there is a small extra-axial fluid collection over the right frontal  convexity subjacent to the craniotomy measuring 7 mm in thickness. There is moderate residual edema throughout the right basal ganglia region extending into the frontal greater than temporal lobes which has decreased from the prior study. 5 mm of leftward midline shift is unchanged. A 5 mm lesion in the right caudate head demonstrating both intrinsic T1 hyperintensity and enhancement is unchanged (series  10, image 33). The smaller enhancing focus in the right caudate nucleus slightly more inferiorly on the prior MRI is less conspicuous on today's study. There is 6 mm heterogeneously enhancing nodular focus in the choroid plexus in the left lateral ventricle at the level of the inferior atrium/posterior temporal horn (series 10, image 26) which is asymmetric compared to the contralateral side. This is unchanged in size from the prior MRI, however there is new susceptibility artifact associated with this lesion consistent with hemorrhage, and there is new trace intraventricular hemorrhage layering in the occipital horn of the left lateral ventricle. Scattered small T2 hyperintensities in the cerebral white matter bilaterally are nonspecific but compatible with mild chronic small vessel ischemic disease. There is mild cerebral atrophy. Vascular: Major intracranial vascular flow voids are preserved. Skull and upper cervical spine: Right frontal craniotomy with gas and fluid in the scalp. Sinuses/Orbits: Bilateral cataract extraction. Paranasal sinuses and mastoid air cells are clear. Other: None. IMPRESSION: 1. Postoperative changes from interval right basal ganglia tumor resection as above. This will serve as a baseline for future examinations. 2. 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 3. Decreased edema in the right basal ganglia region. Unchanged 5 mm a midline shift. 4. Unchanged 5 mm right caudate lesion. 5. 6 mm nodular hemorrhagic lesion in the choroid plexus of the left lateral ventricle  suspicious for a metastasis with trace intraventricular hemorrhage. Electronically Signed   By: Logan Bores M.D.   On: 04/01/2021 15:09   CT BIOPSY  Result Date: 03/30/2021 INDICATION: 75 year old gentleman with history of melanoma presents to interventional radiology for biopsy of right adrenal mass. EXAM: CT-guided biopsy of right adrenal mass MEDICATIONS: None. ANESTHESIA/SEDATION: Moderate (conscious) sedation was employed during this procedure. A total of Versed 1 mg and Fentanyl 25 mcg was administered intravenously. Moderate Sedation Time: 17 minutes. The patient's level of consciousness and vital signs were monitored continuously by radiology nursing throughout the procedure under my direct supervision. COMPLICATIONS: SIR Level A - No therapy, no consequence. PROCEDURE: Informed written consent was obtained from the patient after a thorough discussion of the procedural risks, benefits and alternatives. All questions were addressed. Maximal Sterile Barrier Technique was utilized including caps, mask, sterile gowns, sterile gloves, sterile drape, hand hygiene and skin antiseptic. A timeout was performed prior to the initiation of the procedure. Patient position right lateral decubitus on the CT table. Following local lidocaine administration, 17 gauge introducer needle was advanced into the right adrenal mass utilizing CT guidance. 4-18 gauge cores were obtained. Samples were sent to pathology in formalin. Postprocedure CT demonstrated mild retroperitoneal hematoma which did not significantly increased in size on sequential CT. No pneumothorax identified on postprocedure CT. IMPRESSION: CT-guided biopsy of right adrenal mass. Electronically Signed   By: Miachel Roux M.D.   On: 03/30/2021 12:56    ASSESSMENT & PLAN:  Encephalopathy  Shannon Horton. demonstrates clinical syndrome localizing broadly to the cerebral cortex.  His deficits, in particular arousal level and asterixis, are less focal  than suggestive of diffuse (metabolic or structural) encephalopathy.    He has been worked up serologically (TSH, ammonia, etc) and does not have clinical sign of infection.  There is no suggestion of overt or occult seizure activity anywhere in the history.  Etiology is likely multifactorial, secondary to multifocal blood products seen on recent imaging, craniotomy effects, inflammatory state from advanced systemic disease, and possibly under-treatment of CNS inflammation or adrenal axis suppression.  We recommended a trial of  pulse dose dexamethasone, 12mg  now and 8mg  BID x3 days.  We are hopeful this can increase his participation with rehab and push him in the right direction.  If not helpful, consideration can be given for 48hrs of long term EEG monitoring to definitively rule out occult status epilepticus, though I think the yield for this approach would be low.   We will continue to follow up with the rehab team and manage expectantly for changes in clinical course.  Assuming improvement in mentation, cognitive status, he would be candidate for post-operative SRS and systemic therapy for melanoma.  All questions were answered. The patient knows to call the clinic with any problems, questions or concerns.  The total time spent in the encounter was 55 minutes and more than 50% was on counseling and review of test results     Ventura Sellers, MD 04/27/2021 3:39 PM

## 2021-04-27 NOTE — Progress Notes (Addendum)
PROGRESS NOTE   Subjective/Complaints: No issues overnight. Working on breakfast with SLP. Very slow to initiate feeding. Distracted. Needs continued cueing  ROS: Limited due to cognitive/behavioral   Objective:   No results found. No results for input(s): WBC, HGB, HCT, PLT in the last 72 hours.  Recent Labs    04/25/21 0638  NA 131*  K 4.3  CL 97*  CO2 27  GLUCOSE 122*  BUN 13  CREATININE 0.59*  CALCIUM 9.3    Intake/Output Summary (Last 24 hours) at 04/27/2021 1120 Last data filed at 04/27/2021 0930 Gross per 24 hour  Intake 477 ml  Output 780 ml  Net -303 ml        Physical Exam: Vital Signs Blood pressure 119/78, pulse (!) 56, temperature 98 F (36.7 C), resp. rate 17, height 5\' 8"  (1.727 m), weight 67.1 kg, SpO2 97 %. Constitutional: No distress . Vital signs reviewed. HEENT: EOMI, oral membranes moist Neck: supple Cardiovascular: RRR without murmur. No JVD    Respiratory/Chest: CTA Bilaterally without wheezes or rales. Normal effort    GI/Abdomen: BS +, non-tender, non-distended Ext: no clubbing, cyanosis, or edema Psych: pleasant and cooperative Skin: scattered bruising, NSL Neurological:    Comments: alert, delayed, distracted also. Does follow simple commands with extra time.  Moves all 4's. 4- to 4/5 strength throughout. Speech clearer when he phonates.   MSK: kyphotic posture. No pain.   Assessment/Plan: 1. Functional deficits which require 3+ hours per day of interdisciplinary therapy in a comprehensive inpatient rehab setting. Physiatrist is providing close team supervision and 24 hour management of active medical problems listed below. Physiatrist and rehab team continue to assess barriers to discharge/monitor patient progress toward functional and medical goals  Care Tool:  Bathing    Body parts bathed by patient: Right arm, Left arm, Chest, Abdomen, Right upper leg, Left upper leg,  Face   Body parts bathed by helper: Right lower leg, Left lower leg Body parts n/a: Buttocks, Front perineal area   Bathing assist Assist Level: Maximal Assistance - Patient 24 - 49% (for sitting balance seated at sink)     Upper Body Dressing/Undressing Upper body dressing   What is the patient wearing?: Pull over shirt    Upper body assist Assist Level: Minimal Assistance - Patient > 75%    Lower Body Dressing/Undressing Lower body dressing      What is the patient wearing?: Incontinence brief     Lower body assist Assist for lower body dressing: Maximal Assistance - Patient 25 - 49%     Toileting Toileting    Toileting assist Assist for toileting: Maximal Assistance - Patient 25 - 49% Assistive Device Comment: urinal   Transfers Chair/bed transfer  Transfers assist     Chair/bed transfer assist level: Moderate Assistance - Patient 50 - 74% Chair/bed transfer assistive device: Armrests   Locomotion Ambulation   Ambulation assist   Ambulation activity did not occur: Safety/medical concerns (patient unable to stand >5 sec with max A before LOB and total A to sit)  Assist level: 2 helpers Assistive device: Walker-rolling Max distance: 18 ft   Walk 10 feet activity   Assist  Walk 10  feet activity did not occur: Safety/medical concerns  Assist level: 2 helpers Assistive device: Walker-rolling   Walk 50 feet activity   Assist Walk 50 feet with 2 turns activity did not occur: Safety/medical concerns         Walk 150 feet activity   Assist Walk 150 feet activity did not occur: Safety/medical concerns         Walk 10 feet on uneven surface  activity   Assist Walk 10 feet on uneven surfaces activity did not occur: Safety/medical concerns         Wheelchair     Assist Will patient use wheelchair at discharge?: Yes (Per PT long term goals) Type of Wheelchair: Manual Wheelchair activity did not occur: Safety/medical concerns (patient  with significant forward lean and needed PT to block him in sitting for safety, used TIS w/c)         Wheelchair 50 feet with 2 turns activity    Assist    Wheelchair 50 feet with 2 turns activity did not occur: Safety/medical concerns       Wheelchair 150 feet activity     Assist  Wheelchair 150 feet activity did not occur: Safety/medical concerns       Blood pressure 119/78, pulse (!) 56, temperature 98 F (36.7 C), resp. rate 17, height 5\' 8"  (1.727 m), weight 67.1 kg, SpO2 97 %.    Medical Problem List and Plan: 1.  Left hemiparesis with visual deficits secondary to metastatic melanoma status postcraniotomy for resection of right frontal tumor 03/31/2021.  Pathology positive for malignant melanoma             -patient may shower             -ELOS/Goals: MinA 10-14 days  -team conf today. Pt with slow neurological progress  -Dr. Mickeal Skinner to see while on inpatient rehab to discuss plan with family. 2.  Impaired mobility/s/p craniotomy: -DVT/anticoagulation: Continue Subcutaneous heparin 5000 units every 12 hours until cleared by NSGY. Please initiate heparin education to family.             -antiplatelet therapy: N/A 3. Pain Management: Tylenol as needed 4. Impaired attention/initiation:   Ritalin-increased to 10mg  bid  -recheck labs tomorrow, check ua also today             -antipsychotic agents: N/A 5. Neuropsych: This patient is not capable of making decisions on his own behalf. 6. Skin/Wound Care: Routine skin checks 7. Fluids/Electrolytes/Nutrition: Routine in and outs with follow-up chemistries 8.  Seizure prophylaxis.  keppra completed. 9.  Hypertension.  Norvasc 5 mg daily, Avapro 300 mg daily, HCTZ 25 mg daily.     -bp well controlled 10.  Hyponatremia.  Continued mild to borderline hyponatremia -continue Sodium chloride tablets 2 g twice daily.   -1500 cc FR -hold HCTZ -recheck bmet in am  11.  History of glaucoma.  Followed by Dr. Satira Sark.  Continue  eyedrops as directed. 12.  History of asthma.  Continue inhalers as directed 13. Bradycardia: continue to monitor TID, normalized. 60's 14. Hypomagnesemia: magnesium normalized to 2.  15. Hypokalemia: K+ 4.3 on 6/26.  Continue supplementation  LOS: 6 days A FACE TO FACE EVALUATION WAS PERFORMED  [name] 04/27/2021, 11:20 AM

## 2021-04-27 NOTE — Patient Care Conference (Signed)
Inpatient RehabilitationTeam Conference and Plan of Care Update Date: 04/27/2021   Time: 10:07 AM    Patient Name: Shannon Horton.      Medical Record Number: [id number]  Date of Birth: 1946/05/07 Sex: Male         Room/Bed: 4M05C/4M05C-01 Payor Info: Payor: HU[employer] MEDICARE / Plan: [employer] MEDICARE CHOICE PPO / Product Type: *No Product type* /    Admit Date/Time:  04/21/2021  2:55 PM  Primary Diagnosis:  Malignant frontal lobe tumor [facility])  Hospital Problems: Principal Problem:   Malignant frontal lobe tumor ([place]) Active Problems:   Malignant melanoma ([place])   Malnutrition of moderate degree    Expected Discharge Date: Expected Discharge Date:  (2-2.5 weeks)  Team Members Present: Physician leading conference: Dr. [name] Care Coodinator Present: Loralee Pacas, LCSWA;Gareld Obrecht Creig Hines, RN, BSN, CRRN Nurse Present: Dorthula Nettles, RN PT Present: Apolinar Junes, PT OT Present: Laverle Hobby, OT SLP Present: Weston Anna, SLP PPS Coordinator present : Gunnar Fusi, SLP     Current Status/Progress Goal Weekly Team Focus  Bowel/Bladder   Pt is currently cont/incont of B/B. LBM 04/24/21.  Pt to gain cont  toilet pt q2h and  PRN   Swallow/Nutrition/ Hydration             ADL's   Mod-max A ADLs overall, more confused, poor sitting balance and standing balance with forward flexed posture  min A overall  Cognitive retraining, ADLs, transfers, functional activity tolerance, d/c planning/ goals of care   Mobility   Mod A overall, gait 25 ft with RW limited by attention and motor planning deficits  Min A overall, gait 25 ft  orientation, safety awareness, motor planning, activity tolerance, balance, functional mobility, gait training, posture (significant trunk flexion-worse with new admission), patient/caregiver education   Communication             Safety/Cognition/ Behavioral Observations  mod-to-max  Min A (may need to modify goals to mod)  orientation,  attention, memory, functional problem solving   Pain   Pt states no pain  Pt remains pain free  Assess for pain qshift and provide PRN  medication per orders as needed   Skin   Pt skin is clean and intact with brusing on abd and arms  Pt skin to remain clean and free of breakdown.  Assess skin each shift and provide skin care     Discharge Planning:  Discharging home with spouse to provide 24/7 assistance, Sons to provide PRN assistance   Team Discussion: MD going to push Ritalin. Incontinent B/B. No complaints of pain, has bruising to skin. Discharging home with wife.  Patient on target to meet rehab goals: More confused. Mod assist upper body dressing, max assist lower body dressing. Requires a lot of assist. Mod assist for transfers, walking bent over, not safe or functional. Min assist goals. SLP max assist for attention. Doesn't buy into redirection. Max assist, very slow going.  *See Care Plan and progress notes for long and short-term goals.   Revisions to Treatment Plan:  MD making medication adjustments.  Teaching Needs: Family education, medication management, skin/wound care, transfer training, gait training, balance training, endurance training, safety awareness.  Current Barriers to Discharge: Decreased caregiver support, Medical stability, Home enviroment access/layout, Incontinence, Lack of/limited family support, Medication compliance, Behavior, and Nutritional means  Possible Resolutions to Barriers: Continue current medications, provide emotional support.     Medical Summary Current Status: still slow to process. po intake borderline. ritalin trial. bp controlled  Barriers to Discharge: Medical stability   Possible Resolutions to Barriers/Weekly Focus: daily assessment of nutritional status and pt data, stimulant trial   Continued Need for Acute Rehabilitation Level of Care: The patient requires daily medical management by a physician with specialized training in  physical medicine and rehabilitation for the following reasons: Direction of a multidisciplinary physical rehabilitation program to maximize functional independence : Yes Medical management of patient stability for increased activity during participation in an intensive rehabilitation regime.: Yes Analysis of laboratory values and/or radiology reports with any subsequent need for medication adjustment and/or medical intervention. : Yes   I attest that I was present, lead the team conference, and concur with the assessment and plan of the team.   Cristi Loron 04/27/2021, 2:55 PM

## 2021-04-27 NOTE — Progress Notes (Signed)
Speech Language Pathology Daily Session Note  Patient Details  Name: Shannon Horton. MRN: [id number] Date of Birth: 1946/10/31  Today's Date: 04/27/2021 SLP Individual Time: 0820-0900 SLP Individual Time Calculation (min): 40 min  Short Term Goals: Week 1: SLP Short Term Goal 1 (Week 1): Patient will consistently orient to place, time, and situation with Mod A verbal cues SLP Short Term Goal 2 (Week 1): Patient will utilize external memory aids to recall new, daily information with Min A verbal cues SLP Short Term Goal 3 (Week 1): Patient will attend to basic functional tasks for 10 minute intervals with Mod A verbal cues for re-direction SLP Short Term Goal 4 (Week 1): Patient will complete basic tasks with Mod A verbal cues for funcitonal problem solving  Skilled Therapeutic Interventions: Skilled treatment session focused on cognitive goals. Upon arrival, patient was awake in bed with his breakfast tray sitting in front of him. SLP facilitated session by providing constant Max A multimodal cues for sustained attention to self-feeding due to distractibility. Patient was both internally and externally distracted and initiating conversations about unknown topics that appeared confused in nature. Patient had received a card and required Max A multimodal cues to problem solve how to open the card and identify who the card was from. Patient also required total A for orientation to place and required cues for recall throughout session. Patient left upright in bed with alarm on and all needs within reach. Continue with current plan of care.      Pain Pain Assessment Pain Scale: 0-10 Pain Score: 0-No pain  Therapy/Group: Individual Therapy  Ry Moody 04/27/2021, 10:00 AM

## 2021-04-27 NOTE — Progress Notes (Signed)
Palliative Medicine Inpatient Follow Up Note   HPI: 75 y.o. male  with past medical history of glaucoma, hypertension, tobacco use, and melanoma x2 removed from his right calf and left flank 2011 admitted on 04/21/2021 with multiple falls prior to admission, left hemiparesis, shuffling gait, and persistent headaches.  MRI of the brain showed a 3.8 cm enhancing and hemorrhagic mass in the right basal ganglia/frontal lobe region with a 5 mm left midline shift.  CT of the chest and abdomen was notable for left upper lobe pulmonary nodule, left hilar adenopathy, right upper lobe pulmonary nodule, left lower quadrant mass arising from the right adrenal gland, and hepatic lesions. CT-guided biopsy of the adrenal mass 03/30/2021 positive for malignancy.  Patient underwent right craniotomy resection of tumor 03/31/2021 per Dr. Reatha Armour.  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 Patient with ongoing left hemiparesis and visual deficits secondary to metastatic melanoma. Impaired mobility and cognition also continues. PMT consulted for [place] discussions.   The patient presented back to rehabilitation after course of re-admission in [facility] for hemorrhagic conversion melanoma metastases.  Providers and family are concerned that patient continues to be confused, drowsy, and participates poorly in rehab sessions.  Today's Discussion (* ): I went in to see the patient.  He was sleeping comfortably in bed, no family at bedside.  He woken easily to voice.  He states he is not hurting, no nausea or vomiting.  Requires cueing and processing time to answer questions.  However, his answers are mostly appropriate.  He knows his full name and the year.  He knows he is in [place] but thinks he is at [employer].  He denies any needs at this time.  I attempted to call the patient's spouse [name]) with no answer. Left a message with callback number for any questions or needs.  *Please note that this is a  verbal dictation therefore any spelling or grammatical errors are due to the "[place] [employer]" system interpretation.  Discussed the importance of continued conversation with family and their  medical providers regarding overall plan of care and treatment options, ensuring decisions are within the context of the patients values and GOCs.  Questions and concerns addressed   Objective Assessment: Vital Signs Vitals:   04/27/21 0727 04/27/21 1431  BP:  97/68  Pulse:  68  Resp:  18  Temp:  97.8 F (36.6 C)  SpO2: 97% 94%    Intake/Output Summary (Last 24 hours) at 04/27/2021 1636 Last data filed at 04/27/2021 1431 Gross per 24 hour  Intake 894 ml  Output 705 ml  Net 189 ml   Last Weight  Most recent update: 04/21/2021  3:08 PM    Weight  67.1 kg (147 lb 14.9 oz)            Gen:  NAD HEENT: moist mucous membranes CV: Regular rate and rhythm, no murmurs rubs or gallops PULM: clear to auscultation bilaterally. No wheezes/rales/rhonchi ABD: soft/nontender/nondistended/normal bowel sounds EXT: No edema Neuro: Sleeping but awakens easily. Oriented x2   Impression: CIR admission for rehab with slow neurologic progress.  Neurology is consulted today in inpatient rehab.  They think the etiology of his mental status are likely multifactorial.  They are recommending pulse dose dexamethasone to help increase participation in rehab.  If no improvement recommended consideration for 48-hour long-term EEG monitoring.  Further recommendations from neurology pending clinical progression.  SUMMARY OF RECOMMENDATIONS   Continue with neuro recommendations for increased rehab participation Monitor for  any pain or other symptoms management needs Continue to support family Disposition pending clinical progress/rehab participation in CIR: home health vs. SNF vs. Hospice (if significant decline) PMT to continue to follow  Time In: 4:00 Time Out: 4:35 Time Spent: 35 min Greater than 50% of the  time was spent in counseling and coordination of care ______________________________________________________________________________________ Walden Field, NP [place] Team Team Cell Phone: [phone]  Wadie Lessen NP in collaboration with Walden Field NP is in agreement with above assessment and plan.  PMT will continue to support holistically.  Please utilize secure chat with additional questions, if there is no response within 30 minutes please call the above phone number  Palliative Medicine Team providers are available by phone from 7am to 7pm daily and can be reached through the team cell phone.  Should this patient require assistance outside of these hours, please call the patient's attending physician.

## 2021-04-27 NOTE — Progress Notes (Signed)
Patient ID: Shannon Jury., male   DOB: [birth year], 75 y.o.   MRN: [id number]  SW met with pt and pt son Shannon Horton in her room to provide updates from team conference with ELOS 2-2.5 weeks. SW informed aware of meeting with oncology this afternoon. Son reports his mother will be  here for the visit with oncology this afternoon.  SW indicated there will be follow-up to discuss further next steps.   Loralee Pacas, MSW, [place] Office: [phone] Cell: [phone] Fax: [phone]

## 2021-04-27 NOTE — Progress Notes (Signed)
Physical Therapy Session Note  Patient Details  Name: Shannon Horton. MRN: [id number] Date of Birth: 09/18/1946  Today's Date: 04/27/2021 PT Individual Time: 1856-3149 PT Individual Time Calculation (min): 30 min   Short Term Goals: Week 1:  PT Short Term Goal 1 (Week 1): Patient will perform basic transfers with mod A consistently. PT Short Term Goal 2 (Week 1): Patient will perform bed mobility with min A. PT Short Term Goal 3 (Week 1): Patient will perform sitting balance with mod A consistently. PT Short Term Goal 4 (Week 1): Patient will initiate gait training.  Skilled Therapeutic Interventions/Progress Updates:     Patient in sitting up in the bed with breakfast tray in front of him upon PT arrival. Patient alert and agreeable to PT session. Patient denied pain during session.  Patient reported that he did not want any more of the eggs, toast, or bacon. Patient agreeable to trying magic cup for improved nutritional intake. Patient required 5 cues to redirect to this task due to internal distraction and verbose language, cued to focus on task and wait to speak until the task is complete, and required >10 min to complete the task.   Discussed patient's preferred food choices for breakfast. Patient reports that he does not eat anything for breakfast at home, that he just has orange juice and coffee. NT made aware to assist patient with order more plain foods for breakfast with orange juice and coffee available as well.   Patient perseverative on calling his wife on his portal device after. Attempted to redirect patient to therapeutic tasks, patient insistent about calling his wife and unable to be redirected. Had patient walk therapist through setting up his Portal device and how to call his wife with mod cuing for sequencing and visual scanning for correct buttons. Patient able to maintain sustained attention to this task >5 min. Once connected with his wife, he requested to have time  to speak with his wife and declined further therapeutic intervention at this time.   Patient missed 30 min of skilled PT due to inattention to therapeutic tasks, RN made aware. Will attempt to make-up missed time as able.    Patient in bed at end of session with breaks locked, bed alarm set, and all needs within reach.   Therapy Documentation Precautions:  Precautions Precautions: Fall Precaution Comments: R crani Restrictions Weight Bearing Restrictions: No General: PT Amount of Missed Time (min): 30 Minutes PT Missed Treatment Reason: Other (Comment) (unable to redirect patient to tx)    Therapy/Group: Individual Therapy  Amy Belloso L Katie Moch PT, DPT  04/27/2021, 10:00 AM

## 2021-04-27 NOTE — Progress Notes (Signed)
Occupational Therapy TBI Note  Patient Details  Name: Shannon Horton. MRN: [id number] Date of Birth: [birth year]  Today's Date: 04/27/2021 OT Individual Time: 0932-3557 OT Individual Time Calculation (min): 40 min    Short Term Goals: Week 1:  OT Short Term Goal 1 (Week 1): pt will don shirt in supported sitting with S OT Short Term Goal 2 (Week 1): Pt will thread BLE into pants wiht no more than MIN A for sitting balance during supported sitting OT Short Term Goal 3 (Week 1): Pt will transfer to toilet wiht MOD A OT Short Term Goal 4 (Week 1): Pt will terminate oral care with no more than 2 VC  Skilled Therapeutic Interventions/Progress Updates:    Pt received supine with his son Quita Skye present. Discussed pt's CLOF and cognition throughout session in the context of goals of care. Pt completed bed mobility with mod A to EOB. Pt required frequent cueing for correcting L lean and mod A overall. Pt UB bathing and dressing mod A overall. He demonstrated confusion and confabulation throughout session. Pt completed sit > stand with mod A. Pt incontinent of urine and unaware. Max A for clothing management and peri hygiene. Max A for stand pivot transfer to the TIS w/c. Min A for oral hygiene at the sink. Max A stand pivot transfer back to bed. Pt was left supine with all needs met, bed alarm set.   Therapy Documentation Precautions:  Precautions Precautions: Fall Precaution Comments: R crani Restrictions Weight Bearing Restrictions: No     Therapy/Group: Individual Therapy  Curtis Sites 04/27/2021, 6:25 AM

## 2021-04-28 LAB — BASIC METABOLIC PANEL
Anion gap: 10 (ref 5–15)
BUN: 20 mg/dL (ref 8–23)
CO2: 25 mmol/L (ref 22–32)
Calcium: 9.9 mg/dL (ref 8.9–10.3)
Chloride: 95 mmol/L — ABNORMAL LOW (ref 98–111)
Creatinine, Ser: 0.61 mg/dL (ref 0.61–1.24)
GFR, Estimated: >60 mL/min (ref >60)
Glucose, Bld: 136 mg/dL — ABNORMAL HIGH (ref 70–99)
Potassium: 4.2 mmol/L (ref 3.5–5.1)
Sodium: 130 mmol/L — ABNORMAL LOW (ref 135–145)

## 2021-04-28 LAB — CBC
HCT: 43.8 % (ref 39.0–52.0)
Hemoglobin: 15.1 g/dL (ref 13.0–17.0)
MCH: 31.2 pg (ref 26.0–34.0)
MCHC: 34.5 g/dL (ref 30.0–36.0)
MCV: 90.5 fL (ref 80.0–100.0)
Platelets: 319 10*3/uL (ref 150–400)
RBC: 4.84 MIL/uL (ref 4.22–5.81)
RDW: 12.7 % (ref 11.5–15.5)
WBC: 6.6 10*3/uL (ref 4.0–10.5)
nRBC: 0 % (ref 0.0–0.2)

## 2021-04-28 MED ORDER — DEXAMETHASONE 4 MG PO TABS
8.0000 mg | ORAL_TABLET | Freq: Two times a day (BID) | ORAL | Status: DC
  Administered 2021-04-29 – 2021-05-01 (×5): 8 mg via ORAL
  Filled 2021-04-28 (×5): qty 2

## 2021-04-28 MED ORDER — PROSOURCE PLUS PO LIQD
30.0000 mL | Freq: Every day | ORAL | Status: DC
  Administered 2021-04-28 – 2021-05-01 (×4): 30 mL via ORAL
  Filled 2021-04-28 (×4): qty 30

## 2021-04-28 MED ORDER — DEXAMETHASONE 4 MG PO TABS
12.0000 mg | ORAL_TABLET | Freq: Once | ORAL | Status: AC
  Administered 2021-04-28: 12 mg via ORAL
  Filled 2021-04-28: qty 3

## 2021-04-28 NOTE — Progress Notes (Signed)
Occupational Therapy Session Note  Patient Details  Name: Shannon Horton. MRN: [id number] Date of Birth: Dec 24, 1945  Today's Date: 04/28/2021 OT Individual Time: 1130-1155 OT Individual Time Calculation (min): 25 min    Short Term Goals: Week 1:  OT Short Term Goal 1 (Week 1): pt will don shirt in supported sitting with S OT Short Term Goal 2 (Week 1): Pt will thread BLE into pants wiht no more than MIN A for sitting balance during supported sitting OT Short Term Goal 3 (Week 1): Pt will transfer to toilet wiht MOD A OT Short Term Goal 4 (Week 1): Pt will terminate oral care with no more than 2 VC   Skilled Therapeutic Interventions/Progress Updates:    Pt received semifowler in bed and very lethargic. Pt req max cuing for attention during session, instructed in activities to attend to L side of body and L side of environment with poor carryover. Pt given pegs to place in peg board and instructed with [facility] assistance, however pt just takes and holds the pegs and unable to follow simple commands. Pt closes eyes frequently during session and has difficulty staying awake, attend to L side. BP taken and stable, communicated with NT and nurses and they report that is how he has been recently. Pt handed washcloth to complete functional task of washing face, however was perseverating on orange juice on tray. Pt assisted with orange juice cup by opening, putting straw in it, but pt did not seem to want it anymore. After tx, pt left semifowler in bed with alarm on and all needs met.    Therapy Documentation Precautions:  Precautions Precautions: Fall Precaution Comments: R crani Restrictions Weight Bearing Restrictions: No  Pain: Pain Assessment Pain Scale: 0-10 Pain Score: 0-No pain    Therapy/Group: Individual Therapy  Laticha Ferrucci 04/28/2021, 12:20 PM

## 2021-04-28 NOTE — Progress Notes (Addendum)
Nutrition Follow-up  DOCUMENTATION CODES:   Non-severe (moderate) malnutrition in context of chronic illness  INTERVENTION:  Continue Ensure Enlive po TID, each supplement provides 350 kcal and 20 grams of protein.  Continue Magic cup TID with meals, each supplement provides 290 kcal and 9 grams of protein.  Provide 30 ml Prosource plus po once daily, each supplement provides 100 kcal and 15 grams of protein.   Encourage adequate PO intake.   NUTRITION DIAGNOSIS:   Moderate Malnutrition related to chronic illness (metastatic melanoma) as evidenced by mild fat depletion, moderate fat depletion, mild muscle depletion, moderate muscle depletion, percent weight loss; ongoing  GOAL:   Patient will meet greater than or equal to 90% of their needs; progressing  MONITOR:   PO intake, Supplement acceptance, Labs, Weight trends, Skin, I & O's  REASON FOR ASSESSMENT:   Malnutrition Screening Tool    ASSESSMENT:   Shannon Horton is a 75 year old right-handed male with history of glaucoma followed by Dr. Satira Sark hypertension, tobacco use as well as remote melanoma x2 removed from his right calf and left flank 2011 with negative nodal biopsy  Pt admitted with left hemiparesis with visual deficits secondary to metastatic melanoma status postcraniotomy for resection of right frontal tumor 03/31/2021.  Pathology positive for malignant melanoma  Meal completion has been varied from 10-75%. Pt asleep during time of visit. Pt currently has Ensure ordered and has been consuming them. RD to additionally order Prosource plus to aid in protein needs. Labs and medications reviewed.   Diet Order:   Diet Order             Diet regular Room service appropriate? Yes; Fluid consistency: Thin  Diet effective now                   EDUCATION NEEDS:   Education needs have been addressed  Skin:  Skin Assessment: Reviewed RN Assessment Skin Integrity Issues:: Incisions Incisions: closed  rt head.  Last BM:  6/29  Height:   Ht Readings from Last 1 Encounters:  04/21/21 5\' 8"  (1.727 m)    Weight:   Wt Readings from Last 1 Encounters:  04/21/21 67.1 kg    Ideal Body Weight:  70 kg  BMI:  Body mass index is 22.49 kg/m.  Estimated Nutritional Needs:   Kcal:  2100-2300  Protein:  120-135 grams  Fluid:  > 2 L  Corrin Parker, MS, RD, LDN RD pager number/after hours weekend pager number on Amion.

## 2021-04-28 NOTE — Progress Notes (Signed)
Physical Therapy Session Note  Patient Details  Name: Shannon Horton. MRN: [id number] Date of Birth: 23-Dec-1945  Today's Date: 04/28/2021 PT Individual Time: 0800-0848 PT Individual Time Calculation (min): 48 min   Short Term Goals: Week 1:  PT Short Term Goal 1 (Week 1): Patient will perform basic transfers with mod A consistently. PT Short Term Goal 2 (Week 1): Patient will perform bed mobility with min A. PT Short Term Goal 3 (Week 1): Patient will perform sitting balance with mod A consistently. PT Short Term Goal 4 (Week 1): Patient will initiate gait training.  Skilled Therapeutic Interventions/Progress Updates:     Patient in bed asleep upon PT arrival. Patient very difficult to arouse in lying and with HOB elevated, opened his eyes x2 and nodded when asked if he was feeling okay x1. Vitals WFL when assessed.   Patient performed supine to sit with total A with HOB elevated. Patient more aroused in sitting with significant forward and L lean in sitting requiring propping on HOB near 90 degrees with min A for sitting balance, progressed to min A with HOB lowered with focus on pushing with L hand against HOB then visual target to shift shoulders to the R with mod-max multimodal cues.   Performed sit<>stand EOB x6 with mod A using RW focused on forward weight shift and attaining erect posture with knee/hip/trunk extension in standing long enough for PT to pull up his shorts. Patient unable to sustain standing >5-10 sec on first 5 trials then on 6th trial with increased tactile cuing at gluteals to promote hip extension, patient able to stan >20 sec and PT was able to pull up his pants with total A. Patient then performed squat pivot transfer with mod A bed>TIS w/c. Provided hand over hand assist for reaching to arm rest to promote froward trunk flexion during transfer and mod multimodal cues to initiate mobility and pivot. Transported  patient to therapy gym and performed transferred, as  above, to mat table seated with small red wedge under B ischial tuberosities to promote anterior pelvic tilt in sitting, shifted wedge more under L IT to reduce L lean.   Patient performed sitting balance seated on EOM with red wedge, as above, progressed from min A to supervision in mid-line then performed ipsilateral progressing to contralateral reaching to a visual target >3 min with supervision-min A. Patient required progressively increased cuing, min>max cues, to attend to task and intermittent hand over hand assist to remove R hand from the mat table.   Patient transported back to the room and stated that he needed to have a BM. Performed squat pivot to [facility] over the toilet with mod A and use of grab bar, as above. Performed sit to stand with mod A and grab bar to perform lower body clothing management following transfer. Patient only with partial stand and required additional assist for lateral leans to clear his clothing for toileting. Patient with incontinent of bowl in his brief prior to toileting. NT arrived with Stedy to assist with peri-care and return patient to the bed, waiting on second assist. Patient seated on [facility] with Stedy in place, handed off to [place], NT at end of session.   Therapy Documentation Precautions:  Precautions Precautions: Fall Precaution Comments: R crani Restrictions Weight Bearing Restrictions: No   Therapy/Group: Individual Therapy  Jeanclaude Wentworth L Tab Rylee PT, DPT  04/28/2021, 12:45 PM

## 2021-04-28 NOTE — Progress Notes (Signed)
Speech Language Pathology Daily Session Note  Patient Details  Name: Singleton Hickox. MRN: [id number] Date of Birth: [birth year]  Today's Date: 04/28/2021 SLP Individual Time: 0945-1000 SLP Individual Time Calculation (min): 15 min and Today's Date: 04/28/2021 SLP Missed Time: 30 Minutes Missed Time Reason: Patient fatigue  Short Term Goals: Week 1: SLP Short Term Goal 1 (Week 1): Patient will consistently orient to place, time, and situation with Mod A verbal cues SLP Short Term Goal 2 (Week 1): Patient will utilize external memory aids to recall new, daily information with Min A verbal cues SLP Short Term Goal 3 (Week 1): Patient will attend to basic functional tasks for 10 minute intervals with Mod A verbal cues for re-direction SLP Short Term Goal 4 (Week 1): Patient will complete basic tasks with Mod A verbal cues for funcitonal problem solving  Skilled Therapeutic Interventions: Skilled treatment session focused on cognitive goals. Upon arrival, patient was asleep in bed with his son, Haze Boyden present. Patient was extremely lethargic and required Max verbal, tactile and environmental cues for arousal. Patient would open his eyes but then would quickly fall back to sleep with minimal verbal responses to questions. RN and PA aware. PA also spoke to son about current lethargy, etc. Due to patient's lethargy and decreased ability to participate, session ended early. Will re-attempt as able. Patient left upright in bed with alarm on and all needs within reach. Continue with current plan of care.     Pain Pain Assessment Pain Scale: 0-10 Pain Score: 0-No pain  Therapy/Group: Individual Therapy  Axiel Fjeld 04/28/2021, 12:17 PM

## 2021-04-28 NOTE — Progress Notes (Signed)
Occupational Therapy Session Note  Patient Details  Name: Shannon Horton. MRN: [id number] Date of Birth: 05/24/1946  Today's Date: 04/28/2021 OT Individual Time: 1500-1530 OT Individual Time Calculation (min): 30 min    Short Term Goals: Week 1:  OT Short Term Goal 1 (Week 1): pt will don shirt in supported sitting with S OT Short Term Goal 2 (Week 1): Pt will thread BLE into pants wiht no more than MIN A for sitting balance during supported sitting OT Short Term Goal 3 (Week 1): Pt will transfer to toilet wiht MOD A OT Short Term Goal 4 (Week 1): Pt will terminate oral care with no more than 2 VC  Skilled Therapeutic Interventions/Progress Updates:    Pt with Nts in room finishing peri care. Pt with poor expression today demoing increased difficulty as well as poor processing speed/initiation. NT reports needing total A to self feed this date for lunch. Pt completes SB transfers for transfer training EOB>TIS<>EOM. Pt requires MIN-MOD A for SB transfers on level to small uphill inclines. Pt requires counting for initiation of transfer and scooting as well as head hips relationship. Pt wife requesting pt stay up in TIS at end of session to assist with arousal. Exited session with pt seated in bed, exit alarm on and call light in reach   Therapy Documentation Precautions:  Precautions Precautions: Fall Precaution Comments: R crani Restrictions Weight Bearing Restrictions: No General:   Vital Signs: Therapy Vitals Temp: 98.3 F (36.8 C) Pulse Rate: 66 Resp: 15 BP: 111/76 Patient Position (if appropriate): Lying Oxygen Therapy SpO2: 95 % O2 Device: Room Air Pain:   ADL: ADL Eating: Set up Grooming: Minimal assistance Where Assessed-Grooming: Sitting at sink Upper Body Bathing: Minimal assistance Where Assessed-Upper Body Bathing: Wheelchair, Sitting at sink Lower Body Bathing: Moderate assistance Where Assessed-Lower Body Bathing: Wheelchair, Sitting at sink Upper  Body Dressing: Minimal assistance Where Assessed-Upper Body Dressing: Sitting at sink, Wheelchair Lower Body Dressing: Moderate assistance Where Assessed-Lower Body Dressing: Sitting at sink, Standing at sink Vision   Perception    Praxis   Exercises:   Other Treatments:     Therapy/Group: Individual Therapy  Tonny Branch 04/28/2021, 6:59 AM

## 2021-04-28 NOTE — Progress Notes (Signed)
PROGRESS NOTE   Subjective/Complaints: No issues overnight. Working on breakfast with SLP. Very slow to initiate feeding. Distracted. Needs continued cueing  ROS: Limited due to cognitive/behavioral   Objective:   No results found. Recent Labs    04/28/21 0515  WBC 6.6  HGB 15.1  HCT 43.8  PLT 319    Recent Labs    04/28/21 0515  NA 130*  K 4.2  CL 95*  CO2 25  GLUCOSE 136*  BUN 20  CREATININE 0.61  CALCIUM 9.9    Intake/Output Summary (Last 24 hours) at 04/28/2021 6294 Last data filed at 04/28/2021 0523 Gross per 24 hour  Intake 954 ml  Output 625 ml  Net 329 ml        Physical Exam: Vital Signs Blood pressure 111/76, pulse 66, temperature 98.3 F (36.8 C), resp. rate 15, height 5\' 8"  (1.727 m), weight 67.1 kg, SpO2 95 %. Constitutional: No distress . Vital signs reviewed. HEENT: EOMI, oral membranes moist Neck: supple Cardiovascular: RRR without murmur. No JVD    Respiratory/Chest: CTA Bilaterally without wheezes or rales. Normal effort    GI/Abdomen: BS +, non-tender, non-distended Ext: no clubbing, cyanosis, or edema Psych: pleasant and cooperative Skin: scattered bruising, NSL Neurological:    Comments: alert, delayed, distracted also. Does follow simple commands with extra time.  Moves all 4's. 4- to 4/5 strength throughout. Speech clearer when he phonates.   MSK: kyphotic posture. No pain.   Assessment/Plan: 1. Functional deficits which require 3+ hours per day of interdisciplinary therapy in a comprehensive inpatient rehab setting. Physiatrist is providing close team supervision and 24 hour management of active medical problems listed below. Physiatrist and rehab team continue to assess barriers to discharge/monitor patient progress toward functional and medical goals  Care Tool:  Bathing    Body parts bathed by patient: Right arm, Left arm, Chest, Abdomen, Right upper leg, Left upper  leg, Face   Body parts bathed by helper: Right lower leg, Left lower leg Body parts n/a: Buttocks, Front perineal area   Bathing assist Assist Level: Maximal Assistance - Patient 24 - 49% (for sitting balance seated at sink)     Upper Body Dressing/Undressing Upper body dressing   What is the patient wearing?: Pull over shirt    Upper body assist Assist Level: Minimal Assistance - Patient > 75%    Lower Body Dressing/Undressing Lower body dressing      What is the patient wearing?: Incontinence brief     Lower body assist Assist for lower body dressing: Maximal Assistance - Patient 25 - 49%     Toileting Toileting    Toileting assist Assist for toileting: Maximal Assistance - Patient 25 - 49% Assistive Device Comment: urinal   Transfers Chair/bed transfer  Transfers assist     Chair/bed transfer assist level: Moderate Assistance - Patient 50 - 74% Chair/bed transfer assistive device: Armrests   Locomotion Ambulation   Ambulation assist   Ambulation activity did not occur: Safety/medical concerns (patient unable to stand >5 sec with max A before LOB and total A to sit)  Assist level: 2 helpers Assistive device: Walker-rolling Max distance: 18 ft   Walk 10 feet  activity   Assist  Walk 10 feet activity did not occur: Safety/medical concerns  Assist level: 2 helpers Assistive device: Walker-rolling   Walk 50 feet activity   Assist Walk 50 feet with 2 turns activity did not occur: Safety/medical concerns         Walk 150 feet activity   Assist Walk 150 feet activity did not occur: Safety/medical concerns         Walk 10 feet on uneven surface  activity   Assist Walk 10 feet on uneven surfaces activity did not occur: Safety/medical concerns         Wheelchair     Assist Will patient use wheelchair at discharge?: Yes (Per PT long term goals) Type of Wheelchair: Manual Wheelchair activity did not occur: Safety/medical concerns  (patient with significant forward lean and needed PT to block him in sitting for safety, used TIS w/c)         Wheelchair 50 feet with 2 turns activity    Assist    Wheelchair 50 feet with 2 turns activity did not occur: Safety/medical concerns       Wheelchair 150 feet activity     Assist  Wheelchair 150 feet activity did not occur: Safety/medical concerns       Blood pressure 111/76, pulse 66, temperature 98.3 F (36.8 C), resp. rate 15, height 5\' 8"  (1.727 m), weight 67.1 kg, SpO2 95 %.    Medical Problem List and Plan: 1.  Left hemiparesis with visual deficits secondary to metastatic melanoma status postcraniotomy for resection of right frontal tumor 03/31/2021.  Pathology positive for malignant melanoma             -patient may shower             -ELOS/Goals: MinA 10-14 days  -appreciate Dr. Renda Rolls consultation and recs   -pulse dose dexamethasone initiated as recommended for CNS inflammation/adrenal axis suppression 2.  Impaired mobility/s/p craniotomy: -DVT/anticoagulation: Continue Subcutaneous heparin 5000 units every 12 hours until cleared by NSGY. Please initiate heparin education to family.             -antiplatelet therapy: N/A 3. Pain Management: Tylenol as needed 4. Impaired attention/initiation:   Ritalin-increased to 10mg  bid  -recheck labs tomorrow, check ua also today             -antipsychotic agents: N/A 5. Neuropsych: This patient is not capable of making decisions on his own behalf. 6. Skin/Wound Care: Routine skin checks 7. Fluids/Electrolytes/Nutrition: Routine in and outs with follow-up chemistries 8.  Seizure prophylaxis.  keppra completed. 9.  Hypertension.  Norvasc 5 mg daily, Avapro 300 mg daily, HCTZ 25 mg daily.     -bp well controlled 10.  Hyponatremia.  6/29 sodium 130 -continue Sodium chloride tablets 2 g twice daily.   -1500 cc FR -hold HCTZ (first time today) -serial bmet  11.  History of glaucoma.  Followed by Dr. Satira Sark.   Continue eyedrops as directed. 12.  History of asthma.  Continue inhalers as directed 13. Bradycardia: continue to monitor TID, normalized. 60's 14. Hypomagnesemia: magnesium normalized to 2.  15. Hypokalemia: K+ 4.2.  Continue supplementation  LOS: 7 days A FACE TO [place] 04/28/2021, 8:22 AM

## 2021-04-29 ENCOUNTER — Ambulatory Visit: Payer: Medicare PPO | Admitting: Internal Medicine

## 2021-04-29 DIAGNOSIS — C711 Malignant neoplasm of frontal lobe: Secondary | ICD-10-CM

## 2021-04-29 DIAGNOSIS — E871 Hypo-osmolality and hyponatremia: Secondary | ICD-10-CM

## 2021-04-29 LAB — BASIC METABOLIC PANEL
Anion gap: 8 (ref 5–15)
BUN: 28 mg/dL — ABNORMAL HIGH (ref 8–23)
CO2: 26 mmol/L (ref 22–32)
Calcium: 9.7 mg/dL (ref 8.9–10.3)
Chloride: 98 mmol/L (ref 98–111)
Creatinine, Ser: 0.74 mg/dL (ref 0.61–1.24)
GFR, Estimated: >60 mL/min (ref >60)
Glucose, Bld: 121 mg/dL — ABNORMAL HIGH (ref 70–99)
Potassium: 4.3 mmol/L (ref 3.5–5.1)
Sodium: 132 mmol/L — ABNORMAL LOW (ref 135–145)

## 2021-04-29 MED ORDER — METHYLPHENIDATE HCL 5 MG PO TABS
15.0000 mg | ORAL_TABLET | Freq: Two times a day (BID) | ORAL | Status: DC
  Administered 2021-04-29 – 2021-05-01 (×5): 15 mg via ORAL
  Filled 2021-04-29 (×5): qty 3

## 2021-04-29 NOTE — Progress Notes (Signed)
Speech Language Pathology Weekly Progress and Session Note  Patient Details  Name: Shannon Horton. MRN: [id number] Date of Birth: 02/21/1946  Beginning of progress report period: April 22, 2021 End of progress report period: April 29, 2021  Today's Date: 04/29/2021 SLP Individual Time: 0915-1000 SLP Individual Time Calculation (min): 45 min  Short Term Goals: Week 1: SLP Short Term Goal 1 (Week 1): Patient will consistently orient to place, time, and situation with Mod A verbal cues SLP Short Term Goal 1 - Progress (Week 1): Not met SLP Short Term Goal 2 (Week 1): Patient will utilize external memory aids to recall new, daily information with Min A verbal cues SLP Short Term Goal 2 - Progress (Week 1): Not met SLP Short Term Goal 3 (Week 1): Patient will attend to basic functional tasks for 10 minute intervals with Mod A verbal cues for re-direction SLP Short Term Goal 3 - Progress (Week 1): Not met SLP Short Term Goal 4 (Week 1): Patient will complete basic tasks with Mod A verbal cues for funcitonal problem solving SLP Short Term Goal 4 - Progress (Week 1): Not met    New Short Term Goals: Week 2: SLP Short Term Goal 1 (Week 2): Patient will consistently orient to place, time, and situation with Mod A verbal and visual cues SLP Short Term Goal 2 (Week 2): Patient will demonstrate susatained attention to functional tasks for 5 minutes with Mod verbal cues for redirection. SLP Short Term Goal 3 (Week 2): Patient will complete basic problem solving tasks with 50% accuracy and Mod A cues  Weekly Progress Updates: Patient has made minimal gains and has not consistently met any STGs this reporting period. Currently, patient remains lethargic with Max multimodal cues needed for arousal and attention. Max-Total A multimodal cues are also needed for orientation at times due to generalized confusion, recall of functional information and basic problem solving. Patient and family education  ongoing. Patient would benefit from continued skilled SLP intervention to maximize his cognitive functioning prior to discharge.      Intensity: Minumum of 1-2 x/day, 30 to 90 minutes Frequency: 3 to 5 out of 7 days Duration/Length of Stay: 2 weeks Treatment/Interventions: Cueing hierarchy;Functional tasks;Patient/family education;Internal/external aids;Environmental controls;Therapeutic Activities;Cognitive remediation/compensation   Daily Session  Skilled Therapeutic Interventions:  Skilled treatment session focused on cognitive goals. Upon arrival, patient appeared lethargic while upright in the wheelchair but did appear more awake compared to yesterday's session. SLP facilitated session by providing extra time and multiple repetitions for patient to answer basic questions at the word and phrase level in 75% of opportunities. SLP created large visual aids to assist in recall of place and date that patient was able to utilize with Mod verbal and visual cues. SLP also facilitated session by providing Max A verbal cues for initiation, attention and problem solving while placing/organizing numbers into a large calendar. Patient was transferred back to bed at end of session with +2 slideboard transfer. Patient left upright in bed with alarm on and all needs within reach. Continue with current plan of care.    Pain No/Denies Pain   Therapy/Group: Individual Therapy  Makenzey Nanni 04/29/2021, 6:37 AM

## 2021-04-29 NOTE — Progress Notes (Signed)
Occupational Therapy Weekly Progress Note  Patient Details  Name: Shannon Horton. MRN: [id number] Date of Birth: 11-13-1945  Beginning of progress report period: April 22, 2021 End of progress report period: April 29, 2021  Today's Date: 04/29/2021 OT Individual Time: 1345-1455 OT Individual Time Calculation (min): 70 min    Patient has met 0 of 4 short term goals.  Pt has made minimal gains this reporting period and has progression of cancer consequently impacting overall cognitive function impairing performance of BADLS and functional transfers. Currently pt requires up to MOD A for bed level ADLs d/t poor initiation, min-mod A SB transfers and MAX A for STS d/t poor postural control/midline orientation. Pt would benefit from skilled OT to decrease BOC prior to DC home  Patient continues to demonstrate the following deficits: muscle weakness and muscle joint tightness, decreased cardiorespiratoy endurance, impaired timing and sequencing, unbalanced muscle activation, motor apraxia, and decreased coordination, decreased attention to left, decreased initiation, decreased attention, decreased awareness, decreased problem solving, decreased safety awareness, decreased memory, and delayed processing, and decreased sitting balance, decreased standing balance, decreased postural control, hemiplegia, and decreased balance strategies and therefore will continue to benefit from skilled OT intervention to enhance overall performance with BADL and Reduce care partner burden.  Patient progressing toward long term goals..  Continue plan of care.  OT Short Term Goals Week 1:  OT Short Term Goal 1 (Week 1): pt will don shirt in supported sitting with S OT Short Term Goal 1 - Progress (Week 1): Not progressing OT Short Term Goal 2 (Week 1): Pt will thread BLE into pants wiht no more than MIN A for sitting balance during supported sitting OT Short Term Goal 2 - Progress (Week 1): Not progressing OT Short  Term Goal 3 (Week 1): Pt will transfer to toilet wiht MOD A OT Short Term Goal 3 - Progress (Week 1): Not progressing OT Short Term Goal 4 (Week 1): Pt will terminate oral care with no more than 2 VC OT Short Term Goal 4 - Progress (Week 1): Not progressing Week 2:  OT Short Term Goal 1 (Week 2): Pt will don shirt in supported sitting with MIN A OT Short Term Goal 2 (Week 2): Pt will consistently transfer with MIN A and LRAD OT Short Term Goal 3 (Week 2): pt will roll in bed with S to decrease BOC with LB dressing/incontinent episodes OT Short Term Goal 4 (Week 2): Pt will sit EOB with MOD A dynamically  Skilled Therapeutic Interventions/Progress Updates:    1:1. Pt received in bed agreeable to OT. Pt completes bed mobility with MIN A for trunk stabilizaiton for scooting to EOB. Pt with improved arousal and initiation compared to yesterday but pts sustained attention still remains significantly impaired impacting the efficiency of completing tasks. Pt completes SB transfers training to/from bed/mat with MIN A Overall with significant time to process cues to scoot across board. Pt completes 2x5 min of BUE ergometer for sustained attention and return to leisure focus (pt avic cyclist PTA) with pt selecting preferred music to listen to. Pt unable to recall how many minutes he was supposed to pedal, and continues pedaling past 5 min mark despite question cues. Pt sits EOM for postural control with MAX facilitation of trunk extension onto theraball behind pt while folding towels placing in pile on L for L attention. Pt with best posture when cued to "look over my head" with OT sitting in front of patient. Pt requires total A  to change brief and increased time to process rolling cues at end of session when returned back to bed.  Therapy Documentation Precautions:  Precautions Precautions: Fall Precaution Comments: R crani Restrictions Weight Bearing Restrictions: No General:   Vital Signs: Therapy  Vitals Temp: 98 F (36.7 C) Temp Source: Oral Pulse Rate: 60 Resp: 16 BP: 122/76 Patient Position (if appropriate): Lying Oxygen Therapy SpO2: 98 % O2 Device: Room Air Pain:   ADL: ADL Eating: Set up Grooming: Minimal assistance Where Assessed-Grooming: Sitting at sink Upper Body Bathing: Minimal assistance Where Assessed-Upper Body Bathing: Wheelchair, Sitting at sink Lower Body Bathing: Moderate assistance Where Assessed-Lower Body Bathing: Wheelchair, Sitting at sink Upper Body Dressing: Minimal assistance Where Assessed-Upper Body Dressing: Sitting at sink, Wheelchair Lower Body Dressing: Moderate assistance Where Assessed-Lower Body Dressing: Sitting at sink, Standing at sink Vision   Perception    Praxis   Exercises:   Other Treatments:     Therapy/Group: Individual Therapy  Tonny Branch 04/29/2021, 7:00 AM

## 2021-04-29 NOTE — Progress Notes (Signed)
Physical Therapy Weekly Progress Note  Patient Details  Name: Shannon Horton. MRN: [id number] Date of Birth: May 20, 1946  Beginning of progress report period: April 22, 2021 End of progress report period: April 29, 2021  Today's Date: 04/29/2021 PT Individual Time: 0800-0900 PT Individual Time Calculation (min): 60 min   Patient has met 3 of 4 short term goals. Patient with slow, but steady progress limited by deficits in activity tolerance, attention, motor planning, and postural control. Patient requires min A and increased time for bed mobility, mod-min A for squat pivot and sit to stand transfers with [place] walker or counter top, mod-max A with RW, and min A-supervision with slide board, and has ambulated up to 25 feet with the [place] walker with mod-max A.  Patient continues to demonstrate the following deficits muscle weakness and muscle joint tightness, decreased cardiorespiratoy endurance, impaired timing and sequencing, abnormal tone, decreased coordination, and decreased motor planning, decreased midline orientation and decreased attention to left, decreased initiation, decreased attention, decreased awareness, decreased problem solving, decreased safety awareness, decreased memory, and delayed processing, and decreased sitting balance, decreased standing balance, decreased postural control, hemiplegia, and decreased balance strategies and therefore will continue to benefit from skilled PT intervention to increase functional independence with mobility.  Patient progressing toward long term goals. Continue plan of care.  PT Short Term Goals Week 1:  PT Short Term Goal 1 (Week 1): Patient will perform basic transfers with mod A consistently. PT Short Term Goal 1 - Progress (Week 1): Met PT Short Term Goal 2 (Week 1): Patient will perform bed mobility with min A. PT Short Term Goal 2 - Progress (Week 1): Met PT Short Term Goal 3 (Week 1): Patient will perform sitting balance with  mod A consistently. PT Short Term Goal 3 - Progress (Week 1): Progressing toward goal PT Short Term Goal 4 (Week 1): Patient will initiate gait training. PT Short Term Goal 4 - Progress (Week 1): Met Week 2:  PT Short Term Goal 1 (Week 2): Patient will perform basic transfers using LRAD with min A >75% of the time. PT Short Term Goal 2 (Week 2): Patient will perform sitting balance with mod A consistently and min A >50% of the time. PT Short Term Goal 3 (Week 2): Patient will perform functional standing with min A >75% of the time.  Skilled Therapeutic Interventions/Progress Updates:     Patient in bed upon PT arrival. Patient alert and agreeable to PT session. Patient denied pain during session. Patient did not respond to orientation questions when asked this morning. Patient reacted to therapist's cues, but remained minimally verbal to nonverbal throughout session. MD rounded during session and updated on patient's progress.  Therapeutic Activity: Bed Mobility: Patient performed rolling R/L with min A with use of bed rail to don pants with total A bed level. He performed supine to sit with min A with mod cues and facilitation for rolling L then pushing to sit up. He performed sit to supine with supervision and min verbal and visual cues. Patient required increased time to initiate and perform all bed mobility to achieve lower level of assist.   Transfers: Patient performed squat pivot bed>w/c with mod A and max cues and hand over hand assist to grab arm rest to promote forward weight shift, most assist provided for pivoting hips into the chair. Patient performed scooting back x2 with CGA and max cues. He performed sit to/from stand x2 using the [place] walker with mod A and  mod cues for hand/arm placement. He performed sit to/from stand at the sink x2 with min A and min cue. Provided multimodal cues for forward weight, shift, hand placement, and initiation for all transfers.  Gait Training:   Patient ambulated 25 feet x2 using [place] walker with mod progressing to max A due to increasing crouched and shuffling gait and flexed posture. Provided max multimodal cues for hip/knee/trunk extension with 1-2 standing breaks to address flexed positioning with successful erect posture achieved in standing that quickly returned to flexed position while ambulating.  Neuromuscular Re-ed: Patient performed the following sitting and standing balance activities: -sitting EOB >5 min: patient required mod-max A for static sitting balance due to posterior lean, progressed to min A-CGA with cues for hand placement on knees, performed dynamic sitting balance while doffing/donning a t-shirt with mod-max A for dressing and min-mod A for sitting balance with max cues for maintaining trunk in forward position and min cues for sequencing of task -standing balance at the sink 2x1-2 min: stood at the sink with CGA and erect posture using the mirror as visual feedback for posture, patient commented on needing to comb his hair, required min cues to locate the comb at the sink and to reach out and grab the comb, patient then self-initiated combing his hair using his R hand for the right side and L hand for the L side, maintained standing balance with min A during task; on second trial focused on posture in standing with more facilitation for hip extension and shoulder retraction with min A-CGA for standing balance  Patient in TIS w/c at end of session with breaks locked, seat belt alarm set, and all needs within reach.   Therapy Documentation Precautions:  Precautions Precautions: Fall Precaution Comments: R crani Restrictions Weight Bearing Restrictions: No   Therapy/Group: Individual Therapy  Annaliah Rivenbark L Shanequia Kendrick PT, DPT  04/29/2021, 7:48 PM

## 2021-04-29 NOTE — Progress Notes (Signed)
PROGRESS NOTE   Subjective/Complaints: Pt up in gym with PT. A little brighter this morning. Still slow to process, distracted.   ROS: Limited due to cognitive/behavioral  Objective:   No results found. Recent Labs    04/28/21 0515  WBC 6.6  HGB 15.1  HCT 43.8  PLT 319    Recent Labs    04/28/21 0515 04/29/21 0455  NA 130* 132*  K 4.2 4.3  CL 95* 98  CO2 25 26  GLUCOSE 136* 121*  BUN 20 28*  CREATININE 0.61 0.74  CALCIUM 9.9 9.7    Intake/Output Summary (Last 24 hours) at 04/29/2021 1015 Last data filed at 04/28/2021 1700 Gross per 24 hour  Intake 507 ml  Output --  Net 507 ml        Physical Exam: Vital Signs Blood pressure 122/76, pulse 65, temperature 98 F (36.7 C), temperature source Oral, resp. rate 16, height 5\' 8"  (1.727 m), weight 66.8 kg, SpO2 98 %. Constitutional: No distress . Vital signs reviewed. HEENT: EOMI, oral membranes moist Neck: supple Cardiovascular: RRR without murmur. No JVD    Respiratory/Chest: CTA Bilaterally without wheezes or rales. Normal effort    GI/Abdomen: BS +, non-tender, non-distended Ext: no clubbing, cyanosis, or edema Psych: flat, delayed Skin: scattered bruising, NSL Neurological:    Comments: more alert, still slow to process but a little more attentive.  Moves all 4's. 4- to 4/5 strength throughout. Speech clearer when he phonates.   MSK: no pain today in trunk/limbs   Assessment/Plan: 1. Functional deficits which require 3+ hours per day of interdisciplinary therapy in a comprehensive inpatient rehab setting. Physiatrist is providing close team supervision and 24 hour management of active medical problems listed below. Physiatrist and rehab team continue to assess barriers to discharge/monitor patient progress toward functional and medical goals  Care Tool:  Bathing    Body parts bathed by patient: Right arm, Left arm, Chest, Abdomen, Right upper  leg, Left upper leg, Face   Body parts bathed by helper: Right lower leg, Left lower leg Body parts n/a: Buttocks, Front perineal area   Bathing assist Assist Level: Maximal Assistance - Patient 24 - 49% (for sitting balance seated at sink)     Upper Body Dressing/Undressing Upper body dressing   What is the patient wearing?: Pull over shirt    Upper body assist Assist Level: Minimal Assistance - Patient > 75%    Lower Body Dressing/Undressing Lower body dressing      What is the patient wearing?: Incontinence brief     Lower body assist Assist for lower body dressing: Maximal Assistance - Patient 25 - 49%     Toileting Toileting    Toileting assist Assist for toileting: Maximal Assistance - Patient 25 - 49% Assistive Device Comment: urinal   Transfers Chair/bed transfer  Transfers assist     Chair/bed transfer assist level: Moderate Assistance - Patient 50 - 74% Chair/bed transfer assistive device: Armrests   Locomotion Ambulation   Ambulation assist   Ambulation activity did not occur: Safety/medical concerns (patient unable to stand >5 sec with max A before LOB and total A to sit)  Assist level: 2 helpers Assistive  device: Walker-rolling Max distance: 18 ft   Walk 10 feet activity   Assist  Walk 10 feet activity did not occur: Safety/medical concerns  Assist level: 2 helpers Assistive device: Walker-rolling   Walk 50 feet activity   Assist Walk 50 feet with 2 turns activity did not occur: Safety/medical concerns         Walk 150 feet activity   Assist Walk 150 feet activity did not occur: Safety/medical concerns         Walk 10 feet on uneven surface  activity   Assist Walk 10 feet on uneven surfaces activity did not occur: Safety/medical concerns         Wheelchair     Assist Will patient use wheelchair at discharge?: Yes (Per PT long term goals) Type of Wheelchair: Manual Wheelchair activity did not occur:  Safety/medical concerns (patient with significant forward lean and needed PT to block him in sitting for safety, used TIS w/c)         Wheelchair 50 feet with 2 turns activity    Assist    Wheelchair 50 feet with 2 turns activity did not occur: Safety/medical concerns       Wheelchair 150 feet activity     Assist  Wheelchair 150 feet activity did not occur: Safety/medical concerns       Blood pressure 122/76, pulse 65, temperature 98 F (36.7 C), temperature source Oral, resp. rate 16, height 5\' 8"  (1.727 m), weight 66.8 kg, SpO2 98 %.    Medical Problem List and Plan: 1.  Left hemiparesis with visual deficits secondary to metastatic melanoma status postcraniotomy for resection of right frontal tumor 03/31/2021.  Pathology positive for malignant melanoma             -patient may shower             -ELOS/Goals: MinA 10-14 days  -appreciate Dr. Renda Rolls consultation and recs   -pulse dose dexamethasone initiated as recommended for CNS inflammation/adrenal axis suppression 2.  Impaired mobility/s/p craniotomy: -DVT/anticoagulation: Continue Subcutaneous heparin 5000 units every 12 hours until cleared by NSGY. Please initiate heparin education to family.             -antiplatelet therapy: N/A 3. Pain Management: Tylenol as needed 4. Impaired attention/initiation:   increase ritalin to 15mg  bid  -labwork ok             -antipsychotic agents: N/A 5. Neuropsych: This patient is not capable of making decisions on his own behalf. 6. Skin/Wound Care: Routine skin checks 7. Fluids/Electrolytes/Nutrition: Routine in and outs with follow-up chemistries 8.  Seizure prophylaxis.  keppra completed. 9.  Hypertension.  Norvasc 5 mg daily, Avapro 300 mg daily, HCTZ 25 mg daily.     -bp well controlled 10.  Hyponatremia.  6/30 sodium up to 132 -continue Sodium chloride tablets 2 g twice daily.   -1500 cc FR -continue to hold HCTZ   -recheck bmet Friday  11.  History of glaucoma.   Followed by Dr. Satira Sark.  Continue eyedrops as directed. 12.  History of asthma.  Continue inhalers as directed 13. Bradycardia: continue to monitor TID, normalized. 60's 14. Hypomagnesemia: magnesium normalized to 2.  15. Hypokalemia: K+ 4.3.  Continue supplementation  LOS: 8 days A FACE TO FACE EVALUATION WAS PERFORMED  [name] 04/29/2021, 10:15 AM

## 2021-04-29 NOTE — Progress Notes (Addendum)
Daily Progress Note   Patient Name: Shannon Horton.       Date: 04/29/2021 DOB: 1946-01-25  Age: 75 y.o. MRN#: [id number] Attending Physician: [name], MD Primary Care Physician: Laurey Morale, MD Admit Date: 04/21/2021 Length of Stay: 8 days  Reason for Consultation/Follow-up: Disposition  HPI/Patient Profile:  75 y.o. male  with past medical history of glaucoma, hypertension, tobacco use, and melanoma x2 removed from his right calf and left flank 2011 admitted on 04/21/2021 with multiple falls prior to admission, left hemiparesis, shuffling gait, and persistent headaches.  MRI of the brain showed a 3.8 cm enhancing and hemorrhagic mass in the right basal ganglia/frontal lobe region with a 5 mm left midline shift.  CT of the chest and abdomen was notable for left upper lobe pulmonary nodule, left hilar adenopathy, right upper lobe pulmonary nodule, left lower quadrant mass arising from the right adrenal gland, and hepatic lesions. CT-guided biopsy of the adrenal mass 03/30/2021 positive for malignancy.  Patient underwent right craniotomy resection of tumor 03/31/2021 per Dr. Reatha Armour.  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 Patient with ongoing left hemiparesis and visual deficits secondary to metastatic melanoma. Impaired mobility and cognition also continues. PMT consulted for [place] discussions. MOST and DNR have been completed and placed on the chart.  Current Medications: Scheduled Meds:   (feeding supplement) PROSource Plus  30 mL Oral Daily   amLODipine  5 mg Oral Daily   brinzolamide  1 drop Both Eyes BID   budesonide  0.25 mg Nebulization BID   dexamethasone  8 mg Oral Q12H   feeding supplement  237 mL Oral TID BM   heparin  5,000 Units Subcutaneous BID   irbesartan  300 mg Oral Daily   latanoprost  1 drop Both Eyes QHS   methylphenidate  15 mg Oral BID WC   multivitamin with minerals  1 tablet Oral Daily   pantoprazole  40 mg Oral Daily   potassium  chloride  20 mEq Oral BID   sodium chloride  2 g Oral BID WC    Continuous Infusions:   PRN Meds: acetaminophen, albuterol, docusate sodium, polyethylene glycol  Subjective:   Subjective: Chart Reviewed. Updates received. Patient Assessed. Created space and opportunity for patient  and family to explore thoughts and feelings regarding current medical situation.  Today's Discussion: I spoke with the patient. Still requiring significant cues, delayed processing. He denies pain, N/V. States he's eating. Overall "I'm not as well as I was" but can't specify why he doesn't feel well today. Patient's family typically here in the mornings, previous call/message with no call back. Will attempt to see the patient/his wife tomorrow morning.  Spoke with nursing staff Clifton James) who feels the patient has significantly declined in the past week (since he last had him). He notes decreased cognitive function (previously taking medications once at a time without issue, now requires significant cueing and delay). He's participating in therapy but also sleeping a lot more. Poor appetite as well. Overall feels like "he's not himself".   Review of Systems  Constitutional:  Positive for appetite change and fatigue.  Respiratory:  Negative for chest tightness and shortness of breath.   Cardiovascular:  Negative for chest pain.  Gastrointestinal:  Negative for abdominal pain, nausea and vomiting.   Objective:   Vital Signs: BP 92/61 (BP Location: Left Arm)   Pulse 68   Temp 98 F (36.7 C) (Oral)   Resp 16   Ht 5\' 8"  (  1.727 m)   Wt 66.8 kg   SpO2 99%   BMI 22.39 kg/m  SpO2: SpO2: 99 % O2 Device: O2 Device: Room Air O2 Flow Rate:    Physical Exam: Physical Exam Constitutional:      General: He is sleeping. He is not in acute distress.    Appearance: He is ill-appearing. He is not toxic-appearing.     Comments: Awakens easily to voice  Cardiovascular:     Rate and Rhythm: Normal rate.     Heart  sounds: Normal heart sounds.  Pulmonary:     Effort: Pulmonary effort is normal.     Breath sounds: Normal breath sounds.  Abdominal:     General: Abdomen is flat. There is no distension.     Palpations: Abdomen is soft.     Tenderness: There is no abdominal tenderness.  Skin:    General: Skin is warm and dry.  Neurological:     Comments: Delayed response, requiring significant cues.    SpO2: SpO2: 99 % O2 Device:SpO2: 99 % O2 Flow Rate: .   IO: Intake/output summary:  Intake/Output Summary (Last 24 hours) at 04/29/2021 1632 Last data filed at 04/29/2021 1330 Gross per 24 hour  Intake 537 ml  Output --  Net 537 ml    LBM: Last BM Date: 04/28/21 Baseline Weight: Weight: 67.1 kg Most recent weight: Weight: 66.8 kg   Palliative Assessment/Data:   Assessment & Plan:   Impression:  Pleasant male with malignant melanoma with hemorrhagic conversion s/p craniotomy who initially seemed to be doing well post-op. Now he appears to be declining in regards to cognitive function, decreased appetite, increased cues and delayed response. Participating in therapy, but more sleeping/napping. Nursing has noted a significant difference as per above. Overall seems to be failing to thrive. Neurology is trying pulse dexamethasone.  Goals are clear as noted in previous PMT notes. We need to be looking at d/c disposition. Need further discussions with family, especially given recent changes, to see what different discharge options would look like. Plan to try and meet in person with them in the morning.  SUMMARY OF RECOMMENDATIONS   Continue care based on goals/MOST form on the chart Continue DNR Meet/discuss with family for discussions on disposition: home vs SNF, palliative vs hospice  Code Status: DNR  Goals of Care/Recommendations: As previously documented: Cardiopulmonary Resuscitation: Do Not Attempt Resuscitation (DNR/No CPR)  Medical Interventions: Limited Additional Interventions:  Use medical treatment, IV fluids and cardiac monitoring as indicated, DO NOT USE intubation or mechanical ventilation. May consider use of less invasive airway support such as BiPAP or CPAP. Also provide comfort measures. Transfer to the hospital if indicated. Avoid intensive care.   Antibiotics: Antibiotics if indicated  IV Fluids: IV fluids if indicated  Feeding Tube: No feeding tube   Symptom Management: No PMT symptom management needs identified at this time Continue symptom management per attending  Prognosis: Unable to determine  Discharge Planning: To Be Determined  Thank you for allowing [place] to participate in the care of Shannon Horton. PMT will continue to support holistically.  Time Total: 45 min  Visit consisted of counseling and education dealing with the complex and emotionally intense issues of symptom management and palliative care in the setting of serious and potentially life-threatening illness. Greater than 50%  of this time was spent counseling and coordinating care related to the above assessment and plan.  In agreement with above assessment and plan , completed in collaboration with education  with Walden Field NP   Wadie Lessen NP  Walden Field, NP Palliative Medicine Team  Team Phone # [phone] (Nights/Weekends)  04/29/2021, 4:32 PM

## 2021-04-30 LAB — BASIC METABOLIC PANEL
Anion gap: 9 (ref 5–15)
BUN: 27 mg/dL — ABNORMAL HIGH (ref 8–23)
CO2: 25 mmol/L (ref 22–32)
Calcium: 9.8 mg/dL (ref 8.9–10.3)
Chloride: 100 mmol/L (ref 98–111)
Creatinine, Ser: 0.59 mg/dL — ABNORMAL LOW (ref 0.61–1.24)
GFR, Estimated: >60 mL/min (ref >60)
Glucose, Bld: 151 mg/dL — ABNORMAL HIGH (ref 70–99)
Potassium: 4.1 mmol/L (ref 3.5–5.1)
Sodium: 134 mmol/L — ABNORMAL LOW (ref 135–145)

## 2021-04-30 NOTE — Progress Notes (Signed)
Patient ID: Shannon Horton., male   DOB: [birth year], 75 y.o.   MRN: [id number]  SW returned phone call to [facility] [phone]) to inform d/c date will not likely be determined until next team conference on 7/5. States they do not want to schedule pt for any treatment until they are aware if family would like treatment or comfort care. SW informed will share with medical team, and ask co-worker to f/u once there is more information.   SW spoke with pt wife [name] ([phone]) to discuss family education. Fam edu scheduled for Wed (7/6) 9am-12pm; their son Quita Skye possibly to be present.   Loralee Pacas, MSW, [place] Office: [phone] Cell: [phone] Fax: [phone]

## 2021-04-30 NOTE — Progress Notes (Signed)
Speech Language Pathology Daily Session Note  Patient Details  Name: Shannon Horton. MRN: [id number] Date of Birth: 1946/02/27  Today's Date: 04/30/2021 SLP Individual Time: 1791-5056 SLP Individual Time Calculation (min): 45 min  Short Term Goals: Week 2: SLP Short Term Goal 1 (Week 2): Patient will consistently orient to place, time, and situation with Mod A verbal and visual cues SLP Short Term Goal 2 (Week 2): Patient will demonstrate susatained attention to functional tasks for 5 minutes with Mod verbal cues for redirection. SLP Short Term Goal 3 (Week 2): Patient will complete basic problem solving tasks with 50% accuracy and Mod A cues  Skilled Therapeutic Interventions: Skilled treatment session focused on cognitive goals. SLP facilitated session by providing Max A verbal cues for initiation and sustained attention to self-feeding with breakfast meal. Patient's wife called on his portal and patient required extra time and Max verbal cues for verbal initiation of responses. Patient was also disoriented to time and place and required Max verbal cues for utilization of external aids. Patient left upright in bed with alarm on and all needs within reach. Continue with current plan of care.      Pain No/Denies Pain   Therapy/Group: Individual Therapy  Jamiee Milholland 04/30/2021, 3:27 PM

## 2021-04-30 NOTE — Progress Notes (Signed)
Physical Therapy Session Note  Patient Details  Name: Shannon Horton. MRN: [id number] Date of Birth: 11-12-1945  Today's Date: 04/30/2021 PT Individual Time: 8864-8472 PT Individual Time Calculation (min): 28 min   Short Term Goals: Week 1:  PT Short Term Goal 1 (Week 1): Patient will perform basic transfers with mod A consistently. PT Short Term Goal 1 - Progress (Week 1): Met PT Short Term Goal 2 (Week 1): Patient will perform bed mobility with min A. PT Short Term Goal 2 - Progress (Week 1): Met PT Short Term Goal 3 (Week 1): Patient will perform sitting balance with mod A consistently. PT Short Term Goal 3 - Progress (Week 1): Progressing toward goal PT Short Term Goal 4 (Week 1): Patient will initiate gait training. PT Short Term Goal 4 - Progress (Week 1): Met  Skilled Therapeutic Interventions/Progress Updates: Pt presents semi-reclined in bed.  Pt required increased time to process.  Pt initiates scooting to EOB when prompted, but the requires mod A to complete transfer.  Pt sat EOB w/ slowly flexing trunk, requiring verbal cues and manual facilitation for upright posture.  Pt remains upright x <30 seconds.  Pt listing to left and is aware but unable to bring to midline sitting.  Pt required total A to don shoes at EOB.  Pt performed sit to stand transfers x 3 w/ mod A and HHA.  Pt able to stand x 30 seconds w/ verbal cues for upright posture.  Pt very fatigued this PM.  Pt returned to supine w/ mod A.  Bed alarm on and all needs in reach.  Spouse present during session.     Therapy Documentation Precautions:  Precautions Precautions: Fall Precaution Comments: R crani Restrictions Weight Bearing Restrictions: No General:   Vital Signs: Therapy Vitals Pulse Rate: 67 Resp: 16 BP: 109/81 Patient Position (if appropriate): Lying Oxygen Therapy SpO2: 100 % O2 Device: Room Air Pain: pt does not c/o pain.   Mobility:   Locomotion :      Therapy/Group: Individual  Therapy  Ladoris Gene 04/30/2021, 4:01 PM

## 2021-04-30 NOTE — Progress Notes (Signed)
Occupational Therapy Session Note  Patient Details  Name: Shannon Horton. MRN: [id number] Date of Birth: September 27, 1946  Today's Date: 04/30/2021 OT Individual Time: 2919-1660 OT Individual Time Calculation (min): 75 min    Short Term Goals: Week 1:  OT Short Term Goal 1 (Week 1): pt will don shirt in supported sitting with S OT Short Term Goal 1 - Progress (Week 1): Not progressing OT Short Term Goal 2 (Week 1): Pt will thread BLE into pants wiht no more than MIN A for sitting balance during supported sitting OT Short Term Goal 2 - Progress (Week 1): Not progressing OT Short Term Goal 3 (Week 1): Pt will transfer to toilet wiht MOD A OT Short Term Goal 3 - Progress (Week 1): Not progressing OT Short Term Goal 4 (Week 1): Pt will terminate oral care with no more than 2 VC OT Short Term Goal 4 - Progress (Week 1): Not progressing  Skilled Therapeutic Interventions/Progress Updates:     Pt received in bed with 0 out of 10 pain   ADL:  Pt completes bathing with seated on TIS shower chair that rolls into bathroom. Pt requires significant tilt back d/t poor midline orientation and sitting balance.  Pt completes UB dressing with MIN A to pull shirt down back at bed level Pt completes LB dressing with MOD A to pullpants up hips in bed rolling with MIN A using bed rails. Pt requires SIGNIFICANT time and cuing to process threading BLE at bed level with significant L Lean in supine/rolling onto L with threading BLE Pt completes footwear with set up at bed level  Pt completes shower/Tub transfer with MOD A squat pivot with 6" stool under feet.  Facilitated discussion with son, Quita Skye, and wife alice requesting they call palliative back to discuss level of care at home/prognosis/expectations  Pt left at end of session in bed with exit alarm on, call light in reach and all needs met   Therapy Documentation Precautions:  Precautions Precautions: Fall Precaution Comments: R  crani Restrictions Weight Bearing Restrictions: No General:   Vital Signs: Therapy Vitals Temp: 97.9 F (36.6 C) Temp Source: Oral Pulse Rate: 64 Resp: 16 BP: 126/75 Patient Position (if appropriate): Lying Oxygen Therapy SpO2: 96 % O2 Device: Room Air Pain:   ADL: ADL Eating: Set up Grooming: Minimal assistance Where Assessed-Grooming: Sitting at sink Upper Body Bathing: Minimal assistance Where Assessed-Upper Body Bathing: Wheelchair, Sitting at sink Lower Body Bathing: Moderate assistance Where Assessed-Lower Body Bathing: Wheelchair, Sitting at sink Upper Body Dressing: Minimal assistance Where Assessed-Upper Body Dressing: Sitting at sink, Wheelchair Lower Body Dressing: Moderate assistance Where Assessed-Lower Body Dressing: Sitting at sink, Standing at sink Vision   Perception    Praxis   Exercises:   Other Treatments:     Therapy/Group: Individual Therapy  Tonny Branch 04/30/2021, 6:56 AM

## 2021-04-30 NOTE — Progress Notes (Signed)
Daily Progress Note   Patient Name: Shannon Horton.        Date: 04/30/2021 DOB: August 03, 1946  Age: 75 y.o. MRN#: [id number] Attending Physician: [name], MD Primary Care Physician: Laurey Morale, MD Admit Date: 04/21/2021 Length of Stay: 9 days  Reason for Consultation/Follow-up: Disposition  HPI/Patient Profile:  75 y.o. male  with past medical history of glaucoma, hypertension, tobacco use, and melanoma x2 removed from his right calf and left flank 2011 admitted on 04/21/2021 with multiple falls prior to admission, left hemiparesis, shuffling gait, and persistent headaches.  MRI of the brain showed a 3.8 cm enhancing and hemorrhagic mass in the right basal ganglia/frontal lobe region with a 5 mm left midline shift.  CT of the chest and abdomen was notable for left upper lobe pulmonary nodule, left hilar adenopathy, right upper lobe pulmonary nodule, left lower quadrant mass arising from the right adrenal gland, and hepatic lesions. CT-guided biopsy of the adrenal mass 03/30/2021 positive for malignancy.  Patient underwent right craniotomy resection of tumor 03/31/2021 per Dr. Reatha Armour.  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 Patient with ongoing left hemiparesis and visual deficits secondary to metastatic melanoma. Impaired mobility and cognition also continues. PMT consulted for [place] discussions. MOST and DNR have been completed and placed on the chart.  Current Medications: Scheduled Meds:   (feeding supplement) PROSource Plus  30 mL Oral Daily   amLODipine  5 mg Oral Daily   brinzolamide  1 drop Both Eyes BID   budesonide  0.25 mg Nebulization BID   dexamethasone  8 mg Oral Q12H   feeding supplement  237 mL Oral TID BM   heparin  5,000 Units Subcutaneous BID   irbesartan  300 mg Oral Daily   latanoprost  1 drop Both Eyes QHS   methylphenidate  15 mg Oral BID WC   multivitamin with minerals  1 tablet Oral Daily   pantoprazole  40 mg Oral Daily    potassium chloride  20 mEq Oral BID   sodium chloride  2 g Oral BID WC    Continuous Infusions:   PRN Meds: acetaminophen, albuterol, docusate sodium, polyethylene glycol  Subjective:   Subjective: Chart Reviewed. Updates received. Patient Assessed. Created space and opportunity for patient  and family to explore thoughts and feelings regarding current medical situation.  Today's Discussion: We had a good discussion today focusing on disposition. We discussed that d/c needs are a bit murky right now pending patient progress with inpatient rehab.  We discussed that we often cannot accurately predict how patient will do in the long-term and while we hope for the best, we must also plan for the worst.  They admitted in his current situation they would not be able to provide care at home.  We discussed that he would likely need 24-hour care, which is not possible.  The patient currently lives with his wife who is 73 years old.  The son is local but lives in his own home.  We discussed that in the event that he does not make significant improvement that long-term nursing home may be appropriate.  We discussed possible options and they bring up [facility] (which they have experience with from other family members), [facility], and [facility].  The patient's wife and son indicate that they will contact these facilities to discuss their availability and appropriateness given the patient's current situation.  However, if he does improve and especially in a cognitive standpoint he could possibly feed  himself and participate in his own care, he may be appropriate for home with home health.  This would remain to be seen, and their impression from therapy is that he is not likely to make this type of recovery.  We also discussed that while surgery helped regain some function and get over the "bump in the road", that the malignant melanoma is still there.  Further treatment options to be determined by  oncology.  However, I feel that hospice care, or at least palliative care, as an outpatient either in the facility or at home would be appropriate for him.  I briefly mentioned this so that they can continue to think about this possibility.  ROS limited due to cognitive changes/disability Review of Systems  Cardiovascular:  Negative for chest pain.  Gastrointestinal:  Negative for abdominal pain, nausea and vomiting.   Objective:   Vital Signs: BP 109/81 (BP Location: Right Arm)   Pulse 67   Temp 97.9 F (36.6 C)   Resp 16   Ht 5\' 8"  (1.727 m)   Wt 66.8 kg   SpO2 100%   BMI 22.39 kg/m  SpO2: SpO2: 100 % O2 Device: O2 Device: Room Air O2 Flow Rate:    Physical Exam: Physical Exam Constitutional:      General: He is not in acute distress.    Appearance: He is normal weight. He is not toxic-appearing.  HENT:     Head: Normocephalic and atraumatic.  Pulmonary:     Effort: Pulmonary effort is normal. No respiratory distress.  Abdominal:     General: Abdomen is flat.     Palpations: Abdomen is soft.  Skin:    General: Skin is warm and dry.  Neurological:     Mental Status: He is alert.     Comments: Delay in processing, needs frequent cues, answers at times inappropriate/wandering  Psychiatric:        Mood and Affect: Mood normal.        Behavior: Behavior normal.    SpO2: SpO2: 100 % O2 Device:SpO2: 100 % O2 Flow Rate: .   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  He is undergoing inpatient rehab, although he seems to have had cognitive  decline over the past week.  Today he does seem a bit more awake and alert than he was yesterday.  Perhaps dexamethasone is helping.  However, he does still have significant physical and cognitive limitations.  Unsure of needed discharge disposition due to ongoing therapy.  However, in his current state his family expressed they could not care for him home.  They are exploring possible facilities for long-term care nursing home as discussed above.  I feel this would be appropriate.  We also mentioned that, given his melanoma still being present and not cured, further decisions would be made regarding treatment with oncology.  I also feel that palliative care or hospice care should be involved at his discharge to facilitate ongoing discussions and goals.  SUMMARY OF RECOMMENDATIONS   Continue DNR Goals of care as previously documented Begin  evaluating for possible discharge disposition/locations Family to investigate long-term nursing facilities for possible placement Take 1 day at a time and make decisions as needed Continued palliative medicine support  Code Status: DNR  Goals of Care/Recommendations: As previously documented in 04/23/21 note: Cardiopulmonary Resuscitation: Do Not Attempt Resuscitation (DNR/No CPR)  Medical Interventions: Limited Additional Interventions: Use medical treatment, IV fluids and cardiac monitoring as indicated, DO NOT USE intubation or mechanical ventilation. May consider use of less invasive airway support such as BiPAP or CPAP. Also provide comfort measures. Transfer to the hospital if indicated. Avoid intensive care.   Antibiotics: Antibiotics if indicated  IV Fluids: IV fluids if indicated  Feeding Tube: No feeding tube   Symptom Management: No palliative symptoms management needs identified at this time  Prognosis: Unable to determine  Discharge Planning: To Be Determined  Discussed with: Marguerita Merles, RN; Dorthula Nettles, RN (Case  Manager)  Thank you for allowing [place] to participate in the care of [name] PMT will continue to support holistically.  Time Total: 75 min  Visit consisted of counseling and education dealing with the complex and emotionally intense issues of symptom management and palliative care in the setting of serious and potentially life-threatening illness. Greater than 50%  of this time was spent counseling and coordinating care related to the above assessment and plan.  Walden Field, NP Palliative Medicine Team  Team Phone # [phone] (Nights/Weekends)  07/03/2670, 2:45 PM   Vinie Sill, NP Palliative Medicine Team Pager [phone] (Please see [email or web address] for schedule) Team Phone [phone]    Greater than 50%  of this time was spent counseling and coordinating care related to the above assessment and plan

## 2021-04-30 NOTE — Progress Notes (Addendum)
PROGRESS NOTE   Subjective/Complaints: In bed eating breakfast. Ate about 25% so far.  Appears comfortable, more alert  ROS: Limited due to cognitive/behavioral   Objective:   No results found. Recent Labs    04/28/21 0515  WBC 6.6  HGB 15.1  HCT 43.8  PLT 319    Recent Labs    04/29/21 0455 04/30/21 0500  NA 132* 134*  K 4.3 4.1  CL 98 100  CO2 26 25  GLUCOSE 121* 151*  BUN 28* 27*  CREATININE 0.74 0.59*  CALCIUM 9.7 9.8    Intake/Output Summary (Last 24 hours) at 04/30/2021 1027 Last data filed at 04/30/2021 0204 Gross per 24 hour  Intake 360 ml  Output 200 ml  Net 160 ml        Physical Exam: Vital Signs Blood pressure 126/75, pulse 70, temperature 97.9 F (36.6 C), resp. rate 18, height 5\' 8"  (1.727 m), weight 66.8 kg, SpO2 97 %. Constitutional: No distress . Vital signs reviewed. HEENT: EOMI, oral membranes moist Neck: supple Cardiovascular: RRR without murmur. No JVD    Respiratory/Chest: CTA Bilaterally without wheezes or rales. Normal effort    GI/Abdomen: BS +, non-tender, non-distended Ext: no clubbing, cyanosis, or edema Psych: delayed, distracted Skin: scattered bruising, NSL Neurological:    Comments: remains more alert but still very slow to process. Oriented to self only.  Moves all 4's. 4- to 4/5 strength throughout. Speech clearer when he phonates.   MSK: no pain today in trunk/limbs   Assessment/Plan: 1. Functional deficits which require 3+ hours per day of interdisciplinary therapy in a comprehensive inpatient rehab setting. Physiatrist is providing close team supervision and 24 hour management of active medical problems listed below. Physiatrist and rehab team continue to assess barriers to discharge/monitor patient progress toward functional and medical goals  Care Tool:  Bathing    Body parts bathed by patient: Right arm, Left arm, Chest, Abdomen, Right upper leg, Left  upper leg, Face   Body parts bathed by helper: Right lower leg, Left lower leg Body parts n/a: Buttocks, Front perineal area   Bathing assist Assist Level: Maximal Assistance - Patient 24 - 49% (for sitting balance seated at sink)     Upper Body Dressing/Undressing Upper body dressing   What is the patient wearing?: Pull over shirt    Upper body assist Assist Level: Minimal Assistance - Patient > 75%    Lower Body Dressing/Undressing Lower body dressing      What is the patient wearing?: Incontinence brief     Lower body assist Assist for lower body dressing: Maximal Assistance - Patient 25 - 49%     Toileting Toileting    Toileting assist Assist for toileting: Maximal Assistance - Patient 25 - 49% Assistive Device Comment: urinal   Transfers Chair/bed transfer  Transfers assist     Chair/bed transfer assist level: Moderate Assistance - Patient 50 - 74% Chair/bed transfer assistive device: Armrests   Locomotion Ambulation   Ambulation assist   Ambulation activity did not occur: Safety/medical concerns (patient unable to stand >5 sec with max A before LOB and total A to sit)  Assist level: Moderate Assistance - Patient 50 -  74% Assistive device: Ethelene Hal Max distance: 25 ft   Walk 10 feet activity   Assist  Walk 10 feet activity did not occur: Safety/medical concerns  Assist level: Moderate Assistance - Patient - 50 - 74% Assistive device: Walker-Eva   Walk 50 feet activity   Assist Walk 50 feet with 2 turns activity did not occur: Safety/medical concerns         Walk 150 feet activity   Assist Walk 150 feet activity did not occur: Safety/medical concerns         Walk 10 feet on uneven surface  activity   Assist Walk 10 feet on uneven surfaces activity did not occur: Safety/medical concerns         Wheelchair     Assist Will patient use wheelchair at discharge?: Yes (Per PT long term goals) Type of Wheelchair:  Manual Wheelchair activity did not occur: Safety/medical concerns (patient with significant forward lean and needed PT to block him in sitting for safety, used TIS w/c)         Wheelchair 50 feet with 2 turns activity    Assist    Wheelchair 50 feet with 2 turns activity did not occur: Safety/medical concerns       Wheelchair 150 feet activity     Assist  Wheelchair 150 feet activity did not occur: Safety/medical concerns       Blood pressure 126/75, pulse 70, temperature 97.9 F (36.6 C), resp. rate 18, height 5\' 8"  (1.727 m), weight 66.8 kg, SpO2 97 %.    Medical Problem List and Plan: 1.  Left hemiparesis with visual deficits secondary to metastatic melanoma status postcraniotomy for resection of right frontal tumor 03/31/2021.  Pathology positive for malignant melanoma             -patient may shower             -ELOS/Goals: MinA 10-14 days  -appreciate Dr. Renda Rolls consultation and recs   - continue pulse dose dexamethasone initiated as recommended for CNS inflammation/adrenal axis suppression. Resume 2mg  dexamethasone bid after pulse. There has been some improvement in arousal but ritalin was also increased. There is no clinical evidence of sz activity  -appreciate palliative care assistance. PMT to meet with family for further discussion. Family needs to discuss what level of care they can manage given his current level of function, prognosis, etc. Encouraged them to watch patient in therapies as well.  2.  Impaired mobility/s/p craniotomy: -DVT/anticoagulation: Continue Subcutaneous heparin 5000 units every 12 hours until cleared by NSGY.               -antiplatelet therapy: N/A 3. Pain Management: Tylenol as needed 4. Impaired attention/initiation:   continue ritalin 15mg  bid              -antipsychotic agents: N/A 5. Neuropsych: This patient is not capable of making decisions on his own behalf. 6. Skin/Wound Care: Routine skin checks 7.  Fluids/Electrolytes/Nutrition: Routine in and outs with follow-up chemistries 8.  Seizure prophylaxis.  keppra completed. 9.  Hypertension.  Norvasc 5 mg daily, Avapro 300 mg daily, HCTZ 25 mg daily.     -bp well controlled 10.  Hyponatremia.  7/1 sodium up to 134 -continue Sodium chloride tablets 2 g twice daily.   -1500 cc FR -continue to hold HCTZ      11.  History of glaucoma.  Followed by Dr. Satira Sark.  Continue eyedrops as directed. 12.  History of asthma.  Continue inhalers as directed 13.  Bradycardia: continue to monitor TID, normalized. 60's 14. Hypomagnesemia: magnesium normalized to 2 most recently.  15. Hypokalemia: K+ 4.1.  Continue supplementation  LOS: 9 days A FACE TO FACE EVALUATION WAS PERFORMED  [name] 04/30/2021, 10:27 AM

## 2021-05-01 ENCOUNTER — Inpatient Hospital Stay (HOSPITAL_COMMUNITY)
Admission: RE | Admit: 2021-05-01 | Discharge: 2021-05-09 | DRG: 054 | Disposition: A | Discharge status: Hospice | Payer: Medicare PPO | Source: Ambulatory Visit | Attending: Internal Medicine | Admitting: Internal Medicine

## 2021-05-01 ENCOUNTER — Inpatient Hospital Stay (HOSPITAL_COMMUNITY): Payer: Medicare PPO

## 2021-05-01 DIAGNOSIS — R410 Disorientation, unspecified: Secondary | ICD-10-CM | POA: Diagnosis not present

## 2021-05-01 DIAGNOSIS — G8194 Hemiplegia, unspecified affecting left nondominant side: Secondary | ICD-10-CM | POA: Diagnosis present

## 2021-05-01 DIAGNOSIS — Z87891 Personal history of nicotine dependence: Secondary | ICD-10-CM | POA: Diagnosis not present

## 2021-05-01 DIAGNOSIS — G319 Degenerative disease of nervous system, unspecified: Secondary | ICD-10-CM | POA: Diagnosis not present

## 2021-05-01 DIAGNOSIS — C7931 Secondary malignant neoplasm of brain: Principal | ICD-10-CM | POA: Diagnosis present

## 2021-05-01 DIAGNOSIS — C7800 Secondary malignant neoplasm of unspecified lung: Secondary | ICD-10-CM | POA: Diagnosis not present

## 2021-05-01 DIAGNOSIS — R4182 Altered mental status, unspecified: Secondary | ICD-10-CM | POA: Diagnosis not present

## 2021-05-01 DIAGNOSIS — Z91018 Allergy to other foods: Secondary | ICD-10-CM | POA: Diagnosis not present

## 2021-05-01 DIAGNOSIS — Z66 Do not resuscitate: Secondary | ICD-10-CM | POA: Diagnosis not present

## 2021-05-01 DIAGNOSIS — R29818 Other symptoms and signs involving the nervous system: Secondary | ICD-10-CM | POA: Diagnosis not present

## 2021-05-01 DIAGNOSIS — C7970 Secondary malignant neoplasm of unspecified adrenal gland: Secondary | ICD-10-CM | POA: Diagnosis not present

## 2021-05-01 DIAGNOSIS — I629 Nontraumatic intracranial hemorrhage, unspecified: Secondary | ICD-10-CM

## 2021-05-01 DIAGNOSIS — Z8371 Family history of colonic polyps: Secondary | ICD-10-CM | POA: Diagnosis not present

## 2021-05-01 DIAGNOSIS — H409 Unspecified glaucoma: Secondary | ICD-10-CM | POA: Diagnosis present

## 2021-05-01 DIAGNOSIS — C799 Secondary malignant neoplasm of unspecified site: Secondary | ICD-10-CM | POA: Diagnosis not present

## 2021-05-01 DIAGNOSIS — Z833 Family history of diabetes mellitus: Secondary | ICD-10-CM

## 2021-05-01 DIAGNOSIS — G9389 Other specified disorders of brain: Secondary | ICD-10-CM | POA: Diagnosis not present

## 2021-05-01 DIAGNOSIS — G9341 Metabolic encephalopathy: Secondary | ICD-10-CM | POA: Diagnosis present

## 2021-05-01 DIAGNOSIS — R54 Age-related physical debility: Secondary | ICD-10-CM | POA: Diagnosis present

## 2021-05-01 DIAGNOSIS — I10 Essential (primary) hypertension: Secondary | ICD-10-CM

## 2021-05-01 DIAGNOSIS — E871 Hypo-osmolality and hyponatremia: Secondary | ICD-10-CM | POA: Diagnosis present

## 2021-05-01 DIAGNOSIS — E222 Syndrome of inappropriate secretion of antidiuretic hormone: Secondary | ICD-10-CM | POA: Diagnosis present

## 2021-05-01 DIAGNOSIS — E440 Moderate protein-calorie malnutrition: Secondary | ICD-10-CM | POA: Diagnosis not present

## 2021-05-01 DIAGNOSIS — I69222 Dysarthria following other nontraumatic intracranial hemorrhage: Secondary | ICD-10-CM | POA: Diagnosis not present

## 2021-05-01 DIAGNOSIS — D181 Lymphangioma, any site: Secondary | ICD-10-CM | POA: Diagnosis not present

## 2021-05-01 DIAGNOSIS — I615 Nontraumatic intracerebral hemorrhage, intraventricular: Secondary | ICD-10-CM

## 2021-05-01 DIAGNOSIS — Z515 Encounter for palliative care: Secondary | ICD-10-CM | POA: Diagnosis not present

## 2021-05-01 DIAGNOSIS — Z79899 Other long term (current) drug therapy: Secondary | ICD-10-CM | POA: Diagnosis not present

## 2021-05-01 DIAGNOSIS — Z8249 Family history of ischemic heart disease and other diseases of the circulatory system: Secondary | ICD-10-CM

## 2021-05-01 DIAGNOSIS — Z7951 Long term (current) use of inhaled steroids: Secondary | ICD-10-CM | POA: Diagnosis not present

## 2021-05-01 DIAGNOSIS — R569 Unspecified convulsions: Secondary | ICD-10-CM | POA: Diagnosis present

## 2021-05-01 DIAGNOSIS — I639 Cerebral infarction, unspecified: Secondary | ICD-10-CM | POA: Diagnosis not present

## 2021-05-01 DIAGNOSIS — Z7189 Other specified counseling: Secondary | ICD-10-CM | POA: Diagnosis not present

## 2021-05-01 DIAGNOSIS — C711 Malignant neoplasm of frontal lobe: Secondary | ICD-10-CM | POA: Diagnosis not present

## 2021-05-01 DIAGNOSIS — F05 Delirium due to known physiological condition: Secondary | ICD-10-CM | POA: Diagnosis not present

## 2021-05-01 DIAGNOSIS — G9340 Encephalopathy, unspecified: Secondary | ICD-10-CM | POA: Diagnosis not present

## 2021-05-01 DIAGNOSIS — Z801 Family history of malignant neoplasm of trachea, bronchus and lung: Secondary | ICD-10-CM

## 2021-05-01 DIAGNOSIS — I619 Nontraumatic intracerebral hemorrhage, unspecified: Secondary | ICD-10-CM | POA: Diagnosis not present

## 2021-05-01 LAB — GLUCOSE, CAPILLARY: Glucose-Capillary: 183 mg/dL — ABNORMAL HIGH (ref 70–99)

## 2021-05-01 LAB — AMMONIA: Ammonia: 19 umol/L (ref 9–35)

## 2021-05-01 MED ORDER — ALBUTEROL SULFATE (2.5 MG/3ML) 0.083% IN NEBU: 2.5000 mg | INHALATION_SOLUTION | RESPIRATORY_TRACT | Status: DC | PRN

## 2021-05-01 MED ORDER — HYDROCODONE-ACETAMINOPHEN 5-325 MG PO TABS
1.0000 | ORAL_TABLET | ORAL | Status: DC | PRN
  Administered 2021-05-02: 1 via ORAL
  Filled 2021-05-01: qty 1

## 2021-05-01 MED ORDER — IRBESARTAN 300 MG PO TABS: 300.0000 mg | ORAL_TABLET | Freq: Every day | ORAL | Status: DC

## 2021-05-01 MED ORDER — SODIUM CHLORIDE 0.9 % NICU IV INFUSION SIMPLE
INJECTION | INTRAVENOUS | Status: DC
  Filled 2021-05-01 (×2): qty 500

## 2021-05-01 MED ORDER — DEXAMETHASONE 4 MG PO TABS: 2.0000 mg | ORAL_TABLET | Freq: Two times a day (BID) | ORAL | Status: DC

## 2021-05-01 MED ORDER — ALBUTEROL SULFATE HFA 108 (90 BASE) MCG/ACT IN AERS: 2.0000 | INHALATION_SPRAY | RESPIRATORY_TRACT | Status: DC | PRN

## 2021-05-01 MED ORDER — GADOBUTROL 1 MMOL/ML IV SOLN
6.5000 mL | Freq: Once | INTRAVENOUS | Status: AC | PRN
  Administered 2021-05-01: 6.5 mL via INTRAVENOUS

## 2021-05-01 MED ORDER — AMLODIPINE BESYLATE 5 MG PO TABS
5.0000 mg | ORAL_TABLET | Freq: Every day | ORAL | Status: DC
  Administered 2021-05-02: 5 mg via ORAL
  Filled 2021-05-01: qty 1

## 2021-05-01 MED ORDER — LEVETIRACETAM 250 MG PO TABS
250.0000 mg | ORAL_TABLET | Freq: Two times a day (BID) | ORAL | Status: DC
  Administered 2021-05-01 – 2021-05-02 (×2): 250 mg via ORAL
  Filled 2021-05-01 (×2): qty 1

## 2021-05-01 MED ORDER — LORAZEPAM 2 MG/ML IJ SOLN
INTRAMUSCULAR | Status: AC
  Filled 2021-05-01: qty 1

## 2021-05-01 MED ORDER — ACETAMINOPHEN 325 MG PO TABS: 650.0000 mg | ORAL_TABLET | ORAL | Status: DC | PRN

## 2021-05-01 MED ORDER — HYDRALAZINE HCL 20 MG/ML IJ SOLN: 5.0000 mg | Freq: Four times a day (QID) | INTRAMUSCULAR | Status: DC | PRN

## 2021-05-01 MED ORDER — TELMISARTAN-HCTZ 80-25 MG PO TABS: 1.0000 | ORAL_TABLET | Freq: Every day | ORAL | Status: DC

## 2021-05-01 MED ORDER — SENNOSIDES-DOCUSATE SODIUM 8.6-50 MG PO TABS: 1.0000 | ORAL_TABLET | Freq: Every evening | ORAL | Status: DC | PRN

## 2021-05-01 MED ORDER — LORAZEPAM 2 MG/ML IJ SOLN
1.0000 mg | Freq: Once | INTRAMUSCULAR | Status: AC
  Administered 2021-05-01: 1 mg via INTRAVENOUS

## 2021-05-01 MED ORDER — SODIUM CHLORIDE 0.9 % IV SOLN: INTRAVENOUS | Status: AC

## 2021-05-01 MED ORDER — STROKE: EARLY STAGES OF RECOVERY BOOK
Freq: Once | Status: DC
  Filled 2021-05-01: qty 1

## 2021-05-01 MED ORDER — ONDANSETRON HCL 4 MG/2ML IJ SOLN
4.0000 mg | Freq: Once | INTRAMUSCULAR | Status: AC
  Administered 2021-05-01: 4 mg via INTRAVENOUS
  Filled 2021-05-01: qty 2

## 2021-05-01 MED ORDER — SODIUM CHLORIDE 1 G PO TABS
2.0000 g | ORAL_TABLET | Freq: Two times a day (BID) | ORAL | Status: DC
  Administered 2021-05-02 – 2021-05-05 (×7): 2 g via ORAL
  Filled 2021-05-01 (×8): qty 2

## 2021-05-01 MED ORDER — BRINZOLAMIDE 1 % OP SUSP
1.0000 [drp] | Freq: Two times a day (BID) | OPHTHALMIC | Status: DC
  Administered 2021-05-01 – 2021-05-09 (×16): 1 [drp] via OPHTHALMIC
  Filled 2021-05-01: qty 10

## 2021-05-01 MED ORDER — ACETAMINOPHEN 650 MG RE SUPP: 650.0000 mg | RECTAL | Status: DC | PRN

## 2021-05-01 MED ORDER — METHYLPHENIDATE HCL 5 MG PO TABS
5.0000 mg | ORAL_TABLET | Freq: Two times a day (BID) | ORAL | Status: DC
  Administered 2021-05-02 – 2021-05-09 (×16): 5 mg via ORAL
  Filled 2021-05-01 (×16): qty 1

## 2021-05-01 MED ORDER — DEXAMETHASONE 2 MG PO TABS
2.0000 mg | ORAL_TABLET | Freq: Two times a day (BID) | ORAL | Status: DC
  Administered 2021-05-01: 2 mg via ORAL
  Filled 2021-05-01 (×2): qty 1

## 2021-05-01 MED ORDER — HYDROCHLOROTHIAZIDE 25 MG PO TABS
25.0000 mg | ORAL_TABLET | Freq: Every day | ORAL | Status: DC
  Administered 2021-05-02: 25 mg via ORAL
  Filled 2021-05-01: qty 1

## 2021-05-01 MED ORDER — BUDESONIDE 0.5 MG/2ML IN SUSP
0.5000 mg | Freq: Two times a day (BID) | RESPIRATORY_TRACT | Status: DC
  Administered 2021-05-02 – 2021-05-09 (×14): 0.5 mg via RESPIRATORY_TRACT
  Filled 2021-05-01 (×14): qty 2

## 2021-05-01 MED ORDER — ACETAMINOPHEN 160 MG/5ML PO SOLN: 650.0000 mg | ORAL | Status: DC | PRN

## 2021-05-01 MED ORDER — LATANOPROST 0.005 % OP SOLN
1.0000 [drp] | Freq: Every day | OPHTHALMIC | Status: DC
  Administered 2021-05-01 – 2021-05-08 (×8): 1 [drp] via OPHTHALMIC
  Filled 2021-05-01: qty 2.5

## 2021-05-01 MED ORDER — PANTOPRAZOLE SODIUM 40 MG PO TBEC
40.0000 mg | DELAYED_RELEASE_TABLET | Freq: Every day | ORAL | Status: DC
  Administered 2021-05-01 – 2021-05-06 (×6): 40 mg via ORAL
  Filled 2021-05-01 (×6): qty 1

## 2021-05-01 NOTE — Progress Notes (Signed)
   05/01/21 1753  Vitals  Temp 98.2 F (36.8 C)  Temp Source Oral  BP 127/80  MAP (mmHg) 93  BP Location Right Arm  BP Method Automatic  Patient Position (if appropriate) Lying  Pulse Rate 80  Pulse Rate Source Monitor  ECG Heart Rate 80  Resp 18  MEWS COLOR  MEWS Score Color Green  Oxygen Therapy  SpO2 100 %  Pain Assessment  Pain Scale Faces  Faces Pain Scale 0  MEWS Score  MEWS Temp 0  MEWS Systolic 0  MEWS Pulse 0  MEWS RR 0  MEWS LOC 0  MEWS Score 0   Admitted from rehab, A&O X 1, unable to complete admission profile. Family notified and updated.

## 2021-05-01 NOTE — H&P (Signed)
History and Physical    [name] JXB:[id number] DOB: 1945-12-20 DOA: 05/01/2021  PCP: Laurey Morale, MD (Confirm with patient/family/NH records and if not entered, this has to be entered at [facility] point of entry) Patient coming from: Rehab unit  I have personally briefly reviewed patient's old medical records in [place]  Chief Complaint: AMS  HPI: Shannon Horton. is a 75 y.o. 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  This afternoon, both during rehab, patient suddenly collapsed and became unresponsive.  Code stroke was called, CT head showed new intracranial bleeding of a 5 mm area of hypodensity in the right temporal lobe.  Nursing reported the patient had initial right-sided gaze preference, and the right-sided facial droop with left hemiparesis.  At the time I evaluated the patient, patient lethargic and sleepy, did not talk to patient son at bedside who reported patient mentation not back to his baseline.    Review of Systems: Unable to perform, patient sleepy and lethargic.  Past Medical History:  Diagnosis Date   Allergic rhinitis    gets shots per Dr. Velora Heckler   Allergy    sees Dr. Harold Hedge    Asthma    Glaucoma    sees Dr. Satira Sark    Hypertension    Malignant melanoma [facility])    right lower leg, diagnosed on1/21/11    Past Surgical History:  Procedure Laterality Date   APPLICATION OF CRANIAL NAVIGATION N/A 03/31/2021   Procedure: APPLICATION OF CRANIAL NAVIGATION;  Surgeon: Karsten Ro, DO;  Location: [place];  Service: Neurosurgery;  Laterality: N/A;   CATARACT EXTRACTION     right ey, Dr. Satira Sark    COLONOSCOPY  12/15/2015   per Dr.Kaplan, clear, no repeats    CRANIOTOMY N/A 03/31/2021   Procedure: RIGHT FRONTAL CRANIOTOMY FOR TUMOR;   Surgeon: Karsten Ro, DO;  Location: [place];  Service: Neurosurgery;  Laterality: N/A;   ESOPHAGOGASTRODUODENOSCOPY     WITH ESOPHAGEAL DILATION 2006 PER dR. kAPLAN   REMOVAL  of melanoma  12/08/09   from right calf; per Dr. Monica Becton   removal of melanoma from left abdomen  01/21/10    per Dr. Denice Bors CELL CARCINOMA EXCISION  2013   per Dr. Terrence Dupont, from top of scalp      reports that he quit smoking about 30 years ago. His smoking use included cigarettes. He has never used smokeless tobacco. He reports current alcohol use of about 5.0 - 10.0 standard drinks of alcohol per week. He reports that he does not use drugs.  Allergies  Allergen Reactions   Banana Rash    Family History  Problem Relation Age of Onset   Hypertension Other    Colon polyps Mother    Hypertension Mother    Diabetes Father    Liver disease Maternal Uncle    Cancer Brother        throat and lung (smoker)   Colon cancer Neg Hx    Stomach cancer Neg Hx    Esophageal cancer Neg Hx    Pancreatic cancer Neg Hx      Prior to Admission medications   Medication Sig Start Date End Date Taking? Authorizing Provider  albuterol (PROVENTIL HFA;VENTOLIN HFA) 108 (90 Base) MCG/ACT inhaler Inhale 2 puffs into the  lungs every 4 (four) hours as needed for wheezing or shortness of breath. 12/05/17   Burchette, Alinda Sierras, MD  amLODipine (NORVASC) 5 MG tablet Take 1 tablet (5 mg total) by mouth daily. 09/10/20   Laurey Morale, MD  bimatoprost (LUMIGAN) 0.03 % ophthalmic solution Place 1 drop into both eyes at bedtime.    [provider]  brinzolamide (AZOPT) 1 % ophthalmic suspension Place 1 drop into both eyes 2 (two) times daily.    [provider]  dexamethasone (DECADRON) 1 MG tablet Take 2 tablets (2 mg total) by mouth every 12 (twelve) hours. 04/21/21   Dawley, Troy C, DO  Fluticasone Furoate (ARNUITY ELLIPTA) 200 MCG/ACT AEPB Inhale 1 puff into the lungs daily.    [provider]   HYDROcodone-acetaminophen (NORCO/VICODIN) 5-325 MG tablet Take 1 tablet by mouth every 4 (four) hours as needed for moderate pain. 04/06/21   Dawley, Troy C, DO  levETIRAcetam (KEPPRA) 250 MG tablet Take 1 tablet (250 mg total) by mouth 2 (two) times daily. 04/21/21   Dawley, Pieter Partridge C, DO  methylphenidate (RITALIN) 5 MG tablet Take 1 tablet (5 mg total) by mouth 2 (two) times daily with breakfast and lunch. 04/22/21   Dawley, Troy C, DO  pantoprazole (PROTONIX) 40 MG tablet Take 1 tablet (40 mg total) by mouth at bedtime. 04/06/21   Dawley, Troy C, DO  sodium chloride 1 g tablet Take 2 tablets (2 g total) by mouth 2 (two) times daily with a meal. 04/21/21   Dawley, Troy C, DO  telmisartan-hydrochlorothiazide (MICARDIS HCT) 80-25 MG tablet TAKE 1 TABLET BY MOUTH EVERY DAY 07/22/20   Laurey Morale, MD    Physical Exam: Vitals:   05/01/21 1747 05/01/21 1753  BP:  127/80  Pulse:  80  Resp:  18  Temp: 98.2 F (36.8 C) 98.2 F (36.8 C)  TempSrc: Axillary Oral  SpO2:  100%  Weight: 63.5 kg     Constitutional: NAD, calm, comfortable Vitals:   05/01/21 1747 05/01/21 1753  BP:  127/80  Pulse:  80  Resp:  18  Temp: 98.2 F (36.8 C) 98.2 F (36.8 C)  TempSrc: Axillary Oral  SpO2:  100%  Weight: 63.5 kg    Eyes: PERRL, lids and conjunctivae normal ENMT: Mucous membranes are moist. Posterior pharynx clear of any exudate or lesions.Normal dentition.  Neck: normal, supple, no masses, no thyromegaly Respiratory: clear to auscultation bilaterally, no wheezing, no crackles. Normal respiratory effort. No accessory muscle use.  Cardiovascular: Regular rate and rhythm, no murmurs / rubs / gallops. No extremity edema. 2+ pedal pulses. No carotid bruits.  Abdomen: no tenderness, no masses palpated. No hepatosplenomegaly. Bowel sounds positive.  Musculoskeletal: no clubbing / cyanosis. No joint deformity upper and lower extremities. Good ROM, no contractures. Normal muscle tone.  Skin: no rashes, lesions,  ulcers. No induration Neurologic: Arousable, moving all limbs, not following commands Psychiatric: Arousable    Labs on Admission: I have personally reviewed following labs and imaging studies  CBC: Recent Labs  Lab 04/28/21 0515  WBC 6.6  HGB 15.1  HCT 43.8  MCV 90.5  PLT 941   Basic Metabolic Panel: Recent Labs  Lab 04/25/21 0638 04/28/21 0515 04/29/21 0455 04/30/21 0500  NA 131* 130* 132* 134*  K 4.3 4.2 4.3 4.1  CL 97* 95* 98 100  CO2 27 25 26 25   GLUCOSE 122* 136* 121* 151*  BUN 13 20 28* 27*  CREATININE 0.59* 0.61 0.74 0.59*  CALCIUM 9.3  9.9 9.7 9.8   GFR: Estimated Creatinine Clearance: 71.7 mL/min (A) (by C-G formula based on SCr of 0.59 mg/dL (L)). Liver Function Tests: No results for input(s): AST, ALT, ALKPHOS, BILITOT, PROT, ALBUMIN in the last 168 hours. No results for input(s): LIPASE, AMYLASE in the last 168 hours. Recent Labs  Lab 05/01/21 1557  AMMONIA 19   Coagulation Profile: No results for input(s): INR, PROTIME in the last 168 hours. Cardiac Enzymes: No results for input(s): CKTOTAL, CKMB, CKMBINDEX, TROPONINI in the last 168 hours. BNP (last 3 results) No results for input(s): PROBNP in the last 8760 hours. HbA1C: No results for input(s): HGBA1C in the last 72 hours. CBG: Recent Labs  Lab 05/01/21 1451  GLUCAP 183*   Lipid Profile: No results for input(s): CHOL, HDL, LDLCALC, TRIG, CHOLHDL, LDLDIRECT in the last 72 hours. Thyroid Function Tests: No results for input(s): TSH, T4TOTAL, FREET4, T3FREE, THYROIDAB in the last 72 hours. Anemia Panel: No results for input(s): VITAMINB12, FOLATE, FERRITIN, TIBC, IRON, RETICCTPCT in the last 72 hours. Urine analysis:    Component Value Date/Time   COLORURINE YELLOW (A) 04/27/2021 1455   APPEARANCEUR CLOUDY (A) 04/27/2021 1455   LABSPEC 1.013 04/27/2021 1455   PHURINE 7.0 04/27/2021 1455   GLUCOSEU NEGATIVE 04/27/2021 1455   GLUCOSEU NEGATIVE 03/24/2021 1526   HGBUR NEGATIVE  04/27/2021 1455   HGBUR negative 09/14/2010 0844   BILIRUBINUR NEGATIVE 04/27/2021 1455   BILIRUBINUR neg 12/25/2019 [place] 04/27/2021 1455   PROTEINUR NEGATIVE 04/27/2021 1455   UROBILINOGEN 1.0 03/24/2021 1526   NITRITE NEGATIVE 04/27/2021 1455   LEUKOCYTESUR NEGATIVE 04/27/2021 1455    Radiological Exams on Admission: EEG adult  Result Date: 05/01/2021 Lora Havens, MD     05/01/2021  5:16 PM Patient Name: [name] MRN: [id number] Epilepsy Attending: Lora Havens Referring Physician/Provider: Anibal Henderson, NP Date: 05/01/2021 Duration: 22.25 mins Patient history: 75yo M with acute unresponsiveness, right facial weakness, left hemiparesis. EEG to evaluate for seizure Level of alertness:  lethargic AEDs during EEG study: Ativan, LEV Technical aspects: This EEG study was done with scalp electrodes positioned according to the 10-20 International system of electrode placement. Electrical activity was acquired at a sampling rate of 500Hz  and reviewed with a high frequency filter of 70Hz  and a low frequency filter of 1Hz . EEG data were recorded continuously and digitally stored. Description: EEG showed continuous generalized and maximal right frontal polymorphic mixed frequencies with predominantly 5 to 6 Hz theta as well as intermittent 2-3Hz  delta slowing admixed with 15-18Hz  generalized beta activity. Hyperventilation and photic stimulation were not performed.   ABNORMALITY - Continuous slow, generalized and maximal right frontal IMPRESSION: This study is suggestive of cortical dysfunction arising from left frontal region, likely secondary to underlying structural abnormality. Additionally there is moderate to severe diffuse encephalopathy, non specific etiology. No seizures or definite epileptiform discharges were seen throughout the recording. Lora Havens   CT HEAD CODE STROKE WO CONTRAST  Result Date: 05/01/2021 CLINICAL DATA:  Code stroke. EXAM: CT HEAD  WITHOUT CONTRAST TECHNIQUE: Contiguous axial images were obtained from the base of the skull through the vertex without intravenous contrast. COMPARISON:  CT 04/18/2021, MRI 04/19/2021 FINDINGS: Brain: There is a new 5 mm area of hyperdensity of the right temporal lobe (series 3, image 16). Punctate area of corresponding enhancement is noted on the prior MRI. Evolving postoperative changes in the right lentiform nucleus region. Adjacent low-attenuation extending to the right frontal horn likely corresponds  to area of subacute infarction on MRI. Ill-defined hyperdensity could reflect petechial hemorrhage. Stable left choroid plexus lesion. Small volume residual hemorrhage within the occipital horns. Thin subdural hygroma along the right falx has increased since the MRI. No new loss of gray-white differentiation. Prominence of the ventricles and sulci reflects generalized cerebral and cerebellar parenchymal volume loss. Superimposed communicating hydrocephalus is not excluded. Vascular: No hyperdense vessel. Skull: Right frontal craniotomy. Sinuses/Orbits: No acute abnormality. Other: Mastoid air cells are clear. IMPRESSION: New 5 mm area of hyperdensity in the right temporal lobe. There is punctate corresponding enhancement on the prior MRI and this may reflect hemorrhage associated with a metastasis. Small volume residual hemorrhage within the occipital horns. Increased thin subdural hygroma along the right falx. Evolving postoperative changes and adjacent subacute infarction. Initial results were communicated to Dr. Rory Percy at 3:21 pm on 05/01/2021 by text page via the [facility] messaging system. Electronically Signed   By: Macy Mis M.D.   On: 05/01/2021 15:29    EKG: Ordered  Assessment/Plan Active Problems:   IVH (intraventricular hemorrhage) (HCC)   Intracranial hemorrhage (HCC)  (please populate well all problems here in Problem List. (For example, if patient is on BP meds at home and you resume or decide  to hold them, it is a problem that needs to be her. Same for CAD, COPD, HLD and so on)  Acute metabolic encephalopathy secondary to recurrent intracranial bleeding -D/W neurosurgery Dr. Alroy Dust, who ordered brain MRI and neurology also on board.  Prognosis poor, patient is DNR, discussed patient condition with patient's son at bedside, all questions answered with my best knowledge -EKG pending to rule out breakthrough seizure, continue Keppra -Resume home BP meds if able to take, also ordered as needed hydralazine for blood pressure 140/95. -SCD for DVT prophylaxis -Hold antiplatelet. -Admit to neuro stepdown unit. -Slow hydration with normal saline 50 mL/h for 1 day to prevent dehydration. -Continue Keppra  HTN -Home BP meds plus as needed hydralazine  Hyponatremia -Continue sodium tablet.  Asthma -Stable  Seizure -Continue Keppra  GI prophylaxis -Continue PPI  DVT prophylaxis: SCD Code Status: DNR Family Communication: Son at bedside Disposition Plan: Expect more than 3 days hospital stay, overall prognosis poor given recurrent intracranial bleed and new metastatic lesions.  Family aware. Consults called: Neurology and neurosurgery Admission status: PCU   Lequita Halt MD [facility] Pager [phone]  05/01/2021, 6:10 PM

## 2021-05-01 NOTE — Consult Note (Signed)
Neurology Consultation  Reason for Consult: Acute unresponsiveness, right facial weakness, left hemiparesis Referring Physician: Dr. Naaman Plummer  CC: Decreased level of consciousness  History is obtained from: Patient, Patient's son at bedside, rapid response RN, bedside RN, Chart review  HPI: Shannon Horton. is a 75 y.o. male with a medical history significant for hypertension, tobacco use, and malignant melanoma initially removed from the right calf and left flank in 2011 with metastasis to the brain, right adrenal gland, and lung found in May of 2022. He was evaluated in May of 2022 for persistent headaches, balance disturbance, and left hemiparesis when an MRI brain showed enhancing and hemorrhagic mass in the right basal ganglia/frontal lobe region with a 5 mm leftward midline shift. He underwent a right frontal craniotomy for tumor resection on 03/31/2021 and was in CIR. 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 He has had fluctuating levels of alertness and participation while in rehab and has remained on Decadron and Keppra for seizure prophylaxis. Today, the patient was working with OT when he suddenly became unresponsive, falling back into the bed, with an initial right gaze preference, was leaning rightward with a right facial droop, and had left hemiparesis. A Code Stroke was activated for neurology evaluation at that time.   During evaluation, [name]'s level of consciousness ranged from drowsy to alert with varying degrees of participation in evaluation. He vomited 3 times in route from CT back to his inpatient room.   LKW: 14:50 tpa given?: no, recent ventricular hemorrhage, concern for hemorrhagic metastatic brain lesions IR Thrombectomy? No, patient presentation is not consistent with LVO, new cerebral hemorrhage  identified on CT head.  Modified Rankin Scale: 5-Severe disability-bedridden, incontinent, needs constant attention  ROS: A complete ROS was performed and is negative except as noted in the HPI.  Past Medical History:  Diagnosis Date   Allergic rhinitis    gets shots per Dr. Velora Heckler   Allergy    sees Dr. Harold Hedge    Asthma    Glaucoma    sees Dr. Satira Sark    Hypertension    Malignant melanoma [facility])    right lower leg, diagnosed on1/21/11   Past Surgical History:  Procedure Laterality Date   APPLICATION OF CRANIAL NAVIGATION N/A 03/31/2021   Procedure: APPLICATION OF CRANIAL NAVIGATION;  Surgeon: Karsten Ro, DO;  Location: [place];  Service: Neurosurgery;  Laterality: N/A;   CATARACT EXTRACTION     right ey, Dr. Satira Sark    COLONOSCOPY  12/15/2015   per Dr.Kaplan, clear, no repeats    CRANIOTOMY N/A 03/31/2021   Procedure: RIGHT FRONTAL CRANIOTOMY FOR TUMOR;  Surgeon: Karsten Ro, DO;  Location: [place];  Service: Neurosurgery;  Laterality: N/A;   ESOPHAGOGASTRODUODENOSCOPY     WITH ESOPHAGEAL DILATION 2006 PER dR. kAPLAN   REMOVAL  of melanoma  12/08/09   from right calf; per Dr. Monica Becton   removal of melanoma from left abdomen  01/21/10    per Dr. Denice Bors CELL CARCINOMA EXCISION  2013   per Dr. Terrence Dupont, from top of scalp    Family History  Problem Relation Age of Onset   Hypertension Other    Colon polyps Mother    Hypertension Mother    Diabetes Father    Liver disease Maternal Uncle    Cancer Brother  throat and lung (smoker)   Colon cancer Neg Hx    Stomach cancer Neg Hx    Esophageal cancer Neg Hx    Pancreatic cancer Neg Hx    Social History:   reports that he quit smoking about 30 years ago. His smoking use included cigarettes. He has never used smokeless tobacco. He reports current alcohol use of about 5.0 - 10.0 standard drinks of alcohol per week. He reports that he does not use drugs.  Medications  Current Facility-Administered  Medications:    (feeding supplement) PROSource Plus liquid 30 mL, 30 mL, Oral, Daily, [name] T, MD, 30 mL at 05/01/21 0853   acetaminophen (TYLENOL) tablet 650 mg, 650 mg, Oral, Q4H PRN, Cathlyn Parsons, PA-C, 650 mg at 04/26/21 1404   albuterol (PROVENTIL) (2.5 MG/3ML) 0.083% nebulizer solution 2.5 mg, 2.5 mg, Nebulization, Q4H PRN, [name], MD   amLODipine (NORVASC) tablet 5 mg, 5 mg, Oral, Daily, Angiulli, Daniel J, PA-C, 5 mg at 05/01/21 0853   brinzolamide (AZOPT) 1 % ophthalmic suspension 1 drop, 1 drop, Both Eyes, BID, [name] T, MD, 1 drop at 05/01/21 0907   budesonide (PULMICORT) nebulizer solution 0.25 mg, 0.25 mg, Nebulization, BID, Angiulli, Lavon Paganini, PA-C, 0.25 mg at 05/01/21 0658   [START ON 05/02/2021] dexamethasone (DECADRON) tablet 2 mg, 2 mg, Oral, Q12H, [name], MD   dexamethasone (DECADRON) tablet 8 mg, 8 mg, Oral, Q12H, [name], MD, 8 mg at 05/01/21 0853   docusate sodium (COLACE) capsule 100 mg, 100 mg, Oral, BID PRN, Cathlyn Parsons, PA-C, 100 mg at 04/27/21 1844   feeding supplement (ENSURE ENLIVE / ENSURE PLUS) liquid 237 mL, 237 mL, Oral, TID BM, [name], MD, 237 mL at 05/01/21 1114   irbesartan (AVAPRO) tablet 300 mg, 300 mg, Oral, Daily, 300 mg at 05/01/21 0263 **AND** [DISCONTINUED] hydrochlorothiazide (HYDRODIURIL) tablet 25 mg, 25 mg, Oral, Daily, Angiulli, Daniel J, PA-C, 25 mg at 04/27/21 0757   latanoprost (XALATAN) 0.005 % ophthalmic solution 1 drop, 1 drop, Both Eyes, QHS, Angiulli, Lavon Paganini, PA-C, 1 drop at 04/30/21 2109   LORazepam (ATIVAN) 2 MG/ML injection, , , ,    methylphenidate (RITALIN) tablet 15 mg, 15 mg, Oral, BID WC, [name], MD, 15 mg at 05/01/21 1255   multivitamin with minerals tablet 1 tablet, 1 tablet, Oral, Daily, [name], MD, 1 tablet at 05/01/21 0852   pantoprazole (PROTONIX) EC tablet 40 mg, 40 mg, Oral, Daily, Angiulli, Lavon Paganini, PA-C, 40 mg at 04/30/21 2106    polyethylene glycol (MIRALAX / GLYCOLAX) packet 17 g, 17 g, Oral, Daily PRN, Angiulli, Lavon Paganini, PA-C, 17 g at 04/27/21 1844   potassium chloride (KLOR-CON) packet 20 mEq, 20 mEq, Oral, BID, [name] T, MD, 20 mEq at 05/01/21 7858   sodium chloride 0.9 % IV infusion, , Intravenous, Continuous, [name], MD   sodium chloride tablet 2 g, 2 g, Oral, BID WC, Angiulli, Lavon Paganini, PA-C, 2 g at 05/01/21 8502  Exam: Current vital signs: BP 126/82 (BP Location: Left Arm)   Pulse 71   Temp (!) 97.5 F (36.4 C) (Oral)   Resp 18   Ht 5\' 8"  (1.727 m)   Wt 66.8 kg   SpO2 97%   BMI 22.39 kg/m  Vital signs in last 24 hours: Temp:  [96.7 F (35.9 C)-98.1 F (36.7 C)] 97.5 F (36.4 C) (07/02 1541) Pulse Rate:  [62-73] 71 (07/02 1541) Resp:  [  17-19] 18 (07/02 1541) BP: (101-176)/(71-95) 126/82 (07/02 1541) SpO2:  [94 %-99 %] 97 % (07/02 1541)  GENERAL: Lethargic, laying in bed, in no acute distress Head: Normocephalic with surgical incision site noted on superior scalp that was clean, dry, and intact EENT: Normal conjunctivae, no OP obstruction LUNGS: Normal respiratory effort. Non-labored breathing CV: Regular rate on telemetry, no pedal edema ABDOMEN - Soft, non-distended Ext: warm, well perfused, without obvious deformity  NEURO:  Mental Status: Drowsy, wakes to voice, oriented to self, age, and hospital. Does not correctly state the year or month. He states incorrectly that the month is June. Requires repeated stimulation for exam participation. Follows simple commands intermittently  Speech/Language: speech is intact with mild dysarthria. Slowed, hypophonic speech noted.   No aphasia or neglect is noted.  Cranial Nerves:  II: PERRL 3 mm/brisk. Visual fields full.  III, IV, VI: EOMI without ptosis V: Does not answer when questioned about facial sensation to light touch VII: Face is asymmetric with less elevation of right mouth with smiling VIII: Hearing is intact to  voice IX, X: Palate elevation is symmetric. Hypophonic XI: Right shoulder elevation > left shoulder XII: Tongue protrudes midline without fasciculations.   Motor: Patient with generalized weakness with bilateral asterixis appearing tremor with intention of bilateral upper extremities. Strength assessment fluctuates throughout time with patient Right upper extremity able to withstand antigravity movement without vertical drift consistently.  Left upper extremity 3/5 strength with some antigravity effort but unable to overcome gravity and drift to bed after initial assessment.  Left lower extremity 4/5 strength with vertical drift. Right lower extremity able to withstand antigravity movement without vertical drift.  Tone is normal. Bulk is decreased. Sensation: Sensation to light touch intact and symmetric in bilateral upper and lower extremities  Coordination: Unable to assess, patient does not attempt to participate in FNF or HKS Gait: Deferred  NIHSS: 1a Level of Conscious.: 1 1b LOC Questions: 1 1c LOC Commands: 0 2 Best Gaze: 0 3 Visual: 0 4 Facial Palsy: 1 5a Motor Arm - left: 2 5b Motor Arm - Right: 0 6a Motor Leg - Left: 1 6b Motor Leg - Right: 0 7 Limb Ataxia: 0 8 Sensory: 0 9 Best Language: 0 10 Dysarthria: 1 11 Extinct. and Inatten.: 0 TOTAL: 7  Labs I have reviewed labs in epic and the results pertinent to this consultation are: CBC    Component Value Date/Time   WBC 6.6 04/28/2021 0515   RBC 4.84 04/28/2021 0515   HGB 15.1 04/28/2021 0515   HCT 43.8 04/28/2021 0515   PLT 319 04/28/2021 0515   MCV 90.5 04/28/2021 0515   MCH 31.2 04/28/2021 0515   MCHC 34.5 04/28/2021 0515   RDW 12.7 04/28/2021 0515   LYMPHSABS 0.7 04/22/2021 0433   MONOABS 0.3 04/22/2021 0433   EOSABS 0.0 04/22/2021 0433   BASOSABS 0.0 04/22/2021 0433   CMP     Component Value Date/Time   NA 134 (L) 04/30/2021 0500   K 4.1 04/30/2021 0500   CL 100 04/30/2021 0500   CO2 25 04/30/2021  0500   GLUCOSE 151 (H) 04/30/2021 0500   BUN 27 (H) 04/30/2021 0500   CREATININE 0.59 (L) 04/30/2021 0500   CREATININE 0.82 09/10/2020 1045   CALCIUM 9.8 04/30/2021 0500   PROT 5.6 (L) 04/22/2021 0433   ALBUMIN 3.3 (L) 04/22/2021 0433   AST 18 04/22/2021 0433   ALT 33 04/22/2021 0433   ALKPHOS 66 04/22/2021 0433   BILITOT 1.1 04/22/2021  0433   GFRNONAA >60 04/30/2021 0500   GFRNONAA 87 09/10/2020 1045   GFRAA 101 09/10/2020 1045   Lipid Panel     Component Value Date/Time   CHOL 187 09/10/2020 1045   TRIG 64 09/10/2020 1045   HDL 55 09/10/2020 1045   CHOLHDL 3.4 09/10/2020 1045   VLDL 15.8 09/09/2019 1022   LDLCALC 116 (H) 09/10/2020 1045   LDLDIRECT 157.9 09/20/2011 0955   Lab Results  Component Value Date   HGBA1C 5.4 02/17/2021   Imaging I have reviewed the images obtained:  CT-scan of the brain CODE STROKE 05/01/2021: New 5 mm area of hyperdensity in the right temporal lobe. There is punctate corresponding enhancement on the prior MRI and this may reflect hemorrhage associated with a metastasis. Small volume residual hemorrhage within the occipital horns. Increased thin subdural hygroma along the right falx. Evolving postoperative changes and adjacent subacute infarction.  Assessment: 75 y.o. 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  - Examination reveals patient with fluctuating levels of alertness, fluctuating left hemiparesis, and right mouth droop.  - CTH revealed a 5 mm new area of hypodensity in the right temporal lobe concerning for hemorrhage associated with metastasis.  - Presentation likely related to metastasis with  hemorrhage versus seizure with post-ictal state. EEG ordered, 1 mg IV Ativan given, discontinued all blood thinners/antiplatelet medications. MRI brain with and without contrast pending, transfer from rehab to inpatient.  - Etiology of hemorrhage 2/2 known melanoma metastasis to the brain. Metastatic melanoma is known for hemorrhagic conversion.   Impression: Metastatic melanoma to the brain, right adrenal gland, and lung Concern for new hemorrhagic metastasis-right temporal lobe Concern for seizure 2/2 hemorrhagic brain metastasis Right frontal craniotomy for tumor resection 03/31/2021  Recommendations: - MRI brain with and without contrast - HOLD antiplatelet / blood thinning medications due to concern for hemorrhagic metastasis - Maintain systolic blood pressure < 160 - Routine EEG to evaluate for seizures - Inpatient seizure precautions - Continue Keppra 500 mg BID IV or PO  - 1 mg Ativan given with MD at bedside, ondansetron given for nausea x 1 - Cardiac telemetry - q2h neuro checks  - Ammonia and repeat UA pending  Pt seen by NP/Neuro and later by MD. Note/plan to be edited by MD as needed.  Anibal Henderson, AGAC-NP [facility] Pager: [phone]  Attending Neurohospitalist Addendum Patient seen and examined with APP/Resident. Agree with the history and physical as documented above. Agree with the plan as documented, which I helped formulate. I have independently reviewed the chart, obtained history, review of systems and examined the patient.I have personally reviewed pertinent head/neck/spine imaging (CT/MRI). Spoke with Dr. Tessa Lerner, admit to hospitalist Please feel free to call with any questions. --- Amie Portland, MD [facility] Pager: [phone]  If 7pm to 7am, please call on call as listed on AMION.

## 2021-05-01 NOTE — Progress Notes (Signed)
PROGRESS NOTE   Subjective/Complaints: Slept last night. No new issues reported. Ate fairly well yesterday  ROS: Limited due to cognitive/behavioral   Objective:   No results found. No results for input(s): WBC, HGB, HCT, PLT in the last 72 hours.   Recent Labs    04/29/21 0455 04/30/21 0500  NA 132* 134*  K 4.3 4.1  CL 98 100  CO2 26 25  GLUCOSE 121* 151*  BUN 28* 27*  CREATININE 0.74 0.59*  CALCIUM 9.7 9.8    Intake/Output Summary (Last 24 hours) at 05/01/2021 5170 Last data filed at 05/01/2021 0500 Gross per 24 hour  Intake 480 ml  Output 550 ml  Net -70 ml        Physical Exam: Vital Signs Blood pressure 101/76, pulse 73, temperature 97.7 F (36.5 C), temperature source Oral, resp. rate 19, height 5\' 8"  (1.727 m), weight 66.8 kg, SpO2 95 %. Constitutional: No distress . Vital signs reviewed. HEENT: EOMI, oral membranes moist Neck: supple Cardiovascular: RRR without murmur. No JVD    Respiratory/Chest: CTA Bilaterally without wheezes or rales. Normal effort    GI/Abdomen: BS +, non-tender, non-distended Ext: no clubbing, cyanosis, or edema Psych: flat but attempts to cooeprate  Skin: scattered bruising, NSL Neurological:    Comments:   more alert but still very slow to process. Oriented to self only.  Moves all 4's. 4- to 4/5 strength throughout. Speech clearer when he phonates.   MSK: no pain today in trunk/limbs   Assessment/Plan: 1. Functional deficits which require 3+ hours per day of interdisciplinary therapy in a comprehensive inpatient rehab setting. Physiatrist is providing close team supervision and 24 hour management of active medical problems listed below. Physiatrist and rehab team continue to assess barriers to discharge/monitor patient progress toward functional and medical goals  Care Tool:  Bathing    Body parts bathed by patient: Right arm, Left arm, Chest, Abdomen, Right upper  leg, Left upper leg, Face   Body parts bathed by helper: Right lower leg, Left lower leg Body parts n/a: Buttocks, Front perineal area   Bathing assist Assist Level: Maximal Assistance - Patient 24 - 49% (for sitting balance seated at sink)     Upper Body Dressing/Undressing Upper body dressing   What is the patient wearing?: Pull over shirt    Upper body assist Assist Level: Minimal Assistance - Patient > 75%    Lower Body Dressing/Undressing Lower body dressing      What is the patient wearing?: Incontinence brief     Lower body assist Assist for lower body dressing: Maximal Assistance - Patient 25 - 49%     Toileting Toileting    Toileting assist Assist for toileting: Maximal Assistance - Patient 25 - 49% Assistive Device Comment: urinal   Transfers Chair/bed transfer  Transfers assist     Chair/bed transfer assist level: Moderate Assistance - Patient 50 - 74% Chair/bed transfer assistive device: Armrests   Locomotion Ambulation   Ambulation assist   Ambulation activity did not occur: Safety/medical concerns (patient unable to stand >5 sec with max A before LOB and total A to sit)  Assist level: Moderate Assistance - Patient 50 - 74%  Assistive device: Ethelene Hal Max distance: 25 ft   Walk 10 feet activity   Assist  Walk 10 feet activity did not occur: Safety/medical concerns  Assist level: Moderate Assistance - Patient - 50 - 74% Assistive device: Walker-Eva   Walk 50 feet activity   Assist Walk 50 feet with 2 turns activity did not occur: Safety/medical concerns         Walk 150 feet activity   Assist Walk 150 feet activity did not occur: Safety/medical concerns         Walk 10 feet on uneven surface  activity   Assist Walk 10 feet on uneven surfaces activity did not occur: Safety/medical concerns         Wheelchair     Assist Will patient use wheelchair at discharge?: Yes (Per PT long term goals) Type of Wheelchair:  Manual Wheelchair activity did not occur: Safety/medical concerns (patient with significant forward lean and needed PT to block him in sitting for safety, used TIS w/c)         Wheelchair 50 feet with 2 turns activity    Assist    Wheelchair 50 feet with 2 turns activity did not occur: Safety/medical concerns       Wheelchair 150 feet activity     Assist  Wheelchair 150 feet activity did not occur: Safety/medical concerns       Blood pressure 101/76, pulse 73, temperature 97.7 F (36.5 C), temperature source Oral, resp. rate 19, height 5\' 8"  (1.727 m), weight 66.8 kg, SpO2 95 %.    Medical Problem List and Plan: 1.  Left hemiparesis with visual deficits secondary to metastatic melanoma status postcraniotomy for resection of right frontal tumor 03/31/2021.  Pathology positive for malignant melanoma             -patient may shower             -ELOS/Goals: MinA 10-14 days  -appreciate Dr. Renda Rolls consultation and recs   - some improvement in arousal with ritalin and steroids  -will likely need placement  -appreciate palliative care assistance.  2.  Impaired mobility/s/p craniotomy: -DVT/anticoagulation: Continue Subcutaneous heparin 5000 units every 12 hours until cleared by NSGY.               -antiplatelet therapy: N/A 3. Pain Management: Tylenol as needed 4. Impaired attention/initiation:   continue ritalin 15mg  bid              -antipsychotic agents: N/A 5. Neuropsych: This patient is not capable of making decisions on his own behalf. 6. Skin/Wound Care: Routine skin checks 7. Fluids/Electrolytes/Nutrition: pt eating better 8.  Seizure prophylaxis.  keppra completed. 9.  Hypertension.  Norvasc 5 mg daily, Avapro 300 mg daily, HCTZ 25 mg daily.     -bp well controlled 10.  Hyponatremia.  7/1 sodium up to 134 -continue Sodium chloride tablets 2 g twice daily.   -1500 cc FR -continue to hold HCTZ   -Still I/O negative, will start low rate [facility] at [facility] -check labs  tomorrow 11.  History of glaucoma.  Followed by Dr. Satira Sark.  Continue eyedrops as directed. 12.  History of asthma.  Continue inhalers as directed 13. Bradycardia: continue to monitor TID, normalized. 60's 14. Hypomagnesemia: magnesium normalized to 2 most recently.  15. Hypokalemia: K+ 4.1.  Continue supplementation  LOS: 10 days A FACE TO FACE EVALUATION WAS PERFORMED  [name] 05/01/2021, 9:22 AM

## 2021-05-01 NOTE — Progress Notes (Signed)
This nurse notified of patient issue by Nurse tech patient went unresponsive on side of bed with OT. Charge nurse notified and rapid response called. Patient transported to CT NIHSS score 2 per rapid response. Iv placed in left wrist with 1mg  ativan and 4 mg Zofran given for nausea and vomiting on return trip from [place] notified of plan from Dr. Rory Percy [facility] to transfer patient to acute.

## 2021-05-01 NOTE — Progress Notes (Signed)
EEG completed, results pending. 

## 2021-05-01 NOTE — Procedures (Signed)
Patient Name: Shannon Horton.  MRN: [id number]  Epilepsy Attending: Lora Havens  Referring Physician/Provider: Anibal Henderson, NP Date: 05/01/2021 Duration: 22.25 mins  Patient history: 75yo M with acute unresponsiveness, right facial weakness, left hemiparesis. EEG to evaluate for seizure  Level of alertness:  lethargic  AEDs during EEG study: Ativan, LEV  Technical aspects: This EEG study was done with scalp electrodes positioned according to the 10-20 International system of electrode placement. Electrical activity was acquired at a sampling rate of 500Hz  and reviewed with a high frequency filter of 70Hz  and a low frequency filter of 1Hz . EEG data were recorded continuously and digitally stored.   Description: EEG showed continuous generalized and maximal right frontal polymorphic mixed frequencies with predominantly 5 to 6 Hz theta as well as intermittent 2-3Hz  delta slowing admixed with 15-18Hz  generalized beta activity.  Hyperventilation and photic stimulation were not performed.     ABNORMALITY - Continuous slow, generalized and maximal right frontal   IMPRESSION: This study is suggestive of cortical dysfunction arising from left frontal region, likely secondary to underlying structural abnormality. Additionally there is moderate to severe diffuse encephalopathy, non specific etiology. No seizures or definite epileptiform discharges were seen throughout the recording.   Leslee Suire [name]

## 2021-05-01 NOTE — Progress Notes (Signed)
Code stroke called in therapy after patient became acutely unresponsive. CT of head demonstrates new 66mm hemmorhage in the right temporal lobe which is likely d/t metastatic lesion located there. I spoke with Dr. Malen Gauze who feels that this patient needs to be transferred back to the acute side of hospital for closer monitoring, ?seizure work up, etc. He asked that the [facility] admit the patient, and neurology will follow along. Family is aware of today's events.   [name], MD, [facility] 05/01/2021

## 2021-05-01 NOTE — Progress Notes (Signed)
Occupational Therapy Session Note  Patient Details  Name: Shannon Horton. MRN: [id number] Date of Birth: 1945/11/12  Today's Date: 05/01/2021 OT Individual Time: 3151-7616 OT Individual Time Calculation (min): 55 min   Session 2: OT Individual Time: 1430-1450 OT Individual Time Calculation (min): 20 min    Short Term Goals: Week 2:  OT Short Term Goal 1 (Week 2): Pt will don shirt in supported sitting with MIN A OT Short Term Goal 2 (Week 2): Pt will consistently transfer with MIN A and LRAD OT Short Term Goal 3 (Week 2): pt will roll in bed with S to decrease BOC with LB dressing/incontinent episodes OT Short Term Goal 4 (Week 2): Pt will sit EOB with MOD A dynamically  Skilled Therapeutic Interventions/Progress Updates:    Pt received sitting up in bed, breakfast in front of him and seemingly untouched. Pt requested to call his wife and was unable to remember any part of her phone number. Accessed this from the chart and assisted pt in dialing number. He was incredibly confused and cofabulating on phone with his family. Turned off TV and discussed with pt's son Shannon Horton about keeping TV off to reduce confusion at this time. Pt required mod cueing to initiate EOB mobility. He required mod A for supine to sitting EOB with max instructional/sequencing cues. He had a R lean today  with full R LOB's without mod-max A from OT. He had severe forward trunk flexion while sitting and posterior pelvic tilt. He required total A to don pants, requiring heavy max A to stand from elevated EOB. He returned to supine and reported need to urinate urgently. Urinal placed and pt voided. Pt required max cueing for sequencing bed mobility to scoot up. Pt was left sitting up with all needs met, bed alarm set. His son Shannon Horton now present.   Session 2:  Pt received supine with his son Shannon Horton present. Pt requesting to use the bathroom. He required mod cueing for sequencing transfer to EOB. Max A overall to come  EOB. Pt required heavy max A to maintain EOB sitting balance. Attempted sit > stand x3 with no success d/t B knee flexion and posterior bias. Attempted squat pivot with total A but pt was pushing away from R side and transfer was terminated. Pt was resting back when he became unresponsive with his head and gaze turned R. Immediately called RN to activated rapid response and code stroke. Provided info re current baseline and then nursing staff took over care at this point.   Therapy Documentation Precautions:  Precautions Precautions: Fall Precaution Comments: R crani Restrictions Weight Bearing Restrictions: No  Therapy/Group: Individual Therapy  Curtis Sites 05/01/2021, 7:25 AM

## 2021-05-01 NOTE — Progress Notes (Signed)
Physical Therapy Session Note  Patient Details  Name: Shannon Horton. MRN: [id number] Date of Birth: January 26, 1946  Today's Date: 05/01/2021 PT Individual Time: 0804-0901 PT Individual Time Calculation (min): 57 min   Short Term Goals: Week 2:  PT Short Term Goal 1 (Week 2): Patient will perform basic transfers using LRAD with min A >75% of the time. PT Short Term Goal 2 (Week 2): Patient will perform sitting balance with mod A consistently and min A >50% of the time. PT Short Term Goal 3 (Week 2): Patient will perform functional standing with min A >75% of the time.  Skilled Therapeutic Interventions/Progress Updates:  Patient supine in bed on entrance to room. Patient initially asleep and requires time and effort to fully wake. Pt then agreeable to PT session. Patient denied pain during session.  Therapeutic Activity: Bed Mobility: Patient performed supine --> sit with good initiation but requires assist with motor planning and execution to bring BLE to EOB. Overall Max A to reach seated position on EOB. VC/ tc throughout for technique. Sitting balance poor with LOB mainly to R and intermittently posteriorly. Pt relates desire to toilet. At end of session, sit--> supine performed with max A. Positioning toward [facility] Max A +2.  Transfers: Patient performed lateral scoot transfer to R into standard w/c at bedside. Pt requires Max/total A to initiate but with understanding of motor plan, pt able to engage more and participate with improved movement to complete into chair with Mod A. Provided vc/ tc throughout for technique.   Several attempts for STS at toilet to [place] with Max A and pt unable to bring UB to upright stance despite cues to push with BUE on RW and bring head/ shoulders upright. Related to pt that he may need to use urinal at bedside. Pt demos understanding. Return to bedside and lateral scoot transfer performed with Max A.   Wheelchair Mobility:  Patient guided in use of BLE to  propel standard wheelchair over 25 feet with Min A. Mod A provided to correct R sided lean. Provided vc for maintaining reciprocal foot motion to move w/c forward. Max A for turns.   Neuromuscular Re-ed: NMR facilitated during session with focus on sitting balance and midline orientation. Pt guided in seated task at EOB with aligning self with therapist body alignment. When questioned, pt is able to relate that he is leaning to R, but initially is unable to demo signs of correcting requiring Max to total A to maintain upright balance. Improves to Mod A with intermittent bouts of holding midline for brief periods. Any reaching task anteriorly results in R sided lean. NMR performed for improvements in motor control and coordination, balance, sequencing, judgement, and self confidence/ efficacy in performing all aspects of mobility at highest level of independence.   Patient supine in bed at end of session with brakes locked, bed alarm set, and all needs within reach. RN in room and providing AM medications at end of session.      Therapy Documentation Precautions:  Precautions Precautions: Fall Precaution Comments: R crani Restrictions Weight Bearing Restrictions: No  Therapy/Group: Individual Therapy  [name] PT, DPT 05/01/2021, 11:11 AM

## 2021-05-02 DIAGNOSIS — I10 Essential (primary) hypertension: Secondary | ICD-10-CM

## 2021-05-02 DIAGNOSIS — R569 Unspecified convulsions: Secondary | ICD-10-CM

## 2021-05-02 DIAGNOSIS — C7931 Secondary malignant neoplasm of brain: Principal | ICD-10-CM

## 2021-05-02 LAB — CBC WITH DIFFERENTIAL/PLATELET
Abs Immature Granulocytes: 0.07 10*3/uL (ref 0.00–0.07)
Basophils Absolute: 0.1 10*3/uL (ref 0.0–0.1)
Basophils Relative: 1 %
Eosinophils Absolute: 0 10*3/uL (ref 0.0–0.5)
Eosinophils Relative: 0 %
HCT: 50.1 % (ref 39.0–52.0)
Hemoglobin: 16.4 g/dL (ref 13.0–17.0)
Immature Granulocytes: 1 %
Lymphocytes Relative: 17 %
Lymphs Abs: 1.3 10*3/uL (ref 0.7–4.0)
MCH: 31.9 pg (ref 26.0–34.0)
MCHC: 32.7 g/dL (ref 30.0–36.0)
MCV: 97.5 fL (ref 80.0–100.0)
Monocytes Absolute: 0.6 10*3/uL (ref 0.1–1.0)
Monocytes Relative: 8 %
Neutro Abs: 5.3 10*3/uL (ref 1.7–7.7)
Neutrophils Relative %: 73 %
Platelets: 186 10*3/uL (ref 150–400)
RBC: 5.14 MIL/uL (ref 4.22–5.81)
RDW: 12.8 % (ref 11.5–15.5)
WBC: 7.3 10*3/uL (ref 4.0–10.5)
nRBC: 0 % (ref 0.0–0.2)

## 2021-05-02 LAB — BASIC METABOLIC PANEL
Anion gap: 11 (ref 5–15)
BUN: 21 mg/dL (ref 8–23)
CO2: 21 mmol/L — ABNORMAL LOW (ref 22–32)
Calcium: 9.6 mg/dL (ref 8.9–10.3)
Chloride: 100 mmol/L (ref 98–111)
Creatinine, Ser: 0.55 mg/dL — ABNORMAL LOW (ref 0.61–1.24)
GFR, Estimated: >60 mL/min (ref >60)
Glucose, Bld: 124 mg/dL — ABNORMAL HIGH (ref 70–99)
Potassium: 5.3 mmol/L — ABNORMAL HIGH (ref 3.5–5.1)
Sodium: 132 mmol/L — ABNORMAL LOW (ref 135–145)

## 2021-05-02 MED ORDER — DEXAMETHASONE SODIUM PHOSPHATE 4 MG/ML IJ SOLN
4.0000 mg | Freq: Four times a day (QID) | INTRAMUSCULAR | Status: DC
  Administered 2021-05-02 – 2021-05-09 (×28): 4 mg via INTRAVENOUS
  Filled 2021-05-02 (×27): qty 1

## 2021-05-02 MED ORDER — DEXAMETHASONE SODIUM PHOSPHATE 4 MG/ML IJ SOLN
4.0000 mg | Freq: Two times a day (BID) | INTRAMUSCULAR | Status: DC
  Administered 2021-05-02: 4 mg via INTRAVENOUS
  Filled 2021-05-02: qty 1

## 2021-05-02 MED ORDER — LEVETIRACETAM 500 MG PO TABS
500.0000 mg | ORAL_TABLET | Freq: Two times a day (BID) | ORAL | Status: DC
  Administered 2021-05-02 – 2021-05-05 (×6): 500 mg via ORAL
  Filled 2021-05-02 (×6): qty 1

## 2021-05-02 NOTE — Progress Notes (Signed)
Patient did not do well eating tonight.  He cheeked most of his food, and the tech had to remove it from his mouth.  He was not following verbal cues.

## 2021-05-02 NOTE — Plan of Care (Signed)
Pt was lethargic and only alert to person and place throughout shift complicating education and teach-back.   Problem: Intracerebral Hemorrhage Tissue Perfusion: Goal: Complications of Intracerebral Hemorrhage will be minimized Outcome: Progressing   Problem: Clinical Measurements: Goal: Ability to maintain clinical measurements within normal limits will improve Outcome: Progressing Goal: Will remain free from infection Outcome: Progressing Goal: Respiratory complications will improve Outcome: Progressing Goal: Cardiovascular complication will be avoided Outcome: Progressing   Problem: Elimination: Goal: Will not experience complications related to bowel motility Outcome: Progressing Goal: Will not experience complications related to urinary retention Outcome: Progressing

## 2021-05-02 NOTE — Discharge Summary (Signed)
Physician Discharge Summary  Patient ID: Shannon Horton. MRN: [id number] DOB/AGE: 1946-08-05 75 y.o.  Admit date: 05/01/2021 Discharge date: 05/01/2021  Discharge Diagnoses:  Active Problems:   IVH (intraventricular hemorrhage) (HCC)   Intracranial hemorrhage (HCC) Seizure prophylaxis Hypertension Hyponatremia Metastatic melanoma History of glaucoma Asthma Bradycardia Hypomagnesemia History of tobacco use  Discharged Condition: Guarded  Significant Diagnostic Studies: DG Chest 2 View  Result Date: 04/17/2021 CLINICAL DATA:  Cough EXAM: CHEST - 2 VIEW COMPARISON:  04/10/2021, CT from 03/25/2021 FINDINGS: Cardiac shadow is stable. Mild tortuous thoracic aorta is again seen. Bibasilar airspace opacity is noted slightly improved when compared with the prior exam. Fullness of the right hilum is noted related to vessel on end. Stable right upper lobe nodule is noted seen on previous CT. The known left upper lobe nodule is not as well appreciated on this exam as on prior CT. No sizable effusion is seen. No acute bony abnormality is noted. IMPRESSION: Improving bibasilar airspace opacity. Stable right upper lobe nodule unchanged from prior CT. The known left upper lobe sub solid nodule is not as well appreciated on this exam. Electronically Signed   By: Inez Catalina M.D.   On: 04/17/2021 15:33   DG Chest 2 View  Result Date: 04/10/2021 CLINICAL DATA:  Follow-up of pneumonia EXAM: CHEST - 2 VIEW COMPARISON:  1 day prior FINDINGS: Osteopenia. Hyperinflation. The Chin overlies the apices. Midline trachea. Normal heart size. Atherosclerosis in the transverse aorta. No pleural effusion or pneumothorax. Left greater than right base airspace disease, slightly increased. 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 IMPRESSION: Slightly increased left greater than right base airspace disease, suspicious for infection or aspiration. Underlying hyperinflation, suggesting COPD. Aortic  Atherosclerosis (ICD10-I70.0). Electronically Signed   By: Abigail Miyamoto M.D.   On: 04/10/2021 08:34   DG Chest 2 View  Result Date: 04/09/2021 CLINICAL DATA:  Shortness of breath EXAM: CHEST - 2 VIEW COMPARISON:  Mar 25, 2021 chest radiograph and chest CT FINDINGS: 1 cm nodular opacity in the left upper lobe is again noted and unchanged. Airspace opacity consistent with pneumonia in the lung bases is present. The heart size and pulmonary vascularity are normal. No adenopathy appreciable. There is aortic atherosclerosis. Sclerotic focus noted in the left proximal humerus, stable. IMPRESSION: 1.  Bibasilar airspace opacity consistent with bibasilar pneumonia. 2. 1 cm nodular opacity right upper lobe, unchanged. Neoplastic focus of concern. 3.  Heart size normal. 4.  Aortic Atherosclerosis (ICD10-I70.0). 5.  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 Electronically Signed   By: Lowella Grip III M.D.   On: 04/09/2021 12:59   CT HEAD WO CONTRAST  Addendum Date: 04/18/2021   ADDENDUM REPORT: 04/18/2021 11:50 ADDENDUM: Study discussed by telephone with Dr. Emelda Brothers on 04/18/2021 at 1139 hours. Electronically Signed   By: Genevie Ann M.D.   On: 04/18/2021 11:50   Result Date: 04/18/2021 CLINICAL DATA:  75 year old male with unexplained altered mental status. Right basal ganglia tumor resection earlier this month. Pathology revealing metastatic melanoma. EXAM: CT HEAD WITHOUT CONTRAST TECHNIQUE: Contiguous axial images were obtained from the base of the skull through the vertex without intravenous contrast. COMPARISON:  Postoperative head CT 04/09/2021 and earlier. FINDINGS: Brain: There is a small new volume of intraventricular hemorrhage layering within the left occipital horn on series 3, image 18. Right ventricle size appears slightly larger than before, but this might be related to decreased right hemisphere edema. No definite ventriculomegaly or transependymal edema. But no other acute  intracranial hemorrhage is identified. Furthermore, MRI appearance on 04/01/2021 was suspicious for a small left choroid a plexus metastasis (same image as above). Stable CT appearance of postoperative changes in the right hemisphere tracking to the right lentiform. Regressed edema since 04/01/2021. No new intracranial mass or mass effect is evident. Normal basilar cisterns. No cortically based acute infarct identified. Vascular: Mild Calcified atherosclerosis at the skull base. No suspicious intracranial vascular hyperdensity. Skull: Stable right frontotemporal craniotomy. No acute osseous abnormality identified. Sinuses/Orbits: Visualized paranasal sinuses and mastoids are clear. Other: Regressed scalp postoperative changes. Orbits soft tissues remain negative. A small volume of right retro maxillary soft tissue gas has decreased and may be postoperative. IMPRESSION: 1. Positive for a small volume of acute left occipital horn IVH likely secondary to the small left choroid plexus metastasis suspected by MRI. No definite acute ventriculomegaly or transependymal edema. 2. Satisfactory noncontrast CT postoperative appearance of the right lentiform resection cavity. Electronically Signed: By: Genevie Ann M.D. On: 04/18/2021 11:28   CT HEAD WO CONTRAST  Result Date: 04/09/2021 CLINICAL DATA:  Altered mental status. EXAM: CT HEAD WITHOUT CONTRAST TECHNIQUE: Contiguous axial images were obtained from the base of the skull through the vertex without intravenous contrast. COMPARISON:  Head CT scan 03/25/2021.  Brain MRI 03/26/2021. FINDINGS: Brain: The patient has undergone right frontal craniotomy for resection of a right frontal lobe mass since the prior exams. There is a small amount of pneumocephalus and a tiny amount of hemorrhage at the surgical site. No hydrocephalus, midline shift or evidence of acute infarct. Vascular: No hyperdense vessel or unexpected calcification. Skull: No acute abnormality.  Right craniotomy  defect noted. Sinuses/Orbits: Negative. Other: None. IMPRESSION: Status post resection of a right frontal lobe mass. Small amount of pneumocephalus and a tiny amount of hemorrhage at the surgical site are consistent with postoperative change. No acute abnormality is identified. Electronically Signed   By: Inge Rise M.D.   On: 04/09/2021 12:42   MR BRAIN W WO CONTRAST  Result Date: 05/01/2021 CLINICAL DATA:  Stroke follow-up EXAM: MRI HEAD WITHOUT AND WITH CONTRAST TECHNIQUE: Multiplanar, multiecho pulse sequences of the brain and surrounding structures were obtained without and with intravenous contrast. CONTRAST:  6.29mL GADAVIST GADOBUTROL 1 MMOL/ML IV SOLN COMPARISON:  04/19/2021 FINDINGS: Brain: Unchanged area of diffusion abnormality the right frontal lobe at the resection site. There is decreased contrast enhancement in this region since the prior study. Enhancing focus of the right caudate head is decreased in size, now measuring 9 x 3 mm, previously 15 x 7 mm. There are multiple chronic microhemorrhages in a predominantly peripheral distribution. There is also hemosiderin deposition at the right basal ganglia. These findings unchanged. Hyperintense T2-weighted signal is widespread throughout the white matter. Diffuse, severe atrophy. The midline structures are normal. 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 There is a 4 mm contrast enhancing lesion of the anterior left insula (21:28), slightly larger than on the previous study. Vascular: Major flow voids are preserved. Skull and upper cervical spine: Remote right frontal craniotomy. Sinuses/Orbits:No paranasal sinus fluid levels or advanced mucosal thickening. No mastoid or middle ear effusion. Normal orbits. IMPRESSION: 1. Slightly larger 4 mm metastatic lesion of the anterior left insula. 2. Decreased size of enhancing focus at the right caudate head. 3. Unchanged appearance of left choroid plexus mass  measuring 7 mm in diameter. 4. Decreased contrast enhancement at the right frontal resection site, likely resolving postoperative change. Electronically Signed   By:  Ulyses Jarred M.D.   On: 05/01/2021 20:53   MR BRAIN W WO CONTRAST  Result Date: 04/19/2021 CLINICAL DATA:  Mental status change. EXAM: MRI HEAD WITHOUT AND WITH CONTRAST TECHNIQUE: Multiplanar, multiecho pulse sequences of the brain and surrounding structures were obtained without and with intravenous contrast. CONTRAST:  12mL GADAVIST GADOBUTROL 1 MMOL/ML IV SOLN COMPARISON:  CT head April 18, 2021.  MRI head April 01, 2021. FINDINGS: Brain: Postoperative changes of right frontal craniotomy and mass resection. Peripheral enhancement and restricted diffusion along the resection cavity likely represents devitalized tissue. Decreased surrounding edema and regional mass effect with decreased conspicuity of blood products and developing encephalomalacia. Resolution of previously seen midline shift. Mild increase in ventricular size relative to prior MRI. As seen on recent CT head, acute left occipital horn intraventricular hemorrhage. Enhancing left choroid plexus lesion, which appears slightly increased in size (approximately 8 mm on series 20, image 17, previously 6 mm when remeasured) with more homogeneous enhancement. Additional more focal area of enhancement and restricted diffusion in the more superomedial corona radiata/caudate correlates with previously seen acute infarct in is compatible with evolving/subacute infarct. This region of enhancement partially obscures the previously seen right caudate enhancing lesion. Additional mild to moderate scattered T2/FLAIR hyperintensities within the white matter, likely related to chronic microvascular ischemic disease. Vascular: Major arterial flow voids are maintained at the skull base. Skull and upper cervical spine: Normal marrow signal. Sinuses/Orbits: Largely clear sinuses.  Unremarkable orbits. Other:  No mastoid effusions. IMPRESSION: 1. Acute left occipital horn intraventricular hemorrhage, similar to recent CT head and increased from prior MRI. 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 2. Mild increase in ventricular size relative to prior MRI, which is likely in part related to atrophy and decreasing mass effect from the resected right frontal lobe mass. In the setting of intraventricular hemorrhage, recommend follow-up CT to ensure stability and exclude developing hydrocephalus. 3. Evolving changes of right basal ganglia tumor resection with decreased surrounding edema and mass effect. Resolution of midline shift. Irregular enhancement along the periphery of the resection cavity may represent evolving postoperative changes but warrants attention on follow-up. 4. Increased enhancement in the region of recently seen infarct superomedial to the resection cavity, most likely enhancing subacute infarct given findings on the prior MRI. This limits assessment for progressive tumor in this region and partially obscures the right caudate lesion. Recommend attention on short interval follow-up to ensure resolution and to better assess. Electronically Signed   By: Margaretha Sheffield MD   On: 04/19/2021 08:09   EEG adult  Result Date: 05/01/2021 Lora Havens, MD     05/01/2021  5:16 PM Patient Name: Shannon Horton. MRN: [id number] Epilepsy Attending: Lora Havens Referring Physician/Provider: Anibal Henderson, NP Date: 05/01/2021 Duration: 22.25 mins Patient history: 75yo M with acute unresponsiveness, right facial weakness, left hemiparesis. EEG to evaluate for seizure Level of alertness:  lethargic AEDs during EEG study: Ativan, LEV Technical aspects: This EEG study was done with scalp electrodes positioned  according to the 10-20 International system of electrode placement. Electrical activity was acquired at a sampling rate of 500Hz  and reviewed with a high frequency filter of 70Hz  and a low frequency filter of 1Hz . EEG data were recorded continuously and digitally stored. Description: EEG showed continuous generalized and maximal right frontal polymorphic  mixed frequencies with predominantly 5 to 6 Hz theta as well as intermittent 2-3Hz  delta slowing admixed with 15-18Hz  generalized beta activity. Hyperventilation and photic stimulation were not performed.   ABNORMALITY - Continuous slow, generalized and maximal right frontal IMPRESSION: This study is suggestive of cortical dysfunction arising from left frontal region, likely secondary to underlying structural abnormality. Additionally there is moderate to severe diffuse encephalopathy, non specific etiology. No seizures or definite epileptiform discharges were seen throughout the recording. Lora Havens   CT HEAD CODE STROKE WO CONTRAST  Result Date: 05/01/2021 CLINICAL DATA:  Code stroke. EXAM: CT HEAD WITHOUT CONTRAST TECHNIQUE: Contiguous axial images were obtained from the base of the skull through the vertex without intravenous contrast. COMPARISON:  CT 04/18/2021, MRI 04/19/2021 FINDINGS: Brain: There is a new 5 mm area of hyperdensity of the right temporal lobe (series 3, image 16). Punctate area of corresponding enhancement is noted on the prior MRI. Evolving postoperative changes in the right lentiform nucleus region. Adjacent low-attenuation extending to the right frontal horn likely corresponds to area of subacute infarction on MRI. Ill-defined hyperdensity could reflect petechial hemorrhage. Stable left choroid plexus lesion. Small volume residual hemorrhage within the occipital horns. Thin subdural hygroma along the right falx has increased since the MRI. No new loss of gray-white differentiation. Prominence of the ventricles and sulci reflects  generalized cerebral and cerebellar parenchymal volume loss. Superimposed communicating hydrocephalus is not excluded. Vascular: No hyperdense vessel. Skull: Right frontal craniotomy. Sinuses/Orbits: No acute abnormality. Other: Mastoid air cells are clear. IMPRESSION: New 5 mm area of hyperdensity in the right temporal lobe. There is punctate corresponding enhancement on the prior MRI and this may reflect hemorrhage associated with a metastasis. Small volume residual hemorrhage within the occipital horns. Increased thin subdural hygroma along the right falx. Evolving postoperative changes and adjacent subacute infarction. Initial results were communicated to Dr. Rory Percy at 3:21 pm on 05/01/2021 by text page via the [facility] messaging system. Electronically Signed   By: Macy Mis M.D.   On: 05/01/2021 15:29    Labs:  Basic Metabolic Panel: Recent Labs  Lab 04/28/21 0515 04/29/21 0455 04/30/21 0500 05/02/21 0104  NA 130* 132* 134* 132*  K 4.2 4.3 4.1 5.3*  CL 95* 98 100 100  CO2 25 26 25  21*  GLUCOSE 136* 121* 151* 124*  BUN 20 28* 27* 21  CREATININE 0.61 0.74 0.59* 0.55*  CALCIUM 9.9 9.7 9.8 9.6    CBC: Recent Labs  Lab 04/28/21 0515 05/02/21 0104  WBC 6.6 7.3  NEUTROABS  --  5.3  HGB 15.1 16.4  HCT 43.8 50.1  MCV 90.5 97.5  PLT 319 186    CBG: Recent Labs  Lab 05/01/21 1451  GLUCAP 183*   Family history.  Mother with hypertension colon polyps.  Father with diabetes.  Brother with throat lung cancer.  Negative for colon cancer or stomach cancer or esophageal cancer or pancreatic cancer  Brief HPI:   Raven Harmes. is a 75 y.o. right-handed male with history of glaucoma followed by Dr. Satira Sark hypertension tobacco use as well as remote melanoma x2 removed from his right calf and left flank 2011 with negative nodal biopsy.  Per chart review lives with spouse two-level home.  Independent with assistive device with reported multiple falls over the past 6 weeks.  Presented  03/25/2021 with persistent headache and balance deficits x6 weeks left hemiparesis as well as shuffling gait.  MRI of the brain showed a 3.8 cm enhancing  and hemorrhagic mass in the right basal ganglia frontal lobe region with a 5 mm left midline shift.  CT of the chest abdomen was notable for left upper extremity pulmonary nodule left hilar adenopathy right upper lobe pulmonary nodule left lower quadrant mass arising from the right adrenal gland, hepatic lesions.  CT-guided biopsy of the adrenal mass 03/30/2021 positive for malignancy.  Patient underwent right craniotomy resection of tumor 03/31/2021 per Dr. Reatha Armour.  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  Brain surgical tissue pathology remain pending follow-up per oncology services.  Decadron protocol as indicated.  Maintained on Keppra for seizure prophylaxis.  Patient was cleared to begin subcutaneous heparin for DVT prophylaxis 04/02/2021.  Tolerating a regular consistency diet.  Therapy evaluations completed due to patient decreased functional ability left side weakness was admitted to inpatient rehab services 04/06/2021.  Patient with slow progressive gains while at rehab services.  He did receive follow-up per neuropsychology for hospital course depression emotional support provided.  Wife and family did note some altered mental status changes 04/17/2021 urinalysis completed negative nitrite, CBC unremarkable latest chemistry sodium 130.  Cranial CT scan completed 04/18/2021 positive for small volume of acute left occipital horn IVH likely secondary to left coracoid plexus metastasis.  No definite acute ventriculomegaly or transependymal edema.  Contact made to neurosurgery due to these acute findings patient was discharged to acute care services for ongoing monitoring.  MRI of the brain completed 04/19/2021 showing acute left occipital horn intraventricular hemorrhage similar to recent CT of the head.  Mild increase in ventricular size  relative to prior MRI.  Evolving changes of right basal ganglia tumor resection with decreased surrounding edema and mass-effect.  Resolution of midline shift.  Patient with ongoing conservative care maintained on low-dose Decadron therapy.  He was cleared to continue with subcutaneous heparin as directed.  Placed on sodium chloride tablets for hyponatremia with latest sodium level 132.  Mental status did show some improvement.  Patient's therapy resume readmitted back to inpatient rehab services for comprehensive therapies.   Hospital Course: Altin Sease. was admitted to rehab 05/01/2021 for inpatient therapies to consist of PT, ST and OT at least three hours five days a week. Past admission physiatrist, therapy team and rehab RN have worked together to provide customized collaborative inpatient rehab.  Pertaining to patient's left hemiparesis visual deficits secondary to metastatic melanoma status post craniotomy resection of right frontal tumor 03/31/2021.  Pathology positive for malignant melanoma.  Followed by neurosurgery as well as oncology/medical oncology Dr. Mickeal Skinner.  Placed on Ritalin to help patient sustained better attention to task and focus.  Decadron therapy as directed.  He had been cleared to continue subcutaneous heparin for DVT prophylaxis.  Blood pressure soft monitored on Norvasc Avapro as well as HCTZ.  Keppra completed for seizure prophylaxis.  Bouts of hyponatremia sodium tablets as advised placed on a fluid restriction HCTZ held started on IV fluids at bedtime.  He did have a history of glaucoma followed by Dr. Satira Sark.  Patient with history of asthma on monitoring of oxygen saturations.  On the morning of 05/01/2021 patient became was reportedly unresponsive acutely while with therapy CT of the head demonstrated a new 5 mm hemorrhage in the right frontal lobe likely D/T metastatic lesion located there.  Discussed with neurology services patient was discharged back to acute care services  for medical management   Blood pressures were monitored on TID basis and soft and monitored     Rehab course:  During patient's stay in rehab weekly team conferences were held to monitor patient's progress, set goals and discuss barriers to discharge. At admission, patient required min assist supine to sit mod assist sit to supine  Physical exam.  Blood pressure 144/76 pulse 59 temperature 97.8 respirations 16 oxygen saturation 96% room air Constitutional.  Fatigued HEENT Head.  Craniotomy site clean and dry Eyes.  Pupils round and reactive to light no discharge without nystagmus Neck.  Supple nontender no JVD without thyromegaly Cardiac regular rate rhythm not extra sounds or murmur heard Abdomen.  Soft nontender positive bowel sounds without rebound Respiratory effort normal no respiratory distress without wheeze Neurologic.  Patient somewhat somnolent makes eye contact with examiner he does provide his name.  He/She  has had improvement in activity tolerance, balance, postural control as well as ability to compensate for deficits. He/She has had improvement in functional use RUE/LUE  and RLE/LLE as well as improvement in awareness as of 04/30/2021 patient required increased time to process initiate scooting to edge of bed when prompted but requires moderate assist to complete transfers.  He can sit edge of bed with slowly flexing trunk requiring verbal cues.  Form sit to stand transfers moderate assist and hand-held assist.  Patient able to stand x30 seconds verbal cues for upright posture.  He was limited by fatigue.       Disposition: Discharged to acute care services    Diet: Regular  Special Instructions as well as medications at discharge: As per hospitalist team      Signed: Lavon [place] 05/02/2021, 2:24 PM

## 2021-05-02 NOTE — Discharge Summary (Deleted)
  The note originally documented on this encounter has been moved the the encounter in which it belongs.  

## 2021-05-02 NOTE — Evaluation (Addendum)
Clinical/Bedside Swallow Evaluation Patient Details  Name: Shannon Horton. MRN: [id number] Date of Birth: [birth year]  Today's Date: 05/02/2021 Time: SLP Start Time (ACUTE ONLY): 1215 SLP Stop Time (ACUTE ONLY): 1233 SLP Time Calculation (min) (ACUTE ONLY): 18 min  Past Medical History:  Past Medical History:  Diagnosis Date   Allergic rhinitis    gets shots per Dr. Velora Heckler   Allergy    sees Dr. Harold Hedge    Asthma    Glaucoma    sees Dr. Satira Sark    Hypertension    Malignant melanoma [facility])    right lower leg, diagnosed on1/21/11   Past Surgical History:  Past Surgical History:  Procedure Laterality Date   APPLICATION OF CRANIAL NAVIGATION N/A 03/31/2021   Procedure: APPLICATION OF CRANIAL NAVIGATION;  Surgeon: Karsten Ro, DO;  Location: [place];  Service: Neurosurgery;  Laterality: N/A;   CATARACT EXTRACTION     right ey, Dr. Satira Sark    COLONOSCOPY  12/15/2015   per Dr.Kaplan, clear, no repeats    CRANIOTOMY N/A 03/31/2021   Procedure: RIGHT FRONTAL CRANIOTOMY FOR TUMOR;  Surgeon: Karsten Ro, DO;  Location: [place];  Service: Neurosurgery;  Laterality: N/A;   ESOPHAGOGASTRODUODENOSCOPY     WITH ESOPHAGEAL DILATION 2006 PER dR. kAPLAN   REMOVAL  of melanoma  12/08/09   from right calf; per Dr. Monica Becton   removal of melanoma from left abdomen  01/21/10    per Dr. Denice Bors CELL CARCINOMA EXCISION  2013   per Dr. Terrence Dupont, from top of scalp    HPI:  75 y.o. 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 Was working with OT on CIR when he had an episode of acute unresponsiveness lasting approximately 2 minutes, right mouth droop, and left hemiparesis. He was admitted back to acute care 7/2.  CTH revealed a 5 mm new area of hypodensity in the right  temporal lobe concerning for hemorrhage associated with metastasis. Pt was working with SLP on CIR to address cognitive goals. He has not been dysphagic and has been eating a regular diet per chart review.  Orders for cognitive-language evaluation written 7/2. [place] 7/2 at 2100, however he was not started on a diet and there were apparently concerns that he was not safe to begin POs, hence SLP swallow eval was ordered.  Assessment / Plan / Recommendation Clinical Impression  Pt presented with functional oropharyngeal swallow with slowed but adequate mastication, palpable swallow response, no s/s of aspiration (even when taxed with mixed solid and liquid consistencies). He was able to feed himself with RUE.  He appeared to protect his airway well throughout assessment.  He WILL NEED PROMPTS during meals to continue eating/drinking, as his cognitive impairments interfere with his ability to initiate and sustain activity.  However, there are no concerns for an oropharyngeal dysphagia. Recommend resuming a regular diet, thin liquids, meds whole with water.  SLP to swallow for cognition only.  D/W wife, Therapist, sports. SLP Visit Diagnosis: Dysphagia, unspecified (R13.10)    Aspiration Risk    minimal   Diet Recommendation   Regular solids, thin liquids. NEEDS ENCOURAGEMENT TO EAT  Medication Administration: Whole meds with liquid    Other  Recommendations Oral Care Recommendations: Oral care BID   Follow up Recommendations Inpatient Rehab  Frequency and Duration min 2x/week          Prognosis        Swallow Study   General Date of Onset: 05/01/21 HPI: 75 y.o. 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 Was working with OT on CIR when he had an episode of acute  unresponsiveness lasting approximately 2 minutes, right mouth droop, and left hemiparesis. He was admitted back to acute care 7/2.  CTH revealed a 5 mm new area of hypodensity in the right temporal lobe concerning for hemorrhage associated with metastasis. Pt was working with SLP on CIR to address cognitive goals. He has not been dysphagic and has been eating a regular diet per chart review.  Orders for cognitive-language evaluation written 7/2. Passed [employer] Screen. Type of Study: Bedside Swallow Evaluation Diet Prior to this Study: NPO Temperature Spikes Noted: No Respiratory Status: Room air History of Recent Intubation: No Behavior/Cognition: Alert;Cooperative;Confused Oral Cavity Assessment: Within Functional Limits Oral Care Completed by SLP: No Oral Cavity - Dentition: Adequate natural dentition Vision: Functional for self-feeding Self-Feeding Abilities: Able to feed self Patient Positioning: Upright in bed Baseline Vocal Quality: Low vocal intensity Volitional Cough: Weak Volitional Swallow: Able to elicit    Oral/Motor/Sensory Function Overall Oral Motor/Sensory Function: Within functional limits   Ice Chips Ice chips: Within functional limits   Thin Liquid Thin Liquid: Within functional limits    Nectar Thick Nectar Thick Liquid: Not tested   Honey Thick Honey Thick Liquid: Not tested   Puree Puree: Within functional limits   Solid     Solid: Within functional limits      [name] 05/02/2021,1:37 PM  Estill Bamberg L. Tivis Ringer, [place] Office number [phone] Pager [phone]

## 2021-05-02 NOTE — Progress Notes (Signed)
Neurology Progress Note  Brief HPI: Shannon Horton. is a 75 y.o. male with a PMHx of HTN, tobacco use, and malignant melanoma initially removed from the right calf and left flank in 2011 with metastasis to the brain, right adrenal gland, and lung found in May of 2022. He was evaluated in May of 2022 for persistent headaches, balance disturbance, and left hemiparesis when an MRI brain showed enhancing and hemorrhagic mass in the right basal ganglia/frontal lobe region with a 5 mm leftward midline shift s/p R frontal craniotomy for tumor resection on 03/31/2021 and was in CIR. 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   Interval History: On 05/01/2021 the patient was working with OT when he suddenly became unresponsive, falling back into the bed, with an initial R gaze, with R lean, R facial droop, and had L hemiparesis. STAT CTH revealed a new 5 mm area of R temporal hyperdensity concerning for hemorrhage associated with metastasis and he was transferred back to an inpatient floor for further monitoring.   Subjective: No further acute overnight events following Code Stroke yesterday afternoon EEG complete with evidence suggesting cortical dysfunction from the left frontal region, likely secondary to underlying structural abnormality and moderate to severe diffuse encephalopathy, nonspecific etiology. EEG was without seizures or definite epileptiform discharges.   Exam: Vitals:   05/02/21 0740 05/02/21 0804  BP:    Pulse:    Resp:    Temp: 97.6 F (36.4 C)   SpO2:  94%   Gen: Laying in bed, sleeping, in no acute distress Resp: non-labored breathing, no respiratory distress Abd: soft, non-tender, non-distended  Neuro: Mental Status: awake, watching tv, diminished attntion/concentration Told us he is in a hospital, could not name it, or name the city. Got age  correct Speech is limited and hypophonic. No aphasia or neglect noted.  Cranial Nerves: PERRL 3 mm / brisk, will fixate and track examiner in lateral visual fields, facial sensation is intact and symmetric to light touch, face is asymmetric with minimal decreased elevation of the left mouth with smiling, hearing is intact to voice, speech is hypophonic, palate rises symmetrically, does not shrug shoulders to command, head appears midline, tongue protrudes midline Motor: antigravity in all 4s with coaching and generalized deconditioning. No asterixis.  Sensory: Sensation intact and symmetric to light touch in bilateral upper and lower extremities  DTR: 1+ and symmetric biceps and patellae Gait: Deferred  Pertinent Labs: CBC    Component Value Date/Time   WBC 7.3 05/02/2021 0104   RBC 5.14 05/02/2021 0104   HGB 16.4 05/02/2021 0104   HCT 50.1 05/02/2021 0104   PLT 186 05/02/2021 0104   MCV 97.5 05/02/2021 0104   MCH 31.9 05/02/2021 0104   MCHC 32.7 05/02/2021 0104   RDW 12.8 05/02/2021 0104   LYMPHSABS 1.3 05/02/2021 0104   MONOABS 0.6 05/02/2021 0104   EOSABS 0.0 05/02/2021 0104   BASOSABS 0.1 05/02/2021 0104   CMP     Component Value Date/Time   NA 132 (L) 05/02/2021 0104   K 5.3 (H) 05/02/2021 0104   CL 100 05/02/2021 0104   CO2 21 (L) 05/02/2021 0104   GLUCOSE 124 (H) 05/02/2021 0104   BUN 21 05/02/2021 0104   CREATININE 0.55 (L) 05/02/2021 0104   CREATININE 0.82 09/10/2020 1045   CALCIUM 9.6 05/02/2021 0104   PROT 5.6 (L) 04/22/2021 0865  ALBUMIN 3.3 (L) 04/22/2021 0433   AST 18 04/22/2021 0433   ALT 33 04/22/2021 0433   ALKPHOS 66 04/22/2021 0433   BILITOT 1.1 04/22/2021 0433   GFRNONAA >60 05/02/2021 0104   GFRNONAA 87 09/10/2020 1045   GFRAA 101 09/10/2020 1045   Urinalysis    Component Value Date/Time   COLORURINE YELLOW (A) 04/27/2021 1455   APPEARANCEUR CLOUDY (A) 04/27/2021 1455   LABSPEC 1.013 04/27/2021 1455   PHURINE 7.0 04/27/2021 1455   GLUCOSEU  NEGATIVE 04/27/2021 1455   GLUCOSEU NEGATIVE 03/24/2021 1526   HGBUR NEGATIVE 04/27/2021 1455   HGBUR negative 09/14/2010 0844   BILIRUBINUR NEGATIVE 04/27/2021 1455   BILIRUBINUR neg 12/25/2019 [place] 04/27/2021 1455   PROTEINUR NEGATIVE 04/27/2021 1455   UROBILINOGEN 1.0 03/24/2021 1526   NITRITE NEGATIVE 04/27/2021 1455   LEUKOCYTESUR NEGATIVE 04/27/2021 1455   Ammonia 05/01/2021: 19   Imaging Reviewed:  MRI brain with and without contrast 05/01/2021: 1. Slightly larger 4 mm metastatic lesion of the anterior left insula. 2. Decreased size of enhancing focus at the right caudate head. 3. 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 4. Decreased contrast enhancement at the right frontal resection site, likely resolving postoperative change.  CT-scan of the brain CODE STROKE 05/01/2021: New 5 mm area of hyperdensity in the right temporal lobe. There is punctate corresponding enhancement on the prior MRI and this may reflect hemorrhage associated with a metastasis. Small volume residual hemorrhage within the occipital horns. Increased thin subdural hygroma along the right falx. Evolving postoperative changes and adjacent subacute infarction  Routine EEG 05/01/2021: "This study is suggestive of cortical dysfunction arising from left frontal region, likely secondary to underlying structural abnormality. Additionally there is moderate to severe diffuse encephalopathy, non specific etiology. No seizures or definite epileptiform discharges were seen throughout the recording."   Assessment: 75 y.o. 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 - Examination reveals patient with fluctuating levels of alertness with generalized weakness without asymmetry noted in his extremities with persistent less elevation of left mouth with smiling.  - CTH 05/01/2021 revealed a 5 mm new area of hypodensity in the right temporal lobe concerning for hemorrhage associated with metastasis.  MRI brain with and without contrast revealed a slightly larger metastatic lesion of the anterior left insula.  - EEG obtained: suggestive of cortical dysfunction from the left frontal region, likely secondary to underlying structural abnormality. EEG also showed moderate to severe diffuse encephalopathy without seizures or epileptiform discharges.  - Presentation likely related to metastasis with hemorrhage versus seizure with post-ictal state.  - Etiology of hemorrhage 2/2 known melanoma metastasis to the brain. Metastatic melanoma is notorious for hemorrhagic conversion.   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 Mickeal Skinner, who has seen him recently and  get his recs from a neuro-onc perspective.  Inpatient neurology will be available as needed.  Anibal Henderson, AGACNP-BC [facility] [phone]   Attending Neurohospitalist Addendum Patient seen and examined with APP/Resident. Agree with the history and physical as documented above. Agree with the plan as documented, which I helped formulate. I have independently reviewed the chart, obtained history,  review of systems and examined the patient.I have personally reviewed pertinent head/neck/spine imaging (CT/MRI). Please feel free to call with any questions.  -- Amie [place], MD Neurologist [facility] Pager: [phone]

## 2021-05-02 NOTE — Progress Notes (Signed)
PROGRESS NOTE    Shannon Horton.  EHM:[id number] DOB: 1946/06/09 DOA: 05/01/2021 PCP: Laurey Morale, MD     No chief complaint on file.   Brief Narrative:   Shannon Horton. is a 75 y.o. 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  Where he had an episode of loss of consciousness, Code stroke was called, CT head showed new intracranial bleeding of a 5 mm area of hypodensity in the right temporal lobe.  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 -Presents with an episode of loss of consciousness/worsening mental status secondary to new hemorrhagic metastasis in the right temporal lobe -Discussed with Dr. Mickeal Skinner of, at this point recommendation will be to avoid anticoagulation, antiplatelet therapy, to continue with steroids, to monitor clinically, otherwise no other interventions indicated, and depends on clinical progression then further planning regarding SRS as an outpatient can be pursued if he is to improve. -Neurology input greatly appreciated, continue with Keppra for seizure prophylaxis, increase to 500 mg twice daily. -Systolic blood pressure less than 160 -Continue with IV Decadron -PT/OT evaluation. -SLP input appreciated, he is started on diet   HTN -Home BP meds plus as  needed hydralazine   Hyponatremia -Continue sodium tablet.   Asthma -Stable   Seizure -Continue Keppra   GI prophylaxis -Continue PPI  DVT prophylaxis: SCD Code Status: DNR Family Communication:None at bedside Disposition:   Status is: Inpatient  Remains inpatient appropriate because:Altered mental status, Ongoing diagnostic testing needed not appropriate for outpatient work up, and Unsafe d/c plan  Dispo: The patient is from:  CIR              Anticipated d/c is to: CIR              Patient currently is not medically stable to d/c.   Difficult to place patient No       Consultants:  Neurology Discussed with neurooncology Dr. Mickeal Skinner   Subjective:  Significant events as discussed with staff, he passed a swallow evaluation earlier today  Objective: Vitals:   05/02/21 0421 05/02/21 0740 05/02/21 0804 05/02/21 1220  BP: 110/71   126/81  Pulse: 68   74  Resp: 16   17  Temp: 98 F (36.7 C) 97.6 F (36.4 C)  98.4 F (36.9 C)  TempSrc: Oral Axillary  Oral  SpO2: 95%  94% 98%  Weight:      Height:        Intake/Output Summary (Last 24 hours) at 05/02/2021 1426 Last data filed at 05/02/2021 1100 Gross per 24 hour  Intake 501.99 ml  Output 1350 ml  Net -848.01 ml   Filed Weights   05/01/21 1747  Weight: 63.5 kg    Examination:  Patient wakes up, answer simple questions, but overall altered and fall asleep  Symmetrical Chest wall movement, Good air movement bilaterally, CTAB RRR,No Gallops,Rubs or new Murmurs, No Parasternal Heave +  ve B.Sounds, Abd Soft, No tenderness, No rebound - guarding or rigidity. No Cyanosis, Clubbing or edema, No new Rash or bruise       Data Reviewed: I have personally reviewed following labs and imaging studies  CBC: Recent Labs  Lab 04/28/21 0515 05/02/21 0104  WBC 6.6 7.3  NEUTROABS  --  5.3  HGB 15.1 16.4  HCT 43.8 50.1  MCV 90.5 97.5  PLT 319 694    Basic Metabolic Panel: Recent Labs  Lab 04/28/21 0515  04/29/21 0455 04/30/21 0500 05/02/21 0104  NA 130* 132* 134* 132*  K 4.2 4.3 4.1 5.3*  CL 95* 98 100 100  CO2 25 26 25  21*  GLUCOSE 136* 121* 151* 124*  BUN 20 28* 27* 21  CREATININE 0.61 0.74 0.59* 0.55*  CALCIUM 9.9 9.7 9.8 9.6    GFR: Estimated Creatinine Clearance: 71.7 mL/min (A) (by C-G formula based on SCr of 0.55 mg/dL (L)).  Liver Function Tests: No results for input(s): AST, ALT, ALKPHOS, BILITOT, PROT, ALBUMIN in the last 168 hours.  CBG: Recent Labs  Lab 05/01/21 1451  GLUCAP 183*     No results found for this or any previous visit (from the past 240 hour(s)).       Radiology Studies: MR BRAIN W WO CONTRAST  Result Date: 05/01/2021 CLINICAL DATA:  Stroke follow-up EXAM: MRI HEAD WITHOUT AND WITH CONTRAST TECHNIQUE: Multiplanar, multiecho pulse sequences of the brain and surrounding structures were obtained without and with intravenous contrast. CONTRAST:  6.49mL GADAVIST GADOBUTROL 1 MMOL/ML IV SOLN COMPARISON:  04/19/2021 FINDINGS: Brain: Unchanged area of diffusion abnormality the right frontal lobe at the resection site. There is decreased contrast enhancement in this region since the prior study. Enhancing focus of the right caudate head is decreased in size, now measuring 9 x 3 mm, previously 15 x 7 mm. There are multiple chronic microhemorrhages in a predominantly peripheral distribution. There is also hemosiderin deposition at the right basal ganglia. These findings unchanged. Hyperintense T2-weighted signal is widespread throughout the white matter. Diffuse, severe atrophy. The midline structures are normal. 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 There is a 4 mm contrast enhancing lesion of the anterior left insula (21:28), slightly larger than on the previous study. Vascular: Major flow voids are preserved. Skull and upper cervical spine: Remote right frontal craniotomy. Sinuses/Orbits:No paranasal sinus fluid levels or  advanced mucosal thickening. No mastoid or middle ear effusion. Normal orbits. IMPRESSION: 1. Slightly larger 4 mm metastatic lesion of the anterior left insula. 2. Decreased size of enhancing focus at the right caudate head. 3. 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 4. Decreased contrast enhancement at the right frontal resection site, likely resolving postoperative change. Electronically Signed   By: Ulyses Jarred M.D.   On: 05/01/2021 20:53   EEG adult  Result Date: 05/01/2021 Lora Havens, MD     05/01/2021  5:16 PM Patient Name: Shannon Horton. MRN: [id number] Epilepsy Attending: Lora Havens Referring Physician/Provider: Anibal Henderson, NP Date: 05/01/2021 Duration: 22.25 mins Patient history: 75yo M with acute unresponsiveness, right facial weakness, left hemiparesis. EEG to evaluate for seizure Level of alertness:  lethargic AEDs during EEG study: Ativan, LEV Technical aspects: This EEG study was done with scalp electrodes positioned according to the 10-20 International system of electrode placement. Electrical activity was acquired at a sampling rate of 500Hz  and reviewed with a high frequency filter of 70Hz  and  a low frequency filter of 1Hz . EEG data were recorded continuously and digitally stored. Description: EEG showed continuous generalized and maximal right frontal polymorphic mixed frequencies with predominantly 5 to 6 Hz theta as well as intermittent 2-3Hz  delta slowing admixed with 15-18Hz  generalized beta activity. Hyperventilation and photic stimulation were not performed.   ABNORMALITY - Continuous slow, generalized and maximal right frontal IMPRESSION: This study is suggestive of cortical dysfunction arising from left frontal region, likely secondary to underlying structural abnormality. Additionally there is moderate to severe diffuse encephalopathy, non specific etiology. No seizures or definite epileptiform discharges were seen throughout the  recording. Lora Havens   CT HEAD CODE STROKE WO CONTRAST  Result Date: 05/01/2021 CLINICAL DATA:  Code stroke. EXAM: CT HEAD WITHOUT CONTRAST TECHNIQUE: Contiguous axial images were obtained from the base of the skull through the vertex without intravenous contrast. COMPARISON:  CT 04/18/2021, MRI 04/19/2021 FINDINGS: Brain: There is a new 5 mm area of hyperdensity of the right temporal lobe (series 3, image 16). Punctate area of corresponding enhancement is noted on the prior MRI. Evolving postoperative changes in the right lentiform nucleus region. Adjacent low-attenuation extending to the right frontal horn likely corresponds to area of subacute infarction on MRI. Ill-defined hyperdensity could reflect petechial hemorrhage. Stable left choroid plexus lesion. Small volume residual hemorrhage within the occipital horns. Thin subdural hygroma along the right falx has increased since the MRI. No new loss of gray-white differentiation. Prominence of the ventricles and sulci reflects generalized cerebral and cerebellar parenchymal volume loss. Superimposed communicating hydrocephalus is not excluded. Vascular: No hyperdense vessel. Skull: Right frontal craniotomy. Sinuses/Orbits: No acute abnormality. Other: Mastoid air cells are clear. IMPRESSION: New 5 mm area of hyperdensity in the right temporal lobe. There is punctate corresponding enhancement on the prior MRI and this may reflect hemorrhage associated with a metastasis. Small volume residual hemorrhage within the occipital horns. Increased thin subdural hygroma along the right falx. Evolving postoperative changes and adjacent subacute infarction. Initial results were communicated to Dr. Rory Percy at 3:21 pm on 05/01/2021 by text page via the [facility] messaging system. Electronically Signed   By: Macy Mis M.D.   On: 05/01/2021 15:29        Scheduled Meds:   stroke: mapping our early stages of recovery book   Does not apply Once   amLODipine  5 mg Oral  Daily   brinzolamide  1 drop Both Eyes BID   budesonide  0.5 mg Nebulization BID   dexamethasone (DECADRON) injection  4 mg Intravenous Q6H   hydrochlorothiazide  25 mg Oral Daily   latanoprost  1 drop Both Eyes QHS   levETIRAcetam  500 mg Oral BID   methylphenidate  5 mg Oral BID WC   pantoprazole  40 mg Oral QHS   sodium chloride  2 g Oral BID WC   Continuous Infusions:  sodium chloride 50 mL/hr at 05/02/21 0841     LOS: 1 day       Phillips Climes, MD [facility]   To contact the attending provider between 7A-7P or the covering provider during after hours 7P-7A, please log into the web site [email or web address] and access using universal [place] password for that web site. If you do not have the password, please call the hospital operator.  05/02/2021, 2:26 PM

## 2021-05-02 NOTE — Progress Notes (Signed)
PT Cancellation Note  Patient Details Name: Corbett Moulder. MRN: [id number] DOB: 22-May-1946   Cancelled Treatment:    Reason Eval/Treat Not Completed: Medical issues which prohibited therapy. Per neurology consult, CTH revealed a 5 mm new area of hypodensity in the right temporal lobe concerning for hemorrhage associated with metastasis. Per protocol for IVH/ICH, will allow 24-hour bedrest prior to initiation of therapy.   Lorriane Shire 05/02/2021, 12:56 PM  Lorrin Goodell, PT  Office # [phone] Pager [phone]

## 2021-05-02 NOTE — Evaluation (Signed)
Speech Language Pathology Evaluation Patient Details Name: Shannon Horton. 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 Velora Heckler   Allergy    sees Dr. Harold Hedge    Asthma    Glaucoma    sees Dr. Satira Sark    Hypertension    Malignant melanoma [facility])    right lower leg, diagnosed on1/21/11   Past Surgical History:  Past Surgical History:  Procedure Laterality Date   APPLICATION OF CRANIAL NAVIGATION N/A 03/31/2021   Procedure: APPLICATION OF CRANIAL NAVIGATION;  Surgeon: Karsten Ro, DO;  Location: [place];  Service: Neurosurgery;  Laterality: N/A;   CATARACT EXTRACTION     right ey, Dr. Satira Sark    COLONOSCOPY  12/15/2015   per Dr.Kaplan, clear, no repeats    CRANIOTOMY N/A 03/31/2021   Procedure: RIGHT FRONTAL CRANIOTOMY FOR TUMOR;  Surgeon: Karsten Ro, DO;  Location: [place];  Service: Neurosurgery;  Laterality: N/A;   ESOPHAGOGASTRODUODENOSCOPY     WITH ESOPHAGEAL DILATION 2006 PER dR. kAPLAN   REMOVAL  of melanoma  12/08/09   from right calf; per Dr. Monica Becton   removal of melanoma from left  abdomen  01/21/10    per Dr. Denice Bors CELL CARCINOMA EXCISION  2013   per Dr. Terrence Dupont, from top of scalp    HPI:  75 y.o. 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 Was working with OT on CIR when he had an episode of acute unresponsiveness lasting approximately 2 minutes, right mouth droop, and left hemiparesis. He was admitted back to acute care 7/2.  CTH revealed a 5 mm new area of hypodensity in the right temporal lobe concerning for hemorrhage associated with metastasis. Pt was working with SLP on CIR to address cognitive goals. He has not been dysphagic and has been eating a regular diet per chart review.  Orders for cognitive-language evaluation written 7/2. Passed [employer] Screen.   Assessment / Plan / Recommendation Clinical Impression  Pt participated in cognitive-linguistic evaluation. His performance appeared to be relatively consistent with SLP notes from last session on CIR. His wife endorses increased delays in response time and decreased initiation. He does present with significant delays in initiation of speech and motor output. He required max cues to initiate and sustain activity through end of task.  There were significant deficits in working memory and orientation.  Speech was low in volume, but fluent and without dysarthria.  Expressive/receptive language  appeared to be relatively intact.  Recommend resuming SLP for cognition while pt is still in acute care.  D/W Mrs. Fan.  Will follow.    SLP Assessment  SLP Recommendation/Assessment: Patient needs continued Speech Lanaguage Pathology Services SLP Visit Diagnosis: Cognitive communication deficit (R41.841)    Follow Up Recommendations  Inpatient Rehab    Frequency and Duration min 2x/week   2 weeks      SLP Evaluation Cognition  Overall Cognitive Status: Impaired/Different from baseline Arousal/Alertness: Awake/alert Orientation Level: Oriented to person;Disoriented to time;Disoriented to situation Attention: Sustained Sustained Attention: Impaired Sustained Attention Impairment: Verbal basic;Functional basic Memory: Impaired Memory Impairment: Storage deficit;Retrieval deficit Decreased Short Term Memory: Verbal basic Awareness: Impaired Awareness Impairment: Intellectual impairment Problem Solving Impairment: Verbal basic Safety/Judgment: Impaired       Comprehension  Auditory Comprehension Overall Auditory Comprehension: Appears within functional limits for tasks assessed Visual Recognition/Discrimination Discrimination: Not tested Reading Comprehension Reading Status: Not tested    Expression Expression Primary Mode of Expression: Verbal Verbal Expression Overall Verbal Expression: Appears within functional limits for tasks assessed Written Expression Dominant Hand: Right Written Expression: Not tested   Oral / Motor  Oral Motor/Sensory Function Overall Oral Motor/Sensory Function: Within functional limits Motor Speech Overall Motor Speech: Appears within functional limits for tasks assessed   GO                   [name] L. Tivis Ringer, [place] Office number [phone] Pager [phone]  [name] 05/02/2021, 1:29 PM

## 2021-05-02 NOTE — Progress Notes (Signed)
PT asked if this RN was aware the night RN put the patient on an NPO diet. This RN was not aware and was told in bedside report that the patient had passed the bedside [employer].  Sent message to Dr. Waldron Labs, asking if we can resume his diet.

## 2021-05-03 ENCOUNTER — Other Ambulatory Visit: Payer: Self-pay

## 2021-05-03 DIAGNOSIS — I629 Nontraumatic intracranial hemorrhage, unspecified: Secondary | ICD-10-CM

## 2021-05-03 DIAGNOSIS — Z7189 Other specified counseling: Secondary | ICD-10-CM

## 2021-05-03 DIAGNOSIS — Z515 Encounter for palliative care: Secondary | ICD-10-CM

## 2021-05-03 DIAGNOSIS — C799 Secondary malignant neoplasm of unspecified site: Secondary | ICD-10-CM

## 2021-05-03 LAB — CBC
HCT: 43.2 % (ref 39.0–52.0)
Hemoglobin: 14.9 g/dL (ref 13.0–17.0)
MCH: 31.8 pg (ref 26.0–34.0)
MCHC: 34.5 g/dL (ref 30.0–36.0)
MCV: 92.1 fL (ref 80.0–100.0)
Platelets: 209 10*3/uL (ref 150–400)
RBC: 4.69 MIL/uL (ref 4.22–5.81)
RDW: 12.5 % (ref 11.5–15.5)
WBC: 9.8 10*3/uL (ref 4.0–10.5)
nRBC: 0 % (ref 0.0–0.2)

## 2021-05-03 LAB — BASIC METABOLIC PANEL
Anion gap: 10 (ref 5–15)
BUN: 20 mg/dL (ref 8–23)
CO2: 26 mmol/L (ref 22–32)
Calcium: 9.2 mg/dL (ref 8.9–10.3)
Chloride: 99 mmol/L (ref 98–111)
Creatinine, Ser: 0.75 mg/dL (ref 0.61–1.24)
GFR, Estimated: >60 mL/min (ref >60)
Glucose, Bld: 159 mg/dL — ABNORMAL HIGH (ref 70–99)
Potassium: 4 mmol/L (ref 3.5–5.1)
Sodium: 135 mmol/L (ref 135–145)

## 2021-05-03 NOTE — H&P (Signed)
Occupational Therapy Evaluation Patient Details Name: Shannon Horton. MRN: [id number] DOB: 23-Jun-1946 Today's Date: 05/03/2021    History of Present Illness 75yo male admitted 05/01/21 after becoming unresponsive at [facility]. Transferred to acute as a code CVA and found to have new intracranial bleed R temporal lobe on CT. PMH HTN, malignant melanoma, craniotmy, HTN   Clinical Impression   Pt was seen for co-evaluation with PT today. He was admitted following decline in status when on CIR and admitted to acute following new intracranial bleed R temporal lobe. Please review chart for full PMH. Pt presents with confusion, decreased awareness/awareness of deficits and significant left lateral lean in sitting, standing and when supine in bed. He is currently Max A +2 for sit to stand, Max A sitting EOB w/ consistent verbal and tactile cues, secondary to left lateral and posterior lean. He should benefit from cont acute OT services to assist with increasing independence with ADL's and functional transfers. Recommend CIR consult pending family level of support and activity tolerance.   Follow Up Recommendations  CIR (pending pt activity tolerance and participation)    Equipment Recommendations  Other (comment) (Defer to next venue)    Recommendations for Other Services Rehab consult     Precautions / Restrictions Precautions Precautions: Fall Precaution Comments: R crani, new R temporal bleed Restrictions Weight Bearing Restrictions: No Other Position/Activity Restrictions: Pt with significant left lateral lean noted      Mobility Bed Mobility Overal bed mobility: Needs Assistance Bed Mobility: Supine to Sit;Sit to Supine     Supine to sit: Max assist;+2 for physical assistance Sit to supine: Max assist;+2 for physical assistance   General bed mobility comments: very little initiation noted- able to start to wiggle BLEs off EOB, then needed MaxAx2 to bring BLEs around and trunk up to  midline. Reverse for return to supine.    Transfers Overall transfer level: Needs assistance Equipment used: Rolling walker (2 wheeled) Transfers: Sit to/from Stand Sit to Stand: Max assist;+2 physical assistance         General transfer comment: heavy maxAx2 to boost all the way to standing position in midline- very hard ongoing lean to the left with limited ability to correct    Balance Overall balance assessment: Needs assistance;History of Falls Sitting-balance support: Feet supported Sitting balance-Leahy Scale: Poor Sitting balance - Comments: strong L lean needing as much as maxA to maintain midline; able to maintain midline for short bursts of 10-15 seconds at a time until fatigue increased Postural control: Left lateral lean Standing balance support: Bilateral upper extremity supported;During functional activity Standing balance-Leahy Scale: Zero Standing balance comment: very strong left lean with no attempts to correct, or awareness                           ADL either performed or assessed with clinical judgement   ADL Overall ADL's : Needs assistance/impaired Eating/Feeding: Set up;Supervision/ safety;Sitting   Grooming: Wash/dry hands;Sitting;Maximal assistance (Max assist sit at EOB 2* left lateral lean and pt unaware)   Upper Body Bathing: Bed level;Minimal assistance;Moderate assistance   Lower Body Bathing: Maximal assistance;Bed level;Cueing for safety;Cueing for sequencing   Upper Body Dressing : Maximal assistance;Sitting (2* left lateral lean, pt unaware, decreased safety/sequencing)   Lower Body Dressing: Maximal assistance;Sitting/lateral leans;Sit to/from stand;+2 for physical assistance;+2 for safety/equipment;Cueing for safety;Cueing for sequencing   Toilet Transfer: Maximal assistance;Total assistance;+2 for physical assistance;+2 for safety/equipment;BSC;RW   Toileting- Clothing Manipulation and  Hygiene: Total assistance;+2 for  physical assistance;+2 for safety/equipment;Cueing for safety;Cueing for sequencing;Sit to/from stand   Tub/ Banker:  (TBD)   Functional mobility during ADLs: Maximal assistance;+2 for physical assistance;+2 for safety/equipment;Rolling walker (Sit to stand in preparation for ADL's) General ADL Comments: Pt having s/s of confusion and lethergy, decreased safety awareness, awareness of deficits, difficulty following 1 step commands, beneftis from verbal and tactile cues, increased time for processing.     Vision Baseline Vision/History: Wears glasses Wears Glasses: At all times Patient Visual Report: Other (comment) (Cont to assess in functional context during therapy) Vision Assessment?: Vision impaired- to be further tested in functional context     Perception Perception Inattention/Neglect: Impaired- to be further tested in functional context   [place] tested?: Deficits Deficits: Initiation;Organization    Pertinent Vitals/Pain Pain Assessment: Faces Faces Pain Scale: No hurt Pain Intervention(s): Limited activity within patient's tolerance;Monitored during session     Hand Dominance Right   Extremity/Trunk Assessment Upper Extremity Assessment Upper Extremity Assessment: Defer to OT evaluation LUE Deficits / Details: Pt presenting with decreased active ROM and use of LUE, however, was able to grasp/grip (grossly -3/5), biceps, triceps grossly -3/5. Able to reach behind his head with increased time and demonstration, verbal and tactile cues. LUE Coordination: decreased fine motor;decreased gross motor   Lower Extremity Assessment Lower Extremity Assessment: Generalized weakness (no better than 3/5 bilateral LEs)   Cervical / Trunk Assessment Cervical / Trunk Assessment: Kyphotic   Communication Communication Communication: Other (comment) (Pt slow to respond, confused at times, difficulty answering questions, no family present for assessment)    Cognition Arousal/Alertness: Awake/alert Behavior During Therapy: Flat affect Overall Cognitive Status: Impaired/Different from baseline Area of Impairment: Attention;Safety/judgement;Problem solving;Following commands;Awareness;Orientation                 Orientation Level: Disoriented to;Place;Time;Situation Current Attention Level: Sustained Memory: Decreased short-term memory Following Commands: Follows one step commands inconsistently;Follows one step commands with increased time Safety/Judgement: Decreased awareness of safety;Decreased awareness of deficits Awareness: Intellectual Problem Solving: Decreased initiation;Difficulty sequencing;Requires verbal cues General Comments: very short attention span and also limited ability to retain information. Asked what city we were in and perseverated on answer of [place], then tells [place] that the pile of sheets in the corner must be from [place] too. Limited awareness of deficits- tells [place] he guesses he is upright even when leaning left   General Comments       Exercises     Shoulder Instructions      Home Living Family/patient expects to be discharged to:: Unsure (Pt admitted from In-pt Rehab/CIR. ?24/assist vs return to CIR. No family present during assessment) Living Arrangements: Spouse/significant other Available Help at Discharge: Family;Available 24 hours/day Type of Home: House Home Access: Stairs to enter [employer] of Steps: 1 Entrance Stairs-Rails: None Home Layout: One level;Other (Comment)     Bathroom Shower/Tub: Walk-in shower;Tub/shower unit (walk in shower has door, tub has curtain)   Bathroom Toilet: Standard Bathroom Accessibility: Yes How Accessible: Accessible via walker Home Equipment: [place] - 2 wheels;Cane - single point;Hand held shower head   Additional Comments: taken from prior charting- patient unable to provide history  Lives With: Spouse    Prior Functioning/Environment Level of  Independence: Independent with assistive device(s)        Comments: pt reports using cane progressing to RW for ambulation intermittently PTA, recent history of multiple falls over the past 6 weeks. Prior to 6 weeks ago, pt biking 20+  miles on bike. (all information taken from chart from previous evaluation)        OT Problem List: Decreased strength;Decreased activity tolerance;Impaired balance (sitting and/or standing);Impaired vision/perception;Decreased coordination;Decreased cognition;Decreased safety awareness;Decreased knowledge of use of DME or AE;Impaired UE functional use;Decreased knowledge of precautions      OT Treatment/Interventions: Self-care/ADL training;Therapeutic exercise;Neuromuscular education;DME and/or AE instruction;Therapeutic activities;Cognitive remediation/compensation;Visual/perceptual remediation/compensation;Patient/family education;Balance training    OT Goals(Current goals can be found in the care plan section) Acute Rehab OT Goals Patient Stated Goal: did not state OT Goal Formulation: Patient unable to participate in goal setting Time For Goal Achievement: 05/31/21 Potential to Achieve Goals: Fair ADL Goals Pt Will Perform Grooming: with min assist;sitting Pt Will Perform Upper Body Bathing: with min assist;sitting Pt Will Perform Upper Body Dressing: with min assist;sitting Pt Will Transfer to Toilet: with min assist;stand pivot transfer;bedside commode Pt Will Perform Toileting - Clothing Manipulation and hygiene: with min assist;sitting/lateral leans;sit to/from stand  OT Frequency: Min 2X/week   Barriers to D/C: Other (comment) (No family present to discuss discharge options/preferences.)          Co-evaluation PT/OT/SLP Co-Evaluation/Treatment: Yes Reason for Co-Treatment: Complexity of the patient's impairments (multi-system involvement);For patient/therapist safety;To address functional/ADL transfers   OT goals addressed during session:  ADL's and self-care;Other (comment) (Sitting, standing balance and assessment)      AM-PAC OT "6 Clicks" Daily Activity     Outcome Measure Help from another person eating meals?: A Little Help from another person taking care of personal grooming?: A Lot Help from another person toileting, which includes using toliet, bedpan, or urinal?: Total Help from another person bathing (including washing, rinsing, drying)?: A Lot Help from another person to put on and taking off regular upper body clothing?: A Lot Help from another person to put on and taking off regular lower body clothing?: Total 6 Click Score: 11   End of Session Equipment Utilized During Treatment: Gait belt;Rolling walker Nurse Communication: Mobility status;Other (comment) (Pt requesting to call, mother and brothers. Confused, no family present)  Activity Tolerance: Patient limited by fatigue;Patient limited by lethargy Patient left: in bed;with call bell/phone within reach;with bed alarm set  OT Visit Diagnosis: Unsteadiness on feet (R26.81);Muscle weakness (generalized) (M62.81);Repeated falls (R29.6);Other symptoms and signs involving cognitive function;Other symptoms and signs involving the nervous system (R29.898)                Time: 8280-0349 OT Time Calculation (min): 32 min Charges:  OT General Charges $OT Visit: 1 Visit OT Evaluation $OT Eval Moderate Complexity: 1 Mod  Korey Arroyo Beth Dixon, OTR/L 05/03/2021, 12:48 PM

## 2021-05-03 NOTE — Evaluation (Signed)
Physical Therapy Evaluation Patient Details Name: Shannon Horton. MRN: [id number] DOB: [birth year] Today's Date: 05/03/2021   History of Present Illness  75yo male admitted 05/01/21 after becoming unresponsive at [facility]. Transferred to acute as a code CVA and found to have new intracranial bleed R temporal lobe on CT. PMH HTN, malignant melanoma, craniotmy, HTN  Clinical Impression   Patient received in bed, pleasantly confused and with significant need for increased processing time. Easily distractible and with poor retention/recall, limited awareness of safety and overall deficits. Needed MaxAx2 for all aspects of mobility today. Has very strong left lean in all positions when upright- sometimes able to maintain midline sitting for short bursts (maybe 10-15 seconds) but then needs as much as MaxA to maintain midline. Left in bed with all needs met, bed alarm active. May benefit most from skilled rehab f/u in SNF setting at this point.     Follow Up Recommendations SNF;Supervision/Assistance - 24 hour    Equipment Recommendations  Hospital bed;Wheelchair (measurements PT);Wheelchair cushion (measurements PT);Other (comment);3in1 (PT) (TIS WC, hoyer lift and pads)    Recommendations for Other Services       Precautions / Restrictions Precautions Precautions: Fall Precaution Comments: R crani, new R temporal bleed Restrictions Weight Bearing Restrictions: No      Mobility  Bed Mobility Overal bed mobility: Needs Assistance Bed Mobility: Supine to Sit;Sit to Supine     Supine to sit: Max assist;+2 for physical assistance Sit to supine: Max assist;+2 for physical assistance   General bed mobility comments: very little initiation noted- able to start to wiggle BLEs off EOB, then needed MaxAx2 to bring BLEs around and trunk up to midline. Reverse for return to supine.    Transfers Overall transfer level: Needs assistance Equipment used: Rolling walker (2 wheeled) Transfers: Sit  to/from Stand Sit to Stand: Max assist;+2 physical assistance         General transfer comment: heavy maxAx2 to boost all the way to standing position in midline- very hard ongoing lean to the left with limited ability to correct  Ambulation/Gait             General Gait Details: unable  Stairs            Wheelchair Mobility    Modified Rankin (Stroke Patients Only)       Balance Overall balance assessment: Needs assistance;History of Falls Sitting-balance support: Feet supported Sitting balance-Leahy Scale: Poor Sitting balance - Comments: strong L lean needing as much as maxA to maintain midline; able to maintain midline for short bursts of 10-15 seconds at a time until fatigue increased Postural control: Left lateral lean Standing balance support: Bilateral upper extremity supported;During functional activity Standing balance-Leahy Scale: Zero Standing balance comment: very strong left lean with no attempts to correct, or awareness                             Pertinent Vitals/Pain Pain Assessment: Faces Faces Pain Scale: No hurt Pain Intervention(s): Limited activity within patient's tolerance;Monitored during session    Home Living     Available Help at Discharge: Family;Available 24 hours/day Type of Home: House Home Access: Stairs to enter Entrance Stairs-Rails: None Entrance Stairs-Number of Steps: 1 Home Layout: One level;Other (Comment)   Additional Comments: taken from prior charting- patient unable to provide history    Prior Function           Comments: pt reports using cane progressing  to RW for ambulation intermittently PTA, recent history of multiple falls over the past 6 weeks. Prior to 6 weeks ago, pt biking 20+ miles on bike. (all information taken from chart from previous evaluation)     Hand Dominance        Extremity/Trunk Assessment   Upper Extremity Assessment Upper Extremity Assessment: Defer to OT  evaluation    Lower Extremity Assessment Lower Extremity Assessment: Generalized weakness (no better than 3/5 bilateral LEs)    Cervical / Trunk Assessment Cervical / Trunk Assessment: Kyphotic  Communication      Cognition Arousal/Alertness: Awake/alert Behavior During Therapy: Flat affect Overall Cognitive Status: Impaired/Different from baseline Area of Impairment: Attention;Safety/judgement;Problem solving;Following commands;Awareness;Orientation                 Orientation Level: Disoriented to;Place;Time;Situation Current Attention Level: Sustained Memory: Decreased short-term memory Following Commands: Follows one step commands inconsistently;Follows one step commands with increased time Safety/Judgement: Decreased awareness of safety;Decreased awareness of deficits Awareness: Intellectual Problem Solving: Decreased initiation;Difficulty sequencing;Requires verbal cues General Comments: very short attention span and also limited ability to retain information. Asked what city we were in and perseverated on answer of [place], then tells [place] that the pile of sheets in the corner must be from [place] too. Limited awareness of deficits- tells [place] he guesses he is upright even when leaning left      General Comments      Exercises     Assessment/Plan    PT Assessment Patient needs continued PT services  PT Problem List Decreased strength;Decreased mobility;Decreased safety awareness;Decreased activity tolerance;Decreased balance;Decreased knowledge of use of DME;Decreased cognition;Pain;Cardiopulmonary status limiting activity;Decreased knowledge of precautions       PT Treatment Interventions DME instruction;Therapeutic activities;Gait training;Therapeutic exercise;Patient/family education;Balance training;Stair training;Functional mobility training;Neuromuscular re-education    PT Goals (Current goals can be found in the Care Plan section)  Acute Rehab PT Goals Patient  Stated Goal: did not state PT Goal Formulation: Patient unable to participate in goal setting Time For Goal Achievement: 05/17/21 Potential to Achieve Goals: Fair    Frequency Min 3X/week   Barriers to discharge        Co-evaluation PT/OT/SLP Co-Evaluation/Treatment: Yes Reason for Co-Treatment: Complexity of the patient's impairments (multi-system involvement);For patient/therapist safety;To address functional/ADL transfers   OT goals addressed during session: ADL's and self-care;Other (comment) (Sitting, standing balance and assessment)       AM-PAC PT "6 Clicks" Mobility  Outcome Measure Help needed turning from your back to your side while in a flat bed without using bedrails?: A Little Help needed moving from lying on your back to sitting on the side of a flat bed without using bedrails?: Total Help needed moving to and from a bed to a chair (including a wheelchair)?: Total Help needed standing up from a chair using your arms (e.g., wheelchair or bedside chair)?: Total Help needed to walk in hospital room?: Total Help needed climbing 3-5 steps with a railing? : Total 6 Click Score: 8    End of Session Equipment Utilized During Treatment: Gait belt Activity Tolerance: Patient tolerated treatment well Patient left: in bed;with call bell/phone within reach;with bed alarm set Nurse Communication: Mobility status PT Visit Diagnosis: Other abnormalities of gait and mobility (R26.89);Difficulty in walking, not elsewhere classified (R26.2)    Time: 1655-3748 PT Time Calculation (min) (ACUTE ONLY): 32 min   Charges:   PT Evaluation $PT Eval Moderate Complexity: 1 Mod (co-eval with OT)         Cyril Mourning U  PT, DPT, PN1   Supplemental Physical Therapist     Pager [phone] Acute Rehab Office [phone]    

## 2021-05-03 NOTE — Progress Notes (Addendum)
PROGRESS NOTE    Shannon Horton.  JOI:[id number] DOB: Oct 05, 1946 DOA: 05/01/2021 PCP: Laurey Morale, MD     No chief complaint on file.   Brief Narrative:   Shannon Horton. is a 75 y.o. 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  Where he had an episode of loss of consciousness, Code stroke was called, CT head showed new intracranial bleeding of a 5 mm area of hypodensity in the right temporal lobe.  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 -Presents with an episode of loss of consciousness/worsening mental status secondary to new hemorrhagic metastasis in the right temporal lobe -Discussed with Dr. Mickeal Skinner on  7/3 , at this point recommendation will be to avoid anticoagulation, antiplatelet therapy, to continue with steroids, and to continue with Keppra for seizure prophylaxis, to monitor clinically, otherwise no other interventions indicated, and depends on clinical progression then further planning regarding SRS as an outpatient can be pursued if he is to improve. -Neurology input greatly appreciated, continue with Keppra for seizure prophylaxis, increase to 500 mg twice daily. -Systolic blood pressure less than 160 -Continue with IV Decadron -PT/OT  evaluation. -SLP input appreciated, he is started on diet  Metastatic melanoma with brain metastasis status post craniotomy resection of right frontal tumor 03/31/2021. -Patient to follow-up with Dr. Alen Blew as an outpatient   HTN -Home BP meds plus as needed hydralazine -Blood pressure is soft this morning, so I have stopped his home medication including Micardis, and Norvasc   Hyponatremia -Hydrochlorothiazide has been stopped, within normal range today.   Asthma -Stable   Seizure -Continue Keppra   GI prophylaxis -Continue PPI  DVT prophylaxis: SCD Code Status: DNR Family Communication:None at bedside Disposition:   Status is: Inpatient  Remains inpatient appropriate because:Altered mental status, Ongoing diagnostic testing needed not appropriate for outpatient work up, and Unsafe d/c plan  Dispo: The patient is from:  [facility]              Anticipated d/c is to: [facility]              Patient currently is not medically stable to d/c.   Difficult to place patient No       Consultants:  Neurology palliative Discussed with neurooncology Dr. Mickeal Skinner   Subjective:  No significant events as discussed with staff, patient himself  denies any complaints today, but he is with impaired judgment and insight  Objective: Vitals:   05/03/21 0400 05/03/21 0748 05/03/21 0753 05/03/21 1100  BP: 105/65 111/74  113/77  Pulse: 60 65  68  Resp: 16 18  18   Temp: 98.2 F (36.8 C) 98.4 F (36.9 C)  98.8 F (37.1 C)  TempSrc: Oral Oral  Oral  SpO2: 95% 98% 97% 90%  Weight:      Height:        Intake/Output Summary (Last 24 hours) at 05/03/2021  [place] filed at 05/03/2021 0444 Gross per 24 hour  Intake 480 ml  Output 3100 ml  Net -2620 ml   Filed Weights   05/01/21 1747  Weight: 63.5 kg    Examination:  Patient is more awake, responsive and talkative today, but he remains oriented x1, significantly confused and impaired judgment and insight, frail, deconditioned.    Symmetrical Chest wall movement, Good air movement bilaterally, CTAB RRR,No Gallops,Rubs or new Murmurs, No Parasternal Heave +ve B.Sounds, Abd Soft, No tenderness, No rebound - guarding or rigidity. No Cyanosis, Clubbing or edema, No new Rash or bruise      Data Reviewed: I have personally reviewed following labs and imaging studies  CBC: Recent Labs  Lab 04/28/21 0515 05/02/21 0104 05/03/21 1027  WBC 6.6 7.3 9.8  NEUTROABS  --  5.3  --   HGB 15.1 16.4 14.9  HCT 43.8 50.1 43.2  MCV 90.5 97.5 92.1  PLT 319 186 967    Basic Metabolic Panel: Recent Labs  Lab 04/28/21 0515 04/29/21 0455 04/30/21 0500 05/02/21 0104 05/03/21 1027  NA 130* 132* 134* 132* 135  K 4.2 4.3 4.1 5.3* 4.0  CL 95* 98 100 100 99  CO2 25 26 25  21* 26  GLUCOSE 136* 121* 151* 124* 159*  BUN 20 28* 27* 21 20  CREATININE 0.61 0.74 0.59* 0.55* 0.75  CALCIUM 9.9 9.7 9.8 9.6 9.2    GFR: Estimated Creatinine Clearance: 71.7 mL/min (by C-G formula based on SCr of 0.75 mg/dL).  Liver Function Tests: No results for input(s): AST, ALT, ALKPHOS, BILITOT, PROT, ALBUMIN in the last 168 hours.  CBG: Recent Labs  Lab 05/01/21 1451  GLUCAP 183*     No results found for this or any previous visit (from the past 240 hour(s)).       Radiology Studies: MR BRAIN W WO CONTRAST  Result Date: 05/01/2021 CLINICAL DATA:  Stroke follow-up EXAM: MRI HEAD WITHOUT AND WITH CONTRAST TECHNIQUE: Multiplanar, multiecho pulse sequences of the brain and surrounding structures were obtained without and with intravenous contrast. CONTRAST:  6.42mL GADAVIST GADOBUTROL 1 MMOL/ML IV SOLN COMPARISON:  04/19/2021 FINDINGS: Brain: Unchanged area of diffusion abnormality the right frontal lobe at the resection site. There is decreased contrast enhancement in this region since the prior study. Enhancing focus of the right caudate head is decreased in size, now measuring 9 x 3 mm, previously 15 x 7 mm. There are multiple chronic  microhemorrhages in a predominantly peripheral distribution. There is also hemosiderin deposition at the right basal ganglia. These findings unchanged. Hyperintense T2-weighted signal is widespread throughout the white matter. Diffuse, severe atrophy. The midline structures are normal. 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 There is a 4 mm contrast enhancing lesion of the anterior left insula (21:28), slightly larger than on the previous study. Vascular: Major flow voids are preserved. Skull and upper cervical spine: Remote right frontal craniotomy. Sinuses/Orbits:No paranasal sinus fluid levels or advanced mucosal thickening. No mastoid or middle ear effusion. Normal orbits. IMPRESSION: 1. Slightly larger 4 mm metastatic lesion of the anterior left insula. 2. Decreased size of enhancing focus at the right caudate head. 3. 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 4. Decreased contrast enhancement at the right frontal resection site, likely resolving postoperative change. Electronically Signed   By: Ulyses Jarred M.D.   On: 05/01/2021 20:53   EEG adult  Result Date: 05/01/2021 Lora Havens, MD  05/01/2021  5:16 PM Patient Name: Shannon Horton. MRN: [id number] Epilepsy Attending: Lora Havens Referring Physician/Provider: Anibal Henderson, NP Date: 05/01/2021 Duration: 22.25 mins Patient history: 75yo M with acute unresponsiveness, right facial weakness, left hemiparesis. EEG to evaluate for seizure Level of alertness:  lethargic AEDs during EEG study: Ativan, LEV Technical aspects: This EEG study was done with scalp electrodes positioned according to the 10-20 International system of electrode placement. Electrical activity was acquired at a sampling rate of 500Hz  and reviewed with a high frequency filter of 70Hz  and a low frequency filter of 1Hz . EEG data were recorded continuously and digitally stored. Description: EEG showed  continuous generalized and maximal right frontal polymorphic mixed frequencies with predominantly 5 to 6 Hz theta as well as intermittent 2-3Hz  delta slowing admixed with 15-18Hz  generalized beta activity. Hyperventilation and photic stimulation were not performed.   ABNORMALITY - Continuous slow, generalized and maximal right frontal IMPRESSION: This study is suggestive of cortical dysfunction arising from left frontal region, likely secondary to underlying structural abnormality. Additionally there is moderate to severe diffuse encephalopathy, non specific etiology. No seizures or definite epileptiform discharges were seen throughout the recording. Lora Havens   CT HEAD CODE STROKE WO CONTRAST  Result Date: 05/01/2021 CLINICAL DATA:  Code stroke. EXAM: CT HEAD WITHOUT CONTRAST TECHNIQUE: Contiguous axial images were obtained from the base of the skull through the vertex without intravenous contrast. COMPARISON:  CT 04/18/2021, MRI 04/19/2021 FINDINGS: Brain: There is a new 5 mm area of hyperdensity of the right temporal lobe (series 3, image 16). Punctate area of corresponding enhancement is noted on the prior MRI. Evolving postoperative changes in the right lentiform nucleus region. Adjacent low-attenuation extending to the right frontal horn likely corresponds to area of subacute infarction on MRI. Ill-defined hyperdensity could reflect petechial hemorrhage. Stable left choroid plexus lesion. Small volume residual hemorrhage within the occipital horns. Thin subdural hygroma along the right falx has increased since the MRI. No new loss of gray-white differentiation. Prominence of the ventricles and sulci reflects generalized cerebral and cerebellar parenchymal volume loss. Superimposed communicating hydrocephalus is not excluded. Vascular: No hyperdense vessel. Skull: Right frontal craniotomy. Sinuses/Orbits: No acute abnormality. Other: Mastoid air cells are clear. IMPRESSION: New 5 mm area of  hyperdensity in the right temporal lobe. There is punctate corresponding enhancement on the prior MRI and this may reflect hemorrhage associated with a metastasis. Small volume residual hemorrhage within the occipital horns. Increased thin subdural hygroma along the right falx. Evolving postoperative changes and adjacent subacute infarction. Initial results were communicated to Dr. Rory Percy at 3:21 pm on 05/01/2021 by text page via the [facility] messaging system. Electronically Signed   By: Macy Mis M.D.   On: 05/01/2021 15:29        Scheduled Meds:   stroke: mapping our early stages of recovery book   Does not apply Once   brinzolamide  1 drop Both Eyes BID   budesonide  0.5 mg Nebulization BID   dexamethasone (DECADRON) injection  4 mg Intravenous Q6H   latanoprost  1 drop Both Eyes QHS   levETIRAcetam  500 mg Oral BID   methylphenidate  5 mg Oral BID WC   pantoprazole  40 mg Oral QHS   sodium chloride  2 g Oral BID WC   Continuous Infusions:     LOS: 2 days       Phillips Climes, MD [facility]   To contact the attending provider between 7A-7P or the covering  provider during after hours 7P-7A, please log into the web site [email or web address] and access using universal [place] password for that web site. If you do not have the password, please call the hospital operator.  05/03/2021, 3:02 PM
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
## 2021-05-03 NOTE — Progress Notes (Signed)
Neurology Progress Note  S: Wife states patient has done well today. He has been more alert and talkative.   O: Current vital signs: BP 129/80 (BP Location: Right Arm)   Pulse 69   Temp 97.7 F (36.5 C) (Oral)   Resp 15   Ht '5\' 8"'  (1.727 m)   Wt 63.5 kg   SpO2 93%   BMI 21.30 kg/m  Vital signs in last 24 hours: Temp:  [97.7 F (36.5 C)-98.8 F (37.1 C)] 97.7 F (36.5 C) (07/04 1632) Pulse Rate:  [60-70] 69 (07/04 1632) Resp:  [14-18] 15 (07/04 1632) BP: (105-132)/(65-80) 129/80 (07/04 1632) SpO2:  [90 %-100 %] 93 % (07/04 1632)  GENERAL: chronically ill  appearing in NAD. HEENT: Normocephalic and atraumatic. Well healed scar to right frontal area.  LUNGS: Normal respiratory effort.  Ext: warm.  NEURO:  Mental Status: Alert. Oriented to self, age, wife, hospital, city, state, month and year.   Speech/Language: speech is without aphasia or dysarthria but is hypophonic. Naming, repetition, fluency, and comprehension intact.  Cranial Nerves:  PERRL 74m, will fixate and tack NP in lateral visual fields, facial sensation is intact to light touch and symmetrical. Minute left facial droop. Tongue protrudes midline. Head is midline.   Motor: R bicep  4   tricep 4  grip 4             L bicep  4-   tricep 4-   grip 4-             RLE/LLE-can not lift off of bed. Knee is 4/5. Thigh is with    minimal effort. Dorsiflexion/plantar flexion is 4/5.  Sensation: intact and symmetrical to light touch.   Medications  Current Facility-Administered Medications:     stroke: mapping our early stages of recovery book, , Does not apply, Once, ZWynetta FinesT, MD   acetaminophen (TYLENOL) tablet 650 mg, 650 mg, Oral, Q4H PRN **OR** acetaminophen (TYLENOL) 160 MG/5ML solution 650 mg, 650 mg, Per Tube, Q4H PRN **OR** acetaminophen (TYLENOL) suppository 650 mg, 650 mg, Rectal, Q4H PRN, ZWynetta FinesT, MD   albuterol (PROVENTIL) (2.5 MG/3ML) 0.083% nebulizer solution 2.5 mg, 2.5 mg, Nebulization, Q4H  PRN, ZRoosevelt Locks Ping T, MD   brinzolamide (AZOPT) 1 % ophthalmic suspension 1 drop, 1 drop, Both Eyes, BID, ZWynetta FinesT, MD, 1 drop at 05/03/21 0833   budesonide (PULMICORT) nebulizer solution 0.5 mg, 0.5 mg, Nebulization, BID, ZWynetta FinesT, MD, 0.5 mg at 05/03/21 0751   dexamethasone (DECADRON) injection 4 mg, 4 mg, Intravenous, Q6H, AAmie Portland MD, 4 mg at 05/03/21 1628   hydrALAZINE (APRESOLINE) injection 5 mg, 5 mg, Intravenous, Q6H PRN, ZWynetta FinesT, MD   HYDROcodone-acetaminophen (NORCO/VICODIN) 5-325 MG per tablet 1 tablet, 1 tablet, Oral, Q4H PRN, ZWynetta FinesT, MD, 1 tablet at 05/02/21 2313   latanoprost (XALATAN) 0.005 % ophthalmic solution 1 drop, 1 drop, Both Eyes, QHS, ZWynetta FinesT, MD, 1 drop at 05/02/21 2134   levETIRAcetam (KEPPRA) tablet 500 mg, 500 mg, Oral, BID, AAmie Portland MD, 500 mg at 05/03/21 00312  methylphenidate (RITALIN) tablet 5 mg, 5 mg, Oral, BID WC, ZWynetta FinesT, MD, 5 mg at 05/03/21 1256   pantoprazole (PROTONIX) EC tablet 40 mg, 40 mg, Oral, QHS, ZWynetta FinesT, MD, 40 mg at 05/02/21 2135   senna-docusate (Senokot-S) tablet 1 tablet, 1 tablet, Oral, QHS PRN, ZWynetta FinesT, MD   sodium chloride tablet 2 g, 2 g, Oral, BID WC, Zhang,  Ralene Cork, MD, 2 g at 05/03/21 1628  No new Imaging [facility] 05/01/21:  New 5 mm area of hyperdensity in the right temporal lobe. There is punctate corresponding enhancement on the prior MRI and this may reflect hemorrhage associated with a metastasis. Small volume residual hemorrhage within the occipital horns. Increased thin subdural hygroma along the right falx. Evolving postoperative changes and adjacent subacute infarction.   MRI brain 05/01/21 Slightly larger 4 mm metastatic lesion of the anterior left insula. Decreased size of enhancing focus at the right caudate head. 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 Decreased contrast enhancement at the right frontal resection site, likely resolving  postoperative change.   Assessment: Unfortunate 75 yo male with metastatic brain lesions s/p unresponsive episode with finding of new hemorrhage associated with a met. In reading of notes/exams, his encephalopathy has improved. Due to his brain metastases, it will likely be difficulty for him to return to a totally normal mental status. Continuing the Keppra long term is reasonable given his tumor/mets/hemorrhages put him at risk for seizures.   Impression: -metastatic melanoma to the brain, lung, and adrenal gland. -s/p code stroke with new hemorrhagic met found.  -concern for seizure 2/2 brain metastases.  -s/p right frontal craniotomy for tumor resection 03/31/21.   Recommendations/Plan:  -continue Keppra at 573m po bid indefinitely.   -continue inpatient seizure precautions.  -continue to hold antiplatelet and/or blood thinning medications.  -Goal BP < 160.  -Continue Decadron 422mIV q6 hours.  -Neurology will be available if further questions.   Pt seen by KaClance BollMSN, APN-BC/Nurse Practitioner/Neuro and later by MD. Note and plan to be edited as needed by MD.  Pager: [phone] I have seen the patient and reviewed the above note.  He apparently has been doing better today, there has been some waxing/waning, likely due to mild delirium.  Steroids may be playing some role in this, but unfortunately needs some.  I would continue Keppra though if delirium becomes a persistent problem, could consider changing to Depakote.  Neurology will be available on an as-needed basis moving forward, please call with further questions or concerns.  McRoland RackMD [facility] [phone] 7pm- 7am, please page neurology on call as listed in [facility]

## 2021-05-04 ENCOUNTER — Ambulatory Visit: Payer: Medicare PPO | Admitting: Radiation Oncology

## 2021-05-04 ENCOUNTER — Inpatient Hospital Stay (HOSPITAL_COMMUNITY): Payer: Medicare PPO

## 2021-05-04 ENCOUNTER — Ambulatory Visit: Payer: Medicare PPO

## 2021-05-04 DIAGNOSIS — R4182 Altered mental status, unspecified: Secondary | ICD-10-CM

## 2021-05-04 DIAGNOSIS — C7800 Secondary malignant neoplasm of unspecified lung: Secondary | ICD-10-CM

## 2021-05-04 DIAGNOSIS — G9340 Encephalopathy, unspecified: Secondary | ICD-10-CM

## 2021-05-04 DIAGNOSIS — C7970 Secondary malignant neoplasm of unspecified adrenal gland: Secondary | ICD-10-CM

## 2021-05-04 LAB — CBC
HCT: 44 % (ref 39.0–52.0)
Hemoglobin: 15.5 g/dL (ref 13.0–17.0)
MCH: 32 pg (ref 26.0–34.0)
MCHC: 35.2 g/dL (ref 30.0–36.0)
MCV: 90.7 fL (ref 80.0–100.0)
Platelets: 191 10*3/uL (ref 150–400)
RBC: 4.85 MIL/uL (ref 4.22–5.81)
RDW: 12.2 % (ref 11.5–15.5)
WBC: 9.2 10*3/uL (ref 4.0–10.5)
nRBC: 0 % (ref 0.0–0.2)

## 2021-05-04 LAB — BASIC METABOLIC PANEL
Anion gap: 8 (ref 5–15)
BUN: 16 mg/dL (ref 8–23)
CO2: 27 mmol/L (ref 22–32)
Calcium: 9.3 mg/dL (ref 8.9–10.3)
Chloride: 98 mmol/L (ref 98–111)
Creatinine, Ser: 0.56 mg/dL — ABNORMAL LOW (ref 0.61–1.24)
GFR, Estimated: >60 mL/min (ref >60)
Glucose, Bld: 123 mg/dL — ABNORMAL HIGH (ref 70–99)
Potassium: 3.6 mmol/L (ref 3.5–5.1)
Sodium: 133 mmol/L — ABNORMAL LOW (ref 135–145)

## 2021-05-04 NOTE — Progress Notes (Addendum)
PROGRESS NOTE    [name]  MLY:[id number] DOB: 10-10-1946 DOA: 05/01/2021 PCP: Laurey Morale, MD     No chief complaint on file.   Brief Narrative:   Shannon Horton. is a 75 y.o. 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  Where he had an episode of loss of consciousness, Code stroke was called, CT head showed new intracranial bleeding of a 5 mm area of hypodensity in the right temporal lobe.  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 -Presents with an episode of loss of consciousness/worsening mental status secondary to new hemorrhagic metastasis in the right temporal lobe -Overall patient with significant encephalopathy, with significant fluctuation, but overall has been warted worsening mentation over the last few weeks since his surgery, will to be secondary to brain metastasis, recent surgery, steroids and hospital delirium. -Neurology input greatly appreciated, continue with IV steroids, and Keppra for seizure prophylaxis. -Plan for LTM EEG today to rule out any underlining seizure activity contributing to his encephalopathy.  Metastatic melanoma to brain, adrenal gland and lungs, with brain metastasis status post craniotomy resection of right frontal  tumor 03/31/2021. -He was seen by Dr. Lindi Adie last admission, supposed to see Dr. Alen Blew as an outpatient regarding further recommendation, but patient was never discharged to make that appointment . -He is followed by Dr. Mickeal Skinner as well, with plan for postsurgical SRS, and they are trying to work on timing.    HTN -Blood pressure is soft this morning, so I have stopped his home medication including Micardis, and Norvasc, he remains on prn hydralazine.   Hyponatremia -Hydrochlorothiazide has been stopped, within normal range today. - continue with salt tabs.   Asthma -Stable   Seizure -Continue Keppra   GI prophylaxis -Continue PPI  DVT prophylaxis: SCD, will hold on chemical DVT prophylaxis currently given metastatic lesions hemorrhage Code Status: DNR Family Communication: D/W wife  at bedside Disposition:   Status is: Inpatient  Remains inpatient appropriate because:Altered mental status, Ongoing diagnostic testing needed not appropriate for outpatient work up, and Unsafe d/c plan  Dispo: The patient is from:  [facility]              Anticipated d/c is to: SNF              Patient currently is not medically stable to d/c.   Difficult to place patient No       Consultants:  Neurology palliative Discussed with neurooncology Dr. Mickeal Skinner oncology   Subjective:  Significant events overnight as discussed with staff, patient himself denies any complaints.  Objective: Vitals:   05/04/21 0400 05/04/21 0809 05/04/21 0836 05/04/21 1321  BP: 138/79 (!) 144/110  (!) 108/97  Pulse: 68 78 66 63  Resp: 16 11 14 10   Temp: 98 F (36.7 C) 97.6 F (36.4 C)  97.7 F (36.5 C)  TempSrc: Oral Axillary  Axillary  SpO2: 95% 95% 97% 95%  Weight:      Height:        Intake/Output Summary (Last 24 hours) at 05/04/2021 1351 Last data filed at 05/04/2021 0813 Gross per 24 hour  Intake 90 ml  Output 450 ml  Net -360 ml   Filed Weights   05/01/21 1747  Weight: 63.5 kg     Examination:  Awake Alert, extremely weak, frail and deconditioned, oriented x2, but impaired cognition and insight.   Symmetrical Chest wall movement, Good air movement bilaterally, CTAB RRR,No Gallops,Rubs or new Murmurs, No Parasternal Heave +ve B.Sounds, Abd Soft, No tenderness, No rebound - guarding or rigidity. No Cyanosis, Clubbing or edema, No new Rash or bruise     Data Reviewed: I have personally reviewed following labs and imaging studies  CBC: Recent Labs  Lab 04/28/21 0515 05/02/21 0104 05/03/21 1027 05/04/21 0113  WBC 6.6 7.3 9.8 9.2  NEUTROABS  --  5.3  --   --   HGB 15.1 16.4 14.9 15.5  HCT 43.8 50.1 43.2 44.0  MCV 90.5 97.5 92.1 90.7  PLT 319 186 209 546    Basic Metabolic Panel: Recent Labs  Lab 04/29/21 0455 04/30/21 0500 05/02/21 0104 05/03/21 1027 05/04/21 0113  NA 132* 134* 132* 135 133*  K 4.3 4.1 5.3* 4.0 3.6  CL 98 100 100 99 98  CO2 26 25 21* 26 27  GLUCOSE 121* 151* 124* 159* 123*  BUN 28* 27* 21 20 16   CREATININE 0.74 0.59* 0.55* 0.75 0.56*  CALCIUM 9.7 9.8 9.6 9.2 9.3    GFR: Estimated Creatinine Clearance: 71.7 mL/min (A) (by C-G formula based on SCr of 0.56 mg/dL (L)).  Liver Function Tests: No results for input(s): AST, ALT, ALKPHOS, BILITOT, PROT, ALBUMIN in the last 168 hours.  CBG: Recent Labs  Lab 05/01/21 1451  GLUCAP 183*     No results found for this or any previous visit (from the past 240 hour(s)).       Radiology Studies: No results found.      Scheduled Meds:   stroke: mapping our early stages of recovery book   Does not apply Once   brinzolamide  1 drop Both Eyes BID   budesonide  0.5 mg Nebulization BID   dexamethasone (DECADRON) injection  4 mg Intravenous Q6H   latanoprost  1 drop Both Eyes QHS   levETIRAcetam  500 mg Oral BID   methylphenidate  5 mg Oral BID WC   pantoprazole  40 mg Oral QHS   sodium chloride  2 g Oral BID WC   Continuous Infusions:     LOS: 3 days        Phillips Climes, MD [facility]   To contact the attending provider between 7A-7P or the covering provider during after hours 7P-7A, please log into the web site [email or web address] and access using universal [place] password for that web site. If you do not have the password, please call the hospital operator.  05/04/2021, 1:51 PM

## 2021-05-04 NOTE — TOC Initial Note (Signed)
Transition of Care (TOC) - Initial/Assessment Note    Patient Details  Name: Shannon Horton. MRN: [id number] Date of Birth: 07/29/1946  Transition of Care [facility]) CM/SW Contact:    Benard Halsted, LCSW Phone Number: 05/04/2021, 2:59 PM  Clinical Narrative:                 CSW received consult for possible SNF placement at time of discharge. CSW spoke with patient's spouse and son at bedside. Patient's spouse reproted that she is currently unable to care for patient at their home given patient's current physical needs and fall risk. She expressed understanding of PT recommendation and is agreeable to SNF placement at time of discharge with rehab. Family reports preference for [facility] as her family members have been there for years. CSW discussed insurance authorization process and provided Medicare SNF ratings list. Patient has received the COVID vaccines and boosters. CSW explained if insurance does not approve rehab, long term care would be discussed. No further questions reported at this time.   CSW left voicemail for Butch Penny at [facility] and sent referral out.   Skilled Nursing Rehab Facilities-   [email or web address] *Ratings updated quarterly   Ratings out of 5 possible   Name Address  Phone # [place] Inspection Overall  [facility] [address], [place] 5  4 4   [facility]  [address] [place], [place] [phone] 4 2 4 4   [facility] [place], [place] 2 3 1 1   [facility] [place], [place] 3 3 4 4   [facility] [address] [employer] 3  2 2   [facility] 2 2 4 4   [facility] [address] [place] 5 2 2 3   [facility] [address], [place] 4 2 2 2   [facility] [place], [place]  [phone] 5 2 2 3   [facility] [phone] [address], [place] [phone] 5 1 2 2   [facility] [place] [facility] [address] [facility] [phone] 5 2 2 3   [facility] [address] [place] [phone] 3 1 1 1      Expected Discharge Plan: Skilled Nursing Facility Barriers to Discharge: Insurance Authorization, SNF Pending bed offer, Continued Medical Work up   Patient Goals and CMS Choice Patient states their goals for this hospitalization and ongoing recovery are:: Rehab CMS Medicare.gov Compare Post Acute Care list provided to:: Patient Represenative (must comment) (spouse, son) Choice offered to / list presented to : Patient, Spouse, Adult Children  Expected Discharge Plan and Services Expected Discharge Plan: [place] In-house Referral: Clinical Social Work   Post Acute Care Choice: [place] Living arrangements for the past 2 months: [place]                                      Prior Living Arrangements/Services Living arrangements for the past 2 months: Single Family Home Lives with:: Spouse Patient language and need for interpreter reviewed:: Yes Do you feel safe going back to the place where you live?: No   need more care  Need for Family Participation in Patient Care: Yes (Comment) Care giver support system in place?: Yes (comment) Current home services: DME Criminal Activity/Legal Involvement Pertinent to Current Situation/Hospitalization: No - Comment as  needed  Activities of Daily Living Home Assistive Devices/Equipment: Cane (specify quad or straight), Shower chair with back ADL Screening (condition at time of admission) Patient's cognitive ability adequate to safely complete daily activities?: Yes Is the patient deaf or have difficulty hearing?: No Does the patient have difficulty seeing, even when wearing glasses/contacts?: No Does the patient have difficulty concentrating,  remembering, or making decisions?: Yes Patient able to express need for assistance with ADLs?: Yes Does the patient have difficulty dressing or bathing?: Yes Independently performs ADLs?: No Communication: Independent Dressing (OT): Needs assistance Is this a change from baseline?: Pre-admission baseline Grooming: Needs assistance Is this a change from baseline?: Pre-admission baseline Feeding: Needs assistance Is this a change from baseline?: Pre-admission baseline Bathing: Needs assistance Is this a change from baseline?: Pre-admission baseline Toileting: Needs assistance Is this a change from baseline?: Pre-admission baseline In/Out Bed: Needs assistance Is this a change from baseline?: Pre-admission baseline Does the patient have difficulty walking or climbing stairs?: Yes Weakness of Legs: Both Weakness of Arms/Hands: Both  Permission Sought/Granted Permission sought to share information with : Facility Sport and exercise psychologist, Family Supports Permission granted to share information with : Yes, Verbal Permission Granted  Share Information with NAME: [name]  Permission granted to share info w AGENCY: SNFs  Permission granted to share info w Relationship: Spouse  Permission granted to share info w Contact Information: [phone]  Emotional Assessment Appearance:: Appears stated age Attitude/Demeanor/Rapport: Gracious Affect (typically observed): Quiet Orientation: : Oriented to Self, Oriented to Place, Oriented to  Time, Oriented to Situation Alcohol / Substance Use: Not Applicable Psych Involvement: No (comment)  Admission diagnosis:  Intracranial hemorrhage ([place]) [I62.9] Patient Active Problem List   Diagnosis Date Noted   Intracranial hemorrhage ([place]) 05/01/2021   Malnutrition of moderate degree 04/23/2021   Malignant melanoma ([place]) 04/21/2021   IVH (intraventricular hemorrhage) ([place]) 04/18/2021   Depression due to physical illness    Brain tumor ([place]) 04/06/2021    S/P craniotomy 03/31/2021   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   Dyslipidemia 08/29/2017   Glaucoma 09/16/2015   MELANOMA, TRUNK 01/27/2010   MELANOMA, LEG, RIGHT 11/30/2009   Disorder of skin or subcutaneous tissue 11/11/2009   KNEE SPRAIN 06/23/2009   ELBOW SPRAIN 03/06/2008   Essential hypertension 06/07/2007   Allergic rhinitis 06/07/2007   Asthma 06/07/2007   PCP:  Laurey Morale, MD Pharmacy:   [id number] [place], [place] [address] Phone: [phone] Fax: [phone]     Social Determinants of Health (SDOH) Interventions    Readmission Risk Interventions Readmission Risk Prevention Plan 04/06/2021  Post Dischage Appt Complete  Medication Screening Complete  Transportation Screening Complete  Some recent data might be hidden

## 2021-05-04 NOTE — Progress Notes (Signed)
vLTM EEG started. Tested event button. Notified neuro

## 2021-05-04 NOTE — Progress Notes (Signed)
Palliative:  HPI: 75 y.o. 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   I met today with Shannon Horton and his wife along with Jordan Hawks, NP. Shannon Horton is more alert than he was this morning (we stopped by very briefly and he was very sleepy but did nod his head a couple times but struggling to open his eyes and breakfast untouched at bedside). He is more awake this afternoon and wife is at bedside. She reports that he ate some of his lunch and has been awake and alert for her visit. She reports that it is not uncommon for him to sleep through breakfast. Dr. Waldron Labs to bedside to provide update to wife about oncology input and EEG to rule out seizure activity.   We spoke with wife further about knowing our options for [place] and then weighing the risks vs the benefits of what will be helpful for him vs what may be putting him through more. When discussing the potential for interventions like radiation and chemotherapy she nods her head no. We did discussed CSW assistance to find facility and I mentioned to wife about the potential of having hospice support to focus on his comfort and quality of life and she does seem open to this option. She does note that she is counting on [place] to help guide her and help her decide what is going to be most beneficial for her husband.   We agree to follow up tomorrow once more information from oncology and EEG and continue conversations. All questions/concerns addressed. Emotional support provided. Ash[place] is present for conversation but unclear how much he understands or retains.   Exam: Alert, minimally verbal and when verbal not necessarily appropriate responses to questions. No distress. Thin, frail. Breathing regular, unlabored. Abd flat.    Plan: - Optimizing medically. - Will need long term care. Will continue to discuss the potential of adding hospice to care.   25 min  Vinie Sill, NP Palliative Medicine Team Pager [phone] (Please see [email or web address] for schedule) Team Phone [phone]    Greater than 50%  of this time was spent counseling and coordinating care related to the above assessment and plan

## 2021-05-04 NOTE — NC FL2 (Signed)
[place] LEVEL OF CARE SCREENING TOOL     IDENTIFICATION  Patient Name: Shannon Horton. Birthdate: 1945-12-19 Sex: male Admission Date (Current Location): 05/01/2021  [facility] and [place] Number:  Herbalist and Address:  [facility] [place], [place] [address], [place], [place] [address]      Provider Number: [id number]  Attending Physician Name and Address:  Albertine Patricia, MD  Relative Name and Phone Number:  [name], spouse, [phone]    Current Level of Care: Hospital Recommended Level of Care: [place] Prior Approval Number:    Date Approved/Denied:   PASRR Number: [id number]  Discharge Plan: SNF    Current Diagnoses: Patient Active Problem List   Diagnosis Date Noted   Intracranial hemorrhage ([place]) 05/01/2021   Malnutrition of moderate degree 04/23/2021   Malignant melanoma ([place]) 04/21/2021   IVH (intraventricular hemorrhage) ([place]) 04/18/2021   Depression due to physical illness    Brain tumor ([place]) 04/06/2021   S/P craniotomy 03/31/2021   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   Dyslipidemia 08/29/2017   Glaucoma 09/16/2015   MELANOMA, TRUNK 01/27/2010   MELANOMA, LEG, RIGHT 11/30/2009   Disorder of skin or subcutaneous tissue 11/11/2009   KNEE SPRAIN 06/23/2009   ELBOW SPRAIN 03/06/2008   Essential hypertension 06/07/2007   Allergic rhinitis 06/07/2007   Asthma 06/07/2007    Orientation RESPIRATION BLADDER Height & Weight     Self  Normal Incontinent, External catheter Weight: 140 lb 1.6 oz (63.5 kg) Height:  5\' 8"  (172.7 cm)  BEHAVIORAL SYMPTOMS/MOOD NEUROLOGICAL BOWEL NUTRITION STATUS      Continent Diet (Please see DC Summary)  AMBULATORY STATUS COMMUNICATION OF NEEDS Skin   Extensive Assist Verbally Normal                       Personal Care Assistance Level of Assistance  Bathing, Feeding, Dressing Bathing Assistance: Maximum assistance Feeding assistance:  Independent Dressing Assistance: Maximum assistance     Functional Limitations Info  Hearing, Sight Sight Info: Impaired Hearing Info: Impaired      SPECIAL CARE FACTORS FREQUENCY  PT (By licensed PT), OT (By licensed OT)     PT Frequency: 5x/week OT Frequency: 5x/week            Contractures Contractures Info: Not present    Additional Factors Info  Code Status, Allergies Code Status Info: DNR Allergies Info: [place]           Current Medications (05/04/2021):  This is the current hospital active medication list Current Facility-Administered Medications  Medication Dose Route Frequency Provider Last Rate Last Admin    stroke: mapping our early stages of recovery book   Does not apply Once Wynetta Fines T, MD       acetaminophen (TYLENOL) tablet 650 mg  650 mg Oral Q4H PRN Lequita Halt, MD       Or   acetaminophen (TYLENOL) 160 MG/5ML solution 650 mg  650 mg Per Tube Q4H PRN Lequita Halt, MD       Or   acetaminophen (TYLENOL) suppository 650 mg  650 mg Rectal Q4H PRN Wynetta Fines T, MD       albuterol (PROVENTIL) (2.5 MG/3ML) 0.083% nebulizer solution 2.5 mg  2.5 mg Nebulization Q4H PRN Wynetta Fines T, MD       brinzolamide (AZOPT) 1 % ophthalmic suspension 1 drop  1 drop Both Eyes BID Lequita Halt, MD   1  drop at 05/04/21 0826   budesonide (PULMICORT) nebulizer solution 0.5 mg  0.5 mg Nebulization BID Wynetta Fines T, MD   0.5 mg at 05/04/21 0836   dexamethasone (DECADRON) injection 4 mg  4 mg Intravenous Q6H Amie Portland, MD   4 mg at 05/04/21 1203   hydrALAZINE (APRESOLINE) injection 5 mg  5 mg Intravenous Q6H PRN Wynetta Fines T, MD       HYDROcodone-acetaminophen (NORCO/VICODIN) 5-325 MG per tablet 1 tablet  1 tablet Oral Q4H PRN Wynetta Fines T, MD   1 tablet at 05/02/21 2313   latanoprost (XALATAN) 0.005 % ophthalmic solution 1 drop  1 drop Both Eyes QHS Wynetta Fines T, MD   1 drop at 05/03/21 2117   levETIRAcetam (KEPPRA) tablet 500 mg  500 mg Oral BID Amie [place], MD    500 mg at 05/04/21 4403   methylphenidate (RITALIN) tablet 5 mg  5 mg Oral BID WC Wynetta Fines T, MD   5 mg at 05/04/21 1200   pantoprazole (PROTONIX) EC tablet 40 mg  40 mg Oral QHS Wynetta Fines T, MD   40 mg at 05/03/21 2117   senna-docusate (Senokot-S) tablet 1 tablet  1 tablet Oral QHS PRN Wynetta Fines T, MD       sodium chloride tablet 2 g  2 g Oral BID WC Lequita Halt, MD   2 g at 05/04/21 4742     Discharge Medications: Please see discharge summary for a list of discharge medications.  Relevant Imaging Results:  Relevant Lab Results:   Additional Information SSN: [id number]. [place] COVID-19 Vaccine 08/03/2020 , 03/24/2020 , 12/30/2019 , 12/05/2019  Benard Halsted, LCSW

## 2021-05-04 NOTE — Progress Notes (Signed)
  Speech Language Pathology Treatment: Cognitive-Linquistic  Patient Details Name: Shannon Horton. MRN: [id number] DOB: February 11, 1946 Today's Date: 05/04/2021 Time: 4920-1007 SLP Time Calculation (min) (ACUTE ONLY): 16 min  Assessment / Plan / Recommendation Clinical Impression  Pt alert, able to answer orientation questions about self and location [facility]), disoriented to time/circumstances.  Pt engaged in self-feeding last portion of breakfast with max verbal/visual/tactile cues needed to initiate and sustain attention to task.  Noted impersistence with light touch needed to prompt continued movement from tray to mouth.  Required mod verbal cues to respond verbally and increase volume to be understood.   NT reported some pocketing and perseveration with chewing.  Downgraded diet to dysphagia 3 after discussion with Dr. Waldron Labs.  SLP will continue to follow for cognition while in acute care.  HPI HPI: 75 y.o. 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 Was working with OT on CIR when he had an episode of acute unresponsiveness lasting approximately 2 minutes, right mouth droop, and left hemiparesis. He was admitted back to acute care 7/2.  CTH revealed a 5 mm new area of hypodensity in the right temporal lobe concerning for hemorrhage associated with metastasis. Pt was working with SLP on CIR to address cognitive goals. He has not been dysphagic and has been eating a regular diet per chart review.  Orders for cognitive-language evaluation written 7/2. Passed [employer] Screen.      SLP Plan  Continue with current plan of care       Recommendations  Diet recommendations: Dysphagia 3 (mechanical soft);Thin liquid                Oral Care Recommendations: Oral  care BID SLP Visit Diagnosis: Cognitive communication deficit (H21.975) Plan: Continue with current plan of care       GO              Shannon Smaldone L. Tivis Ringer, [place] CCC/SLP Acute Rehabilitation Services Office number [phone] Pager [phone]   Shannon Horton 05/04/2021, 9:24 AM

## 2021-05-04 NOTE — Plan of Care (Signed)
  Problem: Pain Managment: Goal: General experience of comfort will improve Outcome: Progressing   Problem: Safety: Goal: Ability to remain free from injury will improve Outcome: Progressing   

## 2021-05-04 NOTE — Progress Notes (Addendum)
HEMATOLOGY-ONCOLOGY PROGRESS NOTE  SUBJECTIVE: Metastatic malignant melanoma with involvement of brain, adrenal, lung nodules status post surgical excision on 03/31/2021.  Pathology came back as metastatic melanoma.  CT scan showed very large adrenal mass as well as lung nodules consistent with metastatic disease.  He has had fluctuating mental status with hemorrhage into the brain as well as signs and symptoms of encephalopathy.  He has been on IV steroids and Keppra for seizure prophylaxis. Oncology has been reconsulted to discuss treatment plan and prognosis.  OBJECTIVE: REVIEW OF SYSTEMS:   Constitutional: Patient is awake and alert and answers some questions appropriately but not to fall. He was able to eat his dinner by himself. He looks extremely frail having lost significant muscle mass. Full review of systems is not able to be done because of his condition.  PHYSICAL EXAMINATION: ECOG PERFORMANCE STATUS: 4 - Bedbound  Vitals:   05/04/21 1400 05/04/21 1622  BP: 124/89 117/72  Pulse:  66  Resp: 15 16  Temp:  97.6 F (36.4 C)  SpO2:  97%   Filed Weights   05/01/21 1747  Weight: 140 lb 1.6 oz (63.5 kg)    GENERAL:alert, frail NEURO: Able to answer some questions.  Able to move his extremities.  Able to eat his dinner by himself.  LABORATORY DATA:  I have reviewed the data as listed CMP Latest Ref Rng & Units 05/04/2021 05/03/2021 05/02/2021  Glucose 70 - 99 mg/dL 123(H) 159(H) 124(H)  BUN 8 - 23 mg/dL 16 20 21   Creatinine 0.61 - 1.24 mg/dL 0.56(L) 0.75 0.55(L)  Sodium 135 - 145 mmol/L 133(L) 135 132(L)  Potassium 3.5 - 5.1 mmol/L 3.6 4.0 5.3(H)  Chloride 98 - 111 mmol/L 98 99 100  CO2 22 - 32 mmol/L 27 26 21(L)  Calcium 8.9 - 10.3 mg/dL 9.3 9.2 9.6  Total Protein 6.5 - 8.1 g/dL - - -  Total Bilirubin 0.3 - 1.2 mg/dL - - -  Alkaline Phos 38 - 126 U/L - - -  AST 15 - 41 U/L - - -  ALT 0 - 44 U/L - - -    Lab Results  Component Value Date   WBC 9.2 05/04/2021   HGB 15.5  05/04/2021   HCT 44.0 05/04/2021   MCV 90.7 05/04/2021   PLT 191 05/04/2021   NEUTROABS 5.3 05/02/2021    ASSESSMENT AND PLAN: 1.  Metastatic malignant melanoma: To brain, adrenal and lung status postcraniotomy and excision on 03/31/2021 2. extremely poor performance status given his condition and age. 3.  We will reconsult radiation oncology 4.  I have requested Dr. Mickeal Skinner to once again see the patient 5.  At this point in time patient is not a good candidate for systemic immunotherapy given his performance status.  Palliative care is quite appropriate. He has an incurable disease but is very aggressive and therefore hospice consultation is also appropriate. Discussed with his wife and I left a message with Dr.Muirenson to call me at his earliest convenience.  (Patient's family friend and his previous oncologist)

## 2021-05-05 DIAGNOSIS — I615 Nontraumatic intracerebral hemorrhage, intraventricular: Secondary | ICD-10-CM

## 2021-05-05 LAB — CBC
HCT: 42.7 % (ref 39.0–52.0)
Hemoglobin: 14.8 g/dL (ref 13.0–17.0)
MCH: 31.2 pg (ref 26.0–34.0)
MCHC: 34.7 g/dL (ref 30.0–36.0)
MCV: 89.9 fL (ref 80.0–100.0)
Platelets: 193 10*3/uL (ref 150–400)
RBC: 4.75 MIL/uL (ref 4.22–5.81)
RDW: 12.4 % (ref 11.5–15.5)
WBC: 10.5 10*3/uL (ref 4.0–10.5)
nRBC: 0 % (ref 0.0–0.2)

## 2021-05-05 LAB — BASIC METABOLIC PANEL
Anion gap: 7 (ref 5–15)
BUN: 18 mg/dL (ref 8–23)
CO2: 27 mmol/L (ref 22–32)
Calcium: 9.2 mg/dL (ref 8.9–10.3)
Chloride: 101 mmol/L (ref 98–111)
Creatinine, Ser: 0.54 mg/dL — ABNORMAL LOW (ref 0.61–1.24)
GFR, Estimated: >60 mL/min (ref >60)
Glucose, Bld: 138 mg/dL — ABNORMAL HIGH (ref 70–99)
Potassium: 3.7 mmol/L (ref 3.5–5.1)
Sodium: 135 mmol/L (ref 135–145)

## 2021-05-05 MED ORDER — AMLODIPINE BESYLATE 10 MG PO TABS
10.0000 mg | ORAL_TABLET | Freq: Every day | ORAL | Status: DC
  Administered 2021-05-05 – 2021-05-07 (×3): 10 mg via ORAL
  Filled 2021-05-05 (×3): qty 1

## 2021-05-05 MED ORDER — DIVALPROEX SODIUM 250 MG PO DR TAB
750.0000 mg | DELAYED_RELEASE_TABLET | Freq: Two times a day (BID) | ORAL | Status: DC
  Administered 2021-05-05 – 2021-05-07 (×4): 750 mg via ORAL
  Filled 2021-05-05 (×4): qty 3

## 2021-05-05 MED ORDER — VALPROATE SODIUM 100 MG/ML IV SOLN
20.0000 mg/kg/d | Freq: Every day | INTRAVENOUS | Status: AC
  Administered 2021-05-05: 1270 mg via INTRAVENOUS
  Filled 2021-05-05: qty 12.7

## 2021-05-05 MED ORDER — LEVETIRACETAM 500 MG PO TABS
500.0000 mg | ORAL_TABLET | Freq: Two times a day (BID) | ORAL | Status: DC
  Administered 2021-05-05 – 2021-05-06 (×2): 500 mg via ORAL
  Filled 2021-05-05 (×2): qty 1

## 2021-05-05 NOTE — Progress Notes (Signed)
Discussion with Dr. Brent Bulla At the request of patient's wife I contacted Dr. Brent Bulla, retired medical oncologist and a family friend of the patient.  He described to me that Mr. 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 It is his opinion that there may be a secondary cause for his CNS Impairment and that we may need to consider performing a spinal tap if we are not anywhere near to identifying the cause of his sudden mental status change. He also discussed with Dr. Mickeal Skinner. I will defer to Dr. Mickeal Skinner and neurology for evaluation and treatment of his mental status issues. Dr. Lisbeth Renshaw or Dr. Isidore Moos will follow the patient to assess for radiation I discussed with her and Dr. Alen Blew who felt that in his current state he is not a candidate for systemic immunotherapy.

## 2021-05-05 NOTE — Progress Notes (Signed)
Palliative:  HPI: 75 y.o. 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   I met again today at [facility]' bedside along with wife. His wife is feeding him lunch and he appears to be enjoying his soup. He is just beginning to wake up today (seems he typicaly sleeps much of the morning and awakening more around lunch time). EEG continues - no evidence of seizures per Dr. Karolee Stamps note - shared with wife. She also had a chance to speak with Dr. Lindi Adie and she understands and agrees that he is not a candidate for systemic treatment. Family seemed focused on treating the treatable and optimizing his quality of life. Wife has expressed consideration of hospice as well. Will continue to discuss further and await for bed placement options. Encouraged wife to continue to focus on her own self care as well as caring for her husband.   All questions/concerns addressed. Emotional support provided.   Exam: Alert, minimally verbal and when verbal not necessarily appropriate responses to questions. He does nod his head yes/no. No distress. Thin, frail. Breathing regular, unlabored. Abd flat.  Plan: - Optimize medically. Treat the treatable.  - Pursuing placement in facility. Consider hospice at facility.   15 min  Vinie Sill, NP Palliative Medicine Team Pager [phone] (Please see [email or web address] for schedule) Team Phone [phone]    Greater than 50%  of this time was spent counseling and coordinating care related to the above assessment and plan

## 2021-05-05 NOTE — Progress Notes (Signed)
PROGRESS NOTE    [name]  MBW:[id number] DOB: 10-Jul-1946 DOA: 05/01/2021 PCP: Laurey Morale, MD     No chief complaint on file.   Brief Narrative:   Mister Krahenbuhl. is a 75 y.o. 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  Where he had an episode of loss of consciousness, Code stroke was called, CT head showed new intracranial bleeding of a 5 mm area of hypodensity in the right temporal lobe.  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 Now with AMS due to new hemorrhagic metastasis in the right temporal lobe - patient is fluctuating, likely due to underlying hemorrhagic mass and small left ventricular hemorrhage.  Neurology following, currently on IV steroids and Keppra for seizure prophylaxis.  Overnight EEG results pending.  Appears to be a candidate for palliative care and hospice.  Prognosis appears poor.   Metastatic melanoma to brain, adrenal gland and lungs, with brain metastasis status post craniotomy resection of right frontal tumor 03/31/2021. - He was seen by Dr. Lindi Adie last admission and this admission, also has seen Dr. Mickeal Skinner in the past.  Per oncology note from 05/04/2021 candidate for palliative care and hospice.  Palliative care team involved, await for final input from palliative care team and oncology.    HTN -Blood pressure slightly  elevated added Norvasc.  Monitor.Marland Kitchen   Hyponatremia -Hydrochlorothiazide has been stopped, within normal range today.     Asthma -Stable   Seizure -Continue Keppra   GI prophylaxis -Continue PPI  DVT prophylaxis: SCD, will hold on chemical DVT prophylaxis currently given metastatic lesions hemorrhage Code Status: DNR Family Communication: D/W wife  at bedside Disposition:   Status is: Inpatient  Remains inpatient appropriate because:Altered mental status, Ongoing diagnostic testing needed not appropriate for outpatient work up, and Unsafe d/c plan  Dispo: The patient is from:  [facility]              Anticipated d/c is to: SNF              Patient currently is not medically stable to d/c.   Difficult to place patient No    Consultants:  Neurology palliative Discussed with neurooncology Dr. Mickeal Skinner oncology   Subjective:  Change in bed is not in any distress but appears extremely weak and deconditioned, he denies any headache chest or abdominal pain.  Objective: Vitals:   05/05/21 0000 05/05/21 0400 05/05/21 0736 05/05/21 0837  BP: (!) 161/95 (!) 147/79 (!) 166/97   Pulse: 80  78 62  Resp: 18 14 15 15   Temp: 97.6 F (36.4 C) 97.8 F (36.6 C) 97.7 F (36.5 C)   TempSrc: Axillary Axillary Axillary   SpO2: 95% 95%  96%  Weight:      Height:        Intake/Output Summary (Last 24 hours) at 05/05/2021 0942 Last data filed at 05/05/2021 0011 Gross per 24 hour  Intake 118 ml  Output 700 ml  Net -582 ml  Filed Weights   05/01/21 1747  Weight: 63.5 kg    Examination:  Awake but appears extremely weak and lethargic, denies any headache chest or abdominal pain, [place].AT,PERRAL Supple Neck,No JVD, No cervical lymphadenopathy appriciated.  Symmetrical Chest wall movement, Good air movement bilaterally, CTAB RRR,No Gallops, Rubs or new Murmurs, No Parasternal Heave +ve B.Sounds, Abd Soft, No tenderness, No organomegaly appriciated, No rebound - guarding or rigidity. No  Cyanosis, Clubbing or edema, No new Rash or bruise   Data Reviewed: I have personally reviewed following labs and imaging studies  CBC: Recent Labs  Lab 05/02/21 0104 05/03/21 1027 05/04/21 0113 05/05/21 0130  WBC 7.3 9.8 9.2 10.5  NEUTROABS 5.3  --   --   --   HGB 16.4 14.9 15.5 14.8  HCT 50.1 43.2 44.0 42.7  MCV 97.5 92.1 90.7 89.9  PLT 186 209 191 546    Basic Metabolic Panel: Recent Labs  Lab 04/30/21 0500 05/02/21 0104 05/03/21 1027 05/04/21 0113 05/05/21 0130  NA 134* 132* 135 133* 135  K 4.1 5.3* 4.0 3.6 3.7  CL 100 100 99 98 101  CO2 25 21* 26 27 27   GLUCOSE 151* 124* 159* 123* 138*  BUN 27* 21 20 16 18   CREATININE 0.59* 0.55* 0.75 0.56* 0.54*  CALCIUM 9.8 9.6 9.2 9.3 9.2    GFR: Estimated Creatinine Clearance: 71.7 mL/min (A) (by C-G formula based on SCr of 0.54 mg/dL (L)).  Liver Function Tests: No results for input(s): AST, ALT, ALKPHOS, BILITOT, PROT, ALBUMIN in the last 168 hours.  CBG: Recent Labs  Lab 05/01/21 1451  GLUCAP 183*   No results found for this or any previous visit (from the past 240 hour(s)).   Radiology Studies: No results found.  Scheduled Meds:   stroke: mapping our early stages of recovery book   Does not apply Once   amLODipine  10 mg Oral Daily   brinzolamide  1 drop Both Eyes BID   budesonide  0.5 mg Nebulization BID   dexamethasone (DECADRON) injection  4 mg Intravenous Q6H   latanoprost  1 drop Both Eyes QHS   levETIRAcetam  500 mg Oral BID   methylphenidate  5 mg Oral BID WC   pantoprazole  40 mg Oral QHS   sodium chloride  2 g Oral BID WC   Continuous Infusions:   LOS: 4 days   Signature  Lala Lund M.D on 05/05/2021 at 9:42 AM   -  To page go to [email or web address]

## 2021-05-05 NOTE — Progress Notes (Signed)
EEG maintenance complete. No skin breakdown seen at electrode site FP1 FP2 F7. Continue to monitor

## 2021-05-05 NOTE — Procedures (Addendum)
Patient Name: Shannon Horton.  MRN: [id number]  Epilepsy Attending: Lora Havens  Referring Physician/Provider: Dr Kathrynn Speed Duration: 05/04/2021 1210 to 05/05/2021 1210   Patient history: 75yo M with acute unresponsiveness, right facial weakness, left hemiparesis. EEG to evaluate for seizure   Level of alertness:  awake, asleep   AEDs during EEG study: LEV   Technical aspects: This EEG study was done with scalp electrodes positioned according to the 10-20 International system of electrode placement. Electrical activity was acquired at a sampling rate of 500Hz  and reviewed with a high frequency filter of 70Hz  and a low frequency filter of 1Hz . EEG data were recorded continuously and digitally stored.   Description:The posterior dominant rhythm consists of 8-9 Hz activity of moderate voltage (25-35 uV) seen predominantly in posterior head regions, symmetric and reactive to eye opening and eye closing. Sleep was characterized by vertex waves, sleep spindles (12 to 14 Hz), maximal frontocentral region. EEG showed continuous right frontal 5 to 6 Hz theta as well as intermittent 2-3Hz  delta slowing. Lateralized periodic discharges were noted in right frontal region at 1 Hz without any evolution lasting about 5 to 10 seconds each time, predominantly when patient was awake/stimulated.    Event button was pressed on 05/05/2021 at 1118. Per team, patient was communicating and suddenly became less responsive, possibly staring off and tapping his nose with left hand/ hand automatism (unable to view on camera as position) on the patient). Concomitant EEG showed lateralized periodic discharges in the right frontal region at 1 Hz with evolution in space to involve left temporal region with no significant change in frequency or morphology.   Hyperventilation and photic stimulation were not performed.      ABNORMALITY - Continuous slow, right frontal region - Lateralized periodic discharges, right  frontal region   IMPRESSION: This study showed evidence of epileptogenicity and cortical dysfunction arising from right frontal region, likely secondary to underlying structural abnormality/mass. One event was recorded on 05/05/2021 at 1118 during which patient was less responsive and possibly had hand automatisms. Concomitant EEG showed lateralized periodic discharges in right frontal region at 1 Hz with evolution in the left temporal region.  Even though ictal changes were not definitively seen on EEG, given clinical correlate it is very likely that this was a focal seizure.  Terry Abila [name]

## 2021-05-05 NOTE — Plan of Care (Signed)
Discharged to acute due to change in patient condition.

## 2021-05-05 NOTE — Progress Notes (Signed)
Occupational Therapy Treatment Patient Details Name: Shannon Horton. MRN: [id number] DOB: 22-Jun-1946 Today's Date: 05/05/2021    History of present illness 75yo male admitted 05/01/21 after becoming unresponsive at [facility]. Transferred to acute as a code CVA and found to have new intracranial bleed R temporal lobe on CT. PMH HTN, malignant melanoma, craniotmy, HTN   OT comments  Pt making slow progress towards acute OT goals. Session limited by lethargy. Pt on continuous EEG during session. Pt initially with eyes closed most of the time. Minimal, one word verbalizations for most of session. Delayed responses and difficulty with one-step command following. Pt slowly became more alert during session. Max A +2 to roll to each side with pt able to reach and hold onto side bed rail to facilitate bed pad change in side-lying position. Pt with right gaze preference throughout session; able to visually attend to left side, with verbal cueing,  but does not maintain for long. Of note, at end of session pt appeared much more alert, spoke in short sentences; pleasant demeanor. Updated d/c recommendation to SNF.     Follow Up Recommendations  SNF    Equipment Recommendations  Other (comment) (defer to next venue)    Recommendations for Other Services      Precautions / Restrictions Precautions Precautions: Fall Precaution Comments: R crani, new R temporal bleed Restrictions Weight Bearing Restrictions: No Other Position/Activity Restrictions: Pt with significant left lateral lean noted       Mobility Bed Mobility Overal bed mobility: Needs Assistance   Rolling: Max assist;+2 for safety/equipment;+2 for physical assistance         General bed mobility comments: decreased inititation and difficulty sequencing(?). Lethargic but eyes opened during bed mobility. Able to reach out and hold side bed rails, max A to power into full sidelying.    Transfers                 General transfer  comment: unable to safely assess 2/2 lethargy and impaired cognition    Balance       Sitting balance - Comments: unable to safely assess today 2/2 lethargy                                   ADL either performed or assessed with clinical judgement   ADL Overall ADL's : Needs assistance/impaired     Grooming: Wash/dry hands;Wash/dry face;Total assistance;Maximal assistance;Bed level Grooming Details (indicate cue type and reason): able to open and close each hand for therapist to clean hand with washcloth. Unable to bring both hands to midline today. Limited by lethargy and impaired cogntion. Total A to wash face                               General ADL Comments: Hand and face washing as detailed above. Pt rolled to each side with max +2 A, was able to reach out and hold onto bed rails in sidelying position.     Vision   Vision Assessment?: Vision impaired- to be further tested in functional context Additional Comments: right gaze preference. Able to visually attend to left side with verbal cues but does not maintain for very long today.   Perception     Praxis      Cognition Arousal/Alertness: Lethargic Behavior During Therapy: Flat affect Overall Cognitive Status: Impaired/Different from baseline Area of Impairment:  Orientation;Attention;Memory;Following commands;Safety/judgement;Awareness;Problem solving                 Orientation Level: Time;Situation ("[place]") Current Attention Level: Focused Memory: Decreased short-term memory Following Commands: Follows one step commands inconsistently;Follows one step commands with increased time Safety/Judgement: Decreased awareness of safety;Decreased awareness of deficits Awareness: Intellectual Problem Solving: Decreased initiation;Difficulty sequencing;Requires verbal cues;Slow processing General Comments: much more lethargic than at PT eval- maybe able to follow cues about 50% of the  time and had SIGNIFICANT processing time today. Able to participate in UE/LE movements and rolling. Stated "I feel like today is going to better", then asked what nurses computer was at [facility]        Exercises     Shoulder Instructions       General Comments      Pertinent Vitals/ Pain       Pain Assessment: Faces Faces Pain Scale: No hurt Pain Location: moaning during roll to ride side Pain Descriptors / Indicators: Moaning Pain Intervention(s): Limited activity within patient's tolerance;Monitored during session  Home Living                                          Prior Functioning/Environment              Frequency  Min 2X/week        Progress Toward Goals  OT Goals(current goals can now be found in the care plan section)  Progress towards OT goals: Progressing toward goals (limited progress)  Acute Rehab OT Goals Patient Stated Goal: did not state OT Goal Formulation: Patient unable to participate in goal setting Time For Goal Achievement: 05/31/21 Potential to Achieve Goals: Fair ADL Goals Pt Will Perform Grooming: with min assist;sitting Pt Will Perform Upper Body Bathing: with min assist;sitting Pt Will Perform Upper Body Dressing: with min assist;sitting Pt Will Transfer to Toilet: with min assist;stand pivot transfer;bedside commode Pt Will Perform Toileting - Clothing Manipulation and hygiene: with min assist;sitting/lateral leans;sit to/from stand Additional ADL Goal #1: Pt will follow 1-2 step commands with minimal multimodal cues in a minimally distracting environment with 100% accuracy in 4/5 trials.  Plan Discharge plan needs to be updated    Co-evaluation    PT/OT/SLP Co-Evaluation/Treatment: Yes Reason for Co-Treatment: Complexity of the patient's impairments (multi-system involvement);For patient/therapist safety   OT goals addressed during session: ADL's and self-care      AM-PAC OT "6 Clicks" Daily Activity      Outcome Measure   Help from another person eating meals?: A Lot Help from another person taking care of personal grooming?: A Lot Help from another person toileting, which includes using toliet, bedpan, or urinal?: Total Help from another person bathing (including washing, rinsing, drying)?: A Lot Help from another person to put on and taking off regular upper body clothing?: A Lot Help from another person to put on and taking off regular lower body clothing?: Total 6 Click Score: 10    End of Session    OT Visit Diagnosis: Unsteadiness on feet (R26.81);Muscle weakness (generalized) (M62.81);Repeated falls (R29.6);Other symptoms and signs involving cognitive function;Other symptoms and signs involving the nervous system (R29.898)   Activity Tolerance Patient limited by lethargy   Patient Left in bed;with call bell/phone within reach;with bed alarm set   Nurse Communication          Time: 9983-3825 OT Time Calculation (min): 31 min  Charges: OT General Charges $OT Visit: 1 Visit OT Treatments $Self Care/Home Management : 8-22 mins  Tyrone Schimke, OT Acute Rehabilitation Services Pager: [phone] Office: [phone]    Hortencia Pilar 05/05/2021, 10:53 AM

## 2021-05-05 NOTE — Progress Notes (Signed)
Physical Therapy Treatment Patient Details Name: Shannon Horton. MRN: [id number] DOB: 1945-12-13 Today's Date: 05/05/2021    History of Present Illness 75yo male admitted 05/01/21 after becoming unresponsive at [facility]. Transferred to acute as a code CVA and found to have new intracranial bleed R temporal lobe on CT. PMH HTN, malignant melanoma, craniotmy, HTN    PT Comments    Patient received in bed, on EEG monitor and quite lethargic today. Followed cues maybe 50% of the time at most with significant processing time and poor attention noted. Able to move BUEs/LEs with cues and participated in rolling with MaxA. Attempted recall activities using family photos but had limited participation. Did seem more alert at [facility]. Left in bed with all needs met, bed alarm active and in frame of EEG sensor. Will continue efforts.    Follow Up Recommendations  SNF;Supervision/Assistance - 24 hour (vs hospice)     Equipment Recommendations  Hospital bed;Wheelchair (measurements PT);Wheelchair cushion (measurements PT);Other (comment);3in1 (PT) (TIS WC, hoyer lift and pads)    Recommendations for Other Services       Precautions / Restrictions Precautions Precautions: Fall Precaution Comments: R crani, new R temporal bleed Restrictions Weight Bearing Restrictions: No Other Position/Activity Restrictions: Pt with significant left lateral lean noted    Mobility  Bed Mobility Overal bed mobility: Needs Assistance Bed Mobility: Rolling Rolling: Max assist         General bed mobility comments: needed MaxA for rolling side to side for linen change- but able to initiate reaching for and grasping railing with cues    Transfers                 General transfer comment: deferred- fatigue  Ambulation/Gait             General Gait Details: deferred-  fatigue   Stairs             Wheelchair Mobility    Modified Rankin (Stroke Patients Only)       Balance        Sitting balance - Comments: unable to safely assess today 2/2 lethargy                       High Level Balance Comments: deferred EOB level activities today- fatigue            Cognition Arousal/Alertness: Lethargic Behavior During Therapy: Flat affect Overall Cognitive Status: Impaired/Different from baseline Area of Impairment: Orientation;Attention;Memory;Following commands;Safety/judgement;Awareness;Problem solving                 Orientation Level: Time;Situation ("[place]") Current Attention Level: Focused Memory: Decreased short-term memory Following Commands: Follows one step commands inconsistently;Follows one step commands with increased time Safety/Judgement: Decreased awareness of safety;Decreased awareness of deficits Awareness: Intellectual Problem Solving: Decreased initiation;Difficulty sequencing;Requires verbal cues;Slow processing General Comments: much more lethargic than at PT eval- maybe able to follow cues about 50% of the time and had SIGNIFICANT processing time today. Able to participate in UE/LE movements and rolling. Stated "I feel like today is going to better", then asked what nurses computer was at [place] Comments        Pertinent Vitals/Pain Pain Assessment: Faces Faces Pain Scale: No hurt Pain Location: moaning during roll to ride side Pain Descriptors / Indicators: Moaning Pain Intervention(s): Limited activity within patient's tolerance;Monitored during session    Home Living  Prior Function            PT Goals (current goals can now be found in the care plan section) Acute Rehab PT Goals Patient Stated Goal: did not state PT Goal Formulation: Patient unable to participate in goal setting Time For Goal Achievement: 05/17/21 Potential to Achieve Goals: Fair Progress towards PT goals: Not progressing toward goals - comment (limited by lethargy)     Frequency    Min 3X/week      PT Plan Discharge plan needs to be updated    Co-evaluation   Reason for Co-Treatment: Complexity of the patient's impairments (multi-system involvement);For patient/therapist safety   OT goals addressed during session: ADL's and self-care      AM-PAC PT "6 Clicks" Mobility   Outcome Measure  Help needed turning from your back to your side while in a flat bed without using bedrails?: A Lot Help needed moving from lying on your back to sitting on the side of a flat bed without using bedrails?: Total Help needed moving to and from a bed to a chair (including a wheelchair)?: Total Help needed standing up from a chair using your arms (e.g., wheelchair or bedside chair)?: Total Help needed to walk in hospital room?: Total Help needed climbing 3-5 steps with a railing? : Total 6 Click Score: 7    End of Session   Activity Tolerance: Patient limited by fatigue;Patient limited by lethargy Patient left: in bed;with call bell/phone within reach;with bed alarm set Nurse Communication: Mobility status PT Visit Diagnosis: Other abnormalities of gait and mobility (R26.89);Difficulty in walking, not elsewhere classified (R26.2)     Time: 1001-1031 PT Time Calculation (min) (ACUTE ONLY): 30 min  Charges:  $Therapeutic Activity: 8-22 mins (co-tx with OT)                    Windell Norfolk, DPT, PN1   Supplemental Physical Therapist [place]    Pager [phone] Acute Rehab Office [phone]

## 2021-05-05 NOTE — Progress Notes (Signed)
Neurology Progress Note  Brief HPI: 75 y.o. male with a PMHx of HTN, tobacco use, and malignant melanoma initially removed from the right calf and left flank in 2011 with metastasis to the brain, right adrenal gland, and lung found in May of 2022. He was evaluated in May of 2022 for persistent headaches, balance disturbance, and left hemiparesis when an MRI brain showed enhancing and hemorrhagic mass in the right basal ganglia/frontal lobe region with a 5 mm leftward midline shift s/p R frontal craniotomy for tumor resection on 03/31/2021 and was in CIR. 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   Neurology with repeat evaluations due to concern for mental status changes with acute unresponsiveness and persistent encephalopathy. Patient considered poor candidate for systemic immunotherapy given his ongoing encephalopathy and performance.   Subjective: No acute overnight events, he remains on cEEG monitoring  Exam: Vitals:   05/05/21 0736 05/05/21 0837  BP: (!) 166/97   Pulse: 78 62  Resp: 15 15  Temp: 97.7 F (36.5 C)   SpO2:  96%   Gen: Frail appearing man, laying in bed, sleeping, in no acute distress HEENT: Well approximated scar to right frontal scalp, open to air, without drainage. Normocephalic. Resp: non-labored breathing, no respiratory distress, on room air Abd: soft, non-tender, non-distended  Neuro: Mental Status: Lethargic, opens eyes briefly to voice and touch. He quickly closes eyes and falls back to sleep requiring constant stimulation for minimal participation.  He does not speak above a whisper and does not answer orientation questions beyond telling examiner his first name. He intermittently follows simple commands. Poor attention noted. No neglect noted.  Cranial Nerves: PERRL, will briefly fixate and track examiner in lateral visual fields, no facial  asymmetry noted at rest, hearing is intact to voice, head appears midline, does not protrude tongue to command.  Motor: Generalized weakness and deconditioning noted.  Grip strength 4/5 bilaterally.  Bilateral upper extremities 4/5 strength with some antigravity movement noted. Drift to bed 2/2 weakness and loss of concentration.  Bilateral lower extremities with 2/5 strength with minimal movement noted without attempt at antigravity movement.  He will give "thumbs up", show two fingers, and wiggle toes bilaterally and intermittently to command.  Sensory: Intact and symmetrical to light touch.  Gait: Deferred  Pertinent Labs: CBC    Component Value Date/Time   WBC 10.5 05/05/2021 0130   RBC 4.75 05/05/2021 0130   HGB 14.8 05/05/2021 0130   HCT 42.7 05/05/2021 0130   PLT 193 05/05/2021 0130   MCV 89.9 05/05/2021 0130   MCH 31.2 05/05/2021 0130   MCHC 34.7 05/05/2021 0130   RDW 12.4 05/05/2021 0130   LYMPHSABS 1.3 05/02/2021 0104   MONOABS 0.6 05/02/2021 0104   EOSABS 0.0 05/02/2021 0104   BASOSABS 0.1 05/02/2021 0104   CMP     Component Value Date/Time   NA 135 05/05/2021 0130   K 3.7 05/05/2021 0130   CL 101 05/05/2021 0130   CO2 27 05/05/2021 0130   GLUCOSE 138 (H) 05/05/2021 0130   BUN 18 05/05/2021 0130   CREATININE 0.54 (L) 05/05/2021 0130   CREATININE 0.82 09/10/2020 1045   CALCIUM 9.2 05/05/2021 0130   PROT 5.6 (L) 04/22/2021 0433   ALBUMIN 3.3 (L) 04/22/2021 0433   AST 18 04/22/2021 0433   ALT 33 04/22/2021 0433   ALKPHOS 66 04/22/2021 0433   BILITOT  1.1 04/22/2021 0433   GFRNONAA >60 05/05/2021 0130   GFRNONAA 87 09/10/2020 1045   GFRAA 101 09/10/2020 1045   Imaging Reviewed: No new Imaging  CTH 05/01/21:  New 5 mm area of hyperdensity in the right temporal lobe. There is punctate corresponding enhancement on the prior MRI and this may reflect hemorrhage associated with a metastasis. Small volume residual hemorrhage within the occipital horns. Increased  thin subdural hygroma along the right falx. Evolving postoperative changes and adjacent subacute infarction.   MRI brain 05/01/21 Slightly larger 4 mm metastatic lesion of the anterior left insula. Decreased size of enhancing focus at the right caudate head. 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 Decreased contrast enhancement at the right frontal resection site, likely resolving postoperative change.  EEG overnight 05/04/2021 - 05/05/2021: "This study showed evidence of epileptogenicity and cortical dysfunction arising from left frontal region, likely secondary to underlying structural abnormality/mass. No seizures were seen throughout the recording."  Assessment: Unfortunate 75 yo male with metastatic brain lesions s/p unresponsive episode with finding of new hemorrhage associated with a met. In reading of notes/exams, his encephalopathy has improved. Due to his brain metastases, it will likely be difficulty for him to return to a totally normal mental status. Continuing AED long term is reasonable given his tumor/mets/hemorrhages put him at risk for seizures; favoring transition from [place] to Depakote due to known deliriogenic effects of Keppra. Added Depakote 05/05/2021.  Impression:   -metastatic melanoma to the brain, lung, and adrenal gland. -s/p code stroke with new hemorrhagic met found. -concern for seizure 2/2 brain metastases -s/p right frontal craniotomy for tumor resection 03/31/21 -concern for delirium with fluctuating degrees of alertness and waxing/waning mental status  Recommendations: -Initiated 57m/kg IV Depakote load with maintenance dosing of 750 mg BID -Continue inpatient seizure precautions. -Continue to hold antiplatelet and/or blood thinning medications. -Goal BP < 160 -Continue Decadron 451mIV q6 hours. -Neurology will be available if further questions.   StAnibal HendersonAGACNP-BC [facility] [phone] have  seen the patient reviewed the above note.  He has periodic discharges in the right frontal region, these do not evolve in frequency, but wax and wane in amplitude and do have some variance in field.  While I was in the room he appeared to have a behavioral arrest, and this corresponded to an increase in the field of his periodic discharges on the EEG making me concerned that this may represent seizure.  The fluctuations in his mental status could be due to this, but it could also be that he is simply confused and stop talking because of that.  At this time, I would favor more aggressive antiepileptic therapy and therefore we will start Depakote.  If he has not improved by tomorrow, may need to consider LP to assess for  leptomeningeal carcinomatosis.   McRoland RackMD [facility] [phone] 7pm- 7am, please page neurology on call as listed in [place]

## 2021-05-05 NOTE — TOC Progression Note (Signed)
Transition of Care (TOC) - Progression Note    Patient Details  Name: Shannon Horton. MRN: [id number] Date of Birth: [birth year]  [facility]) CM/SW [place], LCSW Phone Number: 05/05/2021, 5:44 PM  Clinical Narrative:    CSW left voicemail for patient's wife. [facility] has a waitlist for long term care beds so they do not think patient would be a good fit in case rehab turns into long term care. CSW only has two other bed offers, [facility] and [facility]. [facility] is still reviewing patient but needs a firm plan on if patient will require hospice.    Expected Discharge Plan: Skilled Nursing Facility Barriers to Discharge: Ship broker, SNF Pending bed offer, Continued Medical Work up  Expected Discharge Plan and Services Expected Discharge Plan: [place] In-house Referral: Clinical Social Work   Post Acute Care Choice: [place] Living arrangements for the past 2 months: Single Family Home                                       Social Determinants of Health (SDOH) Interventions    Readmission Risk Interventions Readmission Risk Prevention Plan 04/06/2021  Post Dischage Appt Complete  Medication Screening Complete  Transportation Screening Complete  Some recent data might be hidden

## 2021-05-06 ENCOUNTER — Ambulatory Visit: Payer: Medicare PPO | Admitting: Radiation Oncology

## 2021-05-06 LAB — CBC
HCT: 43.9 % (ref 39.0–52.0)
Hemoglobin: 15.2 g/dL (ref 13.0–17.0)
MCH: 31.5 pg (ref 26.0–34.0)
MCHC: 34.6 g/dL (ref 30.0–36.0)
MCV: 90.9 fL (ref 80.0–100.0)
Platelets: 197 10*3/uL (ref 150–400)
RBC: 4.83 MIL/uL (ref 4.22–5.81)
RDW: 12.2 % (ref 11.5–15.5)
WBC: 11.8 10*3/uL — ABNORMAL HIGH (ref 4.0–10.5)
nRBC: 0 % (ref 0.0–0.2)

## 2021-05-06 LAB — BASIC METABOLIC PANEL
Anion gap: 12 (ref 5–15)
BUN: 20 mg/dL (ref 8–23)
CO2: 21 mmol/L — ABNORMAL LOW (ref 22–32)
Calcium: 9.1 mg/dL (ref 8.9–10.3)
Chloride: 101 mmol/L (ref 98–111)
Creatinine, Ser: 0.66 mg/dL (ref 0.61–1.24)
GFR, Estimated: >60 mL/min (ref >60)
Glucose, Bld: 124 mg/dL — ABNORMAL HIGH (ref 70–99)
Potassium: 3.6 mmol/L (ref 3.5–5.1)
Sodium: 134 mmol/L — ABNORMAL LOW (ref 135–145)

## 2021-05-06 LAB — VALPROIC ACID LEVEL: Valproic Acid Lvl: 56 ug/mL (ref 50.0–100.0)

## 2021-05-06 MED ORDER — VALPROATE SODIUM 100 MG/ML IV SOLN
500.0000 mg | Freq: Once | INTRAVENOUS | Status: AC
  Administered 2021-05-06: 500 mg via INTRAVENOUS
  Filled 2021-05-06: qty 5

## 2021-05-06 NOTE — Progress Notes (Signed)
PROGRESS NOTE    [name]  BSW:[id number] DOB: 09-Nov-1945 DOA: 05/01/2021 PCP: Laurey Morale, MD     No chief complaint on file.   Brief Narrative:   Kaci Freel. is a 75 y.o. 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  Where he had an episode of loss of consciousness, Code stroke was called, CT head showed new intracranial bleeding of a 5 mm area of hypodensity in the right temporal lobe.  So patient was admitted for further work-up    Subjective:  Patient in bed appears weak and lethargic, unable to answer questions or follow commands reliably today.  Appears to be in no apparent distress.   Assessment & Plan:    Acute metabolic encephalopathy secondary to recurrent metastatic brain lesions with hemorrhage -Patient with underlining encephalopathic, with fluctuating mental status due to known metastatic melanoma to the brain, status post right frontal craniotomy, hemorrhagic mass in the right basal ganglia/frontal lobe region, and small left ventricular hemorrhage due to choroid metastatic lesions. Now with AMS due to new hemorrhagic metastasis in the right temporal lobe - patient is fluctuating, likely due to underlying hemorrhagic mass and small left ventricular hemorrhage.  Neurology following, currently on steroids, Keppra being switched to Depakote on 05/06/2021 by neurology.  Overnight EEG results noted with no acute seizures but positive epileptiform changes.  Case discussed with oncology, neurooncology and neurology, for now monitor on steroids and Depakote.   Metastatic melanoma to brain, adrenal gland and lungs, with brain metastasis status post craniotomy resection of right frontal tumor 03/31/2021. - He was seen by Dr. Lindi Adie last admission and  this admission along with neuro oncologist Dr. Mickeal Skinner, family for now wants to withhold on LP, case discussed with the treatment team on 05/06/2021, for now continue trial of IV steroids and now Depakote which will be started on 05/06/2021 and monitor, long-term prognosis appears poor.    HTN - Blood pressure slightly elevated added Norvasc.  Monitor.Marland Kitchen   Hyponatremia -Hydrochlorothiazide has been stopped, within normal range today.    Asthma -Stable   Seizure -Continue Depakote   GI prophylaxis -Continue PPI    DVT prophylaxis: SCD, will hold on chemical DVT prophylaxis currently given metastatic lesions hemorrhage Code Status: DNR Family Communication: D/W son Quita Skye [phone] on 05/06/2021 Disposition:   Status is: Inpatient  Remains inpatient appropriate because:Altered mental status, Ongoing diagnostic testing needed not appropriate for outpatient work up, and Unsafe d/c plan  Dispo: The patient is from:  CIR              Anticipated d/c is to: SNF              Patient currently is not medically stable to d/c.   Difficult to place patient No    Consultants:  Neurology palliative Discussed with neurooncology Dr. Mickeal Skinner oncology     Objective: Vitals:   05/06/21 0023 05/06/21 0809 05/06/21 0826 05/06/21 1159  BP: 120/82 118/78  (!) 145/87  Pulse: 75 70 76 77  Resp: 16 15 14 15   Temp: 98.2 F (36.8 C) 97.6 F (36.4 C)  97.9 F (36.6 C)  TempSrc: Axillary Axillary  Oral  SpO2: 99%     Weight:      Height:        Intake/Output Summary (Last  24 hours) at 05/06/2021 1229 Last data filed at 05/06/2021 4196 Gross per 24 hour  Intake 120 ml  Output 1450 ml  Net -1330 ml   Filed Weights   05/01/21 1747  Weight: 63.5 kg    Examination:  Awake but minimally responsive unable to answer questions or follow commands reliably, appears extremely frail [place].AT,PERRAL Supple Neck,No JVD, No cervical lymphadenopathy appriciated.  Symmetrical Chest wall movement, Good air  movement bilaterally, CTAB RRR,No Gallops, Rubs or new Murmurs, No Parasternal Heave +ve B.Sounds, Abd Soft, No tenderness, No organomegaly appriciated, No rebound - guarding or rigidity. No Cyanosis, Clubbing or edema, No new Rash or bruise    Data Reviewed: I have personally reviewed following labs and imaging studies  CBC: Recent Labs  Lab 05/02/21 0104 05/03/21 1027 05/04/21 0113 05/05/21 0130 05/06/21 0044  WBC 7.3 9.8 9.2 10.5 11.8*  NEUTROABS 5.3  --   --   --   --   HGB 16.4 14.9 15.5 14.8 15.2  HCT 50.1 43.2 44.0 42.7 43.9  MCV 97.5 92.1 90.7 89.9 90.9  PLT 186 209 191 193 222    Basic Metabolic Panel: Recent Labs  Lab 05/02/21 0104 05/03/21 1027 05/04/21 0113 05/05/21 0130 05/06/21 0044  NA 132* 135 133* 135 134*  K 5.3* 4.0 3.6 3.7 3.6  CL 100 99 98 101 101  CO2 21* 26 27 27  21*  GLUCOSE 124* 159* 123* 138* 124*  BUN 21 20 16 18 20   CREATININE 0.55* 0.75 0.56* 0.54* 0.66  CALCIUM 9.6 9.2 9.3 9.2 9.1    GFR: Estimated Creatinine Clearance: 71.7 mL/min (by C-G formula based on SCr of 0.66 mg/dL).  Liver Function Tests: No results for input(s): AST, ALT, ALKPHOS, BILITOT, PROT, ALBUMIN in the last 168 hours.  CBG: Recent Labs  Lab 05/01/21 1451  GLUCAP 183*   No results found for this or any previous visit (from the past 240 hour(s)).   Radiology Studies: Overnight EEG with video  Result Date: 05/05/2021 Lora Havens, MD     05/06/2021  9:41 AM Patient Name: [name] MRN: [id number] Epilepsy Attending: Lora Havens Referring Physician/Provider: Dr Kathrynn Speed Duration: 05/04/2021 1210 to 05/05/2021 1210  Patient history: 75yo M with acute unresponsiveness, right facial weakness, left hemiparesis. EEG to evaluate for seizure  Level of alertness:  awake, asleep  AEDs during EEG study: LEV  Technical aspects: This EEG study was done with scalp electrodes positioned according to the 10-20 International system of electrode placement.  Electrical activity was acquired at a sampling rate of 500Hz  and reviewed with a high frequency filter of 70Hz  and a low frequency filter of 1Hz . EEG data were recorded continuously and digitally stored.  Description:The posterior dominant rhythm consists of 8-9 Hz activity of moderate voltage (25-35 uV) seen predominantly in posterior head regions, symmetric and reactive to eye opening and eye closing. Sleep was characterized by vertex waves, sleep spindles (12 to 14 Hz), maximal frontocentral region. EEG showed continuous right frontal 5 to 6 Hz theta as well as intermittent 2-3Hz  delta slowing. Lateralized periodic discharges were noted in right frontal region at 1 Hz without any evolution lasting about 5 to 10 seconds each time, predominantly when patient was awake/stimulated.  Event button was pressed on 05/05/2021 at 1118. Per team, patient was communicating and suddenly became less responsive, possibly staring off and tapping his nose with left hand/ hand automatism (unable to view on camera as position) on the patient). Concomitant EEG  showed lateralized periodic discharges in the right frontal region at 1 Hz with evolution in space to involve left temporal region with no significant change in frequency or morphology. Hyperventilation and photic stimulation were not performed.    ABNORMALITY - Continuous slow, right frontal region - Lateralized periodic discharges, right frontal region  IMPRESSION: This study showed evidence of epileptogenicity and cortical dysfunction arising from right frontal region, likely secondary to underlying structural abnormality/mass. One event was recorded on 05/05/2021 at 1118 during which patient was less responsive and possibly had hand automatisms. Concomitant EEG showed lateralized periodic discharges in right frontal region at 1 Hz with evolution in the left temporal region.  Even though ictal changes were not definitively seen on EEG, given clinical correlate it is very likely  that this was a focal seizure. Priyanka [name]    Scheduled Meds:   stroke: mapping our early stages of recovery book   Does not apply Once   amLODipine  10 mg Oral Daily   brinzolamide  1 drop Both Eyes BID   budesonide  0.5 mg Nebulization BID   dexamethasone (DECADRON) injection  4 mg Intravenous Q6H   divalproex  750 mg Oral Q12H   latanoprost  1 drop Both Eyes QHS   levETIRAcetam  500 mg Oral BID   methylphenidate  5 mg Oral BID WC   pantoprazole  40 mg Oral QHS   Continuous Infusions:   LOS: 5 days   Signature  Lala Lund M.D on 05/06/2021 at 12:29 PM   -  To page go to [email or web address]

## 2021-05-06 NOTE — Procedures (Addendum)
Patient Name: Shannon Horton.  MRN: [id number]  Epilepsy Attending: Lora Havens  Referring Physician/Provider: Dr Kathrynn Speed Duration: 05/05/2021 1210 to 05/06/2021 1210   Patient history: 75yo M with acute unresponsiveness, right facial weakness, left hemiparesis. EEG to evaluate for seizure   Level of alertness:  awake, asleep   AEDs during EEG study:  VPA   Technical aspects: This EEG study was done with scalp electrodes positioned according to the 10-20 International system of electrode placement. Electrical activity was acquired at a sampling rate of 500Hz  and reviewed with a high frequency filter of 70Hz  and a low frequency filter of 1Hz . EEG data were recorded continuously and digitally stored.   Description:The posterior dominant rhythm consists of 8-9 Hz activity of moderate voltage (25-35 uV) seen predominantly in posterior head regions, symmetric and reactive to eye opening and eye closing. Sleep was characterized by vertex waves, sleep spindles (12 to 14 Hz), maximal frontocentral region. EEG showed continuous right frontal 5 to 6 Hz theta as well as intermittent 2-3Hz  delta slowing. Lateralized periodic discharges were noted in right frontal region at 1 Hz without any evolution lasting about 5 to 10 seconds each time, predominantly when patient was awake/stimulated.  Hyperventilation and photic stimulation were not performed.      ABNORMALITY - Continuous slow, right frontal region - Lateralized periodic discharges, right frontal region   IMPRESSION: This study showed evidence of epileptogenicity and cortical dysfunction arising from right frontal region, likely secondary to underlying structural abnormality/mass.  No definite seizures were seen during the study.  Shannon Horton

## 2021-05-06 NOTE — Progress Notes (Signed)
EEG maintenance performed.  No skin breakdown observed at electrode positions Fp1, Fp2.

## 2021-05-06 NOTE — TOC Progression Note (Signed)
Transition of Care (TOC) - Progression Note    Patient Details  Name: Shannon Horton. MRN: [id number] Date of Birth: 1946/10/27  Transition of Care [facility]) CM/SW [place], LCSW Phone Number: 05/06/2021, 9:31 AM  Clinical Narrative:    CSW received return call from patient's spouse. She reported understanding of the bed offers and is requesting [facility]. CSW went over the facilities that have not responded yet but she stated they were too far from her. CSW sent request for bed to [facility].    Expected Discharge Plan: Skilled Nursing Facility Barriers to Discharge: Ship broker, SNF Pending bed offer, Continued Medical Work up  Expected Discharge Plan and Services Expected Discharge Plan: [place] In-house Referral: Clinical Social Work   Post Acute Care Choice: [place] Living arrangements for the past 2 months: Single Family Home                                       Social Determinants of Health (SDOH) Interventions    Readmission Risk Interventions Readmission Risk Prevention Plan 04/06/2021  Post Dischage Appt Complete  Medication Screening Complete  Transportation Screening Complete  Some recent data might be hidden

## 2021-05-06 NOTE — Progress Notes (Signed)
Neurology Progress Note  Brief HPI: 75 y.o. male with a PMHx of HTN, tobacco use, and malignant melanoma initially removed from the right calf and left flank in 2011 with metastasis to the brain, right adrenal gland, and lung found in May of 2022. He was evaluated in May of 2022 for persistent headaches, balance disturbance, and left hemiparesis when an MRI brain showed enhancing and hemorrhagic mass in the right basal ganglia/frontal lobe region with a 5 mm leftward midline shift s/p R frontal craniotomy for tumor resection on 03/31/2021 and was in CIR. 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    Neurology with repeat evaluations due to concern for mental status changes with acute unresponsiveness and persistent encephalopathy. Patient considered poor candidate for systemic immunotherapy given his ongoing encephalopathy and performance.   Subjective: No acute overnight events Patient more alert this morning than yesterday morning  Exam: Vitals:   05/06/21 0809 05/06/21 0826  BP: 118/78   Pulse: 70 76  Resp: 15 14  Temp: 97.6 F (36.4 C)   SpO2:     Gen: In bed, NAD Resp: non-labored breathing, no acute distress Abd: soft, nt  Neuro: Mental Status: Awake, alert, oriented to self, age, place, and year.  When asked the month he states "2023". He briefly spoke with his son via video call at bedside, he is able to identify his son's name. Speech is hypophonic without aphasia. He is able to appropriately answer "yes/no" questions. Follows simple commands.  No neglect noted.  Cranial Nerves: PERRL, EOMI, fixates and tracks examiner, facial sensation intact and symmetric to light touch, face is symmetric resting and with movement, hearing is intact to voice, patient is hypophonic.  Motor: Moves all extremities spontaneously and will move each extremity against gravity on  command.  No asymmetry noted. Tone is normal, bulk is decreased Sensory: Intact and symmetrical to light touch in bilateral upper and lower extremities Gait: Deferred  Pertinent Labs: CBC    Component Value Date/Time   WBC 11.8 (H) 05/06/2021 0044   RBC 4.83 05/06/2021 0044   HGB 15.2 05/06/2021 0044   HCT 43.9 05/06/2021 0044   PLT 197 05/06/2021 0044   MCV 90.9 05/06/2021 0044   MCH 31.5 05/06/2021 0044   MCHC 34.6 05/06/2021 0044   RDW 12.2 05/06/2021 0044   LYMPHSABS 1.3 05/02/2021 0104   MONOABS 0.6 05/02/2021 0104   EOSABS 0.0 05/02/2021 0104   BASOSABS 0.1 05/02/2021 0104   CMP     Component Value Date/Time   NA 134 (L) 05/06/2021 0044   K 3.6 05/06/2021 0044   CL 101 05/06/2021 0044   CO2 21 (L) 05/06/2021 0044   GLUCOSE 124 (H) 05/06/2021 0044   BUN 20 05/06/2021 0044   CREATININE 0.66 05/06/2021 0044   CREATININE 0.82 09/10/2020 1045   CALCIUM 9.1 05/06/2021 0044   PROT 5.6 (L) 04/22/2021 0433   ALBUMIN 3.3 (L) 04/22/2021 0433   AST 18 04/22/2021 0433   ALT 33 04/22/2021 0433   ALKPHOS 66 04/22/2021 0433   BILITOT 1.1 04/22/2021 0433   GFRNONAA >60 05/06/2021 0044   GFRNONAA 87 09/10/2020 1045   GFRAA 101 09/10/2020 1045   Imaging Reviewed:  [place] 05/01/21:  New 5 mm area of hyperdensity in the right temporal lobe. There is punctate corresponding enhancement on the prior MRI and this may reflect hemorrhage associated with a  metastasis. Small volume residual hemorrhage within the occipital horns. Increased thin subdural hygroma along the right falx. Evolving postoperative changes and adjacent subacute infarction.   MRI brain 05/01/21 Slightly larger 4 mm metastatic lesion of the anterior left insula. Decreased size of enhancing focus at the right caudate head. 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 Decreased contrast enhancement at the right frontal resection site, likely resolving postoperative change.   EEG overnight  05/04/2021 - 05/05/2021: "This study showed evidence of epileptogenicity and cortical dysfunction arising from left frontal region, likely secondary to underlying structural abnormality/mass. No seizures were seen throughout the recording."  EEG overnight 05/05/2021 - 05/06/2021 pending  Assessment: Unfortunate 75 yo male with metastatic brain lesions s/p unresponsive episode with finding of new hemorrhage associated with a met. In reading of notes/exams, his encephalopathy has improved. Due to his brain metastases, it will likely be difficulty for him to return to a totally normal mental status. Continuing AED long term is reasonable given his tumor/mets/hemorrhages put him at risk for seizures. On evaluation 05/05/2021 patient had a witnessed behavioral arrest with corresponding increase in EEG periodic discharges concerning for seizure activity. Keppra was continued with addition of Depakote in favor of more aggressive antiepileptic therapy.   Impression:  -metastatic melanoma to the brain, lung, and adrenal gland. -s/p code stroke with new hemorrhagic met found. -concern for seizure 2/2 brain metastases -s/p right frontal craniotomy for tumor resection 03/31/21 -concern for delirium with fluctuating degrees of alertness and waxing/waning mental status  Recommendations: - Continue Depakote - Continue inpatient seizure precautions -Continue to hold antiplatelet and/or blood thinning medications. -Goal BP < 160 -Continue Decadron 26m IV q6 hours  SAnibal Henderson AGACNP-BC [facility] [phone] I have seen the patient and reviewed the above note.  He continues to have persistent periodic discharges that abate during sleep.  His sons report that he was doing much better few days ago, and I am worried that he may actually be getting sedated by the [place]  Again, the EEG pattern coupled with the episode of transient behavioral arrest that I witnessed yesterday is concerning for seizures,  but I am not certain that more aggressively chasing this pattern will improve his exam.  At this point, I think that discontinuing the Keppra to see if his lethargy improves would be a higher yield intervention.  I discussed lumbar puncture with the family, but they are leaning against it at this time.  MRoland Rack MD [facility] [phone] If 7pm- 7am, please page neurology on call as listed in [place]

## 2021-05-06 NOTE — Progress Notes (Signed)
Palliative:  HPI: 74 y.o. 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   I met today along with Jordan Hawks, NP with son Marik and later joined by son Quita Skye. We discussed plans for continued medication to prevent seizures and no definitive seizures seen on EEG per Dr. Hortense Ramal and also no recommendations for treatment of his cancer. Jhoel expressed concern for dehydration and why his father is not receiving IV fluids and we discussed that the medical team looks at many different things to determine benefits vs risks of all interventions including IV fluids. Although I did take this opportunity to discuss the goals moving forward and out of the hospital on how to manage if he is not eating/drinking well and becomes dehydrated if they would desire rehospitalization for IV fluids because this is very likely to happen. We discussed that we can provide interventions to prolong life but nothing to fix or reverse or make Mr. Rodenbaugh's quality of life better so we have to weigh the benefits vs the risks of these interventions. Adam does speak up and say that they do not want to ping pong back to the hospital for things like IV fluids and would want comfort. We discussed that this would be best managed and provided by hospice at the nursing facility. Mrs. Fogel also has agreed with hospice assistance during conversation over the past days.   All questions/concerns addressed to best of my ability. Emotional support provided.   Exam: Alert, minimally verbal and when verbal not necessarily appropriate responses to questions. He does nod his head yes/no. No distress. Thin, frail. Breathing regular, unlabored. Abd flat.  Plan:  - Optimize medically.  - SNF placement needed adding assistance from  hospice.   31 min  Vinie Sill, NP Palliative Medicine Team Pager [phone] (Please see [email or web address] for schedule) Team Phone [phone]    Greater than 50%  of this time was spent counseling and coordinating care related to the above assessment and plan

## 2021-05-07 ENCOUNTER — Encounter (HOSPITAL_COMMUNITY): Payer: Self-pay | Admitting: Internal Medicine

## 2021-05-07 ENCOUNTER — Ambulatory Visit: Payer: Medicare PPO | Admitting: Radiation Oncology

## 2021-05-07 DIAGNOSIS — R410 Disorientation, unspecified: Secondary | ICD-10-CM

## 2021-05-07 LAB — CBC
HCT: 44.4 % (ref 39.0–52.0)
Hemoglobin: 15.9 g/dL (ref 13.0–17.0)
MCH: 32.3 pg (ref 26.0–34.0)
MCHC: 35.8 g/dL (ref 30.0–36.0)
MCV: 90.2 fL (ref 80.0–100.0)
Platelets: 198 10*3/uL (ref 150–400)
RBC: 4.92 MIL/uL (ref 4.22–5.81)
RDW: 12.3 % (ref 11.5–15.5)
WBC: 11.1 10*3/uL — ABNORMAL HIGH (ref 4.0–10.5)
nRBC: 0 % (ref 0.0–0.2)

## 2021-05-07 LAB — BASIC METABOLIC PANEL
Anion gap: 13 (ref 5–15)
BUN: 31 mg/dL — ABNORMAL HIGH (ref 8–23)
CO2: 24 mmol/L (ref 22–32)
Calcium: 9.3 mg/dL (ref 8.9–10.3)
Chloride: 96 mmol/L — ABNORMAL LOW (ref 98–111)
Creatinine, Ser: 0.74 mg/dL (ref 0.61–1.24)
GFR, Estimated: >60 mL/min (ref >60)
Glucose, Bld: 107 mg/dL — ABNORMAL HIGH (ref 70–99)
Potassium: 3.5 mmol/L (ref 3.5–5.1)
Sodium: 133 mmol/L — ABNORMAL LOW (ref 135–145)

## 2021-05-07 LAB — VALPROIC ACID LEVEL: Valproic Acid Lvl: 112 ug/mL — ABNORMAL HIGH (ref 50.0–100.0)

## 2021-05-07 MED ORDER — BACLOFEN 10 MG PO TABS: 5.0000 mg | ORAL_TABLET | Freq: Two times a day (BID) | ORAL | Status: DC | PRN

## 2021-05-07 MED ORDER — MORPHINE SULFATE (PF) 2 MG/ML IV SOLN: 1.0000 mg | INTRAVENOUS | Status: DC | PRN

## 2021-05-07 MED ORDER — BIOTENE DRY MOUTH MT LIQD: 15.0000 mL | OROMUCOSAL | Status: DC | PRN

## 2021-05-07 MED ORDER — POLYVINYL ALCOHOL 1.4 % OP SOLN
1.0000 [drp] | Freq: Four times a day (QID) | OPHTHALMIC | Status: DC | PRN
  Filled 2021-05-07: qty 15

## 2021-05-07 MED ORDER — LORAZEPAM 2 MG/ML PO CONC: 1.0000 mg | ORAL | Status: DC | PRN

## 2021-05-07 MED ORDER — LORAZEPAM 1 MG PO TABS: 1.0000 mg | ORAL_TABLET | ORAL | Status: DC | PRN

## 2021-05-07 MED ORDER — LORAZEPAM 2 MG/ML IJ SOLN: 1.0000 mg | INTRAMUSCULAR | Status: DC | PRN

## 2021-05-07 NOTE — Procedures (Addendum)
Patient Name: Shannon Horton.  MRN: [id number]  Epilepsy Attending: Lora Havens  Referring Physician/Provider: Dr Kathrynn Speed Duration: 05/06/2021 1210 to 05/07/2021 1210   Patient history: 75yo M with acute unresponsiveness, right facial weakness, left hemiparesis. EEG to evaluate for seizure   Level of alertness:  awake, asleep   AEDs during EEG study:  VPA   Technical aspects: This EEG study was done with scalp electrodes positioned according to the 10-20 International system of electrode placement. Electrical activity was acquired at a sampling rate of 500Hz  and reviewed with a high frequency filter of 70Hz  and a low frequency filter of 1Hz . EEG data were recorded continuously and digitally stored.   Description:The posterior dominant rhythm consists of 8-9 Hz activity of moderate voltage (25-35 uV) seen predominantly in posterior head regions, symmetric and reactive to eye opening and eye closing. Sleep was characterized by vertex waves, sleep spindles (12 to 14 Hz), maximal frontocentral region. EEG showed continuous right frontal 5 to 6 Hz theta as well as intermittent 2-3Hz  delta slowing. Lateralized periodic discharges were noted in right frontal region at 1 Hz without any evolution, predominantly when patient was awake/stimulated.  Hyperventilation and photic stimulation were not performed.      ABNORMALITY - Continuous slow, right frontal region - Lateralized periodic discharges, right frontal region   IMPRESSION: This study showed evidence of epileptogenicity and cortical dysfunction arising from right frontal region, likely secondary to underlying structural abnormality/mass.  No definite seizures were seen during the study.  EEG appears similar to previous day.   Hailey Stormer [name]

## 2021-05-07 NOTE — TOC Progression Note (Signed)
Transition of Care (TOC) - Progression Note    Patient Details  Name: Shannon Horton. MRN: [id number] Date of Birth: 08-25-1946  Transition of Care [facility] CM/SW [place], LCSW Phone Number: 05/07/2021, 2:36 PM  Clinical Narrative:    CSW spoke with patient's spouse and son at bedside regarding plan. After speaking with the MD, they are requesting patient to discharge with hospice either at [facility] or [facility] as family cannot care for him at home. CSW contacted [facility] and they can potentially accept patient private pay with hospice depending on bed availability. CSW contacted family's preference of [facility] to come evaluate for [facility] versus hospice at [facility]. CSW updated patient's spouse.    Expected Discharge Plan: Skilled Nursing Facility Barriers to Discharge: Ship broker, SNF Pending bed offer, Continued Medical Work up  Expected Discharge Plan and Services Expected Discharge Plan: [place] In-house Referral: Clinical Social Work   Post Acute Care Choice: [place] Living arrangements for the past 2 months: Single Family Home                                       Social Determinants of Health (SDOH) Interventions    Readmission Risk Interventions Readmission Risk Prevention Plan 04/06/2021  Post Dischage Appt Complete  Medication Screening Complete  Transportation Screening Complete  Some recent data might be hidden

## 2021-05-07 NOTE — Progress Notes (Signed)
PROGRESS NOTE    [name]  ZHY:[id number] DOB: 17-Feb-1946 DOA: 05/01/2021 PCP: Laurey Morale, MD     No chief complaint on file.   Brief Narrative:   Shannon Horton. is a 75 y.o. 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  Where he had an episode of loss of consciousness, Code stroke was called, CT head showed new intracranial bleeding of a 5 mm area of hypodensity in the right temporal lobe.  So patient was admitted for further work-up    Subjective:  Patient in bed appears weak and lethargic, unable to answer questions or follow commands reliably today.  Appears to be in no apparent distress.   Assessment & Plan:   Detailed bedside DW Social work, son, wife and patient, they are very clear - full comfort measures >> Hospice now.  Goal of care now full comfort, stop telemetry and other monitoring, stop known comfort medications.  Look for residential hospice versus nursing home with palliative care/hospice.   Other medical problems addressed this admission prior to full comfort measures are below.       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 Now with AMS due to new hemorrhagic metastasis in the right temporal lobe - patient is fluctuating, likely due to underlying hemorrhagic mass and small left ventricular hemorrhage.  Neurology following, currently on steroids, Keppra being switched to Depakote on  05/06/2021 by neurology.  Overnight EEG results noted with no acute seizures but positive epileptiform changes.  Case discussed with oncology, neurooncology and neurology, for now monitor on steroids and Depakote.   Metastatic melanoma to brain, adrenal gland and lungs, with brain metastasis status post craniotomy resection of right frontal tumor 03/31/2021. - He was seen by Dr. Lindi Adie last admission and this admission along with neuro oncologist Dr. Mickeal Skinner, family for now wants to withhold on LP, case discussed with the treatment team on 05/06/2021, for now continue trial of IV steroids and now Depakote which will be started on 05/06/2021 and monitor, long-term prognosis appears poor.    HTN - Blood pressure slightly elevated added Norvasc.  Monitor.Marland Kitchen   Hyponatremia -Hydrochlorothiazide has been stopped, within normal range today.    Asthma -Stable   Seizure -Continue Depakote   GI prophylaxis -Continue PPI    DVT prophylaxis:  SCD, will hold on chemical DVT prophylaxis currently given metastatic lesions hemorrhage Code Status:  DNR Family Communication: D/W son Quita Skye [phone] on 05/06/2021, 05/07/21 Disposition:   Status is: Inpatient  Remains inpatient appropriate because:Altered mental status, Ongoing diagnostic testing needed not appropriate for outpatient work up, and Unsafe d/c plan  Dispo: The patient is from:  [facility]              Anticipated d/c is to: SNF              Patient currently is not medically stable to d/c.   Difficult to place patient No    Consultants:  Neurology palliative Discussed with neurooncology Dr. Mickeal Skinner oncology  Objective:  Vitals:   05/07/21 0421 05/07/21 4010  05/07/21 0838 05/07/21 1141  BP: 116/81 109/71  98/65  Pulse: 75 75  76  Resp: 15 11  15   Temp: (!) 97.4 F (36.3 C) 97.8 F (36.6 C)  (!) 97.4 F (36.3 C)  TempSrc: Oral Axillary  Axillary  SpO2: 94%  95%   Weight:      Height:        Intake/Output Summary (Last 24 hours) at  05/07/2021 1231 Last data filed at 05/07/2021 1115 Gross per 24 hour  Intake --  Output 925 ml  Net -925 ml   Filed Weights   05/01/21 1747  Weight: 63.5 kg    Examination:  Awake but minimally responsive unable to answer questions or follow commands reliably, appears extremely frail [place].AT,PERRAL Supple Neck,No JVD, No cervical lymphadenopathy appriciated.  Symmetrical Chest wall movement, Good air movement bilaterally, CTAB RRR,No Gallops, Rubs or new Murmurs, No Parasternal Heave +ve B.Sounds, Abd Soft, No tenderness, No organomegaly appriciated, No rebound - guarding or rigidity. No Cyanosis, Clubbing or edema, No new Rash or bruise     Data Reviewed: I have personally reviewed following labs and imaging studies  CBC: Recent Labs  Lab 05/02/21 0104 05/03/21 1027 05/04/21 0113 05/05/21 0130 05/06/21 0044 05/07/21 0153  WBC 7.3 9.8 9.2 10.5 11.8* 11.1*  NEUTROABS 5.3  --   --   --   --   --   HGB 16.4 14.9 15.5 14.8 15.2 15.9  HCT 50.1 43.2 44.0 42.7 43.9 44.4  MCV 97.5 92.1 90.7 89.9 90.9 90.2  PLT 186 209 191 193 197 335    Basic Metabolic Panel: Recent Labs  Lab 05/03/21 1027 05/04/21 0113 05/05/21 0130 05/06/21 0044 05/07/21 0153  NA 135 133* 135 134* 133*  K 4.0 3.6 3.7 3.6 3.5  CL 99 98 101 101 96*  CO2 26 27 27  21* 24  GLUCOSE 159* 123* 138* 124* 107*  BUN 20 16 18 20  31*  CREATININE 0.75 0.56* 0.54* 0.66 0.74  CALCIUM 9.2 9.3 9.2 9.1 9.3    GFR: Estimated Creatinine Clearance: 71.7 mL/min (by C-G formula based on SCr of 0.74 mg/dL).  Liver Function Tests: No results for input(s): AST, ALT, ALKPHOS, BILITOT, PROT, ALBUMIN in the last 168 hours.  CBG: Recent Labs  Lab 05/01/21 1451  GLUCAP 183*   No results found for this or any previous visit (from the past 240 hour(s)).   Radiology Studies: No results found.  Scheduled Meds:   stroke: mapping our early stages of recovery book   Does not apply Once   amLODipine  10 mg Oral Daily    brinzolamide  1 drop Both Eyes BID   budesonide  0.5 mg Nebulization BID   dexamethasone (DECADRON) injection  4 mg Intravenous Q6H   divalproex  750 mg Oral Q12H   latanoprost  1 drop Both Eyes QHS   methylphenidate  5 mg Oral BID WC   pantoprazole  40 mg Oral QHS   Continuous Infusions:   LOS: 6 days   Signature  Lala Lund M.D on 05/07/2021 at 12:31 PM   -  To page go to [email or web address]

## 2021-05-07 NOTE — Progress Notes (Signed)
PT Cancellation Note  Patient Details Name: Shannon Horton. MRN: [id number] DOB: 06-08-1946   Cancelled Treatment:    Reason Eval/Treat Not Completed: Other (comment) patient appears to continue to decline medically, now going to residential hospice at [facility]. Discussed case with attending MD (Dr. Candiss Norse), now signing off. Thank you for the opportunity to participate in his care!   Windell Norfolk, DPT, PN1   Supplemental Physical Therapist [facility]    Pager [phone] Acute Rehab Office [phone]

## 2021-05-07 NOTE — Progress Notes (Signed)
Family reports he is more sleepy.  On exam, he does awaken, is not oriented, has taking longer to answer questions that he has on previous days.   He is more sleepy today, which I suspect is due to his elevated Depakote level of 112 this morning.  He had a conversation with Dr. Candiss Norse earlier, and the family has decided to transition to comfort care, which I think is a reasonable approach.  I discussed with the family that I would favor continuing at least one antiepileptic, but I would hold the Depakote for today to see if he is more awake tomorrow.  The discharge that he has been having have been consistently absent during sleep, arguing against an ictal etiology.  I suspect overall that this represents hypoactive delirium in the setting of multiple physiological stressors, brain metastasis, steroids.  Once his level is no longer supratherapeutic, I will start back at 500 mg twice daily.  Roland Rack, MD [facility] [phone]  If 7pm- 7am, please page neurology on call as listed in [place].

## 2021-05-07 NOTE — Progress Notes (Signed)
Palliative Medicine RN Note: Rec'd a call from pt's son Quita Skye [phone]). Family would like to meet again with our team this afternoon. We have very limited provider availability today, but I did page all PMT providers at [facility] to see if anyone is available.  Marjie Skiff Keily Lepp, RN, BSN, [facility] Palliative Medicine Team 05/07/2021 11:45 AM Office [phone]

## 2021-05-08 LAB — VALPROIC ACID LEVEL: Valproic Acid Lvl: 74 ug/mL (ref 50.0–100.0)

## 2021-05-08 MED ORDER — DIVALPROEX SODIUM 250 MG PO DR TAB
500.0000 mg | DELAYED_RELEASE_TABLET | Freq: Two times a day (BID) | ORAL | Status: DC
  Administered 2021-05-08 – 2021-05-09 (×2): 500 mg via ORAL
  Filled 2021-05-08 (×2): qty 2

## 2021-05-08 NOTE — Progress Notes (Signed)
PROGRESS NOTE    [name]  WCB:[id number] DOB: 1946/08/28 DOA: 05/01/2021 PCP: Shannon Morale, MD     No chief complaint on file.   Brief Narrative:   Shannon Horton. is a 75 y.o. 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  Where he had an episode of loss of consciousness, Code stroke was called, CT head showed new intracranial bleeding of a 5 mm area of hypodensity in the right temporal lobe.  So patient was admitted for further work-up    Subjective:  Patient in bed sleeping and appears to be in no distress   Assessment & Plan:   Detailed bedside DW Social work, son, wife and patient, they are very clear - full comfort measures >> Hospice now.  Goal of care now full comfort, stop telemetry and other monitoring, stop all non comfort medications.  Look for residential hospice versus nursing home with palliative care/hospice.    Other medical problems addressed this admission prior to full comfort measures are below.    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 Now with AMS due to new hemorrhagic metastasis in the right temporal lobe - patient is fluctuating, likely due to underlying hemorrhagic mass and small left ventricular hemorrhage.  Neurology following, currently on steroids, Keppra being switched to Depakote on 05/06/2021 by neurology.  Overnight EEG results noted with no acute seizures but positive  epileptiform changes.  Case discussed with oncology, neurooncology and neurology, for now monitor on steroids and Depakote.   Metastatic melanoma to brain, adrenal gland and lungs, with brain metastasis status post craniotomy resection of right frontal tumor 03/31/2021. - He was seen by Dr. Lindi Horton last admission and this admission along with neuro oncologist Dr. Mickeal Horton, family for now wants to withhold on LP, case discussed with the treatment team on 05/06/2021, for now continue trial of IV steroids and now Depakote which will be started on 05/06/2021 and monitor, long-term prognosis appears poor.    HTN - Blood pressure slightly elevated added Norvasc.  Monitor.Marland Kitchen   Hyponatremia -Hydrochlorothiazide has been stopped, within normal range today.    Asthma -Stable   Seizure -Continue Depakote   GI prophylaxis -Continue PPI    DVT prophylaxis:  SCD, will hold on chemical DVT prophylaxis currently given metastatic lesions hemorrhage Code Status:  DNR Family Communication: D/W son Shannon Horton [phone] on 05/06/2021, 05/07/21 Disposition:   Status is: Inpatient  Remains inpatient appropriate because:Altered mental status, Ongoing diagnostic testing needed not appropriate for outpatient work up, and Unsafe d/c plan  Dispo: The patient is from:  [facility]              Anticipated d/c is to: SNF              Patient currently is not medically stable to d/c.   Difficult to place patient No    Consultants:  Neurology palliative Discussed with neurooncology Dr. Mickeal Horton oncology  Objective:  Vitals:   05/07/21 0838 05/07/21 1141 05/08/21 0417 05/08/21 0826  BP:  98/65 (!) 125/101   Pulse:  76 89   Resp:  15 15   Temp:  (!) 97.4 F (36.3 C) 98.3 F (36.8 C)   TempSrc:  Axillary Oral   SpO2: 95%  96% 97%  Weight:      Height:        Intake/Output Summary (Last 24 hours) at 05/08/2021 0956 Last data filed at 05/07/2021 1115 Gross per 24 hour  Intake --  Output 125 ml  Net -125 ml   Filed Weights    05/01/21 1747  Weight: 63.5 kg    Examination:  Patient in bed resting appears to be in no distress, overall appears quite lethargic and weak. Abdomen is soft nontender CTA B    Data Reviewed: I have personally reviewed following labs and imaging studies  CBC: Recent Labs  Lab 05/02/21 0104 05/03/21 1027 05/04/21 0113 05/05/21 0130 05/06/21 0044 05/07/21 0153  WBC 7.3 9.8 9.2 10.5 11.8* 11.1*  NEUTROABS 5.3  --   --   --   --   --   HGB 16.4 14.9 15.5 14.8 15.2 15.9  HCT 50.1 43.2 44.0 42.7 43.9 44.4  MCV 97.5 92.1 90.7 89.9 90.9 90.2  PLT 186 209 191 193 197 301    Basic Metabolic Panel: Recent Labs  Lab 05/03/21 1027 05/04/21 0113 05/05/21 0130 05/06/21 0044 05/07/21 0153  NA 135 133* 135 134* 133*  K 4.0 3.6 3.7 3.6 3.5  CL 99 98 101 101 96*  CO2 26 27 27  21* 24  GLUCOSE 159* 123* 138* 124* 107*  BUN 20 16 18 20  31*  CREATININE 0.75 0.56* 0.54* 0.66 0.74  CALCIUM 9.2 9.3 9.2 9.1 9.3    GFR: Estimated Creatinine Clearance: 71.7 mL/min (by C-G formula based on SCr of 0.74 mg/dL).  Liver Function Tests: No results for input(s): AST, ALT, ALKPHOS, BILITOT, PROT, ALBUMIN in the last 168 hours.  CBG: Recent Labs  Lab 05/01/21 1451  GLUCAP 183*   No results found for this or any previous visit (from the past 240 hour(s)).   Radiology Studies: No results found.  Scheduled Meds:   stroke: mapping our early stages of recovery book   Does not apply Once   brinzolamide  1 drop Both Eyes BID   budesonide  0.5 mg Nebulization BID   dexamethasone (DECADRON) injection  4 mg Intravenous Q6H   divalproex  500 mg Oral Q12H   latanoprost  1 drop Both Eyes QHS   methylphenidate  5 mg Oral BID WC   Continuous Infusions:   LOS: 7 days   Signature  Shannon Horton M.D on 05/08/2021 at 9:56 AM   -  To page go to [email or web address]

## 2021-05-08 NOTE — Progress Notes (Signed)
He was slightly more awake this morning when I saw him (note is delayed) but nursing states that he is really fairly similar to previous days overall from what they have seen.  His Depakote is therapeutic, therefore I would favor restarting it as I do think staying on at least one antiepileptic is prudent even though he is changing to comfort care.  I have restarted it at 500 mg twice daily.  Neurology will be available as needed, please call with further questions or concerns.  Roland Rack, MD [facility] [phone]  If 7pm- 7am, please page neurology on call as listed in [place].

## 2021-05-08 NOTE — Procedures (Signed)
Patient Name: Shannon Horton.  MRN: [id number]  Epilepsy Attending: Lora Havens  Referring Physician/Provider: Dr Kathrynn Speed Duration: 05/07/2021 1210 to 05/07/2021 1709   Patient history: 75yo M with acute unresponsiveness, right facial weakness, left hemiparesis. EEG to evaluate for seizure   Level of alertness:  awake, asleep   AEDs during EEG study:  VPA   Technical aspects: This EEG study was done with scalp electrodes positioned according to the 10-20 International system of electrode placement. Electrical activity was acquired at a sampling rate of 500Hz  and reviewed with a high frequency filter of 70Hz  and a low frequency filter of 1Hz . EEG data were recorded continuously and digitally stored.   Description:The posterior dominant rhythm consists of 8-9 Hz activity of moderate voltage (25-35 uV) seen predominantly in posterior head regions, symmetric and reactive to eye opening and eye closing. Sleep was characterized by vertex waves, sleep spindles (12 to 14 Hz), maximal frontocentral region. EEG showed continuous right frontal 5 to 6 Hz theta as well as intermittent 2-3Hz  delta slowing. Lateralized periodic discharges were noted in right frontal region at 1 Hz without any evolution, predominantly when patient was awake/stimulated.  Hyperventilation and photic stimulation were not performed.      ABNORMALITY - Continuous slow, right frontal region - Lateralized periodic discharges, right frontal region   IMPRESSION: This study showed evidence of epileptogenicity and cortical dysfunction arising from right frontal region, likely secondary to underlying structural abnormality/mass. No definite seizures were seen during the study.   Shannon Horton [name]

## 2021-05-08 NOTE — Progress Notes (Signed)
Manufacturing engineer [facility]) Hospital Liaison note.    Received request on Friday from [place] for family interest in [facility]. Chart and pt information have been reviewed by [facility] physician.  Hospice eligibility confirmed.  [facility] is unable to offer a room today. Hospital Liaison will follow up tomorrow or sooner if a room becomes available. Please do not hesitate to call with questions.    Thank you for the opportunity to participate in this patient's care.  Domenic Moras, BSN, RN [facility] Liaison (listed on [facility] under Hospice/[facility])    [phone] (24h on call)

## 2021-05-09 MED ORDER — DEXAMETHASONE 2 MG PO TABS: 2.0000 mg | ORAL_TABLET | Freq: Two times a day (BID) | ORAL | Status: AC

## 2021-05-09 MED ORDER — DIVALPROEX SODIUM 500 MG PO DR TAB: 500.0000 mg | DELAYED_RELEASE_TABLET | Freq: Two times a day (BID) | ORAL | Status: AC

## 2021-05-09 MED ORDER — MORPHINE SULFATE (CONCENTRATE) 10 MG/0.5ML PO SOLN: 10.0000 mg | ORAL | 0 refills | Status: AC | PRN

## 2021-05-09 MED ORDER — LORAZEPAM 2 MG/ML PO CONC: 2.0000 mg | ORAL | 0 refills | Status: AC | PRN

## 2021-05-09 NOTE — TOC Transition Note (Signed)
Transition of Care [facility]) - CM/SW Discharge Note   Patient Details  Name: Shannon Horton. MRN: [id number] Date of Birth: 1946/08/05  Transition of Care [facility]) CM/SW Contact:  Gabrielle Dare Phone Number: 05/09/2021, 3:29 PM   Clinical Narrative:    Patient will Discharge To: [facility] Anticipated DC Date:05/09/21 Family Notified:yes, son Jeriah Corkum, [phone] Transport LO:VFIE   Per MD patient ready for DC to  [facility]. RN, patient, patient's family, and facility notified of DC. Assessment, Fl2/Pasrr, and Discharge Summary sent to facility. RN given number for report [phone]). DC packet on chart. Ambulance transport requested for patient.   CSW signing off.  Reed Breech LCSWA [phone]     Final next level of care: [facility] Barriers to Discharge: No Barriers Identified   Patient Goals and CMS Choice Patient states their goals for this hospitalization and ongoing recovery are:: Rehab CMS Medicare.gov Compare Post Acute Care list provided to:: Patient Represenative (must comment) (spouse, son) Choice offered to / list presented to : Patient, Spouse, Adult Children  Discharge Placement              Patient chooses bed at:  [facility]) Patient to be transferred to facility by: [facility] Name of family member notified: Con Memos Patient and family notified of of transfer: 05/09/21  Discharge Plan and Services In-house Referral: Clinical Social Work   Post Acute Care Choice: [facility]                               Social Determinants of Health (SDOH) Interventions     Readmission Risk Interventions Readmission Risk Prevention Plan 04/06/2021  Post Dischage Appt Complete  Medication Screening Complete  Transportation Screening Complete  Some recent data might be hidden

## 2021-05-09 NOTE — Discharge Instructions (Signed)
Disposition.  Residential hospice °Condition.  Guarded °CODE STATUS.  DNR °Activity.  With assistance as tolerated, full fall precautions. °Diet.  Soft with feeding assistance and aspiration precautions. °Goal of care.  Comfort. ° °

## 2021-05-09 NOTE — Progress Notes (Signed)
Manufacturing engineer [facility]) Hospital Liaison note.      This patient is approved to transfer to [facility] today.     Paperwork has been completed. Please  arrange transport.     RN please call report to [phone].    Thank you,       Farrel Gordon, RN, [facility] Liaison    [phone]

## 2021-05-09 NOTE — Progress Notes (Signed)
PROGRESS NOTE    [name]  DXA:[id number] DOB: 08/22/1946 DOA: 05/01/2021 PCP: Laurey Morale, MD     No chief complaint on file.   Brief Narrative:   Shannon Horton. is a 75 y.o. 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  Where he had an episode of loss of consciousness, Code stroke was called, CT head showed new intracranial bleeding of a 5 mm area of hypodensity in the right temporal lobe.  So patient was admitted for further work-up    Subjective:  Patient in bed sleeping and appears to be in no distress   Assessment & Plan:   Detailed bedside DW Social work, son, wife and patient, they are very clear - full comfort measures >> Hospice now.  Goal of care now full comfort, stop telemetry and other monitoring, stop all non comfort medications.  Look for residential hospice versus nursing home with palliative care/hospice.    Other medical problems addressed this admission prior to full comfort measures are below.    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 Now with AMS due to new hemorrhagic metastasis in the right temporal lobe - patient is fluctuating, likely due to underlying hemorrhagic mass and small left ventricular hemorrhage.  Neurology following, currently on steroids, Keppra being switched to Depakote on 05/06/2021 by neurology.  Overnight EEG results noted with no acute seizures but positive  epileptiform changes.  Case discussed with oncology, neurooncology and neurology, for now monitor on steroids and Depakote.   Metastatic melanoma to brain, adrenal gland and lungs, with brain metastasis status post craniotomy resection of right frontal tumor 03/31/2021. - He was seen by Dr. Lindi Adie last admission and this admission along with neuro oncologist Dr. Mickeal Skinner, family for now wants to withhold on LP, case discussed with the treatment team on 05/06/2021, for now continue trial of IV steroids and now Depakote which will be started on 05/06/2021 and monitor, long-term prognosis appears poor.    HTN - Blood pressure slightly elevated added Norvasc.  Monitor.Marland Kitchen   Hyponatremia -Hydrochlorothiazide has been stopped, within normal range today.    Asthma -Stable   Seizure -Continue Depakote   GI prophylaxis -Continue PPI    DVT prophylaxis:  SCD, will hold on chemical DVT prophylaxis currently given metastatic lesions hemorrhage Code Status:  DNR Family Communication: D/W son Quita Skye & wife bedside [phone] on 05/06/2021, 05/07/21,  Disposition:   Status is: Inpatient  Remains inpatient appropriate because:Altered mental status, Ongoing diagnostic testing needed not appropriate for outpatient work up, and Unsafe d/c plan  Dispo: The [facility] is from:  [facility]              Anticipated d/c is to: SNF              Patient currently is not medically stable to d/c.   Difficult to place patient No    Consultants:  Neurology palliative Discussed with neurooncology Dr. Mickeal Skinner oncology  Objective:  Vitals:   05/08/21 1651 05/08/21 2000 05/08/21 2034 05/09/21 0821  BP: 135/79  128/84  Pulse: 83 80 87   Resp: 16 18 17    Temp: 97.6 F (36.4 C)  98.9 F (37.2 C)   TempSrc: Axillary  Axillary   SpO2: 97% 99% 98% 97%  Weight:      Height:        Intake/Output Summary (Last 24 hours) at 05/09/2021 1004 Last data filed at 05/08/2021 2145 Gross per 24 hour  Intake 0 ml  Output 1000 ml  Net  -1000 ml   Filed Weights   05/01/21 1747  Weight: 63.5 kg    Examination:  Patient in bed resting appears to be in no distress, overall appears quite lethargic and weak. Abdomen is soft nontender CTA B    Data Reviewed: I have personally reviewed following labs and imaging studies  CBC: Recent Labs  Lab 05/03/21 1027 05/04/21 0113 05/05/21 0130 05/06/21 0044 05/07/21 0153  WBC 9.8 9.2 10.5 11.8* 11.1*  HGB 14.9 15.5 14.8 15.2 15.9  HCT 43.2 44.0 42.7 43.9 44.4  MCV 92.1 90.7 89.9 90.9 90.2  PLT 209 191 193 197 546    Basic Metabolic Panel: Recent Labs  Lab 05/03/21 1027 05/04/21 0113 05/05/21 0130 05/06/21 0044 05/07/21 0153  NA 135 133* 135 134* 133*  K 4.0 3.6 3.7 3.6 3.5  CL 99 98 101 101 96*  CO2 26 27 27  21* 24  GLUCOSE 159* 123* 138* 124* 107*  BUN 20 16 18 20  31*  CREATININE 0.75 0.56* 0.54* 0.66 0.74  CALCIUM 9.2 9.3 9.2 9.1 9.3    GFR: Estimated Creatinine Clearance: 71.7 mL/min (by C-G formula based on SCr of 0.74 mg/dL).  Liver Function Tests: No results for input(s): AST, ALT, ALKPHOS, BILITOT, PROT, ALBUMIN in the last 168 hours.  CBG: No results for input(s): GLUCAP in the last 168 hours.  No results found for this or any previous visit (from the past 240 hour(s)).   Radiology Studies: No results found.  Scheduled Meds:   stroke: mapping our early stages of recovery book   Does not apply Once   brinzolamide  1 drop Both Eyes BID   budesonide  0.5 mg Nebulization BID   dexamethasone (DECADRON) injection  4 mg Intravenous Q6H   divalproex  500 mg Oral Q12H   latanoprost  1 drop Both Eyes QHS   methylphenidate  5 mg Oral BID WC   Continuous Infusions:   LOS: 8 days   Signature  Lala Lund M.D on 05/09/2021 at 10:04 AM   -  To page go to [email or web address]

## 2021-05-09 NOTE — Progress Notes (Signed)
[facility] ([facility]) Hospital Liaison note.   ° °[facility] is unable to offer a room today. Hospice liaison will update with any change. Please do not hesitate to call with questions.     °A Please do not hesitate to call with questions.     ° °Thank you,     °Mary Anne Robertson, RN, CCM        °[facility] Hospital Liaison (listed on AMION under Hospice /[facility])      °[phone]  °

## 2021-05-09 NOTE — Progress Notes (Signed)
[name] to be D/C'd  [facility]  per MD order.  Discussed with the patient and all questions fully answered.  VSS, Skin clean, dry and intact without evidence of skin break down, no evidence of skin tears noted. IV catheter discontinued intact. Site without signs and symptoms of complications. Dressing and pressure applied.  An After Visit Summary was printed and given to the patient. Patient received prescription.  D/c education completed with patient/family including follow up instructions, medication list, d/c activities limitations if indicated, with other d/c instructions as indicated by MD - patient able to verbalize understanding, all questions fully answered.   Patient instructed to return to ED, call 911, or call MD for any changes in condition.   Patient escorted via [place], and D/C home via private auto.  Shannon Horton [name] 05/09/2021 6:42 PM

## 2021-05-09 NOTE — Discharge Summary (Signed)
Shannon Horton. HAL:[id number] DOB: 03-24-1946 DOA: 05/01/2021  PCP: Laurey Morale, MD  Admit date: 05/01/2021  Discharge date: 05/09/2021  Admitted From: [facility]  Disposition:  Residential Hospice   Recommendations for Outpatient Follow-up:   Follow up with PCP in 1-2 weeks  PCP Please obtain BMP/CBC, 2 view CXR in 1week,  (see Discharge instructions)   PCP Please follow up on the following pending results:    Home Health: None Equipment/Devices: None  Consultations: Neurology, neurooncology, palliative care, oncology Discharge Condition: Guarded   CODE STATUS: DNR Diet Recommendation: Soft diet for comfort with full aspiration precautions and feeding assistance  Diet Order             DIET DYS 3 Room service appropriate? No; Fluid consistency: Thin  Diet effective now                    CC - Seizure    Brief history of present illness from the day of admission and additional interim summary      Shannon Wolters. is a 75 y.o. 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  Where he had an episode of loss of consciousness, Code stroke was called, CT head showed new intracranial bleeding of a 5 mm area of hypodensity in the right temporal lobe.  So patient was admitted for further work-up.   To initial aggressive treatment there was not much improvement in his clinical condition, palliative care team was involved, after multiple detailed discussions between treatment team family and patient it was decided that he would be transition to full comfort care/hospice.                                                                  Hospital Course    Detailed bedside DW Social work, son, wife and patient, they are very clear - full comfort measures >> Hospice now.  Goal of care now full comfort, stop telemetry and other monitoring, stop all non comfort medications.  Look for residential hospice versus nursing home with palliative care/hospice.       Other medical problems addressed this admission prior to full comfort measures are below.       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 Now with AMS due to new hemorrhagic metastasis in the right temporal lobe - patient is fluctuating,  likely due to underlying hemorrhagic mass and small left ventricular hemorrhage.  Neurology saw the patient and he is currently on steroids, Depakote , now goal of care full comfort and transitioned to hospice..     Metastatic melanoma to brain, adrenal gland and lungs, with brain metastasis status post craniotomy resection of right frontal tumor 03/31/2021. - He was seen by Dr. Lindi Adie last admission and this admission along with neuro oncologist Dr. Mickeal Skinner, family did not wish any invasive procedures for testing or therapeutics transition to hospice.     HTN - Blood pressure slightly elevated added Norvasc.    Hyponatremia -Hydrochlorothiazide has been stopped, on comfort meds only.    Asthma -Stable   Seizure -Continue Depakote   GI prophylaxis -Continue PPI   Discharge diagnosis     Active Problems:   IVH (intraventricular hemorrhage) ([place])   Intracranial hemorrhage [facility])    Discharge instructions    Discharge Instructions     Discharge  instructions   Complete by: As directed    Disposition.  Residential hospice Condition.  Guarded CODE STATUS.  DNR Activity.  With assistance as tolerated, full fall precautions. Diet.  Soft with feeding assistance and aspiration precautions. Goal of care.  Comfort.       Discharge Medications   Allergies as of 05/09/2021       Reactions   Banana Rash        Medication List     STOP taking these medications    HYDROcodone-acetaminophen 5-325 MG tablet Commonly known as: NORCO/VICODIN   levETIRAcetam 250 MG tablet Commonly known as: KEPPRA   methylphenidate 5 MG tablet Commonly known as: RITALIN   sodium chloride 1 g tablet   telmisartan-hydrochlorothiazide 80-25 MG tablet Commonly known as: MICARDIS HCT       TAKE these medications    albuterol 108 (90 Base) MCG/ACT inhaler Commonly known as: VENTOLIN HFA Inhale 2 puffs into the lungs every 4 (four) hours as needed for wheezing or shortness of breath.   amLODipine 5 MG tablet Commonly known as: NORVASC Take 1 tablet (5 mg total) by mouth daily.   Arnuity Ellipta 200 MCG/ACT Aepb Generic drug: Fluticasone Furoate Inhale 1 puff into the lungs daily.   bimatoprost 0.03 % ophthalmic solution Commonly known as: LUMIGAN Place 1 drop into both eyes at bedtime.   brinzolamide 1 % ophthalmic suspension Commonly known as: AZOPT Place 1 drop into both eyes 2 (two) times daily.   dexamethasone 2 MG tablet Commonly known as: DECADRON Take 1 tablet (2 mg total) by mouth every 12 (twelve) hours. What changed: medication strength   divalproex 500 MG DR tablet Commonly known as: DEPAKOTE Take 1 tablet (500 mg total) by mouth every 12 (twelve) hours.   LORazepam 2 MG/ML concentrated solution Commonly known as: ATIVAN Take 1 mL (2 mg total) by mouth every 4 (four) hours as needed for anxiety, sedation or seizure.   morphine CONCENTRATE 10 MG/0.5ML Soln concentrated solution Take 0.5 mLs (10 mg total) by  mouth every 3 (three) hours as needed for moderate pain or severe pain.   pantoprazole 40 MG tablet Commonly known as: PROTONIX Take 1 tablet (40 mg total) by mouth at bedtime.          Major procedures and Radiology Reports - PLEASE review detailed and final reports thoroughly  -        DG Chest 2 View  Result Date: 04/17/2021 CLINICAL DATA:  Cough EXAM: CHEST - 2 VIEW COMPARISON:  04/10/2021,  CT from 03/25/2021 FINDINGS: Cardiac shadow is stable. Mild tortuous thoracic aorta is again seen. Bibasilar airspace opacity is noted slightly improved when compared with the prior exam. Fullness of the right hilum is noted related to vessel on end. Stable right upper lobe nodule is noted seen on previous CT. The known left upper lobe nodule is not as well appreciated on this exam as on prior CT. No sizable effusion is seen. No acute bony abnormality is noted. IMPRESSION: Improving bibasilar airspace opacity. Stable right upper lobe nodule unchanged from prior CT. The known left upper lobe sub solid nodule is not as well appreciated on this exam. Electronically Signed   By: Inez Catalina M.D.   On: 04/17/2021 15:33   DG Chest 2 View  Result Date: 04/10/2021 CLINICAL DATA:  Follow-up of pneumonia EXAM: CHEST - 2 VIEW COMPARISON:  1 day prior FINDINGS: Osteopenia. Hyperinflation. The Chin overlies the apices. Midline trachea. Normal heart size. Atherosclerosis in the transverse aorta. No pleural effusion or pneumothorax. Left greater than right base airspace disease, slightly increased. 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 IMPRESSION: Slightly increased left greater than right base airspace disease, suspicious for infection or aspiration. Underlying hyperinflation, suggesting COPD. Aortic Atherosclerosis (ICD10-I70.0). Electronically Signed   By: Abigail Miyamoto M.D.   On: 04/10/2021 08:34   CT HEAD WO CONTRAST  Addendum Date: 04/18/2021   ADDENDUM REPORT: 04/18/2021 11:50 ADDENDUM: Study  discussed by telephone with Dr. Emelda Brothers on 04/18/2021 at 1139 hours. Electronically Signed   By: Genevie Ann M.D.   On: 04/18/2021 11:50   Result Date: 04/18/2021 CLINICAL DATA:  75 year old male with unexplained altered mental status. Right basal ganglia tumor resection earlier this month. Pathology revealing metastatic melanoma. EXAM: CT HEAD WITHOUT CONTRAST TECHNIQUE: Contiguous axial images were obtained from the base of the skull through the vertex without intravenous contrast. COMPARISON:  Postoperative head CT 04/09/2021 and earlier. FINDINGS: Brain: There is a small new volume of intraventricular hemorrhage layering within the left occipital horn on series 3, image 18. Right ventricle size appears slightly larger than before, but this might be related to decreased right hemisphere edema. No definite ventriculomegaly or transependymal edema. But no other acute intracranial hemorrhage is identified. Furthermore, MRI appearance on 04/01/2021 was suspicious for a small left choroid a plexus metastasis (same image as above). Stable CT appearance of postoperative changes in the right hemisphere tracking to the right lentiform. Regressed edema since 04/01/2021. No new intracranial mass or mass effect is evident. Normal basilar cisterns. No cortically based acute infarct identified. Vascular: Mild Calcified atherosclerosis at the skull base. No suspicious intracranial vascular hyperdensity. Skull: Stable right frontotemporal craniotomy. No acute osseous abnormality identified. Sinuses/Orbits: Visualized paranasal sinuses and mastoids are clear. Other: Regressed scalp postoperative changes. Orbits soft tissues remain negative. A small volume of right retro maxillary soft tissue gas has decreased and may be postoperative. IMPRESSION: 1. Positive for a small volume of acute left occipital horn IVH likely secondary to the small left choroid plexus metastasis suspected by MRI. No definite acute ventriculomegaly or  transependymal edema. 2. Satisfactory noncontrast CT postoperative appearance of the right lentiform resection cavity. Electronically Signed: By: Genevie Ann M.D. On: 04/18/2021 11:28   MR BRAIN W WO CONTRAST  Result Date: 05/01/2021 CLINICAL DATA:  Stroke follow-up EXAM: MRI HEAD WITHOUT AND WITH CONTRAST TECHNIQUE: Multiplanar, multiecho pulse sequences of the brain and surrounding structures were obtained without and with intravenous contrast. CONTRAST:  6.57mL GADAVIST GADOBUTROL 1 MMOL/ML IV  SOLN COMPARISON:  04/19/2021 FINDINGS: Brain: Unchanged area of diffusion abnormality the right frontal lobe at the resection site. There is decreased contrast enhancement in this region since the prior study. Enhancing focus of the right caudate head is decreased in size, now measuring 9 x 3 mm, previously 15 x 7 mm. There are multiple chronic microhemorrhages in a predominantly peripheral distribution. There is also hemosiderin deposition at the right basal ganglia. These findings unchanged. Hyperintense T2-weighted signal is widespread throughout the white matter. Diffuse, severe atrophy. The midline structures are normal. 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 There is a 4 mm contrast enhancing lesion of the anterior left insula (21:28), slightly larger than on the previous study. Vascular: Major flow voids are preserved. Skull and upper cervical spine: Remote right frontal craniotomy. Sinuses/Orbits:No paranasal sinus fluid levels or advanced mucosal thickening. No mastoid or middle ear effusion. Normal orbits. IMPRESSION: 1. Slightly larger 4 mm metastatic lesion of the anterior left insula. 2. Decreased size of enhancing focus at the right caudate head. 3. 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 4. Decreased contrast enhancement at the right frontal resection site, likely resolving postoperative change. Electronically Signed   By: Ulyses Jarred  M.D.   On: 05/01/2021 20:53   MR BRAIN W WO CONTRAST  Result Date: 04/19/2021 CLINICAL DATA:  Mental status change. EXAM: MRI HEAD WITHOUT AND WITH CONTRAST TECHNIQUE: Multiplanar, multiecho pulse sequences of the brain and surrounding structures were obtained without and with intravenous contrast. CONTRAST:  42mL GADAVIST GADOBUTROL 1 MMOL/ML IV SOLN COMPARISON:  CT head April 18, 2021.  MRI head April 01, 2021. FINDINGS: Brain: Postoperative changes of right frontal craniotomy and mass resection. Peripheral enhancement and restricted diffusion along the resection cavity likely represents devitalized tissue. Decreased surrounding edema and regional mass effect with decreased conspicuity of blood products and developing encephalomalacia. Resolution of previously seen midline shift. Mild increase in ventricular size relative to prior MRI. As seen on recent CT head, acute left occipital horn intraventricular hemorrhage. Enhancing left choroid plexus lesion, which appears slightly increased in size (approximately 8 mm on series 20, image 17, previously 6 mm when remeasured) with more homogeneous enhancement. Additional more focal area of enhancement and restricted diffusion in the more superomedial corona radiata/caudate correlates with previously seen acute infarct in is compatible with evolving/subacute infarct. This region of enhancement partially obscures the previously seen right caudate enhancing lesion. Additional mild to moderate scattered T2/FLAIR hyperintensities within the white matter, likely related to chronic microvascular ischemic disease. Vascular: Major arterial flow voids are maintained at the skull base. Skull and upper cervical spine: Normal marrow signal. Sinuses/Orbits: Largely clear sinuses.  Unremarkable orbits. Other: No mastoid effusions. IMPRESSION: 1. Acute left occipital horn intraventricular hemorrhage, similar to recent CT head and increased from prior MRI. This is most likely secondary  to the suspected hemorrhagic left choroid plexus metastasis, which may be slightly increased in size/bulk relative to prior MRI (approximately 8 mm versus 6 mm when remeasured) with more homogeneous enhancement. 2. Mild increase in ventricular size relative to prior MRI, which is likely in part related to atrophy and decreasing mass effect from the resected right frontal lobe mass. In the setting of intraventricular hemorrhage, recommend follow-up CT to ensure stability and exclude developing hydrocephalus. 3. Evolving changes of right basal ganglia tumor resection with decreased surrounding edema and mass effect. Resolution of midline shift. Irregular enhancement along the periphery of the resection cavity  may represent evolving postoperative changes but warrants attention on follow-up. 4. Increased enhancement in the region of recently seen infarct superomedial to the resection cavity, most likely enhancing subacute infarct given findings on the prior MRI. This limits assessment for progressive tumor in this region and partially obscures the right caudate lesion. Recommend attention on short interval follow-up to ensure resolution and to better assess. Electronically Signed   By: Margaretha Sheffield MD   On: 04/19/2021 08:09   EEG adult  Result Date: 05/01/2021 Lora Havens, MD     05/01/2021  5:16 PM Patient Name: Shannon Horton. MRN: [id number] Epilepsy Attending: Lora Havens Referring Physician/Provider: Anibal Henderson, NP Date: 05/01/2021 Duration: 22.25 mins Patient history: 75yo M with acute unresponsiveness, right facial weakness, left hemiparesis. EEG to evaluate for seizure Level of alertness:  lethargic AEDs during EEG study: Ativan, LEV Technical aspects: This EEG study was done with scalp electrodes positioned according to the 10-20 International system of electrode placement. Electrical activity was acquired at a sampling rate of 500Hz  and reviewed with a high frequency filter of 70Hz  and a  low frequency filter of 1Hz . EEG data were recorded continuously and digitally stored. Description: EEG showed continuous generalized and maximal right frontal polymorphic mixed frequencies with predominantly 5 to 6 Hz theta as well as intermittent 2-3Hz  delta slowing admixed with 15-18Hz  generalized beta activity. Hyperventilation and photic stimulation were not performed.   ABNORMALITY - Continuous slow, generalized and maximal right frontal IMPRESSION: This study is suggestive of cortical dysfunction arising from left frontal region, likely secondary to underlying structural abnormality. Additionally there is moderate to severe diffuse encephalopathy, non specific etiology. No seizures or definite epileptiform discharges were seen throughout the recording. Priyanka [name]   Overnight EEG with video  Result Date: 05/05/2021 Lora Havens, MD     05/06/2021  9:41 AM Patient Name: Shannon Horton. MRN: [id number] Epilepsy Attending: Lora Havens Referring Physician/Provider: Dr Kathrynn Speed Duration: 05/04/2021 1210 to 05/05/2021 1210  Patient history: 75yo M with acute unresponsiveness, right facial weakness, left hemiparesis. EEG to evaluate for seizure  Level of alertness:  awake, asleep  AEDs during EEG study: LEV  Technical aspects: This EEG study was done with scalp electrodes positioned according to the 10-20 International system of electrode placement. Electrical activity was acquired at a sampling rate of 500Hz  and reviewed with a high frequency filter of 70Hz  and a low frequency filter of 1Hz . EEG data were recorded continuously and digitally stored.  Description:The posterior dominant rhythm consists of 8-9 Hz activity of moderate voltage (25-35 uV) seen predominantly in posterior head regions, symmetric and reactive to eye opening and eye closing. Sleep was characterized by vertex waves, sleep spindles (12 to 14 Hz), maximal frontocentral region. EEG showed continuous right frontal 5 to 6  Hz theta as well as intermittent 2-3Hz  delta slowing. Lateralized periodic discharges were noted in right frontal region at 1 Hz without any evolution lasting about 5 to 10 seconds each time, predominantly when patient was awake/stimulated.  Event button was pressed on 05/05/2021 at 1118. Per team, patient was communicating and suddenly became less responsive, possibly staring off and tapping his nose with left hand/ hand automatism (unable to view on camera as position) on the patient). Concomitant EEG showed lateralized periodic discharges in the right frontal region at 1 Hz with evolution in space to involve left temporal region with no significant change in frequency or morphology. Hyperventilation and photic stimulation were not  performed.    ABNORMALITY - Continuous slow, right frontal region - Lateralized periodic discharges, right frontal region  IMPRESSION: This study showed evidence of epileptogenicity and cortical dysfunction arising from right frontal region, likely secondary to underlying structural abnormality/mass. One event was recorded on 05/05/2021 at 1118 during which patient was less responsive and possibly had hand automatisms. Concomitant EEG showed lateralized periodic discharges in right frontal region at 1 Hz with evolution in the left temporal region.  Even though ictal changes were not definitively seen on EEG, given clinical correlate it is very likely that this was a focal seizure. Lora Havens   CT HEAD CODE STROKE WO CONTRAST  Result Date: 05/01/2021 CLINICAL DATA:  Code stroke. EXAM: CT HEAD WITHOUT CONTRAST TECHNIQUE: Contiguous axial images were obtained from the base of the skull through the vertex without intravenous contrast. COMPARISON:  CT 04/18/2021, MRI 04/19/2021 FINDINGS: Brain: There is a new 5 mm area of hyperdensity of the right temporal lobe (series 3, image 16). Punctate area of corresponding enhancement is noted on the prior MRI. Evolving postoperative changes in  the right lentiform nucleus region. Adjacent low-attenuation extending to the right frontal horn likely corresponds to area of subacute infarction on MRI. Ill-defined hyperdensity could reflect petechial hemorrhage. Stable left choroid plexus lesion. Small volume residual hemorrhage within the occipital horns. Thin subdural hygroma along the right falx has increased since the MRI. No new loss of gray-white differentiation. Prominence of the ventricles and sulci reflects generalized cerebral and cerebellar parenchymal volume loss. Superimposed communicating hydrocephalus is not excluded. Vascular: No hyperdense vessel. Skull: Right frontal craniotomy. Sinuses/Orbits: No acute abnormality. Other: Mastoid air cells are clear. IMPRESSION: New 5 mm area of hyperdensity in the right temporal lobe. There is punctate corresponding enhancement on the prior MRI and this may reflect hemorrhage associated with a metastasis. Small volume residual hemorrhage within the occipital horns. Increased thin subdural hygroma along the right falx. Evolving postoperative changes and adjacent subacute infarction. Initial results were communicated to Dr. Rory Percy at 3:21 pm on 05/01/2021 by text page via the [facility] messaging system. Electronically Signed   By: Macy Mis M.D.   On: 05/01/2021 15:29    Micro Results     No results found for this or any previous visit (from the past 240 hour(s)).  Today   Subjective   Patient in bed sleeping and appears to be in no distress     Objective   Blood pressure 103/68, pulse 75, temperature 97.6 F (36.4 C), temperature source Axillary, resp. rate 16, height 5\' 8"  (1.727 m), weight 63.5 kg, SpO2 97 %.   Intake/Output Summary (Last 24 hours) at 05/09/2021 1356 Last data filed at 05/08/2021 2145 Gross per 24 hour  Intake 0 ml  Output 1000 ml  Net -1000 ml    Exam   Patient in bed resting appears to be in no distress, overall appears quite lethargic and weak. Abdomen is soft  nontender CTA B     Data Review   CBC w Diff:  Lab Results  Component Value Date   WBC 11.1 (H) 05/07/2021   HGB 15.9 05/07/2021   HCT 44.4 05/07/2021   PLT 198 05/07/2021   LYMPHOPCT 17 05/02/2021   MONOPCT 8 05/02/2021   EOSPCT 0 05/02/2021   BASOPCT 1 05/02/2021    CMP:  Lab Results  Component Value Date   NA 133 (L) 05/07/2021   K 3.5 05/07/2021   CL 96 (L) 05/07/2021   CO2 24 05/07/2021  BUN 31 (H) 05/07/2021   CREATININE 0.74 05/07/2021   CREATININE 0.82 09/10/2020   PROT 5.6 (L) 04/22/2021   ALBUMIN 3.3 (L) 04/22/2021   BILITOT 1.1 04/22/2021   ALKPHOS 66 04/22/2021   AST 18 04/22/2021   ALT 33 04/22/2021  .   Total Time in preparing paper work, data evaluation and todays exam - 59 minutes  Lala Lund M.D on 05/09/2021 at 1:56 PM  [facility]

## 2021-05-31 DEATH — deceased

## 2021-07-10 ENCOUNTER — Other Ambulatory Visit: Payer: Self-pay | Admitting: Family Medicine

## 2021-08-06 ENCOUNTER — Other Ambulatory Visit: Payer: Self-pay | Admitting: Family Medicine

## 2021-08-23 NOTE — Progress Notes (Deleted)
Assessment/Plan:   1.  Parkinsons Disease  -***   Subjective:   [name] was seen today in follow up for bradykinesia.  My previous records were reviewed prior to todays visit as well as outside records available to me.  Last visit, we decided to go ahead and order a DaTscan.  Not long thereafter, the patient's wife called me and stated that the patient started to have tremor and falling and was rapidly losing weight.  MRI ended up being performed of the brain demonstrating hemorrhagic right basal ganglia mass.  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  Patient underwent tumor resection March 31, 2021.  Patient in the hospital in July and placed on Keppra, due to concern for seizure due to brain mets.  EEG done on July 6 demonstrated lateralized periodic discharges from the right frontal region.  Depakote was added following this EEG and clinical correlate of likely seizure.  Patient was discharged to residential hospice on July 10.  Current prescribed movement disorder medications: ***   PREVIOUS MEDICATIONS: {Parkinson's RX:18200}  ALLERGIES:   Allergies  Allergen Reactions   Banana Rash    CURRENT MEDICATIONS:  Outpatient Encounter Medications as of 08/25/2021  Medication Sig   albuterol (PROVENTIL HFA;VENTOLIN HFA) 108 (90 Base) MCG/ACT inhaler Inhale 2 puffs into the lungs every 4 (four) hours as needed for wheezing or shortness of breath.   amLODipine (NORVASC) 5 MG tablet Take 1 tablet (5 mg total) by mouth daily.   bimatoprost (LUMIGAN) 0.03 % ophthalmic solution Place 1 drop into both eyes at bedtime.   brinzolamide (AZOPT) 1 % ophthalmic suspension Place 1 drop into both eyes 2 (two) times daily.   dexamethasone (DECADRON) 2 MG tablet Take 1 tablet (2 mg total) by mouth every 12 (twelve) hours.   divalproex (DEPAKOTE) 500 MG DR tablet Take 1 tablet (500 mg total) by mouth every 12 (twelve)  hours.   Fluticasone Furoate (ARNUITY ELLIPTA) 200 MCG/ACT AEPB Inhale 1 puff into the lungs daily.   LORazepam (ATIVAN) 2 MG/ML concentrated solution Take 1 mL (2 mg total) by mouth every 4 (four) hours as needed for anxiety, sedation or seizure.   Morphine Sulfate (MORPHINE CONCENTRATE) 10 MG/0.5ML SOLN concentrated solution Take 0.5 mLs (10 mg total) by mouth every 3 (three) hours as needed for moderate pain or severe pain.   pantoprazole (PROTONIX) 40 MG tablet Take 1 tablet (40 mg total) by mouth at bedtime.   No facility-administered encounter medications on file as of 08/25/2021.    Objective:   PHYSICAL EXAMINATION:    VITALS:  There were no vitals filed for this visit.  GEN:  The patient appears stated age and is in NAD. HEENT:  Normocephalic, atraumatic.  The mucous membranes are moist. The superficial temporal arteries are without ropiness or tenderness. CV:  RRR Lungs:  CTAB Neck/HEME:  There are no carotid bruits bilaterally.  Neurological examination:  Orientation: The patient is alert and oriented x3. Cranial nerves: There is good facial symmetry with*** facial hypomimia. The speech is fluent and clear. Soft palate rises symmetrically and there is no tongue deviation. Hearing is intact to conversational tone. Sensation: Sensation is intact to light touch throughout Motor: Strength is at least antigravity x4.  Movement examination: Tone: There is ***tone in the *** Abnormal movements: *** Coordination:  There is *** decremation with RAM's, *** Gait and Station: The patient has *** difficulty arising out  of a deep-seated chair without the use of the hands. The patient's stride length is ***.  The patient has a *** pull test.     I have reviewed and interpreted the following labs independently    Chemistry      Component Value Date/Time   NA 133 (L) 05/07/2021 0153   K 3.5 05/07/2021 0153   CL 96 (L) 05/07/2021 0153   CO2 24 05/07/2021 0153   BUN 31 (H)  05/07/2021 0153   CREATININE 0.74 05/07/2021 0153   CREATININE 0.82 09/10/2020 1045      Component Value Date/Time   CALCIUM 9.3 05/07/2021 0153   ALKPHOS 66 04/22/2021 0433   AST 18 04/22/2021 0433   ALT 33 04/22/2021 0433   BILITOT 1.1 04/22/2021 0433       Lab Results  Component Value Date   WBC 11.1 (H) 05/07/2021   HGB 15.9 05/07/2021   HCT 44.4 05/07/2021   MCV 90.2 05/07/2021   PLT 198 05/07/2021    Lab Results  Component Value Date   TSH 2.112 04/18/2021     Total time spent on today's visit was ***30 minutes, including both face-to-face time and nonface-to-face time.  Time included that spent on review of records (prior notes available to me/labs/imaging if pertinent), discussing treatment and goals, answering patient's questions and coordinating care.  Cc:  Laurey Morale, MD

## 2021-08-25 ENCOUNTER — Ambulatory Visit: Payer: Medicare PPO | Admitting: Neurology

## 2022-05-22 IMAGING — CT CT BIOPSY
1 of 4 series · 9 of 32 positions shown, 15 images · non-contrast
Comparison: none

INDICATION: 74-year-old gentleman with history of melanoma presents to
interventional radiology for biopsy of right adrenal mass.

[Series 4: i-spiral 5.0 b40f · axial · 0.78mm/px · z∈[+1172,+1333]mm · 9 of 58 slices shown, 15 images]
[im 6/58  soft-tissue]
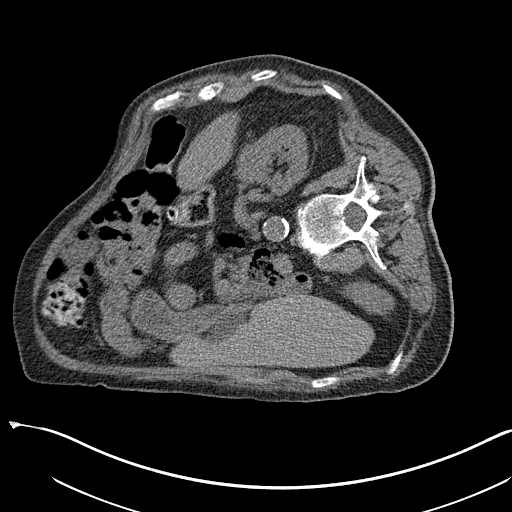
[im 6/58  bone]
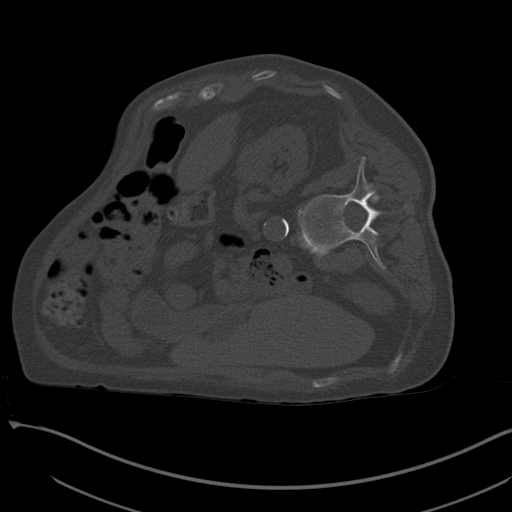
[im 12/58  soft-tissue]
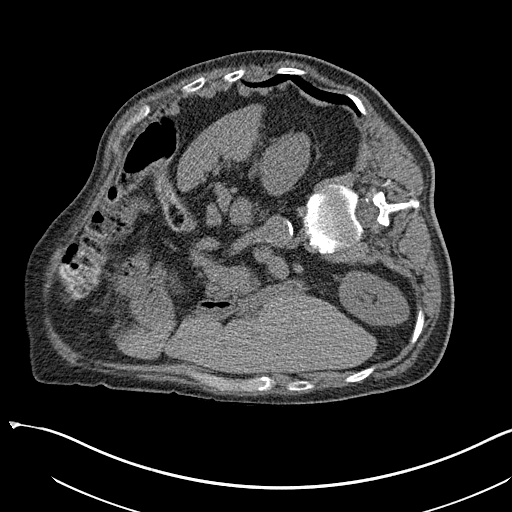
[im 18/58  soft-tissue]
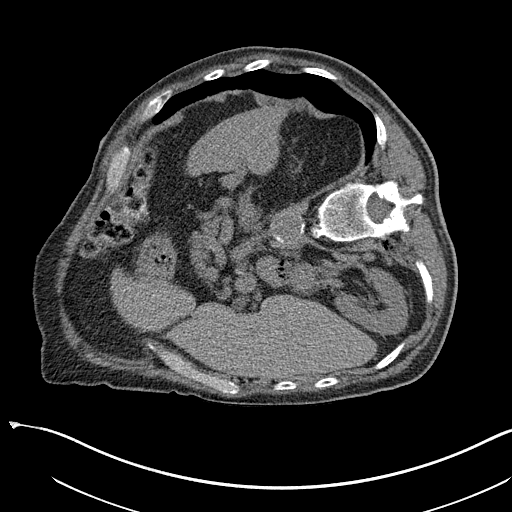
[im 23/58  soft-tissue]
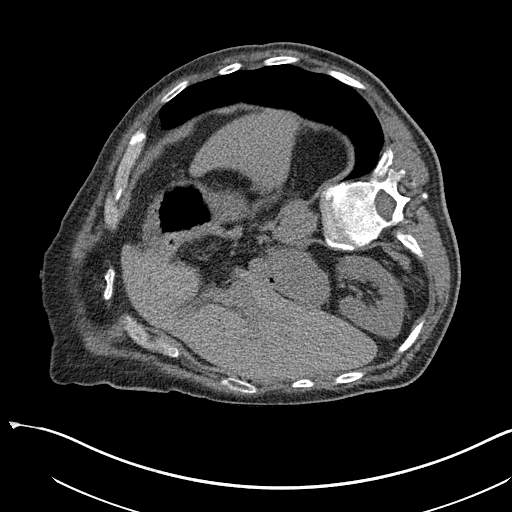
[im 29/58  soft-tissue]
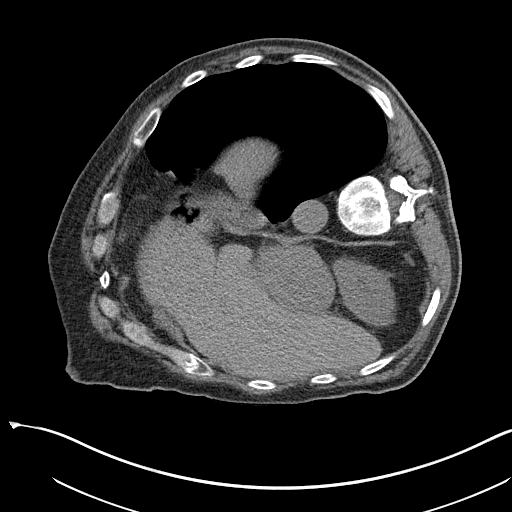
[im 35/58  soft-tissue]
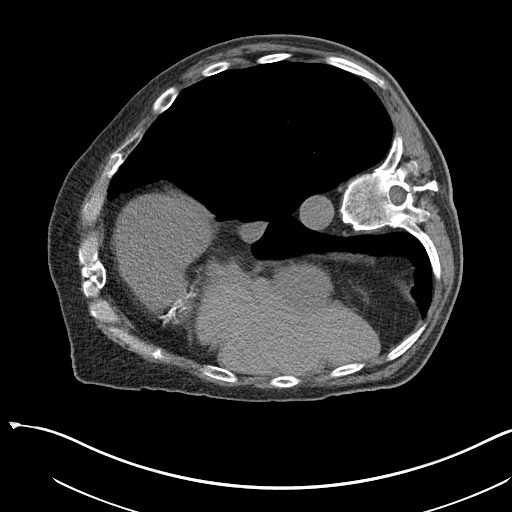
[im 35/58  lung]
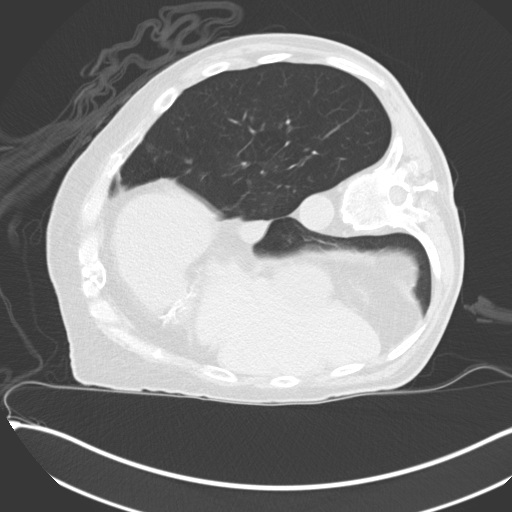
[im 40/58  soft-tissue]
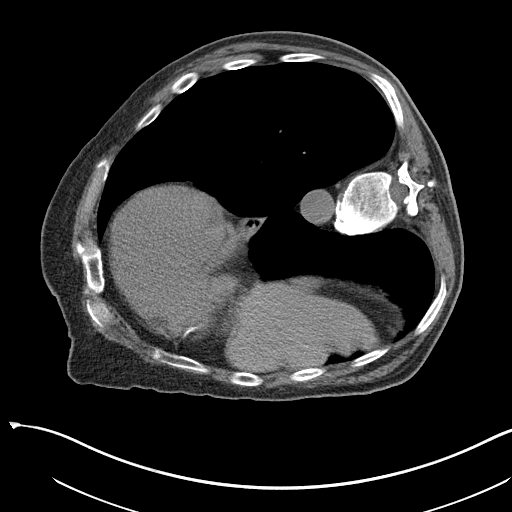
[im 40/58  lung]
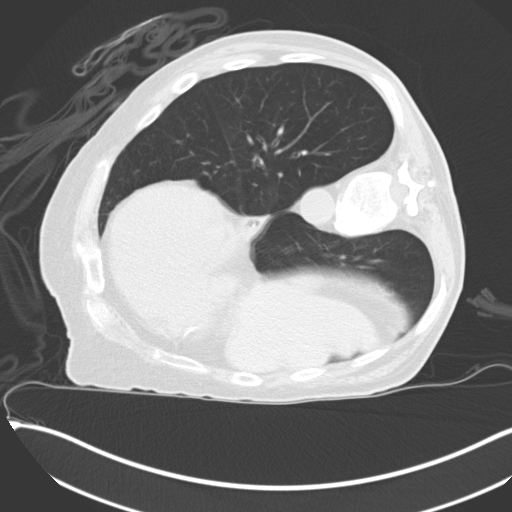
[im 46/58  soft-tissue]
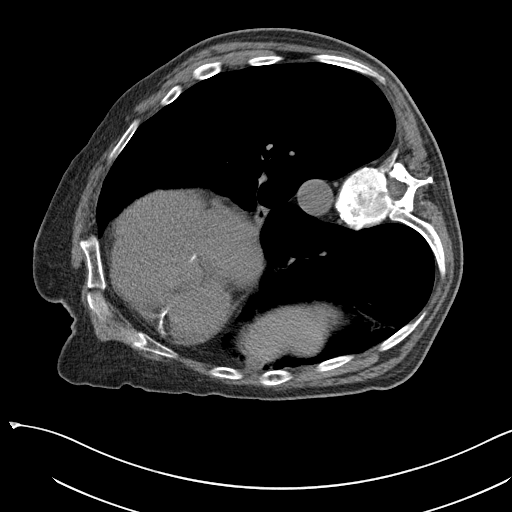
[im 46/58  lung]
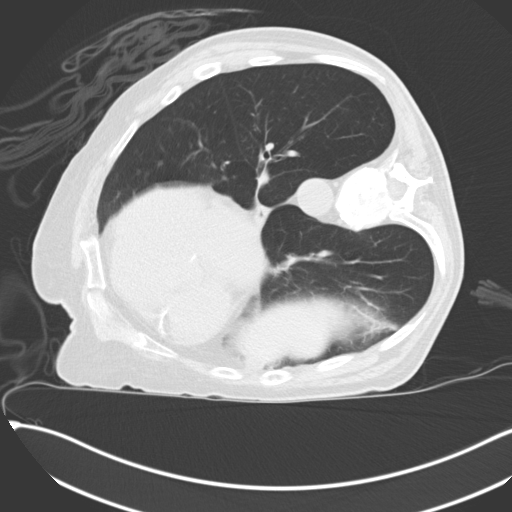
[im 52/58  soft-tissue]
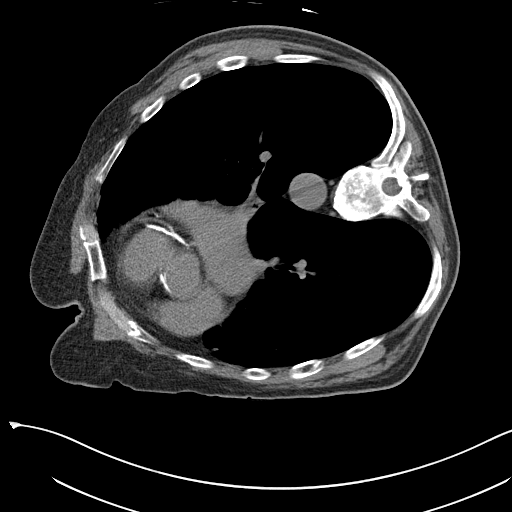
[im 52/58  lung]
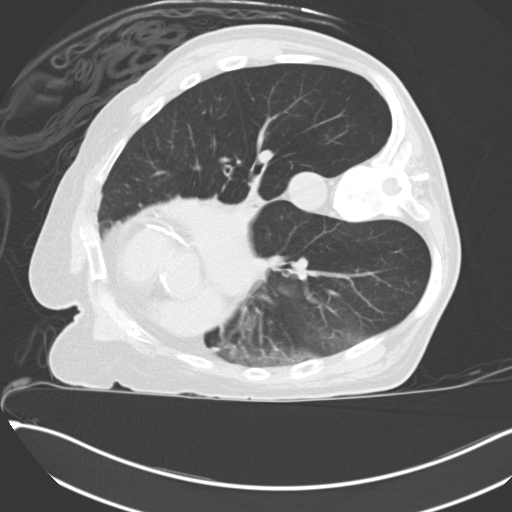
[im 52/58  bone]
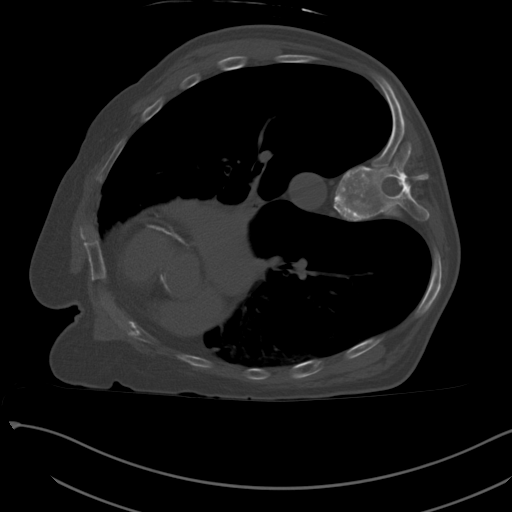

[9 of 32 positions shown; findings below may reference images not displayed]

EXAM:
CT-guided biopsy of right adrenal mass

MEDICATIONS:
None.

ANESTHESIA/SEDATION:
Moderate (conscious) sedation was employed during this procedure. A
total of Versed 1 mg and Fentanyl 25 mcg was administered
intravenously.

Moderate Sedation Time: 17 minutes. The patient's level of
consciousness and vital signs were monitored continuously by
radiology nursing throughout the procedure under my direct
supervision.

COMPLICATIONS:
SIR Level A - No therapy, no consequence.

PROCEDURE:
Informed written consent was obtained from the patient after a
thorough discussion of the procedural risks, benefits and
alternatives. All questions were addressed. Maximal Sterile Barrier
Technique was utilized including caps, mask, sterile gowns, sterile
gloves, sterile drape, hand hygiene and skin antiseptic. A timeout
was performed prior to the initiation of the procedure.

Patient position right lateral decubitus on the CT table. Following
local lidocaine administration, 17 gauge introducer needle was
advanced into the right adrenal mass utilizing CT guidance. 4-18
gauge cores were obtained. Samples were sent to pathology in
formalin.

Postprocedure CT demonstrated mild retroperitoneal hematoma which
did not significantly increased in size on sequential CT. No
pneumothorax identified on postprocedure CT.
IMPRESSION: CT-guided biopsy of right adrenal mass.
